# Patient Record
Sex: Male | Born: 1955
Health system: Southern US, Community
[De-identification: ages and names within clinical notes are randomized; demographics above are authoritative.]

## PROBLEM LIST (undated history)

## (undated) ENCOUNTER — Ambulatory Visit: Source: Home / Self Care

## (undated) DIAGNOSIS — I5031 Acute diastolic (congestive) heart failure: Secondary | ICD-10-CM

## (undated) DIAGNOSIS — M1990 Unspecified osteoarthritis, unspecified site: Secondary | ICD-10-CM

## (undated) DIAGNOSIS — D126 Benign neoplasm of colon, unspecified: Secondary | ICD-10-CM

## (undated) DIAGNOSIS — J439 Emphysema, unspecified: Secondary | ICD-10-CM

## (undated) DIAGNOSIS — E785 Hyperlipidemia, unspecified: Secondary | ICD-10-CM

## (undated) DIAGNOSIS — T7840XA Allergy, unspecified, initial encounter: Secondary | ICD-10-CM

## (undated) DIAGNOSIS — I4891 Unspecified atrial fibrillation: Secondary | ICD-10-CM

## (undated) DIAGNOSIS — I10 Essential (primary) hypertension: Secondary | ICD-10-CM

## (undated) DIAGNOSIS — G4730 Sleep apnea, unspecified: Secondary | ICD-10-CM

## (undated) HISTORY — PX: SPINE SURGERY: SHX786

## (undated) HISTORY — DX: Allergy, unspecified, initial encounter: T78.40XA

## (undated) HISTORY — PX: HERNIA REPAIR: SHX51

## (undated) HISTORY — DX: Unspecified atrial fibrillation: I48.91

## (undated) HISTORY — DX: Emphysema, unspecified: J43.9

## (undated) HISTORY — DX: Hyperlipidemia, unspecified: E78.5

## (undated) HISTORY — PX: WISDOM TOOTH EXTRACTION: SHX21

## (undated) HISTORY — DX: Sleep apnea, unspecified: G47.30

## (undated) HISTORY — DX: Unspecified osteoarthritis, unspecified site: M19.90

## (undated) HISTORY — PX: BACK SURGERY: SHX140

---
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
## 1997-09-02 ENCOUNTER — Emergency Department (HOSPITAL_COMMUNITY): Admission: EM | Admit: 1997-09-02 | Discharge: 1997-09-02 | Payer: Self-pay | Admitting: Emergency Medicine

## 1999-04-28 ENCOUNTER — Encounter: Payer: Self-pay | Admitting: Neurosurgery

## 1999-04-28 ENCOUNTER — Ambulatory Visit (HOSPITAL_COMMUNITY): Admission: RE | Admit: 1999-04-28 | Discharge: 1999-04-28 | Payer: Self-pay | Admitting: Neurosurgery

## 2003-02-12 ENCOUNTER — Inpatient Hospital Stay (HOSPITAL_COMMUNITY): Admission: RE | Admit: 2003-02-12 | Discharge: 2003-02-14 | Payer: Self-pay | Admitting: Neurosurgery

## 2009-05-21 ENCOUNTER — Emergency Department (HOSPITAL_COMMUNITY): Admission: EM | Admit: 2009-05-21 | Discharge: 2009-05-21 | Payer: Self-pay | Admitting: Emergency Medicine

## 2010-04-28 LAB — CBC
HCT: 48.5 % (ref 39.0–52.0)
MCHC: 34.8 g/dL (ref 30.0–36.0)
MCV: 96.3 fL (ref 78.0–100.0)
RBC: 5.03 MIL/uL (ref 4.22–5.81)

## 2010-04-28 LAB — DIFFERENTIAL
Basophils Relative: 0 % (ref 0–1)
Eosinophils Absolute: 0.2 10*3/uL (ref 0.0–0.7)
Eosinophils Relative: 2 % (ref 0–5)
Monocytes Relative: 8 % (ref 3–12)
Neutrophils Relative %: 67 % (ref 43–77)

## 2010-04-28 LAB — POCT CARDIAC MARKERS: Myoglobin, poc: 35.4 ng/mL (ref 12–200)

## 2010-04-28 LAB — BASIC METABOLIC PANEL
BUN: 13 mg/dL (ref 6–23)
CO2: 23 mEq/L (ref 19–32)
Chloride: 107 mEq/L (ref 96–112)
Creatinine, Ser: 0.7 mg/dL (ref 0.4–1.5)
Potassium: 4.1 mEq/L (ref 3.5–5.1)

## 2010-06-25 NOTE — Op Note (Signed)
NAME:  Mitchell Parrish, Mitchell Parrish                       ACCOUNT NO.:  [id number]   MEDICAL RECORD NO.:  [id number]                   PATIENT TYPE:  INP   LOCATION:  3172                                 FACILITY:  [facility]   PHYSICIAN:  Payton Doughty, M.D.                   DATE OF BIRTH:  Sep 05, 1955   DATE OF PROCEDURE:  02/12/2003  DATE OF DISCHARGE:                                 OPERATIVE REPORT   PREOPERATIVE DIAGNOSIS:  Spondylosis with myelopathy at C3-4, C4-5 and C5-6.   POSTOPERATIVE DIAGNOSIS:  Spondylosis with myelopathy at C3-4, C4-5 and C5-  6.   OPERATION PERFORMED:  C3-4, C4-5 and C5-6 anterior cervical decompression  and fusion with a tether plate.   SURGEON:  Payton Doughty, M.D.   ANESTHESIA:  General endotracheal.   PREP:  Sterile Betadine prep and scrub with alcohol wipe.   COMPLICATIONS:  None.   ASSISTANT:  1. Danae Orleans. Venetia Maxon, M.D.  2. [place]ts.   INDICATIONS FOR PROCEDURE:  The patient is a 55 year old gentleman with  cervical spondylitic myelopathy.   DESCRIPTION OF PROCEDURE:  The patient was taken to the operating room,  smoothly anesthetized, intubated, and placed supine on the operating table  in halter head traction.  Following shave, prep and drape in the in the  usual sterile fashion.  The skin was incised paralleling the  sternocleidomastoid muscle from the level of the carotid tubercle up to  nearly the angle of the jaw.  The platysma was identified, elevated, divided  and undermined.  Sternocleidomastoid muscle was then identified and medial  dissection revealed the carotid artery retracted laterally to the left,  trachea and esophagus retracted laterally to the right, exposing the bones  of the anterior cervical spine.  A marker was placed.  Intraoperative x-ray  obtained to confirm correctness of level.  Having confirmed correctness of  level, the longus colli was taken down bilaterally from C3 to C6 and the  Shadowline retractor placed.  While  protecting the esophagus, anterior  cervical decompression was carried out first under gross observation, then  using the operating microscope.  We brought in the operating microscope and  used the microdissection technique to complete resection of the disk at 3-4,  4-5 and 5-6 as well as remove the posterior longitudinal ligament and  explore both neural foramina bilaterally.  At 5-6 there was a central disk  which narrowed the canal.  At 3-4 and 4-5 there was spondylitic disease  slightly worse to the left than to the right.  Having completed  decompression, the wound was irrigated, hemostasis assured.  7 mm bone  grafts were fashioned from patellar allograft and tapped into place.  A 57  mm tether plate for three levels was then placed with two screws in C3, one  in C4, one in C5 and two in C6.  An intraoperative radiograph showed good  placement of bone grafts,  plates and screws.  The wound was once again  irrigated and hemostasis assured.  The platysma was reapproximated with 3-0  Vicryl in interrupted fashion.  Subcutaneous tissue reapproximated with 3-0 Vicryl in interrupted fashion.  Skin was closed with 4-0 Vicryl in a running subcuticular fashion.  Benzoin  and Steri-Strips were placed and made occlusive with Telfa and OpSite.  The  patient was then transferred to the recovery room in good condition.                                               Payton Doughty, M.D.    MWR/MEDQ  D:  02/12/2003  T:  02/13/2003  Job:  [id number]

## 2010-06-25 NOTE — H&P (Signed)
NAME:  Mitchell Parrish, Mitchell Parrish                       ACCOUNT NO.:  [id number]   MEDICAL RECORD NO.:  [id number]                   PATIENT TYPE:  INP   LOCATION:  3172                                 FACILITY:  [facility]   PHYSICIAN:  Payton Doughty, M.D.                   DATE OF BIRTH:  [birth year]   DATE OF ADMISSION:  02/12/2003  DATE OF DISCHARGE:                                HISTORY & PHYSICAL   ADMISSION DIAGNOSIS:  Spondylosis C3-C4, C4-C5, C5-C6.   SERVICE:  Neurosurgery   BODY OF TEXT:  This is a 55 year old right handed white gentleman who  underwent an anterior cervical discectomy and fusion at C6-C7 in 1993.  He  has done reasonably well.  Over the past couple of years, he has had some  increased pain in his neck and his left shoulder and arm.  MRI demonstrated  significant spondylitic disease and spinal stenosis at C3-C4 and C4-C5 and  C5-C6, he had a central and left sided narrowing.  He is now admitted for  fusion.   MEDICAL HISTORY:  Unremarkable for significant diseases.  He is allergic to  LODINE.  He has had an anterior cervical in the past at C6-C7 for surgical  history.   FAMILY HISTORY:  Noncontributory.   REVIEW OF SYMPTOMS:  Remarkable for neck and left shoulder pain.   SOCIAL HISTORY:  Drinks beer on occasion and smokes 1 1/2 packs cigarettes  per day.  He is a Naval architect.   PHYSICAL EXAMINATION:  HEENT:  Within normal limits.  NECK:  He has limited range of motion which causes neck pain.  LUNGS:  Clear.  HEART:  Regular rate and rhythm.  ABDOMEN:  Nontender, no hepatosplenomegaly.  EXTREMITIES:  No cyanosis or clubbing.  Peripheral pulses are good.  GU:  Deferred.  NEUROLOGICAL:  He is awake, alert, and oriented.  Cranial nerves are intact.  Motor exam shows 5/5 strength throughout the lower extremities.  The upper  extremities, he has full strength.  He has mildly facilitated reflexes of  the lower extremities.  Hoffman's is positive on the left side and  slightly  positive on the right.  There is no current sensory deficit.   CLINICAL IMPRESSION:  Cervical spondylosis with developing myelopathy.   PLAN:  Anterior cervical discectomy and fusion at C3-C4, C4-C5, and C5-C6.  The risks and benefits of this approach has been discussed with him and he  wishes to proceed.                                                Payton Doughty, M.D.    MWR/MEDQ  D:  02/12/2003  T:  02/12/2003  Job:  [id number]

## 2011-01-22 ENCOUNTER — Emergency Department (HOSPITAL_BASED_OUTPATIENT_CLINIC_OR_DEPARTMENT_OTHER)
Admission: EM | Admit: 2011-01-22 | Discharge: 2011-01-22 | Disposition: A | Discharge status: Home / Self Care | Payer: BC Managed Care – PPO | Attending: Emergency Medicine | Admitting: Emergency Medicine

## 2011-01-22 ENCOUNTER — Encounter: Payer: Self-pay | Admitting: *Deleted

## 2011-01-22 ENCOUNTER — Emergency Department (INDEPENDENT_AMBULATORY_CARE_PROVIDER_SITE_OTHER): Payer: BC Managed Care – PPO

## 2011-01-22 DIAGNOSIS — S6980XA Other specified injuries of unspecified wrist, hand and finger(s), initial encounter: Secondary | ICD-10-CM

## 2011-01-22 DIAGNOSIS — F17200 Nicotine dependence, unspecified, uncomplicated: Secondary | ICD-10-CM | POA: Insufficient documentation

## 2011-01-22 DIAGNOSIS — S6990XA Unspecified injury of unspecified wrist, hand and finger(s), initial encounter: Secondary | ICD-10-CM

## 2011-01-22 DIAGNOSIS — L02519 Cutaneous abscess of unspecified hand: Secondary | ICD-10-CM | POA: Insufficient documentation

## 2011-01-22 DIAGNOSIS — L0390 Cellulitis, unspecified: Secondary | ICD-10-CM

## 2011-01-22 DIAGNOSIS — X58XXXA Exposure to other specified factors, initial encounter: Secondary | ICD-10-CM

## 2011-01-22 HISTORY — DX: Essential (primary) hypertension: I10

## 2011-01-22 MED ORDER — CEPHALEXIN 500 MG PO CAPS: 500.0000 mg | ORAL_CAPSULE | Freq: Four times a day (QID) | ORAL | Status: AC

## 2011-01-22 MED ORDER — CEPHALEXIN 250 MG PO CAPS
500.0000 mg | ORAL_CAPSULE | Freq: Once | ORAL | Status: AC
  Administered 2011-01-22: 500 mg via ORAL
  Filled 2011-01-22: qty 2

## 2011-01-22 MED ORDER — HYDROCODONE-ACETAMINOPHEN 5-500 MG PO TABS: 1.0000 | ORAL_TABLET | Freq: Four times a day (QID) | ORAL | Status: AC | PRN

## 2011-01-22 NOTE — ED Notes (Signed)
Pt states he may have something in his right ring finger. Throbbing. Swollen. Red. Working on fence.

## 2011-01-22 NOTE — ED Provider Notes (Signed)
History     CSN: [id number] Arrival date & time: 01/22/2011  2:16 PM   First MD Initiated Contact with Patient 01/22/11 1423      Chief Complaint  Patient presents with  . Finger Injury    (Consider location/radiation/quality/duration/timing/severity/associated sxs/prior treatment) HPI Comments: Pt states that he grabbed the top of the fence last night and he is not sure if something is in the area:pt states that now the area is red and swollen and painful  Patient is a 55 y.o. male presenting with hand pain. The history is provided by the patient. No language interpreter was used.  Hand Pain This is a new problem. The current episode started yesterday. The problem occurs constantly. The problem has been gradually worsening. He has tried nothing for the symptoms.    Past Medical History  Diagnosis Date  . Hypertension     Past Surgical History  Procedure Date  . Back surgery   . Hernia repair     History reviewed. No pertinent family history.  History  Substance Use Topics  . Smoking status: Current Everyday Smoker  . Smokeless tobacco: Not on file  . Alcohol Use: Yes      Review of Systems  All other systems reviewed and are negative.    Allergies  Lodine and Percocet  Home Medications   Current Outpatient Rx  Name Route Sig Dispense Refill  . BISOPROLOL-HYDROCHLOROTHIAZIDE 10-6.25 MG PO TABS Oral Take 1 tablet by mouth daily.        BP 147/84  Pulse 84  Temp(Src) 97.9 F (36.6 C) (Oral)  Resp 20  Ht 5\' 11"  (1.803 m)  Wt 226 lb (102.513 kg)  BMI 31.52 kg/m2  SpO2 95%  Physical Exam  Nursing note and vitals reviewed. Constitutional: He is oriented to person, place, and time. He appears well-developed and well-nourished.  Cardiovascular: Normal rate and regular rhythm.   Pulmonary/Chest: Effort normal and breath sounds normal.  Musculoskeletal:       Pt has full rom of right ringer finger:cap refill <3 sec:no obvious fb noted  Neurological:  He is alert and oriented to person, place, and time.  Skin:       Pr has redness noted to the distal phalanx of the right ring finger with an superficial laceration noted to the area  Psychiatric: He has a normal mood and affect.    ED Course  Procedures (including critical care time)  Labs Reviewed - No data to display Dg Finger Ring Right  01/22/2011  *RADIOLOGY REPORT*  Clinical Data: Injury.  RIGHT RING FINGER 2+V  Comparison: None.  Findings: There is no evidence of fracture or dislocation.  There is no evidence of arthropathy or other focal bone abnormality. Soft tissues are unremarkable.  IMPRESSION: Negative exam.  Original Report Authenticated By: Rosealee Albee, M.D.     1. Cellulitis       MDM  No sing of fb noted:exam not consistent with a felon:will treat for cellulitis discussed follow up with pt        Teressa Lower, NP 01/22/11 1458

## 2011-01-22 NOTE — ED Provider Notes (Signed)
Medical screening examination/treatment/procedure(s) were performed by non-physician practitioner and as supervising physician I was immediately available for consultation/collaboration.  Iona Stay M Claretta Kendra, MD 01/22/11 2151 

## 2011-08-17 ENCOUNTER — Telehealth: Payer: Self-pay

## 2011-08-17 MED ORDER — BISOPROLOL-HYDROCHLOROTHIAZIDE 10-6.25 MG PO TABS: 1.0000 | ORAL_TABLET | Freq: Every day | ORAL | Status: DC

## 2011-08-17 NOTE — Telephone Encounter (Signed)
Yes.  Med sent.

## 2011-08-17 NOTE — Telephone Encounter (Signed)
PT HAS MADE AN APPT. TO REFILL HIS BP MEDS WITH DR Merla Riches ON 9/3 BUT WAS WONDERING IF WE CAN CALL HIM IN ENOUGH TO LAST HIM. HE USES THE [facility] [address]: [phone]

## 2011-08-17 NOTE — Telephone Encounter (Signed)
Pt.notified

## 2011-09-26 ENCOUNTER — Ambulatory Visit: Payer: PRIVATE HEALTH INSURANCE

## 2011-09-26 ENCOUNTER — Ambulatory Visit (INDEPENDENT_AMBULATORY_CARE_PROVIDER_SITE_OTHER): Payer: PRIVATE HEALTH INSURANCE | Admitting: Internal Medicine

## 2011-09-26 VITALS — BP 126/60 | HR 71 | Temp 98.1°F | Resp 18 | Ht 69.5 in | Wt 222.2 lb

## 2011-09-26 DIAGNOSIS — E669 Obesity, unspecified: Secondary | ICD-10-CM | POA: Insufficient documentation

## 2011-09-26 DIAGNOSIS — I10 Essential (primary) hypertension: Secondary | ICD-10-CM | POA: Insufficient documentation

## 2011-09-26 DIAGNOSIS — R1030 Lower abdominal pain, unspecified: Secondary | ICD-10-CM

## 2011-09-26 DIAGNOSIS — M25559 Pain in unspecified hip: Secondary | ICD-10-CM

## 2011-09-26 DIAGNOSIS — R109 Unspecified abdominal pain: Secondary | ICD-10-CM

## 2011-09-26 DIAGNOSIS — Z6832 Body mass index (BMI) 32.0-32.9, adult: Secondary | ICD-10-CM

## 2011-09-26 DIAGNOSIS — L989 Disorder of the skin and subcutaneous tissue, unspecified: Secondary | ICD-10-CM

## 2011-09-26 DIAGNOSIS — Z720 Tobacco use: Secondary | ICD-10-CM | POA: Insufficient documentation

## 2011-09-26 LAB — POCT URINALYSIS DIPSTICK
Bilirubin, UA: NEGATIVE
Blood, UA: NEGATIVE
Ketones, UA: NEGATIVE
Protein, UA: NEGATIVE
Spec Grav, UA: 1.02
pH, UA: 7

## 2011-09-26 LAB — POCT UA - MICROSCOPIC ONLY
Bacteria, U Microscopic: NEGATIVE
Crystals, Ur, HPF, POC: NEGATIVE
Epithelial cells, urine per micros: NEGATIVE
Mucus, UA: NEGATIVE
RBC, urine, microscopic: NEGATIVE
WBC, Ur, HPF, POC: NEGATIVE

## 2011-09-26 MED ORDER — MELOXICAM 15 MG PO TABS: 15.0000 mg | ORAL_TABLET | Freq: Every day | ORAL | Status: AC

## 2011-09-26 NOTE — Progress Notes (Signed)
VCO. Local anesthesia with 2% lidocaine with epinephrine.  Lesion on left elbow shaved flat. Hemostasis obtained with silver nitrate. Bandage applied. Patient tolerated well.

## 2011-09-26 NOTE — Progress Notes (Signed)
  Subjective:    Patient ID: Mitchell Parrish, male    DOB: 07-22-1955, 56 y.o.   MRN: [id number]  HPI    Review of Systems     Objective:   Physical Exam        Results for orders placed in visit on 09/26/11  POCT UA - MICROSCOPIC ONLY      Component Value Range   WBC, Ur, HPF, POC neg     RBC, urine, microscopic neg     Bacteria, U Microscopic neg     Mucus, UA neg     Epithelial cells, urine per micros neg     Crystals, Ur, HPF, POC neg     Casts, Ur, LPF, POC neg     Yeast, UA neg    POCT URINALYSIS DIPSTICK      Component Value Range   Color, UA yellow     Clarity, UA clear     Glucose, UA neg     Bilirubin, UA neg     Ketones, UA neg     Spec Grav, UA 1.020     Blood, UA neg     pH, UA 7.0     Protein, UA neg     Urobilinogen, UA 4.0     Nitrite, UA neg     Leukocytes, UA Negative      Assessment & Plan:
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
## 2011-09-27 DIAGNOSIS — L821 Other seborrheic keratosis: Secondary | ICD-10-CM | POA: Insufficient documentation

## 2011-10-03 ENCOUNTER — Encounter: Payer: Self-pay | Admitting: Internal Medicine

## 2011-10-12 ENCOUNTER — Encounter: Payer: Self-pay | Admitting: Internal Medicine

## 2011-11-02 ENCOUNTER — Encounter: Payer: Self-pay | Admitting: Internal Medicine

## 2011-11-02 ENCOUNTER — Ambulatory Visit (INDEPENDENT_AMBULATORY_CARE_PROVIDER_SITE_OTHER): Payer: PRIVATE HEALTH INSURANCE | Admitting: Internal Medicine

## 2011-11-02 VITALS — BP 138/76 | HR 70 | Temp 98.8°F | Resp 16 | Ht 69.0 in | Wt 228.8 lb

## 2011-11-02 DIAGNOSIS — Z0000 Encounter for general adult medical examination without abnormal findings: Secondary | ICD-10-CM

## 2011-11-02 DIAGNOSIS — R209 Unspecified disturbances of skin sensation: Secondary | ICD-10-CM

## 2011-11-02 DIAGNOSIS — I10 Essential (primary) hypertension: Secondary | ICD-10-CM

## 2011-11-02 DIAGNOSIS — M25559 Pain in unspecified hip: Secondary | ICD-10-CM

## 2011-11-02 DIAGNOSIS — F17200 Nicotine dependence, unspecified, uncomplicated: Secondary | ICD-10-CM

## 2011-11-02 DIAGNOSIS — R200 Anesthesia of skin: Secondary | ICD-10-CM

## 2011-11-02 DIAGNOSIS — R195 Other fecal abnormalities: Secondary | ICD-10-CM

## 2011-11-02 LAB — COMPREHENSIVE METABOLIC PANEL
Alkaline Phosphatase: 73 U/L (ref 39–117)
BUN: 20 mg/dL (ref 6–23)
CO2: 23 mEq/L (ref 19–32)
Glucose, Bld: 108 mg/dL — ABNORMAL HIGH (ref 70–99)
Sodium: 137 mEq/L (ref 135–145)
Total Bilirubin: 0.7 mg/dL (ref 0.3–1.2)
Total Protein: 6.5 g/dL (ref 6.0–8.3)

## 2011-11-02 LAB — CBC
HCT: 50.3 % (ref 39.0–52.0)
Hemoglobin: 17.8 g/dL — ABNORMAL HIGH (ref 13.0–17.0)
MCH: 32.8 pg (ref 26.0–34.0)
MCHC: 35.4 g/dL (ref 30.0–36.0)
MCV: 92.8 fL (ref 78.0–100.0)
RBC: 5.42 MIL/uL (ref 4.22–5.81)

## 2011-11-02 LAB — LIPID PANEL
Cholesterol: 234 mg/dL — ABNORMAL HIGH (ref 0–200)
Total CHOL/HDL Ratio: 4.7 Ratio
Triglycerides: 123 mg/dL (ref <150)
VLDL: 25 mg/dL (ref 0–40)

## 2011-11-02 MED ORDER — ALPRAZOLAM 0.5 MG PO TABS: 0.5000 mg | ORAL_TABLET | Freq: Every evening | ORAL | Status: DC | PRN

## 2011-11-02 MED ORDER — HYDROCODONE-ACETAMINOPHEN 5-325 MG PO TABS: 1.0000 | ORAL_TABLET | Freq: Four times a day (QID) | ORAL | Status: DC | PRN

## 2011-11-02 MED ORDER — BISOPROLOL-HYDROCHLOROTHIAZIDE 10-6.25 MG PO TABS: 1.0000 | ORAL_TABLET | Freq: Every day | ORAL | Status: DC
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
## 2011-11-03 ENCOUNTER — Encounter: Payer: Self-pay | Admitting: Internal Medicine

## 2011-11-03 LAB — PSA: PSA: 0.58 ng/mL (ref <=4.00)

## 2011-11-03 MED ORDER — ATORVASTATIN CALCIUM 10 MG PO TABS: 10.0000 mg | ORAL_TABLET | Freq: Every day | ORAL | Status: DC

## 2011-11-03 NOTE — Addendum Note (Signed)
Addended by: Tonye Pearson on: 11/03/2011 10:31 PM   Modules accepted: Orders

## 2011-11-04 ENCOUNTER — Encounter: Payer: Self-pay | Admitting: Gastroenterology

## 2011-11-08 DIAGNOSIS — D126 Benign neoplasm of colon, unspecified: Secondary | ICD-10-CM

## 2011-11-08 HISTORY — DX: Benign neoplasm of colon, unspecified: D12.6

## 2011-11-09 ENCOUNTER — Ambulatory Visit (INDEPENDENT_AMBULATORY_CARE_PROVIDER_SITE_OTHER): Payer: Self-pay | Admitting: Gastroenterology

## 2011-11-09 ENCOUNTER — Encounter: Payer: Self-pay | Admitting: Gastroenterology

## 2011-11-09 VITALS — BP 114/70 | HR 64 | Ht 70.0 in | Wt 231.2 lb

## 2011-11-09 DIAGNOSIS — R195 Other fecal abnormalities: Secondary | ICD-10-CM

## 2011-11-09 DIAGNOSIS — K5900 Constipation, unspecified: Secondary | ICD-10-CM

## 2011-11-09 MED ORDER — MOVIPREP 100 G PO SOLR: 1.0000 | Freq: Once | ORAL | Status: DC

## 2011-11-09 NOTE — Progress Notes (Signed)
History of Present Illness: This is a 56 year old male who was recently found to have occult blood in his stool. He notes having mild problems with constipation since taking narcotics for pain control. He occasionally notes scant amounts of bright red blood per rectum. His father was diagnosed with either anal or rectal cancer at age 5. Denies weight loss, abdominal pain, constipation, diarrhea, change in stool caliber, melena, hematochezia, nausea, vomiting, dysphagia, reflux symptoms, chest pain.  Review of Systems: Pertinent positive and negative review of systems were noted in the above HPI section. All other review of systems were otherwise negative.  Current Medications, Allergies, Past Medical History, Past Surgical History, Family History and Social History were reviewed in [employer] record.  Physical Exam: General: Well developed , well nourished, no acute distress Head: Normocephalic and atraumatic Eyes:  sclerae anicteric, EOMI Ears: Normal auditory acuity Mouth: No deformity or lesions Neck: Supple, no masses or thyromegaly Lungs: Clear throughout to auscultation Heart: Regular rate and rhythm; no murmurs, rubs or bruits Abdomen: Soft, non tender and non distended. No masses, hepatosplenomegaly or hernias noted. Normal Bowel sounds Rectal: Deferred to colonoscopy Musculoskeletal: Symmetrical with no gross deformities  Skin: No lesions on visible extremities Pulses:  Normal pulses noted Extremities: No clubbing, cyanosis, edema or deformities noted Neurological: Alert oriented x 4, grossly nonfocal Cervical Nodes:  No significant cervical adenopathy Inguinal Nodes: No significant inguinal adenopathy Psychological:  Alert and cooperative. Normal mood and affect  Assessment and Recommendations:  1. Occult blood in stool, small-volume hematochezia, constipation, family history of rectal or anal cancer in his father at age 75. Increase dietary fiber and  water intake. Schedule colonoscopy. The risks, benefits, and alternatives to colonoscopy with possible biopsy and possible polypectomy were discussed with the patient and they consent to proceed.

## 2011-11-09 NOTE — Patient Instructions (Addendum)
You have been given a separate informational sheet regarding your tobacco use, the importance of quitting and local resources to help you quit.  You have been scheduled for a colonoscopy with propofol. Please follow written instructions given to you at your visit today.  Please pick up your prep kit at the pharmacy within the next 1-3 days. If you use inhalers (even only as needed), please bring them with you on the day of your procedure.  We have sent the following medications to your pharmacy for you to pick up at your convenience:Moviprep  cc: Ellamae Sia, MD

## 2011-11-15 ENCOUNTER — Other Ambulatory Visit: Payer: Self-pay | Admitting: Internal Medicine

## 2011-11-16 NOTE — Telephone Encounter (Signed)
At Tl desk 

## 2011-11-17 ENCOUNTER — Other Ambulatory Visit: Payer: Self-pay | Admitting: *Deleted

## 2011-11-24 ENCOUNTER — Encounter: Payer: Self-pay | Admitting: Gastroenterology

## 2011-11-24 ENCOUNTER — Ambulatory Visit (AMBULATORY_SURGERY_CENTER): Payer: PRIVATE HEALTH INSURANCE | Admitting: Gastroenterology

## 2011-11-24 VITALS — BP 123/74 | HR 53 | Temp 97.4°F | Resp 18 | Ht 70.0 in | Wt 231.0 lb

## 2011-11-24 DIAGNOSIS — D126 Benign neoplasm of colon, unspecified: Secondary | ICD-10-CM

## 2011-11-24 DIAGNOSIS — K5900 Constipation, unspecified: Secondary | ICD-10-CM

## 2011-11-24 DIAGNOSIS — R195 Other fecal abnormalities: Secondary | ICD-10-CM

## 2011-11-24 MED ORDER — SODIUM CHLORIDE 0.9 % IV SOLN: 500.0000 mL | INTRAVENOUS | Status: DC

## 2011-11-24 NOTE — Op Note (Signed)
 [facility] [address] [place], [address]   COLONOSCOPY PROCEDURE REPORT  PATIENT: Mitchell Parrish, Mitchell Parrish  MR#: [id number] BIRTHDATE: 1955/07/05 , 56  yrs. old GENDER: Male ENDOSCOPIST: Meryl Dare, MD, [facility] REFERRED XB:JYNWGN Merla Riches, M.D. PROCEDURE DATE:  11/24/2011 PROCEDURE:   Colonoscopy with snare polypectomy and Colonoscopy with biopsy ASA CLASS:   Class II INDICATIONS:heme-positive stool. MEDICATIONS: MAC sedation, administered by CRNA and propofol (Diprivan) 240mg  IV DESCRIPTION OF PROCEDURE:   After the risks benefits and alternatives of the procedure were thoroughly explained, informed consent was obtained.  A digital rectal exam revealed no abnormalities of the rectum.   The LB CF-[id number]  endoscope was introduced through the anus and advanced to the cecum, which was identified by both the appendix and ileocecal valve. No adverse events experienced.   The quality of the prep was good, using MoviPrep  The instrument was then slowly withdrawn as the colon was fully examined.   COLON FINDINGS: A sessile polyp measuring 6 mm in size was found in the transverse colon.  A polypectomy was performed with a cold snare.  The resection was complete and the polyp tissue was completely retrieved.   A sessile polyp measuring 4 mm in size was found in the transverse colon.  A polypectomy was performed with cold forceps.  The resection was complete and the polyp tissue was completely retrieved.   The colon was otherwise normal.  There was no diverticulosis, inflammation, polyps or cancers unless previously stated.  Retroflexed views revealed small internal hemorrhoids. The time to cecum=1 minutes 51 seconds.  Withdrawal time=11 minutes 18 seconds.  The scope was withdrawn and the procedure completed. COMPLICATIONS: There were no complications.  ENDOSCOPIC IMPRESSION: 1.   Sessile polyp measuring 6 mm in size in the transverse colon; polypectomy  performed with a cold snare 2.   Sessile polyp measuring 4 mm in size in the transverse colon; polypectomy performed with cold forceps 3.   Small internal hemorrhoids  RECOMMENDATIONS: 1.  Await pathology results 2.  Repeat colonoscopy in 5 years if polyp adenomatous; otherwise 10 years  eSigned:  Meryl Dare, MD, [facility] 11/24/2011 12:09 PM

## 2011-11-24 NOTE — Progress Notes (Signed)
Patient did not have preoperative order for IV antibiotic SSI prophylaxis. ([id number])   

## 2011-11-25 ENCOUNTER — Telehealth: Payer: Self-pay

## 2011-11-25 NOTE — Telephone Encounter (Signed)
  Follow up Call-  Call back number 11/24/2011  Post procedure Call Back phone  # [phone]  Permission to leave phone message Yes     Patient questions:  Do you have a fever, pain , or abdominal swelling? no Pain Score  0 *  Have you tolerated food without any problems? yes  Have you been able to return to your normal activities? yes  Do you have any questions about your discharge instructions: Diet   no Medications  no Follow up visit  no  Do you have questions or concerns about your Care? no  Actions: * If pain score is 4 or above: No action needed, pain <4.  Per the pt, "No problems at all". Maw

## 2011-11-30 ENCOUNTER — Encounter: Payer: Self-pay | Admitting: Gastroenterology

## 2011-12-05 ENCOUNTER — Encounter: Payer: Self-pay | Admitting: Family Medicine

## 2011-12-05 DIAGNOSIS — L821 Other seborrheic keratosis: Secondary | ICD-10-CM

## 2012-03-15 ENCOUNTER — Encounter: Payer: Self-pay | Admitting: *Deleted

## 2012-03-15 DIAGNOSIS — R200 Anesthesia of skin: Secondary | ICD-10-CM | POA: Insufficient documentation

## 2012-04-28 ENCOUNTER — Other Ambulatory Visit: Payer: Self-pay | Admitting: Internal Medicine

## 2012-04-30 MED ORDER — ATORVASTATIN CALCIUM 10 MG PO TABS: 10.0000 mg | ORAL_TABLET | Freq: Every day | ORAL | Status: DC

## 2012-04-30 NOTE — Telephone Encounter (Signed)
RFd Lipitor for 1 mos w/note NEEDS OV. Also LMOM for pt that he is overdue for f/up/labs and needs to come in before any more RFs are needed.

## 2012-04-30 NOTE — Telephone Encounter (Signed)
Was started on lipitor after last labs and asked to f/u 3 mos for repeat labs and failed to do so Allow one month refill xanax at hs and be sure he is taking lipitor 10 or refill it one month and require labs in one month

## 2012-05-10 ENCOUNTER — Encounter: Payer: Self-pay | Admitting: Internal Medicine

## 2012-10-30 ENCOUNTER — Ambulatory Visit (INDEPENDENT_AMBULATORY_CARE_PROVIDER_SITE_OTHER): Payer: PRIVATE HEALTH INSURANCE | Admitting: Internal Medicine

## 2012-10-30 VITALS — BP 108/78 | HR 64 | Temp 97.8°F | Resp 16 | Ht 67.25 in | Wt 228.4 lb

## 2012-10-30 DIAGNOSIS — E785 Hyperlipidemia, unspecified: Secondary | ICD-10-CM

## 2012-10-30 DIAGNOSIS — Z6832 Body mass index (BMI) 32.0-32.9, adult: Secondary | ICD-10-CM

## 2012-10-30 DIAGNOSIS — I10 Essential (primary) hypertension: Secondary | ICD-10-CM

## 2012-10-30 DIAGNOSIS — M5030 Other cervical disc degeneration, unspecified cervical region: Secondary | ICD-10-CM

## 2012-10-30 DIAGNOSIS — G8929 Other chronic pain: Secondary | ICD-10-CM | POA: Insufficient documentation

## 2012-10-30 DIAGNOSIS — G4700 Insomnia, unspecified: Secondary | ICD-10-CM

## 2012-10-30 DIAGNOSIS — E782 Mixed hyperlipidemia: Secondary | ICD-10-CM | POA: Insufficient documentation

## 2012-10-30 MED ORDER — ALPRAZOLAM 0.5 MG PO TABS: ORAL_TABLET | ORAL | Status: DC

## 2012-10-30 MED ORDER — BISOPROLOL-HYDROCHLOROTHIAZIDE 10-6.25 MG PO TABS: 1.0000 | ORAL_TABLET | Freq: Every day | ORAL | Status: DC

## 2012-10-30 MED ORDER — ATORVASTATIN CALCIUM 10 MG PO TABS: 10.0000 mg | ORAL_TABLET | Freq: Every day | ORAL | Status: DC

## 2012-10-30 NOTE — Progress Notes (Signed)
  Subjective:    Patient ID: Mitchell Parrish, male    DOB: 1955/04/15, 57 y.o.   MRN: [id number]  HPIf/u Patient Active Problem List   Diagnosis Date Noted  . Other and unspecified hyperlipidemia 10/30/2012  . Hip pain, chronic 10/30/2012  . Degeneration of cervical intervertebral disc 10/30/2012  . Insomnia 10/30/2012  . Numbness of left anterior thigh 03/15/2012  . Seborrheic keratosis 09/27/2011  . Hypertension 09/26/2011  . BMI 32.0-32.9,adult 09/26/2011  . Gilbert's syndrome 09/26/2011  . 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(he listed the contr subst--he is aware not to use while driving  Review of Systems Still smoker/better diet/?losing wt No CP,palpit No DOE No edema     Objective:   Physical Exam BP 108/78  Pulse 64  Temp(Src) 97.8 F (36.6 C) (Oral)  Resp 16  Ht 5' 7.25" (1.708 m)  Wt 228 lb 6.4 oz (103.602 kg)  BMI 35.51 kg/m2  SpO2 97% HEENT clear Heart regular No edema     Assessment & Plan:  Hypertension  BMI 32.0-32.9,adult  Other and unspecified hyperlipidemia  Hip pain, chronic, left//degen prob knees/ LBP  Degeneration of cervical intervertebral disc s/p surgery  Insomnia  Meds ordered this encounter  Medications  . bisoprolol-hydrochlorothiazide (ZIAC) 10-6.25 MG per tablet    Sig: Take 1 tablet by mouth daily.    Dispense:  90 tablet    Refill:  0  . ALPRAZolam (XANAX) 0.5 MG tablet    Sig: take 1 tablet by mouth at bedtime if needed for sleep    Dispense:  30 tablet    Refill:  0  . atorvastatin (LIPITOR) 10 MG tablet    Sig: Take 1 tablet (10 mg total) by mouth daily.   Dispense:  30 tablet    Refill:  2    PATIENT NEEDS OFFICE VISIT/LABS FOR ADD'L REFILLS   Will set up appointment 8 weeks for CPE and labs-will need another provider as I'm booked til DEC

## 2012-10-31 NOTE — Progress Notes (Signed)
CPE appt made with Ryan for 11/19/12.

## 2012-11-19 ENCOUNTER — Encounter: Payer: Self-pay | Admitting: Physician Assistant

## 2012-11-19 ENCOUNTER — Ambulatory Visit (INDEPENDENT_AMBULATORY_CARE_PROVIDER_SITE_OTHER): Payer: PRIVATE HEALTH INSURANCE | Admitting: Physician Assistant

## 2012-11-19 ENCOUNTER — Other Ambulatory Visit: Payer: Self-pay | Admitting: Physician Assistant

## 2012-11-19 VITALS — BP 125/80 | HR 66 | Temp 98.0°F | Resp 16 | Ht 70.0 in | Wt 226.0 lb

## 2012-11-19 DIAGNOSIS — G4700 Insomnia, unspecified: Secondary | ICD-10-CM

## 2012-11-19 DIAGNOSIS — M25559 Pain in unspecified hip: Secondary | ICD-10-CM

## 2012-11-19 DIAGNOSIS — R17 Unspecified jaundice: Secondary | ICD-10-CM

## 2012-11-19 DIAGNOSIS — G8929 Other chronic pain: Secondary | ICD-10-CM

## 2012-11-19 DIAGNOSIS — F17200 Nicotine dependence, unspecified, uncomplicated: Secondary | ICD-10-CM

## 2012-11-19 DIAGNOSIS — Z0000 Encounter for general adult medical examination without abnormal findings: Secondary | ICD-10-CM

## 2012-11-19 DIAGNOSIS — R319 Hematuria, unspecified: Secondary | ICD-10-CM

## 2012-11-19 LAB — CBC
HCT: 49.9 % (ref 39.0–52.0)
Hemoglobin: 18 g/dL — ABNORMAL HIGH (ref 13.0–17.0)
MCH: 32.4 pg (ref 26.0–34.0)
MCHC: 36.1 g/dL — ABNORMAL HIGH (ref 30.0–36.0)
MCV: 89.9 fL (ref 78.0–100.0)
Platelets: 214 10*3/uL (ref 150–400)
RBC: 5.55 MIL/uL (ref 4.22–5.81)
RDW: 13.2 % (ref 11.5–15.5)
WBC: 8.8 10*3/uL (ref 4.0–10.5)

## 2012-11-19 LAB — POCT URINALYSIS DIPSTICK
Leukocytes, UA: NEGATIVE
Nitrite, UA: NEGATIVE
Protein, UA: NEGATIVE
Spec Grav, UA: >=1.03
pH, UA: 6

## 2012-11-19 LAB — COMPREHENSIVE METABOLIC PANEL
AST: 23 U/L (ref 0–37)
Albumin: 3.9 g/dL (ref 3.5–5.2)
Alkaline Phosphatase: 65 U/L (ref 39–117)
Chloride: 106 mEq/L (ref 96–112)
Potassium: 4 mEq/L (ref 3.5–5.3)
Sodium: 138 mEq/L (ref 135–145)
Total Bilirubin: 2.1 mg/dL — ABNORMAL HIGH (ref 0.3–1.2)
Total Protein: 6.5 g/dL (ref 6.0–8.3)

## 2012-11-19 LAB — LIPID PANEL
Cholesterol: 171 mg/dL (ref 0–200)
HDL: 49 mg/dL
LDL Cholesterol: 83 mg/dL (ref 0–99)
Total CHOL/HDL Ratio: 3.5 ratio
Triglycerides: 196 mg/dL — ABNORMAL HIGH
VLDL: 39 mg/dL (ref 0–40)

## 2012-11-19 LAB — POCT UA - MICROSCOPIC ONLY
Bacteria, U Microscopic: NEGATIVE
Casts, Ur, LPF, POC: NEGATIVE
Crystals, Ur, HPF, POC: NEGATIVE
Yeast, UA: NEGATIVE

## 2012-11-19 LAB — TSH: TSH: 0.783 u[IU]/mL (ref 0.350–4.500)

## 2012-11-19 MED ORDER — ALPRAZOLAM 1 MG PO TABS: 1.0000 mg | ORAL_TABLET | Freq: Every evening | ORAL | Status: DC | PRN

## 2012-11-19 MED ORDER — HYDROCODONE-ACETAMINOPHEN 5-325 MG PO TABS: 1.0000 | ORAL_TABLET | ORAL | Status: DC | PRN
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
## 2012-11-20 NOTE — Addendum Note (Signed)
Addended by: Sondra Barges on: 11/20/2012 09:05 AM   Modules accepted: Orders

## 2012-11-23 ENCOUNTER — Telehealth: Payer: Self-pay | Admitting: Family Medicine

## 2012-11-23 NOTE — Telephone Encounter (Signed)
Spoke with kim from [facility] she states that she will add this on and that she will find out why it was done

## 2012-11-24 LAB — BILIRUBIN,DIRECT & INDIRECT (FRACTIONATED): Bilirubin, Direct: 0.3 mg/dL (ref 0.0–0.3)

## 2012-11-24 LAB — BILIRUBIN, TOTAL: Total Bilirubin: 2.1 mg/dL — ABNORMAL HIGH (ref 0.3–1.2)

## 2012-11-27 NOTE — Telephone Encounter (Signed)
Pt called back and reported he had been speaking to someone about his labs and had further questions. Checked w/Ryan who instr'd pt does not need to come in for repeat UA since he has an appt w/urologist on 12/03/12. Alycia Rossetti does still want pt to come for f/up with him in about 2 weeks and I transferred pt to appt center to set up for 12/10/12. Advised pt we will be back in touch w/him when we get the fractionated bilirubin test back. Pt agreed.

## 2012-12-10 ENCOUNTER — Encounter: Payer: Self-pay | Admitting: Physician Assistant

## 2012-12-10 ENCOUNTER — Ambulatory Visit (INDEPENDENT_AMBULATORY_CARE_PROVIDER_SITE_OTHER): Payer: PRIVATE HEALTH INSURANCE | Admitting: Physician Assistant

## 2012-12-10 VITALS — BP 144/80 | HR 90 | Temp 98.0°F | Resp 16 | Ht 70.0 in | Wt 230.0 lb

## 2012-12-10 DIAGNOSIS — M549 Dorsalgia, unspecified: Secondary | ICD-10-CM

## 2012-12-10 DIAGNOSIS — E785 Hyperlipidemia, unspecified: Secondary | ICD-10-CM

## 2012-12-10 DIAGNOSIS — R17 Unspecified jaundice: Secondary | ICD-10-CM

## 2012-12-10 DIAGNOSIS — R319 Hematuria, unspecified: Secondary | ICD-10-CM

## 2012-12-10 LAB — COMPREHENSIVE METABOLIC PANEL
Albumin: 4.1 g/dL (ref 3.5–5.2)
Alkaline Phosphatase: 57 U/L (ref 39–117)
BUN: 17 mg/dL (ref 6–23)
Creat: 0.83 mg/dL (ref 0.50–1.35)
Glucose, Bld: 92 mg/dL (ref 70–99)
Potassium: 3.9 mEq/L (ref 3.5–5.3)
Sodium: 139 mEq/L (ref 135–145)
Total Bilirubin: 0.9 mg/dL (ref 0.3–1.2)

## 2012-12-10 NOTE — Progress Notes (Signed)
Patient ID: AEON KESSNER MRN: [id number], DOB: 16-Oct-1955, 57 y.o. Date of Encounter: 12/10/2012, 2:05 PM  Primary Physician: Tonye Pearson, MD  Chief Complaint: Follow up  HPI: 57 y.o. male with history below presents for follow up of elevated bilirubin that was drawn on 11/19/12. Fractionated bilirubin revealed an indirect bilirubin of 1.8 and a direct of 0.3. Remaining LFT's were normal at that time. Total bilirubin on 11/02/11 was 0.7. On his problem list he does have history of Gilbert's syndrome. Patient states that he drank a lot the weekend prior to his appointment on 11/19/12. He requests to stop his Lipitor starting that at the time he was started on this his diet consisted of nothing but fried foods. He no longer eats fried foods except a rare fried chicken. He is starting to exercise regularly. When his blood work was drawn on 11/19/12 he had been out of his Lipitor for the previous 6 months and had only restarted this for about 2.5-3 weeks prior.    Patient saw the Urologist on 12/03/12 for 0-4 RBC per HPF and history of tobacco use x 40 pack years and states there was no blood in his UA at their office. He was started on Flomax 0.4 mg qhs for urinary frequency and has a follow up in 2 months.    Past Medical History  Diagnosis Date  . Hypertension   . Arthritis     left hip  . Hyperlipidemia      Home Meds: Prior to Admission medications   Medication Sig Start Date End Date Taking? Authorizing Provider  ALPRAZolam Prudy Feeler) 1 MG tablet Take 1 tablet (1 mg total) by mouth at bedtime as needed for sleep. 11/19/12   Sondra Barges, PA-C  aspirin 81 MG tablet Take 81 mg by mouth daily.    Historical Provider, MD  atorvastatin (LIPITOR) 10 MG tablet Take 1 tablet (10 mg total) by mouth daily. 10/30/12   Tonye Pearson, MD  bisoprolol-hydrochlorothiazide [facility]) 10-6.25 MG per tablet Take 1 tablet by mouth daily. 10/30/12   Tonye Pearson, MD  HYDROcodone-acetaminophen  (NORCO/VICODIN) 5-325 MG per tablet Take 1 tablet by mouth every 4 (four) hours as needed for pain. 11/19/12   Sondra Barges, PA-C    Allergies:  Allergies  Allergen Reactions  . Lodine [Etodolac] Hives  . Percocet [Oxycodone-Acetaminophen] Swelling    History   Social History  . Marital Status: Married    Spouse Name: N/A    Number of Children: 4  . Years of Education: N/A   Occupational History  . TRUCK DRIVER    Social History Main Topics  . Smoking status: Current Every Day Smoker -- 1.00 packs/day for 45 years    Types: Cigarettes  . Smokeless tobacco: Never Used  . Alcohol Use: Yes     Comment: 2-3 drinks/ day  . Drug Use: No  . Sexual Activity: Yes   Other Topics Concern  . Not on file   Social History Narrative  . No narrative on file     Review of Systems: Constitutional: negative for chills, fever, or fatigue  HEENT: negative for vision changes or hearing loss Cardiovascular: negative for chest pain or palpitations Respiratory: negative for hemoptysis, wheezing, shortness of breath, or cough Abdominal: negative for abdominal pain, nausea, vomiting, diarrhea, or constipation Dermatological: negative for rash Neurologic: negative for headache, dizziness, or syncope   Physical Exam: Blood pressure 144/80, pulse 90, temperature 98 F (36.7 C), temperature source  Oral, resp. rate 16, height 5\' 10"  (1.778 m), weight 230 lb (104.327 kg), SpO2 95.00%., Body mass index is 33 kg/(m^2). General: Well developed, well nourished, in no acute distress. Head: Normocephalic, atraumatic, eyes without discharge, sclera non-icteric, nares are without discharge. Bilateral auditory canals clear, TM's are without perforation, pearly grey and translucent with reflective cone of light bilaterally. Oral cavity moist, posterior pharynx without exudate, erythema, peritonsillar abscess, or post nasal drip.  Neck: Supple. No thyromegaly. Full ROM. No lymphadenopathy. Lungs: Clear  bilaterally to auscultation without wheezes, rales, or rhonchi. Breathing is unlabored. Heart: RRR with S1 S2. No murmurs, rubs, or gallops appreciated. Abdomen: Soft, non-tender, non-distended with normoactive bowel sounds. No hepatosplenomegaly. No rebound/guarding. No obvious abdominal masses. Msk:  Strength and tone normal for age. Extremities/Skin: Warm and dry. No clubbing or cyanosis. No edema. No rashes or suspicious lesions. Neuro: Alert and oriented X 3. Moves all extremities spontaneously. Gait is normal. CNII-XII grossly in tact. Psych:  Responds to questions appropriately with a normal affect.   Labs: Repeat CMP and fractionated bilirubin.   ASSESSMENT AND PLAN:  57 y.o. male with and hematuria  1) Elevated bilirubin -Await labs -Further evaluation and treatment pending -Continue to abstain from drinking alcohol -History of Gilbert's Syndrome on problem list  2) Hematuria in the setting of 40 pack years of tobacco use -Follow up with Urology -Will recheck a UA at his follow up -Continue Flomax as directed by Urology  3) Hyperlipidemia -Deal made with patient that he can stop his Lipitor at this time and recheck a FLP in 4 months time with strict lifestyle changes. He agrees to this. I have advised him that it is his life, he understands this.  -He was off his Lipitor for 6 months and had only restarted this 2.5-3 weeks prior to rechecking his FLP on 11/19/12 thus his lipids were somewhat secondary to lifestyle changes.  -He must stop smoking  4) Long term back pain -Ok to refill medication when needed  Signed, Eula Listen, PA-C [facility] [place], [place] [address] [phone] 12/10/2012 2:05 PM

## 2012-12-31 ENCOUNTER — Other Ambulatory Visit: Payer: Self-pay | Admitting: Physician Assistant

## 2012-12-31 DIAGNOSIS — G4700 Insomnia, unspecified: Secondary | ICD-10-CM

## 2013-01-04 ENCOUNTER — Other Ambulatory Visit: Payer: Self-pay | Admitting: Radiology

## 2013-01-07 ENCOUNTER — Telehealth: Payer: Self-pay

## 2013-01-07 DIAGNOSIS — G8929 Other chronic pain: Secondary | ICD-10-CM

## 2013-01-07 MED ORDER — HYDROCODONE-ACETAMINOPHEN 5-325 MG PO TABS: 1.0000 | ORAL_TABLET | ORAL | Status: DC | PRN

## 2013-01-07 NOTE — Telephone Encounter (Signed)
Patient needs refill on Vicodin  [phone]

## 2013-01-07 NOTE — Telephone Encounter (Signed)
Medication refilled, printed, signed, and at the TL desk.  

## 2013-01-08 NOTE — Telephone Encounter (Signed)
Pt notified that rx is ready for pick up

## 2013-02-03 ENCOUNTER — Other Ambulatory Visit: Payer: Self-pay | Admitting: Internal Medicine

## 2013-05-18 ENCOUNTER — Other Ambulatory Visit: Payer: Self-pay | Admitting: Physician Assistant

## 2013-07-19 ENCOUNTER — Encounter (HOSPITAL_COMMUNITY): Payer: Self-pay | Admitting: Emergency Medicine

## 2013-07-19 ENCOUNTER — Emergency Department (HOSPITAL_COMMUNITY)
Admission: EM | Admit: 2013-07-19 | Discharge: 2013-07-20 | Disposition: A | Discharge status: Home / Self Care | Payer: PRIVATE HEALTH INSURANCE | Attending: Emergency Medicine | Admitting: Emergency Medicine

## 2013-07-19 DIAGNOSIS — R55 Syncope and collapse: Secondary | ICD-10-CM | POA: Insufficient documentation

## 2013-07-19 DIAGNOSIS — G8929 Other chronic pain: Secondary | ICD-10-CM | POA: Insufficient documentation

## 2013-07-19 DIAGNOSIS — I10 Essential (primary) hypertension: Secondary | ICD-10-CM | POA: Insufficient documentation

## 2013-07-19 DIAGNOSIS — E860 Dehydration: Secondary | ICD-10-CM | POA: Insufficient documentation

## 2013-07-19 DIAGNOSIS — M1610 Unilateral primary osteoarthritis, unspecified hip: Secondary | ICD-10-CM | POA: Insufficient documentation

## 2013-07-19 DIAGNOSIS — F10929 Alcohol use, unspecified with intoxication, unspecified: Secondary | ICD-10-CM

## 2013-07-19 DIAGNOSIS — Z7982 Long term (current) use of aspirin: Secondary | ICD-10-CM | POA: Insufficient documentation

## 2013-07-19 DIAGNOSIS — D72829 Elevated white blood cell count, unspecified: Secondary | ICD-10-CM | POA: Insufficient documentation

## 2013-07-19 DIAGNOSIS — F17200 Nicotine dependence, unspecified, uncomplicated: Secondary | ICD-10-CM | POA: Insufficient documentation

## 2013-07-19 DIAGNOSIS — F1010 Alcohol abuse, uncomplicated: Secondary | ICD-10-CM | POA: Insufficient documentation

## 2013-07-19 DIAGNOSIS — Z79899 Other long term (current) drug therapy: Secondary | ICD-10-CM | POA: Insufficient documentation
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
## 2013-07-20 ENCOUNTER — Emergency Department (HOSPITAL_COMMUNITY): Payer: PRIVATE HEALTH INSURANCE

## 2013-07-20 LAB — CBC
HCT: 44.4 % (ref 39.0–52.0)
HEMOGLOBIN: 14.8 g/dL (ref 13.0–17.0)
MCH: 32.4 pg (ref 26.0–34.0)
MCHC: 33.3 g/dL (ref 30.0–36.0)
MCV: 97.2 fL (ref 78.0–100.0)
Platelets: 161 10*3/uL (ref 150–400)
RBC: 4.57 MIL/uL (ref 4.22–5.81)
RDW: 14.1 % (ref 11.5–15.5)
WBC: 13.8 10*3/uL — ABNORMAL HIGH (ref 4.0–10.5)

## 2013-07-20 LAB — COMPREHENSIVE METABOLIC PANEL
ALT: 22 U/L (ref 0–53)
AST: 17 U/L (ref 0–37)
Albumin: 3 g/dL — ABNORMAL LOW (ref 3.5–5.2)
Alkaline Phosphatase: 56 U/L (ref 39–117)
BUN: 13 mg/dL (ref 6–23)
CALCIUM: 7.7 mg/dL — AB (ref 8.4–10.5)
CO2: 22 meq/L (ref 19–32)
CREATININE: 0.95 mg/dL (ref 0.50–1.35)
Chloride: 106 mEq/L (ref 96–112)
Glucose, Bld: 127 mg/dL — ABNORMAL HIGH (ref 70–99)
Potassium: 3.6 mEq/L — ABNORMAL LOW (ref 3.7–5.3)
SODIUM: 142 meq/L (ref 137–147)
Total Bilirubin: 0.7 mg/dL (ref 0.3–1.2)
Total Protein: 5.8 g/dL — ABNORMAL LOW (ref 6.0–8.3)

## 2013-07-20 LAB — TROPONIN I

## 2013-07-20 LAB — ETHANOL: ALCOHOL ETHYL (B): 60 mg/dL — AB (ref 0–11)

## 2013-07-20 MED ORDER — SODIUM CHLORIDE 0.9 % IV BOLUS (SEPSIS)
1000.0000 mL | Freq: Once | INTRAVENOUS | Status: AC
  Administered 2013-07-20: 1000 mL via INTRAVENOUS

## 2013-07-20 NOTE — ED Notes (Signed)
X-ray at bedside

## 2013-07-20 NOTE — ED Notes (Signed)
Phlebotomy at bedside.

## 2013-07-20 NOTE — ED Provider Notes (Signed)
CSN: [id number]     Arrival date & time 07/19/13  2308 History   First MD Initiated Contact with Patient 07/20/13 0016     Chief Complaint  Patient presents with  . Loss of Consciousness  . Hypotension     (Consider location/radiation/quality/duration/timing/severity/associated sxs/prior Treatment) HPI Patient is a 58 yo man with a history of well controlled HTN, chronic neck pain and HLD. He is BIB EMS from home after a witnessed syncopal spell. The patient was sitting in a chair outside in the yard of his house with his family. He felt lightheaded and bent his head between his knees. He did not recall having lost conciousness. Family tells EMS that the patient was unresponsive for a period of seconds. He did not fall from the chair.   The patient says he had 6 beers prior to this incident. In addition, he drives a truck, delivers and unloads lumbar. Spent all day doing this today and says he did not drink enough water. Feels like he is dehydrated. He notes a history of similar episodes.   Past Medical History  Diagnosis Date  . Hypertension   . Arthritis     left hip  . Hyperlipidemia    Past Surgical History  Procedure Laterality Date  . Back surgery    . Hernia repair    . Spine surgery      cervical fusion c6, c7 (1994), and c3,4,5,6 (2004)   Family History  Problem Relation Age of Onset  . Diabetes Mother   . Lung cancer Mother 18  . Diabetes Father   . Heart disease Father   . Colon cancer Father 19    rectal cancer   History  Substance Use Topics  . Smoking status: Current Every Day Smoker -- 1.00 packs/day for 45 years    Types: Cigarettes  . Smokeless tobacco: Never Used  . Alcohol Use: Yes     Comment: 2-3 drinks/ day    Review of Systems Ten point review of symptoms performed and is negative with the exception of symptoms noted above.     Allergies  Lodine and Percocet  Home Medications   Prior to Admission medications   Medication Sig Start Date  End Date Taking? Authorizing Provider  ALPRAZolam Duanne Moron) 1 MG tablet take 1 tablet by mouth at bedtime if needed for sleep 12/31/12  Yes Ryan M Dunn, PA-C  aspirin 81 MG tablet Take 81 mg by mouth daily.   Yes Historical Provider, MD  bisoprolol-hydrochlorothiazide (ZIAC) 10-6.25 MG per tablet take 1 tablet by mouth once daily   Yes Ryan M Dunn, PA-C   BP 90/57  Pulse 67  Temp(Src) 97.9 F (36.6 C) (Tympanic)  Resp 20  Ht 5\' 10"  (1.778 m)  Wt 226 lb (102.513 kg)  BMI 32.43 kg/m2  SpO2 94% Physical Exam Gen: well developed and well nourished appearing Head: NCAT Eyes: PERL, EOMI Nose: no epistaixis or rhinorrhea Mouth/throat: mucosa is moist and pink, oral mucosa is dehydrated appearing Neck: supple, no stridor Lungs: CTA B, no wheezing, rhonchi or rales CV: RRR, no murmur, extremities appear well perfused.  Abd: soft, notender, nondistended Back: no ttp, no cva ttp Skin: warm and dry Ext: normal to inspection, no dependent edema Neuro: CN ii-xii grossly intact, no focal deficits Psyche; normal affect,  calm and cooperative.   ED Course  Procedures (including critical care time) Labs Review Results for orders placed during the hospital encounter of 07/19/13 (from the past 24 hour(s))  CBC  Status: Abnormal   Collection Time    07/20/13 12:46 AM      Result Value Ref Range   WBC 13.8 (*) 4.0 - 10.5 K/uL   RBC 4.57  4.22 - 5.81 MIL/uL   Hemoglobin 14.8  13.0 - 17.0 g/dL   HCT 44.4  39.0 - 52.0 %   MCV 97.2  78.0 - 100.0 fL   MCH 32.4  26.0 - 34.0 pg   MCHC 33.3  30.0 - 36.0 g/dL   RDW 14.1  11.5 - 15.5 %   Platelets 161  150 - 400 K/uL  COMPREHENSIVE METABOLIC PANEL     Status: Abnormal   Collection Time    07/20/13 12:46 AM      Result Value Ref Range   Sodium 142  137 - 147 mEq/L   Potassium 3.6 (*) 3.7 - 5.3 mEq/L   Chloride 106  96 - 112 mEq/L   CO2 22  19 - 32 mEq/L   Glucose, Bld 127 (*) 70 - 99 mg/dL   BUN 13  6 - 23 mg/dL   Creatinine, Ser 0.95  0.50  - 1.35 mg/dL   Calcium 7.7 (*) 8.4 - 10.5 mg/dL   Total Protein 5.8 (*) 6.0 - 8.3 g/dL   Albumin 3.0 (*) 3.5 - 5.2 g/dL   AST 17  0 - 37 U/L   ALT 22  0 - 53 U/L   Alkaline Phosphatase 56  39 - 117 U/L   Total Bilirubin 0.7  0.3 - 1.2 mg/dL   GFR calc non Af Amer >90  >90 mL/min   GFR calc Af Amer >90  >90 mL/min  TROPONIN I     Status: None   Collection Time    07/20/13 12:46 AM      Result Value Ref Range   Troponin I <0.30  <0.30 ng/mL  ETHANOL     Status: Abnormal   Collection Time    07/20/13 12:46 AM      Result Value Ref Range   Alcohol, Ethyl (B) 60 (*) 0 - 11 mg/dL   DG Chest Portable 1 View (Final result)  Result time: 07/20/13 01:39:01    Final result by Rad Results In Interface (07/20/13 01:39:01)    Narrative:   CLINICAL DATA: Loss of consciousness. Hypotension.  EXAM: PORTABLE CHEST - 1 VIEW  COMPARISON: Chest radiograph performed 05/21/2009  FINDINGS: The lungs are well-aerated. Minimal bilateral atelectasis is noted. There is no evidence of pleural effusion or pneumothorax.  The cardiomediastinal silhouette is within normal limits. No acute osseous abnormalities are seen. Cervical spinal fusion hardware is partially imaged.  IMPRESSION: Minimal bilateral atelectasis noted; lungs otherwise clear.   Electronically Signed By: Garald Balding M.D. On: 07/20/2013 01:39     EKG: nsr, no acute ischemic changes, normal intervals, normal axis, normal qrs complex  MDM  DDX: dehydration, cardiogenic syncope, electrolyte disturbance, AKI, alcohol intoxication.   Labs are notable for mild leukocytosis. Patient is feeling better after IVF. We will ambulate in anticipation of discharge home with dx of near syncope secondary to combination of dehydration and heat exhaustion potentiated by alcohol consumption. Patient will f/u with PCP and agrees to return to the ED for red flag sx.     Elyn Peers, MD 07/20/13 [phone]

## 2013-09-01 ENCOUNTER — Other Ambulatory Visit: Payer: Self-pay | Admitting: Physician Assistant

## 2014-02-10 ENCOUNTER — Ambulatory Visit (INDEPENDENT_AMBULATORY_CARE_PROVIDER_SITE_OTHER): Payer: PRIVATE HEALTH INSURANCE

## 2014-02-10 ENCOUNTER — Ambulatory Visit (INDEPENDENT_AMBULATORY_CARE_PROVIDER_SITE_OTHER): Payer: PRIVATE HEALTH INSURANCE | Admitting: Emergency Medicine

## 2014-02-10 VITALS — BP 122/68 | HR 70 | Temp 97.8°F | Resp 18 | Ht 70.0 in | Wt 231.0 lb

## 2014-02-10 DIAGNOSIS — J40 Bronchitis, not specified as acute or chronic: Secondary | ICD-10-CM

## 2014-02-10 DIAGNOSIS — R071 Chest pain on breathing: Secondary | ICD-10-CM

## 2014-02-10 DIAGNOSIS — Z720 Tobacco use: Secondary | ICD-10-CM

## 2014-02-10 DIAGNOSIS — J209 Acute bronchitis, unspecified: Secondary | ICD-10-CM

## 2014-02-10 MED ORDER — IPRATROPIUM BROMIDE 0.02 % IN SOLN
0.5000 mg | Freq: Once | RESPIRATORY_TRACT | Status: AC
  Administered 2014-02-10: 0.5 mg via RESPIRATORY_TRACT

## 2014-02-10 MED ORDER — ALBUTEROL SULFATE HFA 108 (90 BASE) MCG/ACT IN AERS: 2.0000 | INHALATION_SPRAY | RESPIRATORY_TRACT | Status: DC | PRN

## 2014-02-10 MED ORDER — ALBUTEROL SULFATE (2.5 MG/3ML) 0.083% IN NEBU
5.0000 mg | INHALATION_SOLUTION | Freq: Once | RESPIRATORY_TRACT | Status: AC
  Administered 2014-02-10: 5 mg via RESPIRATORY_TRACT

## 2014-02-10 MED ORDER — HYDROCOD POLST-CHLORPHEN POLST 10-8 MG/5ML PO LQCR: 5.0000 mL | Freq: Two times a day (BID) | ORAL | Status: DC | PRN

## 2014-02-10 MED ORDER — AZITHROMYCIN 250 MG PO TABS: ORAL_TABLET | ORAL | Status: DC

## 2014-02-10 NOTE — Progress Notes (Signed)
[facility] [address], [place] [address] [phone]  Date:  02/10/2014   Name:  Mitchell Parrish   DOB:  20-Apr-1955   MRN:  [id number]  PCP:  Leandrew Koyanagi, MD    Chief Complaint: Left sided rib pain and Cough   History of Present Illness:  Mitchell Parrish is a 59 y.o. very pleasant male patient who presents with the following:  Coughing for the past month.  Says wheezing.  No shortness of breath.   Says he coughed today and developed sharp playing No fever or chills Has mucoid sputum No history of wheezing No coryza or nausea or vomiting. Smokes a pack a day No improvement with over the counter medications or other home remedies. Denies other complaint or health concern today.   Patient Active Problem List   Diagnosis Date Noted  . Other and unspecified hyperlipidemia 10/30/2012  . Hip pain, chronic 10/30/2012  . Degeneration of cervical intervertebral disc 10/30/2012  . Insomnia 10/30/2012  . Numbness of left anterior thigh 03/15/2012  . Seborrheic keratosis 09/27/2011  . Hypertension 09/26/2011  . BMI 32.0-32.9,adult 09/26/2011  . Gilbert's syndrome 09/26/2011  . Nicotine abuse 09/26/2011    Past Medical History  Diagnosis Date  . Hypertension   . Arthritis     left hip  . Hyperlipidemia     Past Surgical History  Procedure Laterality Date  . Back surgery    . Hernia repair    . Spine surgery      cervical fusion c6, c7 (1994), and c3,4,5,6 (2004)    History  Substance Use Topics  . Smoking status: Current Every Day Smoker -- 1.00 packs/day for 45 years    Types: Cigarettes  . Smokeless tobacco: Never Used  . Alcohol Use: Yes     Comment: 2-3 drinks/ day    Family History  Problem Relation Age of Onset  . Diabetes Mother   . Lung cancer Mother 34  . Diabetes Father   . Heart disease Father   . Colon cancer Father 39    rectal cancer    Allergies  Allergen Reactions  . Lodine [Etodolac] Hives  .  Percocet [Oxycodone-Acetaminophen] Swelling    Medication list has been reviewed and updated.  Current Outpatient Prescriptions on File Prior to Visit  Medication Sig Dispense Refill  . ALPRAZolam (XANAX) 1 MG tablet take 1 tablet by mouth at bedtime if needed for sleep 30 tablet 0  . aspirin 81 MG tablet Take 81 mg by mouth daily.    . bisoprolol-hydrochlorothiazide (ZIAC) 10-6.25 MG per tablet take 1 tablet by mouth once daily 90 tablet 0   No current facility-administered medications on file prior to visit.    Review of Systems:  As per HPI, otherwise negative.    Physical Examination: Filed Vitals:   02/10/14 1734  BP: 122/68  Pulse: 70  Temp: 97.8 F (36.6 C)  Resp: 18   Filed Vitals:   02/10/14 1734  Height: 5\' 10"  (1.778 m)  Weight: 231 lb (104.781 kg)   Body mass index is 33.15 kg/(m^2). Ideal Body Weight: Weight in (lb) to have BMI = 25: 173.9  GEN: WDWN, NAD, Non-toxic, A & O x 3 HEENT: Atraumatic, Normocephalic. Neck supple. No masses, No LAD. Ears and Nose: No external deformity. CV: RRR, No M/G/R. No JVD. No thrill. No extra heart sounds. PULM: diffuse wheezes, no crackles, rhonchi. No retractions. No resp. distress. No accessory muscle use. CHEST:  Tender left lateral chest wall ABD: S, NT, ND, +BS. No rebound. No HSM. EXTR: No c/c/e NEURO Normal gait.  PSYCH: Normally interactive. Conversant. Not depressed or anxious appearing.  Calm demeanor.    Assessment and Plan: Bronchitis bronchospasm Chest wall pain Albuterol tussionex zpak  Signed,  Ellison Carwin, MD   [facility] reading (PRIMARY) by  Dr. Ouida Sills.  Negative chest.

## 2014-02-10 NOTE — Patient Instructions (Signed)
Metered Dose Inhaler (No Spacer Used)  Inhaled medicines are the basis of treatment for asthma and other breathing problems. Inhaled medicine can only be effective if used properly. Good technique assures that the medicine reaches the lungs.  Metered dose inhalers (MDIs) are used to deliver a variety of inhaled medicines. These include quick relief or rescue medicines (such as bronchodilators) and controller medicines (such as corticosteroids). The medicine is delivered by pushing down on a metal canister to release a set amount of spray.  If you are using different kinds of inhalers, use your quick relief medicine to open the airways 10-15 minutes before using a steroid, if instructed to do so by your health care provider. If you are unsure which inhalers to use and the order of using them, ask your health care provider, nurse, or respiratory therapist.  HOW TO USE THE INHALER  1. Remove the cap from the inhaler.  2. If you are using the inhaler for the first time, you will need to prime it. Shake the inhaler for 5 seconds and release four puffs into the air, away from your face. Ask your health care provider or pharmacist if you have questions about priming your inhaler.  3. Shake the inhaler for 5 seconds before each breath in (inhalation).  4. Position the inhaler so that the top of the canister faces up.  5. Put your index finger on the top of the medicine canister. Your thumb supports the bottom of the inhaler.  6. Open your mouth.  7. Either place the inhaler between your teeth and place your lips tightly around the mouthpiece, or hold the inhaler 1-2 inches away from your open mouth. If you are unsure of which technique to use, ask your health care provider.  8. Breathe out (exhale) normally and as completely as possible.  9. Press the canister down with the index finger to release the medicine.  10. At the same time as the canister is pressed, inhale deeply and slowly until your lungs are completely filled.  This should take 4-6 seconds. Keep your tongue down.  11. Hold the medicine in your lungs for 5-10 seconds (10 seconds is best). This helps the medicine get into the small airways of your lungs.  12. Breathe out slowly, through pursed lips. Whistling is an example of pursed lips.  13. Wait at least 1 minute between puffs. Continue with the above steps until you have taken the number of puffs your health care provider has ordered. Do not use the inhaler more than your health care provider directs you to.  14. Replace the cap on the inhaler.  15. Follow the directions from your health care provider or the inhaler insert for cleaning the inhaler.  If you are using a steroid inhaler, after your last puff, rinse your mouth with water, gargle, and spit out the water. Do not swallow the water.  AVOID:  · Inhaling before or after starting the spray of medicine. It takes practice to coordinate your breathing with triggering the spray.  · Inhaling through the nose (rather than the mouth) when triggering the spray.  HOW TO DETERMINE IF YOUR INHALER IS FULL OR NEARLY EMPTY  You cannot know when an inhaler is empty by shaking it. Some inhalers are now being made with dose counters. Ask your health care provider for a prescription that has a dose counter if you feel you need that extra help. If your inhaler does not have a counter, ask your health care   provider to help you determine the date you need to refill your inhaler. Write the refill date on a calendar or your inhaler canister. Refill your inhaler 7-10 days before it runs out. Be sure to keep an adequate supply of medicine. This includes making sure it has not expired, and making sure you have a spare inhaler.  SEEK MEDICAL CARE IF:  · Symptoms are only partially relieved with your inhaler.  · You are having trouble using your inhaler.  · You experience an increase in phlegm.  SEEK IMMEDIATE MEDICAL CARE IF:  · You feel little or no relief with your inhalers. You are still  wheezing and feeling shortness of breath, tightness in your chest, or both.  · You have dizziness, headaches, or a fast heart rate.  · You have chills, fever, or night sweats.  · There is a noticeable increase in phlegm production, or there is blood in the phlegm.  MAKE SURE YOU:  · Understand these instructions.  · Will watch your condition.  · Will get help right away if you are not doing well or get worse.  Document Released: 11/21/2006 Document Revised: 06/10/2013 Document Reviewed: 07/12/2012  [employer]® Patient Information ©2015 [employer]. This information is not intended to replace advice given to you by your health care provider. Make sure you discuss any questions you have with your health care provider.

## 2014-02-12 ENCOUNTER — Other Ambulatory Visit: Payer: Self-pay | Admitting: Radiology

## 2014-02-12 DIAGNOSIS — R911 Solitary pulmonary nodule: Secondary | ICD-10-CM

## 2014-02-25 ENCOUNTER — Other Ambulatory Visit: Payer: PRIVATE HEALTH INSURANCE

## 2014-02-26 ENCOUNTER — Ambulatory Visit
Admission: RE | Admit: 2014-02-26 | Discharge: 2014-02-26 | Disposition: A | Discharge status: Home / Self Care | Payer: PRIVATE HEALTH INSURANCE | Source: Ambulatory Visit | Attending: Emergency Medicine | Admitting: Emergency Medicine

## 2014-02-26 DIAGNOSIS — R911 Solitary pulmonary nodule: Secondary | ICD-10-CM

## 2015-01-05 ENCOUNTER — Encounter: Payer: Self-pay | Admitting: Internal Medicine

## 2016-01-11 ENCOUNTER — Telehealth: Payer: Self-pay | Admitting: Internal Medicine

## 2016-01-11 NOTE — Telephone Encounter (Signed)
Patient stated that he needed to be seen sooner... He is currently scheduled with Dr. Nani Ravens for 01/13/16.

## 2016-01-11 NOTE — Telephone Encounter (Signed)
Yes I take family members just let him know it will take a little while to get the appt

## 2016-01-11 NOTE — Telephone Encounter (Signed)
Patient's doctor retired and patient's wife sees Dr. Charlett Blake. He would like to know if she could accept him as a new patient. Please advise.   Patient phone: [phone]

## 2016-01-13 ENCOUNTER — Encounter: Payer: Self-pay | Admitting: Family Medicine

## 2016-01-13 ENCOUNTER — Ambulatory Visit (INDEPENDENT_AMBULATORY_CARE_PROVIDER_SITE_OTHER): Payer: Managed Care, Other (non HMO) | Admitting: Family Medicine

## 2016-01-13 ENCOUNTER — Ambulatory Visit (HOSPITAL_BASED_OUTPATIENT_CLINIC_OR_DEPARTMENT_OTHER)
Admission: RE | Admit: 2016-01-13 | Discharge: 2016-01-13 | Disposition: A | Discharge status: Home / Self Care | Payer: Managed Care, Other (non HMO) | Source: Ambulatory Visit | Attending: Family Medicine | Admitting: Family Medicine

## 2016-01-13 VITALS — BP 138/76 | HR 86 | Temp 98.1°F | Ht 70.0 in | Wt 233.8 lb

## 2016-01-13 DIAGNOSIS — M542 Cervicalgia: Secondary | ICD-10-CM

## 2016-01-13 DIAGNOSIS — M4802 Spinal stenosis, cervical region: Secondary | ICD-10-CM | POA: Diagnosis not present

## 2016-01-13 DIAGNOSIS — Z23 Encounter for immunization: Secondary | ICD-10-CM

## 2016-01-13 DIAGNOSIS — S46811A Strain of other muscles, fascia and tendons at shoulder and upper arm level, right arm, initial encounter: Secondary | ICD-10-CM | POA: Diagnosis not present

## 2016-01-13 DIAGNOSIS — Z720 Tobacco use: Secondary | ICD-10-CM | POA: Diagnosis not present

## 2016-01-13 DIAGNOSIS — M47812 Spondylosis without myelopathy or radiculopathy, cervical region: Secondary | ICD-10-CM | POA: Insufficient documentation

## 2016-01-13 MED ORDER — ALBUTEROL SULFATE HFA 108 (90 BASE) MCG/ACT IN AERS: 2.0000 | INHALATION_SPRAY | RESPIRATORY_TRACT | 1 refills | Status: DC | PRN

## 2016-01-13 MED ORDER — NAPROXEN 500 MG PO TABS: 500.0000 mg | ORAL_TABLET | Freq: Two times a day (BID) | ORAL | 0 refills | Status: DC

## 2016-01-13 MED ORDER — CYCLOBENZAPRINE HCL 5 MG PO TABS: 5.0000 mg | ORAL_TABLET | Freq: Three times a day (TID) | ORAL | 0 refills | Status: DC | PRN

## 2016-01-13 NOTE — Progress Notes (Signed)
Pre visit review using our clinic review tool, if applicable. No additional management support is needed unless otherwise documented below in the visit note. 

## 2016-01-13 NOTE — Patient Instructions (Addendum)
Take blood pressure at home 2-3 times per week. Write it down and bring your log to your next appointment. Bring your cuff to the appointment as well.  Melatonin 2-3 mg 30-60 minutes before expected bedtime.   Stretching and range of motion exercises These exercises warm up your muscles and joints and improve the movement and flexibility of your shoulder. These exercises can also help to relieve pain, numbness, and tingling.  Exercise A: Flexion, standing 1. Stand and hold a broomstick, a cane, or a similar object. Place your hands a little more than shoulder-width apart on the object. Your left / right hand should be palm-up, and your other hand should be palm-down. 2. Push the stick to raise your left / right arm out to your side and then over your head. Use your other hand to help move the stick. Stop when you feel a stretch in your shoulder, or when you reach the angle that is recommended by your health care provider. ? Avoid shrugging your shoulder while you raise your arm. Keep your shoulder blade tucked down toward your spine. 3. Hold for 15-20 seconds. 4. Slowly return to the starting position. Repeat 2 times. Complete this exercise 1 times a day.  Exercise B: Abduction, supine 1. Lie on your back and hold a broomstick, a cane, or a similar object. Place your hands a little more than shoulder-width apart on the object. Your left / right hand should be palm-up, and your other hand should be palm-down. 2. Push the stick to raise your left / right arm out to your side and then over your head. Use your other hand to help move the stick. Stop when you feel a stretch in your shoulder, or when you reach the angle that is recommended by your health care provider. ? Avoid shrugging your shoulder while you raise your arm. Keep your shoulder blade tucked down toward your spine. 3. Hold for 15-20 seconds. 4. Slowly return to the starting position. Repeat 2 times. Complete this exercise 1 times a  day.  Exercise C: Flexion, active-assisted 1. Lie on your back. You may bend your knees for comfort. 2. Hold a broomstick, a cane, or a similar object. Place your hands about shoulder-width apart on the object. Your palms should face toward your feet. 3. Raise the stick and move your arms over your head and behind your head, toward the floor. Use your healthy arm to help your left / right arm move farther. Stop when you feel a gentle stretch in your shoulder, or when you reach the angle where your health care provider tells you to stop. 4. Hold for __________ seconds. 5. Slowly return to the starting position. Repeat __________ times. Complete this exercise __________ times a day.  Exercise D: External rotation and abduction 1. Stand in a door frame with one of your feet slightly in front of the other. This is called a staggered stance. 2. Choose one of the following positions as told by your health care provider: ? Place your hands and forearms on the door frame above your head. ? Place your hands and forearms on the door frame at the height of your head. ? Place your hands on the door frame at the height of your elbows. 3. Slowly move your weight onto your front foot until you feel a stretch across your chest and in the front of your shoulders. Keep your head and chest upright and keep your abdominal muscles tight. 4. Hold for __________ seconds.  5. To release the stretch, shift your weight to your back foot. Repeat __________ times. Complete this stretch __________ times a day. Strengthening exercises These exercises build strength and endurance in your shoulder. Endurance is the ability to use your muscles for a long time, even after your muscles get tired. Exercise E: Scapular depression and adduction 1. Sit on a stable chair. Support your arms in front of you with pillows, armrests, or a tabletop. Keep your elbows in line with the sides of your body. 2. Gently move your shoulder blades  down toward your middle back. Relax the muscles on the tops of your shoulders and in the back of your neck. 3. Hold for __________ seconds. 4. Slowly release the tension and relax your muscles completely before doing this exercise again. 5. After you have practiced this exercise, try doing the exercise without the arm support. Then, try the exercise while standing instead of sitting. Repeat __________ times. Complete this exercise __________ times a day.  Exercise F: Shoulder abduction, isometric 1. Stand or sit about 4-6 inches (10-15 cm) from a wall with your left / right side facing the wall. 2. Bend your left / right elbow and gently press your elbow against the wall. 3. Increase the pressure slowly until you are pressing as hard as you can without shrugging your shoulder. 4. Hold for __________ seconds. 5. Slowly release the tension and relax your muscles completely. Repeat __________ times. Complete this exercise __________ times a day.  Exercise G: Shoulder flexion, isometric 1. Stand or sit about 4-6 inches (10-15 cm) away from a wall with your left / right side facing the wall. 2. Keep your left / right elbow straight and gently press the top of your fist against the wall. Increase the pressure slowly until you are pressing as hard as you can without shrugging your shoulder. 3. Hold for __________ seconds. 4. Slowly release the tension and relax your muscles completely. Repeat __________ times. Complete this exercise __________ times a day.  Exercise H: Internal rotation 1. Sit in a stable chair without armrests, or stand. Secure an exercise band at your left / right side, at elbow height. 2. Place a soft object, such as a folded towel or a small pillow, under your left / right upper arm so your elbow is a few inches (about 8 cm) away from your side. 3. Hold the end of the exercise band so the band stretches. 4. Keeping your elbow pressed against the soft object under your arm, move  your forearm across your body toward your abdomen. Keep your body steady so the movement is only coming from your shoulder. 5. Hold for __________ seconds. 6. Slowly return to the starting position. Repeat __________ times. Complete this exercise __________ times a day.  Exercise I: External rotation 1. Sit in a stable chair without armrests, or stand. 2. Secure an exercise band at your left / right side, at elbow height. 3. Place a soft object, such as a folded towel or a small pillow, under your left / right upper arm so your elbow is a few inches (about 8 cm) away from your side. 4. Hold the end of the exercise band so the band stretches. 5. Keeping your elbow pressed against the soft object under your arm, move your forearm out, away from your abdomen. Keep your body steady so the movement is only coming from your shoulder. 6. Hold for __________ seconds. 7. Slowly return to the starting position. Repeat __________ times. Complete  this exercise __________ times a day. Exercise J: Shoulder extension 1. Sit in a stable chair without armrests, or stand. Secure an exercise band to a stable object in front of you so the band is at shoulder height. 2. Hold one end of the exercise band in each hand. Your palms should face each other. 3. Straighten your elbows and lift your hands up to shoulder height. 4. Step back, away from the secured end of the exercise band, until the band stretches. 5. Squeeze your shoulder blades together and pull your hands down to the sides of your thighs. Stop when your hands are straight down by your sides. Do not let your hands go behind your body. 6. Hold for __________ seconds. 7. Slowly return to the starting position. Repeat __________ times. Complete this exercise __________ times a day.  Exercise K: Shoulder extension, prone 1. Lie on your abdomen on a firm surface so your left / right arm hangs over the edge. 2. Hold a __________ weight in your hand so your  palm faces in toward your body. Your arm should be straight. 3. Squeeze your shoulder blade down toward the middle of your back. 4. Slowly raise your arm behind you, up to the height of the surface that you are lying on. Keep your arm straight. 5. Hold for __________ seconds. 6. Slowly return to the starting position and relax your muscles. Repeat __________ times. Complete this exercise __________ times a day. Exercise L: Horizontal abduction, prone 1. Lie on your abdomen on a firm surface so your left / right arm hangs over the edge. 2. Hold a __________ weight in your hand so your palm faces toward your feet. Your arm should be straight. 3. Squeeze your shoulder blade down toward the middle of your back. 4. Bend your elbow so your hand moves up, until your elbow is bent to an "L" shape (90 degrees). With your elbow bent, slowly move your forearm forward and up. Raise your hand up to the height of the surface that you are lying on. ? Your upper arm should not move, and your elbow should stay bent. ? At the top of the movement, your palm should face the floor. 5. Hold for __________ seconds. 6. Slowly return to the starting position and relax your muscles. Repeat __________ times. Complete this exercise __________ times a day. Exercise M: Horizontal abduction, standing 1. Sit on a stable chair, or stand. 2. Secure an exercise band to a stable object in front of you so the band is at shoulder height. 3. Hold one end of the exercise band in each hand. 4. Straighten your elbows and lift your hands straight in front of you, up to shoulder height. Your palms should face down, toward the floor. 5. Step back, away from the secured end of the exercise band, until the band stretches. 6. Move your arms out to your sides, and keep your arms straight. 7. Hold for __________ seconds. 8. Slowly return to the starting position. Repeat __________ times. Complete this exercise __________ times a  day. Exercise N: Scapular retraction and elevation 1. Sit on a stable chair, or stand. 2. Secure an exercise band to a stable object in front of you so the band is at shoulder height. 3. Hold one end of the exercise band in each hand. Your palms should face each other. 4. Sit in a stable chair without armrests, or stand. 5. Step back, away from the secured end of the exercise band, until the  band stretches. 6. Squeeze your shoulder blades together and lift your hands over your head. Keep your elbows straight. 7. Hold for __________ seconds. 8. Slowly return to the starting position. Repeat __________ times. Complete this exercise __________ times a day. This information is not intended to replace advice given to you by your health care provider. Make sure you discuss any questions you have with your health care provider.

## 2016-01-13 NOTE — Progress Notes (Signed)
Chief Complaint  Patient presents with  . Establish Care    Pt states he feels like he has a ruptured disc with neck pain radiatingi into RT shoulder and arm        New Patient Visit SUBJECTIVE: HPI: Mitchell Parrish is an 60 y.o.male who is being seen for establishing care.  The patient was previously seen with Dr. Laney Pastor who recently retired.  Pt has a hx of neck fusion with Dr. Carloyn Manner, most recently around 2005. Around 2 weeks ago, he started noticing R sided shoulder pain and weakness. Denies any injury, change in activity, or other known inciting incident. The pain is sharp and has been worsening. He has tried to take a couple Aleve with little benefit. No numbness or tingling.   Hx of HTN, was on Ziac. Has not taken it in over 1 year. He does have access to a BP cuff at home, however does not check it routinely.  Allergies  Allergen Reactions  . Lodine [Etodolac] Hives  . Percocet [Oxycodone-Acetaminophen] Swelling    Past Medical History:  Diagnosis Date  . Arthritis    left hip  . Hyperlipidemia   . Hypertension    Past Surgical History:  Procedure Laterality Date  . BACK SURGERY    . HERNIA REPAIR    . SPINE SURGERY     cervical fusion c6, c7 (1994), and c3,4,5,6 (2004)   Social History   Social History  . Marital status: Married  . Number of children: 4   Occupational History  . 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 Smoking status: Current Every Day Smoker    Packs/day: 1.00    Years: 45.00    Types: Cigarettes  . Smokeless tobacco: Never Used  . Alcohol use Yes     Comment: 2-3 drinks/ day  . Drug use: No  . Sexual activity: Yes   Family History  Problem Relation Age of Onset  . Diabetes Mother   . Lung cancer Mother 78  . Diabetes Father   . Heart disease Father   . Colon cancer Father 50    rectal cancer     Current Outpatient Prescriptions:  .  aspirin 81 MG tablet, Take 81 mg by mouth daily., Disp: , Rfl:  .   albuterol (PROVENTIL HFA;VENTOLIN HFA) 108 (90 Base) MCG/ACT inhaler, Inhale 2 puffs into the lungs every 4 (four) hours as needed for wheezing or shortness of breath (cough, shortness of breath or wheezing.)., Disp: 1 Inhaler, Rfl: 1 .  cyclobenzaprine (FLEXERIL) 5 MG tablet, Take 1 tablet (5 mg total) by mouth 3 (three) times daily as needed for muscle spasms., Disp: 30 tablet, Rfl: 0 .  naproxen (NAPROSYN) 500 MG tablet, Take 1 tablet (500 mg total) by mouth 2 (two) times daily with a meal., Disp: 30 tablet, Rfl: 0  ROS Neuro: Denies numbness and tingling  MSK: As noted in HPI   OBJECTIVE: BP 138/76 (BP Location: Left Arm, Patient Position: Sitting, Cuff Size: Large)   Pulse 86   Temp 98.1 F (36.7 C) (Oral)   Ht 5\' 10"  (1.778 m)   Wt 233 lb 12.8 oz (106.1 kg)   SpO2 94%   BMI 33.55 kg/m   Constitutional: -  VS reviewed -  Well developed, well nourished, appears stated age -  No apparent distress  Psychiatric: -  Oriented to person, place, and time -  Memory intact -  Affect and mood normal -  Fluent conversation, good eye contact -  Judgment and insight age appropriate  Neck: -  No gross swelling, no palpable masses -  Thyroid midline, not enlarged, mobile, no palpable masses  Cardiovascular: -  RRR, no murmurs -  No bruits  Respiratory: -  Normal respiratory effort, no accessory muscle use, no retraction -  Breath sounds equal, no wheezes, no ronchi, no crackles  Neurological:  -  CN II - XII grossly intact -  Neg Spurling's b/l -  Biceps reflex 2/4 b/l, no clonus -  Sensation in RUE intact to light touch, equal bilaterally  Musculoskeletal: -  No clubbing, no cyanosis -  5/5 strength throughout in b/l UE's -  TTP over R trapezius -  Shoulder exam elicited tenderness over the trapezius region, but not over the intended areas of interested [employer], cross over, Obrien's, Speed's, Neer's, Lift off) -  Gait normal  Skin: -  No significant lesion on inspection -  Warm and  dry to palpation   ASSESSMENT/PLAN: Strain of right trapezius muscle, initial encounter - Plan: naproxen (NAPROSYN) 500 MG tablet, cyclobenzaprine (FLEXERIL) 5 MG tablet  Neck pain - Plan: DG Cervical Spine Complete  Tobacco abuse - Plan: albuterol (PROVENTIL HFA;VENTOLIN HFA) 108 (90 Base) MCG/ACT inhaler  Refill albuterol. Cervical XR shows no acute changes. Exam supports above. Offered PT, gave home stretches and exercises. Told pt to assume we would not find any acute changes on XR unless we called him.  I want him to check his BP 2-3 times weekly and bring log to next appt. Also bring cuff to compare readings. Stop smoking. Brought up sleep issues and how he used to be on Xanax. I stated I do not rx these meds for sleep. Recommended Melatonin and will have him f/u. Patient should return in 2-4 weeks for BP check and discuss sleep. OK to schedule with Dr. Charlett Blake if he prefers, as his wife does see her. The patient voiced understanding and agreement to the plan.   [place], DO 01/13/16  8:11 AM

## 2016-02-10 ENCOUNTER — Ambulatory Visit (INDEPENDENT_AMBULATORY_CARE_PROVIDER_SITE_OTHER): Payer: Managed Care, Other (non HMO) | Admitting: Family Medicine

## 2016-02-10 ENCOUNTER — Telehealth: Payer: Self-pay | Admitting: Family Medicine

## 2016-02-10 ENCOUNTER — Encounter: Payer: Self-pay | Admitting: Family Medicine

## 2016-02-10 VITALS — BP 142/80 | HR 83 | Temp 97.7°F | Ht 70.0 in | Wt 236.2 lb

## 2016-02-10 DIAGNOSIS — Z13220 Encounter for screening for lipoid disorders: Secondary | ICD-10-CM

## 2016-02-10 DIAGNOSIS — H6981 Other specified disorders of Eustachian tube, right ear: Secondary | ICD-10-CM | POA: Diagnosis not present

## 2016-02-10 DIAGNOSIS — S46811D Strain of other muscles, fascia and tendons at shoulder and upper arm level, right arm, subsequent encounter: Secondary | ICD-10-CM | POA: Diagnosis not present

## 2016-02-10 DIAGNOSIS — I10 Essential (primary) hypertension: Secondary | ICD-10-CM | POA: Diagnosis not present

## 2016-02-10 MED ORDER — AMLODIPINE BESYLATE 5 MG PO TABS: 5.0000 mg | ORAL_TABLET | Freq: Every day | ORAL | 1 refills | Status: DC

## 2016-02-10 NOTE — Progress Notes (Signed)
Pre visit review using our clinic review tool, if applicable. No additional management support is needed unless otherwise documented below in the visit note. 

## 2016-02-10 NOTE — Telephone Encounter (Signed)
Please advise which orders need to be placed. Per last note I think its a lipid panel. Is this the only lab you would like ordered ?

## 2016-02-10 NOTE — Telephone Encounter (Signed)
Orders placed.

## 2016-02-10 NOTE — Telephone Encounter (Signed)
Pt scheduled his CPE for 03/26/16 and was advised by PCP to come in 2 days before for labs. Scheduling pt. Please be sure to place orders

## 2016-02-10 NOTE — Patient Instructions (Signed)
Flonase for your ear.  Come in around 2 days before your physical.

## 2016-02-10 NOTE — Progress Notes (Signed)
Chief Complaint  Patient presents with  . Shoulder Pain    right    Subjective Mitchell Parrish is a 61 y.o. male who presents for hypertension follow up. He does monitor home blood pressures. Blood pressures ranging from 140's/80's on average. He is not on any medications. He is adhering to a healthy diet overall. Current exercise: active at work  R ear With the past 4-5 days, the patient has been experiencing crackling in his right ear. He does work outside and notes it has been extremely cold as of late. He does not have any pain, bleeding, or fevers. He has not been ill recently. He does note that it has gotten significantly better today.  R trapezius He was evaluated around 1 month ago for right shoulder pain. He was given stretches and exercises which he has been compliant with. He notes that he is about 40% better but still having issues states the cold. He is active at work and this does affect his address. He denies any new symptoms such as numbness, tingling, or weakness.   Past Medical History:  Diagnosis Date  . Arthritis    left hip  . Hyperlipidemia   . Hypertension    Family History  Problem Relation Age of Onset  . Diabetes Mother   . Lung cancer Mother 35  . Diabetes Father   . Heart disease Father   . Colon cancer Father 78    rectal cancer     Medications Current Outpatient Prescriptions on File Prior to Visit  Medication Sig Dispense Refill  . albuterol (PROVENTIL HFA;VENTOLIN HFA) 108 (90 Base) MCG/ACT inhaler Inhale 2 puffs into the lungs every 4 (four) hours as needed for wheezing or shortness of breath (cough, shortness of breath or wheezing.). 1 Inhaler 1  . aspirin 81 MG tablet Take 81 mg by mouth daily.    . cyclobenzaprine (FLEXERIL) 5 MG tablet Take 1 tablet (5 mg total) by mouth 3 (three) times daily as needed for muscle spasms. 30 tablet 0  . naproxen (NAPROSYN) 500 MG tablet Take 1 tablet (500 mg total) by mouth 2 (two) times daily with a  meal. 30 tablet 0   Allergies Allergies  Allergen Reactions  . Lodine [Etodolac] Hives  . Percocet [Oxycodone-Acetaminophen] Swelling    Review of Systems Cardiovascular: no chest pain Respiratory:  no shortness of breath  Exam BP (!) 142/80 (BP Location: Left Arm, Patient Position: Sitting, Cuff Size: Large)   Pulse 83   Temp 97.7 F (36.5 C) (Oral)   Ht 5\' 10"  (1.778 m)   Wt 236 lb 4 oz (107.2 kg)   SpO2 93%   BMI 33.90 kg/m  General:  well developed, well nourished, in no apparent distress Skin:  warm, no pallor or diaphoresis Eyes:  pupils equal and round, sclera anicteric without injection Ears: Canals neg bilaterally, TMs negative bilaterally Heart :RRR, no murmurs, no bruits, no LE edema Lungs:  clear to auscultation, no accessory muscle use Psych: well oriented with normal range of affect and appropriate judgment/insight  Essential hypertension - Plan: amLODipine (NORVASC) 5 MG tablet, Comprehensive metabolic panel, Microalbumin / creatinine urine ratio  Dysfunction of right eustachian tube  Trapezius strain, right, subsequent encounter  Screening cholesterol level - Plan: Lipid panel  Orders as above. Counsled on diet and exercise- portion control is his biggest issue. Start Norvasc. Continue to keep blood pressure readings. Recommended Flonase if this returns. Likely due to rhinorrhea secondary to cold weather. Offered the patient  injection, referral to physical therapy, combination, or just continuing with home stretches/exercises. He opted for the last choice. F/u in 6 weeks for CPE and recheck BP. He will come in a few days beforehand for labs. The patient voiced understanding and agreement to the plan.  [place], DO 02/10/16  4:11 PM

## 2016-02-15 ENCOUNTER — Telehealth: Payer: Self-pay | Admitting: Family Medicine

## 2016-02-15 NOTE — Telephone Encounter (Signed)
Please advise. TL/CMA 

## 2016-02-15 NOTE — Telephone Encounter (Signed)
After reviewing the last note, I would like him to be seen before calling in an antibiotic. We discussed more ear issues than sinuses. Thanks.

## 2016-02-15 NOTE — Telephone Encounter (Signed)
Patient called stating that he was seen on 02/10/16 and he spoke with Dr. Nani Ravens about his ear pain that was due to sinus drainage. Patient states his ear is feeling better now but his sinuses are still hurting and his mucus has turned green. He would like to know if he could get an antibiotic or if he needs to come back in? Please advise   Phone: [phone]   Pharmacy: [facility], [address]

## 2016-02-16 NOTE — Telephone Encounter (Signed)
I have spoken to patient and informed him of Dr. Nani Ravens recommendation.Pt states he has started OTC medication and has been getting relief.Pt states he does not want to come in at this time. FYI only. TL/CMA

## 2016-03-17 ENCOUNTER — Ambulatory Visit: Payer: PRIVATE HEALTH INSURANCE | Admitting: Family Medicine

## 2016-03-22 ENCOUNTER — Other Ambulatory Visit (INDEPENDENT_AMBULATORY_CARE_PROVIDER_SITE_OTHER): Payer: Managed Care, Other (non HMO)

## 2016-03-22 DIAGNOSIS — Z13220 Encounter for screening for lipoid disorders: Secondary | ICD-10-CM

## 2016-03-22 DIAGNOSIS — I10 Essential (primary) hypertension: Secondary | ICD-10-CM | POA: Diagnosis not present

## 2016-03-22 LAB — MICROALBUMIN / CREATININE URINE RATIO
Creatinine,U: 170.8 mg/dL
Microalb Creat Ratio: 0.7 mg/g (ref 0.0–30.0)
Microalb, Ur: 1.2 mg/dL (ref 0.0–1.9)

## 2016-03-22 LAB — LIPID PANEL
CHOLESTEROL: 193 mg/dL (ref 0–200)
HDL: 52.1 mg/dL (ref >39.00)
LDL Cholesterol: 115 mg/dL — ABNORMAL HIGH (ref 0–99)
NonHDL: 141.33
Total CHOL/HDL Ratio: 4
Triglycerides: 131 mg/dL (ref 0.0–149.0)
VLDL: 26.2 mg/dL (ref 0.0–40.0)

## 2016-03-22 LAB — COMPREHENSIVE METABOLIC PANEL
ALK PHOS: 59 U/L (ref 39–117)
ALT: 20 U/L (ref 0–53)
AST: 17 U/L (ref 0–37)
Albumin: 4.2 g/dL (ref 3.5–5.2)
BUN: 19 mg/dL (ref 6–23)
CHLORIDE: 106 meq/L (ref 96–112)
CO2: 26 mEq/L (ref 19–32)
Calcium: 8.9 mg/dL (ref 8.4–10.5)
Creatinine, Ser: 0.76 mg/dL (ref 0.40–1.50)
GFR: 111.29 mL/min (ref >60.00)
Glucose, Bld: 122 mg/dL — ABNORMAL HIGH (ref 70–99)
POTASSIUM: 4 meq/L (ref 3.5–5.1)
Sodium: 137 mEq/L (ref 135–145)
Total Bilirubin: 2 mg/dL — ABNORMAL HIGH (ref 0.2–1.2)
Total Protein: 6.8 g/dL (ref 6.0–8.3)

## 2016-03-24 ENCOUNTER — Ambulatory Visit (INDEPENDENT_AMBULATORY_CARE_PROVIDER_SITE_OTHER): Payer: Managed Care, Other (non HMO) | Admitting: Family Medicine

## 2016-03-24 ENCOUNTER — Encounter: Payer: Self-pay | Admitting: Family Medicine

## 2016-03-24 VITALS — BP 138/74 | HR 81 | Temp 98.0°F | Ht 71.0 in | Wt 234.4 lb

## 2016-03-24 DIAGNOSIS — N401 Enlarged prostate with lower urinary tract symptoms: Secondary | ICD-10-CM

## 2016-03-24 DIAGNOSIS — N400 Enlarged prostate without lower urinary tract symptoms: Secondary | ICD-10-CM | POA: Insufficient documentation

## 2016-03-24 DIAGNOSIS — R350 Frequency of micturition: Secondary | ICD-10-CM | POA: Diagnosis not present

## 2016-03-24 DIAGNOSIS — R7309 Other abnormal glucose: Secondary | ICD-10-CM

## 2016-03-24 DIAGNOSIS — Z23 Encounter for immunization: Secondary | ICD-10-CM | POA: Diagnosis not present

## 2016-03-24 DIAGNOSIS — Z0000 Encounter for general adult medical examination without abnormal findings: Secondary | ICD-10-CM | POA: Diagnosis not present

## 2016-03-24 MED ORDER — TAMSULOSIN HCL 0.4 MG PO CAPS: 0.4000 mg | ORAL_CAPSULE | Freq: Every day | ORAL | 1 refills | Status: DC

## 2016-03-24 NOTE — Progress Notes (Signed)
Chief Complaint  Patient presents with  . Annual Exam    Well Male Mitchell Parrish is here for a complete physical.   His last physical was >1 year ago.  Current diet: in general, a "healthy" diet   Current exercise: active at work Weight trend: stable Does pt snore? No. Daytime fatigue? No. Seat belt? Yes.    Past Medical History:  Diagnosis Date  . Arthritis    left hip  . Hyperlipidemia   . Hypertension     Past Surgical History:  Procedure Laterality Date  . BACK SURGERY    . HERNIA REPAIR    . SPINE SURGERY     cervical fusion c6, c7 (1994), and c3,4,5,6 (2004)   Medications  Current Outpatient Prescriptions on File Prior to Visit  Medication Sig Dispense Refill  . albuterol (PROVENTIL HFA;VENTOLIN HFA) 108 (90 Base) MCG/ACT inhaler Inhale 2 puffs into the lungs every 4 (four) hours as needed for wheezing or shortness of breath (cough, shortness of breath or wheezing.). 1 Inhaler 1  . amLODipine (NORVASC) 5 MG tablet Take 1 tablet (5 mg total) by mouth daily. 30 tablet 1  . aspirin 81 MG tablet Take 81 mg by mouth daily.    . cyclobenzaprine (FLEXERIL) 5 MG tablet Take 1 tablet (5 mg total) by mouth 3 (three) times daily as needed for muscle spasms. (Patient not taking: Reported on 03/24/2016) 30 tablet 0  . naproxen (NAPROSYN) 500 MG tablet Take 1 tablet (500 mg total) by mouth 2 (two) times daily with a meal. (Patient not taking: Reported on 03/24/2016) 30 tablet 0   Allergies Allergies  Allergen Reactions  . Lodine [Etodolac] Hives  . Percocet [Oxycodone-Acetaminophen] Swelling   Family History Family History  Problem Relation Age of Onset  . Diabetes Mother   . Lung cancer Mother 40  . Diabetes Father   . Heart disease Father   . Colon cancer Father 42    rectal cancer    Review of Systems: Constitutional:  no unexpected change in weight, no fevers or chills Eye:  no recent significant change in vision Ear/Nose/Mouth/Throat:  Ears:  no tinnitus or  hearing loss Nose/Mouth/Throat:  no complaints of nasal congestion or bleeding, no sore throat and oral sores Cardiovascular:  no chest pain, no palpitations Respiratory:  no cough and no shortness of breath Gastrointestinal:  no abdominal pain, no change in bowel habits, no nausea, vomiting, diarrhea, or constipation and no black or bloody stool GU:  Male: negative for dysuria, frequency, and incontinence and negative for prostate symptoms Musculoskeletal/Extremities:  +chronic foot pain b/l (chronic plantar fasciitis) Integumentary (Skin/Breast):  no abnormal skin lesions reported Neurologic:  no headaches, no numbness, tingling Endocrine:  weight changes, masses in the neck, heat/cold intolerance, bowel or skin changes, or cardiovascular system symptoms Hematologic/Lymphatic:  no abnormal bleeding, no HIV risk factors, no night sweats, no swollen nodes, no weight loss  Exam BP 138/74 (BP Location: Left Arm, Patient Position: Sitting, Cuff Size: Large)   Pulse 81   Temp 98 F (36.7 C) (Oral)   Ht 5\' 11"  (1.803 m)   Wt 234 lb 6.4 oz (106.3 kg)   SpO2 95%   BMI 32.69 kg/m  General:  well developed, well nourished, in no apparent distress Skin:  no significant moles, warts, or growths Head:  no masses, lesions, or tenderness Eyes:  pupils equal and round, sclera anicteric without injection Ears:  canals without lesions, TMs shiny without retraction, no obvious effusion, no  erythema Nose:  nares patent, septum midline, mucosa normal Throat/Pharynx:  lips and gingiva without lesion; tongue and uvula midline; non-inflamed pharynx; no exudates or postnasal drainage Neck: neck supple without adenopathy, thyromegaly, or masses Lungs:  clear to auscultation, breath sounds equal bilaterally, no respiratory distress Cardio:  regular rate and rhythm without murmurs, heart sounds without clicks or rubs Abdomen:  abdomen soft, nontender; bowel sounds normal; no masses or organomegaly Rectal:  Sphincter of good tone, prostate is smooth, mildly enlarged, no nodules or masses appreciated. Musculoskeletal:  symmetrical muscle groups noted without atrophy or deformity Extremities:  no clubbing, cyanosis, or edema, no deformities, no skin discoloration Neuro:  gait normal; deep tendon reflexes normal and symmetric Psych: well oriented with normal range of affect and appropriate judgment/insight  Assessment and Plan  Well adult exam  Elevated glucose - Plan: Hemoglobin A1c  Benign prostatic hyperplasia with urinary frequency - Plan: tamsulosin (FLOMAX) 0.4 MG CAPS capsule   Well 61 y.o. male. Counseled on diet and exercise. He is due for colonoscopy in 2 years. Immunizations, labs, and further orders as above. Follow up in 6 weeks to recheck BPH. The patient voiced understanding and agreement to the plan.  [place], DO 03/24/16 4:58 PM

## 2016-03-24 NOTE — Progress Notes (Signed)
Pre visit review using our clinic review tool, if applicable. No additional management support is needed unless otherwise documented below in the visit note. 

## 2016-03-24 NOTE — Patient Instructions (Signed)
Don't stand up too fast on your new medication.  Healthy Eating Plan Many factors influence your heart health, including eating and exercise habits. Heart (coronary) risk increases with abnormal blood fat (lipid) levels. Heart-healthy meal planning includes limiting unhealthy fats, increasing healthy fats, and making other small dietary changes. This includes maintaining a healthy body weight to help keep lipid levels within a normal range.  WHAT IS MY PLAN?  Your health care provider recommends that you:  Drink a glass of water before meals to help with satiety.  Eat slowly.  An alternative to the water is to add Metamucil. This will help with satiety as well. It does contain calories, unlike water.  WHAT TYPES OF FAT SHOULD I CHOOSE?  Choose healthy fats more often. Choose monounsaturated and polyunsaturated fats, such as olive oil and canola oil, flaxseeds, walnuts, almonds, and seeds.  Eat more omega-3 fats. Good choices include salmon, mackerel, sardines, tuna, flaxseed oil, and ground flaxseeds. Aim to eat fish at least two times each week.  Avoid foods with partially hydrogenated oils in them. These contain trans fats. Examples of foods that contain trans fats are stick margarine, some tub margarines, cookies, crackers, and other baked goods. If you are going to avoid a fat, this is the one to avoid!  WHAT GENERAL GUIDELINES DO I NEED TO FOLLOW?  Check food labels carefully to identify foods with trans fats. Avoid these types of options when possible.  Fill one half of your plate with vegetables and green salads. Eat 4-5 servings of vegetables per day. A serving of vegetables equals 1 cup of raw leafy vegetables,  cup of raw or cooked cut-up vegetables, or  cup of vegetable juice.  Fill one fourth of your plate with whole grains. Look for the word "whole" as the first word in the ingredient list.  Fill one fourth of your plate with lean protein foods.  Eat 4-5 servings of  fruit per day. A serving of fruit equals one medium whole fruit,  cup of dried fruit,  cup of fresh, frozen, or canned fruit. Try to avoid fruits in cups/syrups as the sugar content can be high.  Eat more foods that contain soluble fiber. Examples of foods that contain this type of fiber are apples, broccoli, carrots, beans, peas, and barley. Aim to get 20-30 g of fiber per day.  Eat more home-cooked food and less restaurant, buffet, and fast food.  Limit or avoid alcohol.  Limit foods that are high in starch and sugar.  Avoid fried foods when able.  Cook foods by using methods other than frying. Baking, boiling, grilling, and broiling are all great options. Other fat-reducing suggestions include: ? Removing the skin from poultry. ? Removing all visible fats from meats. ? Skimming the fat off of stews, soups, and gravies before serving them. ? Steaming vegetables in water or broth.  Lose weight if you are overweight. Losing just 5-10% of your initial body weight can help your overall health and prevent diseases such as diabetes and heart disease.  Increase your consumption of nuts, legumes, and seeds to 4-5 servings per week. One serving of dried beans or legumes equals  cup after being cooked, one serving of nuts equals 1 ounces, and one serving of seeds equals  ounce or 1 tablespoon.  WHAT ARE GOOD FOODS CAN I EAT? Grains Grainy breads (try to find bread that is 3 g of fiber per slice or greater), oatmeal, light popcorn. Whole-grain cereals. Rice and pasta, including  brown rice and those that are made with whole wheat. Edamame pasta is a great alternative to grain pasta. It has a higher protein content. Try to avoid significant consumption of white bread, sugary cereals, or pastries/baked goods.  Vegetables All vegetables. Cooked white potatoes do not count as vegetables.  Fruits All fruits, but limit pineapple and bananas as these fruits have a higher sugar content.  Meats and  Other Protein Sources Lean, well-trimmed beef, veal, pork, and lamb. Chicken and [place] without skin. All fish and shellfish. Wild duck, rabbit, pheasant, and venison. Egg whites or low-cholesterol egg substitutes. Dried beans, peas, lentils, and tofu.Seeds and most nuts.  Dairy Low-fat or nonfat cheeses, including ricotta, string, and mozzarella. Skim or 1% milk that is liquid, powdered, or evaporated. Buttermilk that is made with low-fat milk. Nonfat or low-fat yogurt. Soy/Almond milk are good alternatives if you cannot handle dairy.  Beverages Water is the best for you. Sports drinks with less sugar are more desirable unless you are a highly active athlete.  Sweets and Desserts Sherbets and fruit ices. Honey, jam, marmalade, jelly, and syrups. Dark chocolate.  Eat all sweets and desserts in moderation.  Fats and Oils Nonhydrogenated (trans-free) margarines. Vegetable oils, including soybean, sesame, sunflower, olive, peanut, safflower, corn, canola, and cottonseed. Salad dressings or mayonnaise that are made with a vegetable oil. Limit added fats and oils that you use for cooking, baking, salads, and as spreads.  Other Cocoa powder. Coffee and tea. Most condiments.  The items listed above may not be a complete list of recommended foods or beverages. Contact your dietitian for more options.

## 2016-03-25 LAB — HEMOGLOBIN A1C: HEMOGLOBIN A1C: 5.9 % (ref 4.6–6.5)

## 2016-04-13 ENCOUNTER — Telehealth: Payer: Self-pay | Admitting: *Deleted

## 2016-04-13 NOTE — Telephone Encounter (Signed)
Received completed and signed Physician Form from Dr. Nani Ravens.  Faxed Physician Form, office notes and lab results to [facility].  Confirmation received.  Form sent for scanning.//AB/CMA

## 2016-05-05 ENCOUNTER — Encounter: Payer: Self-pay | Admitting: Family Medicine

## 2016-05-05 ENCOUNTER — Ambulatory Visit (INDEPENDENT_AMBULATORY_CARE_PROVIDER_SITE_OTHER): Payer: Managed Care, Other (non HMO) | Admitting: Family Medicine

## 2016-05-05 VITALS — BP 150/78 | HR 93 | Temp 97.7°F | Ht 71.0 in | Wt 239.4 lb

## 2016-05-05 DIAGNOSIS — N3281 Overactive bladder: Secondary | ICD-10-CM

## 2016-05-05 DIAGNOSIS — I10 Essential (primary) hypertension: Secondary | ICD-10-CM

## 2016-05-05 DIAGNOSIS — M545 Low back pain, unspecified: Secondary | ICD-10-CM

## 2016-05-05 DIAGNOSIS — M25561 Pain in right knee: Secondary | ICD-10-CM | POA: Diagnosis not present

## 2016-05-05 DIAGNOSIS — G8929 Other chronic pain: Secondary | ICD-10-CM

## 2016-05-05 MED ORDER — AMLODIPINE BESYLATE-VALSARTAN 5-320 MG PO TABS: 1.0000 | ORAL_TABLET | Freq: Every day | ORAL | 2 refills | Status: DC

## 2016-05-05 MED ORDER — MIRABEGRON ER 25 MG PO TB24: 25.0000 mg | ORAL_TABLET | Freq: Every day | ORAL | 1 refills | Status: DC

## 2016-05-05 NOTE — Progress Notes (Signed)
Chief Complaint  Patient presents with  . Follow-up    on BPH-pt states he does not feel any difference     Subjective Mitchell Parrish is a 61 y.o. male who presents for f/u urinary concerns Patient is still having frequent urination and nighttime awakenings. He was recently started on 0.4 mg of Flomax daily and has not noticed any improvement. He is not having any adverse effects of the medicine and is compliant with his regimen. He denies hematuria, urinary infection, incomplete emptying, weak stream, bladder stones and retention requiring catheterization. He reports +history of prostate cancer.  Hypertension Patient presents for hypertension follow up. He does monitor home blood pressures at grocery stores and pharmacies. Blood pressures ranging on average from 150's/90-100's. He is compliant with medications- Norvasc 5 mg daily. Patient has these side effects of medication: none He is sometimes adhering to a healthy diet overall. Exercise: Active at work- Architect  Low back pain Patient has continued issues with chronic low back pain, bilateral. He used to receive injections however stated they were very painful and not very helpful. No red flag signs. He does not routinely do home stretches or exercises at home.  R knee pain The patient also reports 4 years of chronic bilateral knee pain, worse on the right. The patient works in [employer] and is on his hands and knees for much of the day. After working, and he notices that his right leg is painful and swollen. It does not catch, lock, and he is not having any bruising, numbness, or tingling. He's tried anti-inflammatories that are no longer working.  Past Medical History:  Diagnosis Date  . Arthritis    left hip  . Hyperlipidemia   . Hypertension    Medications Current Outpatient Prescriptions on File Prior to Visit  Medication Sig Dispense Refill  . albuterol (PROVENTIL HFA;VENTOLIN HFA) 108 (90 Base) MCG/ACT  inhaler Inhale 2 puffs into the lungs every 4 (four) hours as needed for wheezing or shortness of breath (cough, shortness of breath or wheezing.). 1 Inhaler 1  . amLODipine (NORVASC) 5 MG tablet Take 1 tablet (5 mg total) by mouth daily. 30 tablet 1  . aspirin 81 MG tablet Take 81 mg by mouth daily.    . tamsulosin (FLOMAX) 0.4 MG CAPS capsule Take 1 capsule (0.4 mg total) by mouth daily after supper. 30 capsule 1   Allergies Allergies  Allergen Reactions  . Lodine [Etodolac] Hives  . Percocet [Oxycodone-Acetaminophen] Swelling    Review of Systems Constitutional:  no unexplained fevers, sweats, or chills MSK: Denies back pain GU: as noted in HPI Hematologic/Lymphatic: no night sweats, no swollen nodes  Exam BP (!) 150/78 (BP Location: Left Arm, Patient Position: Sitting, Cuff Size: Large)   Pulse 93   Temp 97.7 F (36.5 C) (Oral)   Ht 5\' 11"  (1.803 m)   Wt 239 lb 6.4 oz (108.6 kg)   SpO2 96%   BMI 33.39 kg/m  General:  well developed, well nourished, in no apparent distress Lymphs: no supraclavicular nodes, no inguinal nodes Lungs:  clear to auscultation, breath sounds equal bilaterally; normal effort without accessory muscle use Cardio:  regular rate and rhythm without murmurs, no LE edema MSK: Antalgic gait, minimal TTP of lumbar paraspinal musculature b/l, neg straight leg, poor ROM of hamstrings; R knee with moderate effusion, no warmth or erythema, +crepitus Psych: well oriented with normal range of affect and appropriate judgement/insight  Assessment and Plan  Overactive bladder - Plan: mirabegron  ER (MYRBETRIQ) 25 MG TB24 tablet  Essential hypertension - Plan: amLODipine-valsartan (EXFORGE) 5-320 MG tablet  We'll add Myrbetriq to the regimen to cover for an overactive bladder. Change Norvasc to Exforge. I would like to get a home blood pressure monitor and check his blood pressures at home. We'll refer to physical therapy for his low back pain. May benefit from  pain referral vs PM&R in future if no improvement. If knee pain is not improved, we'll get an x-ray and consider physical therapy versus referral to sports medicine versus orthopedic surgery. Follow up in 1 month(s). The patient voiced understanding and agreement to the plan.  [place], DO 05/05/16 4:53 PM

## 2016-05-05 NOTE — Progress Notes (Signed)
Pre visit review using our clinic review tool, if applicable. No additional management support is needed unless otherwise documented below in the visit note. 

## 2016-05-11 MED ORDER — METHYLPREDNISOLONE ACETATE 40 MG/ML IJ SUSP
40.0000 mg | Freq: Once | INTRAMUSCULAR | Status: AC
  Administered 2016-05-05: 40 mg

## 2016-05-11 MED ORDER — LIDOCAINE HCL 1 % IJ SOLN
2.0000 mL | Freq: Once | INTRAMUSCULAR | Status: AC
  Administered 2016-05-05: 2 mL

## 2016-05-11 NOTE — Addendum Note (Signed)
Addended by: Harl Bowie on: 05/11/2016 05:41 PM   Modules accepted: Orders

## 2016-05-19 ENCOUNTER — Telehealth: Payer: Self-pay | Admitting: *Deleted

## 2016-05-19 MED ORDER — OXYBUTYNIN CHLORIDE ER 5 MG PO TB24
5.0000 mg | ORAL_TABLET | Freq: Every day | ORAL | 2 refills | Status: DC
Start: 2016-05-19 — End: 2018-06-12

## 2016-05-19 NOTE — Telephone Encounter (Signed)
PA initiated on [email or web address] [id number] Message that the Myrbetriq 25mg  er tablets has been rejected by insurance.  Was given a list of first-line therapies may not require a prior authorization.  Informed Dr. Nani Ravens of the message and he would like to try one of the therapies that was recommended.  New prescription for Oxybutynin Chloride ER 5mg - Take 1 tablet by mouth at bedtime #30,2 was sent to the pharmacy by  [facility].  Pt was made aware of the medication change.//AB/CMA

## 2016-11-02 ENCOUNTER — Other Ambulatory Visit: Payer: Self-pay | Admitting: Family Medicine

## 2016-11-02 DIAGNOSIS — I10 Essential (primary) hypertension: Secondary | ICD-10-CM

## 2016-11-21 ENCOUNTER — Ambulatory Visit (HOSPITAL_BASED_OUTPATIENT_CLINIC_OR_DEPARTMENT_OTHER)
Admission: RE | Admit: 2016-11-21 | Discharge: 2016-11-21 | Disposition: A | Discharge status: Home / Self Care | Payer: Managed Care, Other (non HMO) | Source: Ambulatory Visit | Attending: Family Medicine | Admitting: Family Medicine

## 2016-11-21 ENCOUNTER — Ambulatory Visit (INDEPENDENT_AMBULATORY_CARE_PROVIDER_SITE_OTHER): Payer: Managed Care, Other (non HMO) | Admitting: Family Medicine

## 2016-11-21 ENCOUNTER — Encounter: Payer: Self-pay | Admitting: Family Medicine

## 2016-11-21 VITALS — BP 120/78 | HR 90 | Temp 97.7°F | Ht 71.0 in | Wt 236.4 lb

## 2016-11-21 DIAGNOSIS — M25561 Pain in right knee: Secondary | ICD-10-CM

## 2016-11-21 DIAGNOSIS — K219 Gastro-esophageal reflux disease without esophagitis: Secondary | ICD-10-CM | POA: Diagnosis not present

## 2016-11-21 DIAGNOSIS — Z23 Encounter for immunization: Secondary | ICD-10-CM

## 2016-11-21 DIAGNOSIS — M1711 Unilateral primary osteoarthritis, right knee: Secondary | ICD-10-CM | POA: Diagnosis not present

## 2016-11-21 DIAGNOSIS — R079 Chest pain, unspecified: Secondary | ICD-10-CM | POA: Diagnosis not present

## 2016-11-21 MED ORDER — OMEPRAZOLE 20 MG PO CPDR: 20.0000 mg | DELAYED_RELEASE_CAPSULE | Freq: Two times a day (BID) | ORAL | 1 refills | Status: DC

## 2016-11-21 NOTE — Progress Notes (Signed)
Chief Complaint  Patient presents with  . Shoulder Pain  . Chest Pain    Mitchell Parrish is a 61 y.o. male here for evaluation of chest pain.  Duration of issue: 3-4 days Quality: sharp Palliation: Dexilant helps, sitting up Provocation: none Severity: 6-7/10 Radiation: Starts from under L shoulder pain Duration of chest pain: Continuous Associated symptoms: sometimes SOB Cardiac history: HTN Family heart history: Father Smoker? Yes   R knee pain Steroid injection at last visit, felt great for 5-6 weeks then came back. Started initially when he twisted his knee with a varus force. He's been having intermittent issues since then. He is gaining weight as well.  ROS:  Cardiac: No current chest pain Lungs: No SOB  Past Medical History:  Diagnosis Date  . Arthritis    left hip  . Hyperlipidemia   . Hypertension    Family History  Problem Relation Age of Onset  . Diabetes Mother   . Lung cancer Mother 60  . Diabetes Father   . Heart disease Father   . Colon cancer Father 73       rectal cancer   Social History   Social History  . Marital status: Married   Occupational History  . 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 Smoking status: Former Smoker    Packs/day: 1.00    Years: 45.00    Types: E-cigarettes    Quit date: 03/15/2016  . Smokeless tobacco: Never Used  . Alcohol use Yes     Comment: 2-3 drinks/ day  . Drug use: No  . Sexual activity: Yes   Allergies as of 11/21/2016      Reactions   Lodine [etodolac] Hives   Percocet [oxycodone-acetaminophen] Swelling      Medication List       Accurate as of 11/21/16  4:52 PM. Always use your most recent med list.          albuterol 108 (90 Base) MCG/ACT inhaler Commonly known as:  PROVENTIL HFA;VENTOLIN HFA Inhale 2 puffs into the lungs every 4 (four) hours as needed for wheezing or shortness of breath (cough, shortness of breath or wheezing.).   amLODipine-valsartan 5-320 MG  tablet Commonly known as:  EXFORGE TAKE 1 TABLET BY MOUTH DAILY   aspirin 81 MG tablet Take 81 mg by mouth daily.   mirabegron ER 25 MG Tb24 tablet Commonly known as:  MYRBETRIQ Take 1 tablet (25 mg total) by mouth daily.   omeprazole 20 MG capsule Commonly known as:  PRILOSEC Take 1 capsule (20 mg total) by mouth 2 (two) times daily before a meal.   oxybutynin 5 MG 24 hr tablet Commonly known as:  DITROPAN-XL Take 1 tablet (5 mg total) by mouth at bedtime.   tamsulosin 0.4 MG Caps capsule Commonly known as:  FLOMAX Take 1 capsule (0.4 mg total) by mouth daily after supper.       BP 120/78 (BP Location: Left Arm, Patient Position: Sitting, Cuff Size: Large)   Pulse 90   Temp 97.7 F (36.5 C) (Oral)   Ht 5\' 11"  (1.803 m)   Wt 236 lb 6 oz (107.2 kg)   SpO2 93%   BMI 32.97 kg/m  Gen: awake, alert, appears stated age HEENT: PERRLA, MMM Neck: No masses or asymmetry Heart: RRR, no bruits, no LE edema Lungs: CTAB, no accessory muscle use Abd: Soft, NT, ND, no masses or organomegaly MSK: chest pain is reproducible to palpitation in left upper  chest reation Psych: Age appropriate judgment and insight, nml mood and affect  Chest pain, unspecified type - Plan: EKG 12-Lead  Gastroesophageal reflux disease, esophagitis presence not specified - Plan: omeprazole (PRILOSEC) 20 MG capsule  Right knee pain, unspecified chronicity - Plan: DG Knee Complete 4 Views Right  Need for influenza vaccination - Plan: Flu Vaccine QUAD 6+ mos PF IM (Fluarix Quad PF)  Orders as above. EKG neg. Atypical chest pain; offered referral to cards, declined at this time. Sounds like reflux centrally, will trial 2 mo of PPI. Ice for msk pain. Cont NSAIDs and Tylenol, activity mod.  XR for knee, f/u pending results. May try PT, offered injection again, declined.  F/u in 2 mo or prn. The patient voiced understanding and agreement to the plan.  [place], DO 11/21/16 4:52 PM

## 2016-11-21 NOTE — Progress Notes (Signed)
Pre visit review using our clinic review tool, if applicable. No additional management support is needed unless otherwise documented below in the visit note. 

## 2016-11-21 NOTE — Patient Instructions (Signed)
This does not sound related to your heart.  If you change your mind and would like to see a cardiologist, let me know.  If you would like to have an injection in your knee, just let me know and we can get that arranged, free of copay since we discussed it today.  The only lifestyle changes that have data behind them are weight loss for the overweight/obese and elevating the head of the bed. Finding out which foods/positions are triggers is important.  I will let you know the result of your X-ray and management will be based on that.   Let us know if you need anything.

## 2016-11-26 ENCOUNTER — Other Ambulatory Visit: Payer: Self-pay | Admitting: Family Medicine

## 2016-11-26 DIAGNOSIS — Z720 Tobacco use: Secondary | ICD-10-CM

## 2016-12-05 ENCOUNTER — Encounter: Payer: Self-pay | Admitting: Gastroenterology

## 2017-03-16 ENCOUNTER — Other Ambulatory Visit: Payer: Self-pay | Admitting: Family Medicine

## 2017-03-16 DIAGNOSIS — I10 Essential (primary) hypertension: Secondary | ICD-10-CM

## 2017-03-20 ENCOUNTER — Other Ambulatory Visit: Payer: Self-pay | Admitting: Family Medicine

## 2017-03-20 DIAGNOSIS — K219 Gastro-esophageal reflux disease without esophagitis: Secondary | ICD-10-CM

## 2017-06-21 ENCOUNTER — Ambulatory Visit (HOSPITAL_BASED_OUTPATIENT_CLINIC_OR_DEPARTMENT_OTHER)
Admission: RE | Admit: 2017-06-21 | Discharge: 2017-06-21 | Disposition: A | Discharge status: Home / Self Care | Payer: BLUE CROSS/BLUE SHIELD | Source: Ambulatory Visit | Attending: Medical | Admitting: Medical

## 2017-06-21 ENCOUNTER — Encounter: Payer: Self-pay | Admitting: Medical

## 2017-06-21 ENCOUNTER — Ambulatory Visit: Payer: BLUE CROSS/BLUE SHIELD | Admitting: Medical

## 2017-06-21 VITALS — BP 134/79 | HR 80 | Temp 98.0°F | Resp 16 | Ht 71.0 in | Wt 242.2 lb

## 2017-06-21 DIAGNOSIS — R05 Cough: Secondary | ICD-10-CM

## 2017-06-21 DIAGNOSIS — F17200 Nicotine dependence, unspecified, uncomplicated: Secondary | ICD-10-CM

## 2017-06-21 DIAGNOSIS — J440 Chronic obstructive pulmonary disease with acute lower respiratory infection: Secondary | ICD-10-CM | POA: Diagnosis not present

## 2017-06-21 DIAGNOSIS — J449 Chronic obstructive pulmonary disease, unspecified: Secondary | ICD-10-CM | POA: Diagnosis not present

## 2017-06-21 DIAGNOSIS — J40 Bronchitis, not specified as acute or chronic: Secondary | ICD-10-CM | POA: Diagnosis not present

## 2017-06-21 DIAGNOSIS — J209 Acute bronchitis, unspecified: Secondary | ICD-10-CM

## 2017-06-21 DIAGNOSIS — R0600 Dyspnea, unspecified: Secondary | ICD-10-CM | POA: Diagnosis not present

## 2017-06-21 DIAGNOSIS — R059 Cough, unspecified: Secondary | ICD-10-CM

## 2017-06-21 DIAGNOSIS — R062 Wheezing: Secondary | ICD-10-CM

## 2017-06-21 MED ORDER — AZITHROMYCIN 250 MG PO TABS: ORAL_TABLET | ORAL | 0 refills | Status: DC

## 2017-06-21 MED ORDER — BUDESONIDE-FORMOTEROL FUMARATE 80-4.5 MCG/ACT IN AERO: 2.0000 | INHALATION_SPRAY | Freq: Two times a day (BID) | RESPIRATORY_TRACT | 3 refills | Status: DC

## 2017-06-21 MED ORDER — PREDNISONE 10 MG PO TABS: ORAL_TABLET | ORAL | 0 refills | Status: DC

## 2017-06-21 MED ORDER — BENZONATATE 100 MG PO CAPS: 100.0000 mg | ORAL_CAPSULE | Freq: Three times a day (TID) | ORAL | 0 refills | Status: DC | PRN

## 2017-06-21 MED ORDER — METHYLPREDNISOLONE ACETATE 80 MG/ML IJ SUSP
80.0000 mg | Freq: Once | INTRAMUSCULAR | Status: AC
Start: 2017-06-21 — End: 2017-06-21
  Administered 2017-06-21: 80 mg via INTRAMUSCULAR

## 2017-06-21 NOTE — Progress Notes (Signed)
Subjective:    Patient ID: Mitchell Parrish, male    DOB: September 12, 1955, 62 y.o.   MRN: [id number]  HPI  Pt in with some wheezing and chest congestion. Feels like he can't take full deep breath just recently. He thinks he is using albuterol every 2-3 hours past month. Pt has smoked for about 50 years. Pack or more a day.  He states that the severe wheezing has just been occurring over the last week.  Pt is tapering off cigarettes now . 14 cigarretes a day now. Was smoking 25-35 cigarettes a day.  Pt is coughing up mucus daily recently.   No fever, no chills or sweats.  Review of Systems  Constitutional: Negative for chills, fatigue and fever.  HENT: Negative for congestion, facial swelling, hearing loss, nosebleeds and rhinorrhea.   Respiratory: Positive for cough, shortness of breath and wheezing.        At times he feels like he cannot take full deep breath.  Cardiovascular: Negative for chest pain and palpitations.  Gastrointestinal: Negative for abdominal distention, abdominal pain, blood in stool, nausea and vomiting.  Musculoskeletal: Negative for back pain.  Neurological: Negative for dizziness, weakness, light-headedness and headaches.  Hematological: Negative for adenopathy. Does not bruise/bleed easily.  Psychiatric/Behavioral: Negative for behavioral problems and confusion.   Past Medical History:  Diagnosis Date  . Arthritis    left hip  . Hyperlipidemia   . Hypertension      Social History   Socioeconomic History  . Marital status: Married    Spouse name: Not on file  . Number of children: 4  . Years of education: Not on file  . Highest education level: Not on file  Occupational History  . Occupation: TRUCK DRIVER    Employer: [employer]  Social Needs  . Financial resource strain: Not on file  . Food insecurity:    Worry: Not on file    Inability: Not on file  . Transportation needs:    Medical: Not on file    Non-medical: Not on file  Tobacco Use    . Smoking status: Former Smoker    Packs/day: 1.00    Years: 45.00    Pack years: 45.00    Types: E-cigarettes    Last attempt to quit: 03/15/2016    Years since quitting: 1.2  . Smokeless tobacco: Never Used  Substance and Sexual Activity  . Alcohol use: Yes    Comment: 2-3 drinks/ day  . Drug use: No  . Sexual activity: Yes  Lifestyle  . Physical activity:    Days per week: Not on file    Minutes per session: Not on file  . Stress: Not on file  Relationships  . Social connections:    Talks on phone: Not on file    Gets together: Not on file    Attends religious service: Not on file    Active member of club or organization: Not on file    Attends meetings of clubs or organizations: Not on file    Relationship status: Not on file  . Intimate partner violence:    Fear of current or ex partner: Not on file    Emotionally abused: Not on file    Physically abused: Not on file    Forced sexual activity: Not on file  Other Topics Concern  . Not on file  Social History Narrative  . Not on file    Past Surgical History:  Procedure Laterality Date  . BACK  SURGERY    . HERNIA REPAIR    . SPINE SURGERY     cervical fusion c6, c7 (1994), and c3,4,5,6 (2004)    Family History  Problem Relation Age of Onset  . Diabetes Mother   . Lung cancer Mother 69  . Diabetes Father   . Heart disease Father   . Colon cancer Father 63       rectal cancer    Allergies  Allergen Reactions  . Lodine [Etodolac] Hives  . Percocet [Oxycodone-Acetaminophen] Swelling    Current Outpatient Medications on File Prior to Visit  Medication Sig Dispense Refill  . amLODipine-valsartan (EXFORGE) 5-320 MG tablet TAKE 1 TABLET BY MOUTH DAILY 30 tablet 2  . aspirin 81 MG tablet Take 81 mg by mouth daily.    . mirabegron ER (MYRBETRIQ) 25 MG TB24 tablet Take 1 tablet (25 mg total) by mouth daily. (Patient not taking: Reported on 06/21/2017) 30 tablet 1  . omeprazole (PRILOSEC) 20 MG capsule TAKE 1  CAPSULE BY MOUTH TWICE DAILY BEFORE A MEAL (Patient not taking: Reported on 06/21/2017) 30 capsule 0  . oxybutynin (DITROPAN-XL) 5 MG 24 hr tablet Take 1 tablet (5 mg total) by mouth at bedtime. (Patient not taking: Reported on 06/21/2017) 30 tablet 2  . PROAIR HFA 108 (90 Base) MCG/ACT inhaler INHALE 2 PUFFS INTO THE LUNGS EVERY 4 HOURS AS NEEDED FOR WHEEZING ORSHORTNESS OF BREATH 18 g 4  . tamsulosin (FLOMAX) 0.4 MG CAPS capsule Take 1 capsule (0.4 mg total) by mouth daily after supper. (Patient not taking: Reported on 06/21/2017) 30 capsule 1   No current facility-administered medications on file prior to visit.     BP 134/79   Pulse 80   Temp 98 F (36.7 C) (Oral)   Resp 16   Ht 5\' 11"  (1.803 m)   Wt 242 lb 3.2 oz (109.9 kg)   SpO2 95%   BMI 33.78 kg/m       Objective:   Physical Exam  General  Mental Status - Alert. General Appearance - Well groomed. Not in acute distress.  Skin Rashes- No Rashes.  HEENT Head- Normal. Ear Auditory Canal - Left- Normal. Right - Normal.Tympanic Membrane- Left- Normal. Right- Normal. Eye Sclera/Conjunctiva- Left- Normal. Right- Normal. Nose & Sinuses Nasal Mucosa- Left-  Boggy and Congested. Right-  Boggy and  Congested.Bilateral maxillary and frontal sinus pressure. Mouth & Throat Lips: Upper Lip- Normal: no dryness, cracking, pallor, cyanosis, or vesicular eruption. Lower Lip-Normal: no dryness, cracking, pallor, cyanosis or vesicular eruption. Buccal Mucosa- Bilateral- No Aphthous ulcers. Oropharynx- No Discharge or Erythema. Tonsils: Characteristics- Bilateral- No Erythema or Congestion. Size/Enlargement- Bilateral- No enlargement. Discharge- bilateral-None.  Neck Neck- Supple. No Masses.   Chest and Lung Exam Auscultation: Breath Sounds:-Tight shallow breathing but even and unlabored.  Scattered wheezing throughout.  Cardiovascular Auscultation:Rythm- Regular, rate and rhythm. Murmurs & Other Heart Sounds:Ausculatation of the  heart reveal- No Murmurs.  Lymphatic Head & Neck General Head & Neck Lymphatics: Bilateral: Description- No Localized lymphadenopathy.   Lower ext- 1+ pedal edema bilaterally. Negative homan sign bilateraly.      Assessment & Plan:  You do have presentation of probable acute bronchitis with COPD.  Long history of smoking.  For your moderate to severe wheezing recently, we gave you Depo-Medrol 80 mg IM and I did prescribe a 5-day taper dose of prednisone.  Prescription of Symbicort sent to pharmacy.  If you can get that filled then you would use this daily and continue albuterol  but only as back-up for severe shortness of breath or wheezing.  If signs and symptoms worsen despite the above measures then recommend ED evaluation.  Also prescription of a azithromycin antibiotic sent to pharmacy and prescription of benzonatate for cough.  Will get labs today to include CBC, CMP and BNP.  Follow-up in 7 days or as needed.  Mackie Pai, PA-C

## 2017-06-21 NOTE — Patient Instructions (Signed)
You do have presentation of probable acute bronchitis with COPD.  Long history of smoking.  For your moderate to severe wheezing recently, we gave you Depo-Medrol 80 mg IM and I did prescribe a 5-day taper dose of prednisone.  Prescription of Symbicort sent to pharmacy.  If you can get that filled then you would use this daily and continue albuterol but only as back-up for severe shortness of breath or wheezing.  If signs and symptoms worsen despite the above measures then recommend ED evaluation.  Also prescription of a azithromycin antibiotic sent to pharmacy and prescription of benzonatate for cough.  Will get labs today to include CBC, CMP and BNP.  Follow-up in 7 days or as needed.

## 2017-06-22 ENCOUNTER — Telehealth: Payer: Self-pay | Admitting: Family Medicine

## 2017-06-22 LAB — CBC WITH DIFFERENTIAL/PLATELET
Basophils Absolute: 0.1 10*3/uL (ref 0.0–0.1)
Basophils Relative: 0.8 % (ref 0.0–3.0)
EOS PCT: 5.9 % — AB (ref 0.0–5.0)
Eosinophils Absolute: 0.5 10*3/uL (ref 0.0–0.7)
HCT: 47.8 % (ref 39.0–52.0)
Hemoglobin: 16.4 g/dL (ref 13.0–17.0)
LYMPHS ABS: 2 10*3/uL (ref 0.7–4.0)
Lymphocytes Relative: 25.3 % (ref 12.0–46.0)
MCHC: 34.4 g/dL (ref 30.0–36.0)
MCV: 93.3 fl (ref 78.0–100.0)
Monocytes Absolute: 0.7 10*3/uL (ref 0.1–1.0)
Monocytes Relative: 8.4 % (ref 3.0–12.0)
NEUTROS ABS: 4.7 10*3/uL (ref 1.4–7.7)
NEUTROS PCT: 59.6 % (ref 43.0–77.0)
PLATELETS: 209 10*3/uL (ref 150.0–400.0)
RBC: 5.12 Mil/uL (ref 4.22–5.81)
RDW: 13.7 % (ref 11.5–15.5)
WBC: 7.9 10*3/uL (ref 4.0–10.5)

## 2017-06-22 LAB — COMPREHENSIVE METABOLIC PANEL
ALT: 20 U/L (ref 0–53)
AST: 12 U/L (ref 0–37)
Albumin: 4.2 g/dL (ref 3.5–5.2)
Alkaline Phosphatase: 70 U/L (ref 39–117)
BUN: 12 mg/dL (ref 6–23)
CHLORIDE: 103 meq/L (ref 96–112)
CO2: 24 mEq/L (ref 19–32)
Calcium: 9.1 mg/dL (ref 8.4–10.5)
Creatinine, Ser: 0.84 mg/dL (ref 0.40–1.50)
GFR: 98.73 mL/min (ref >60.00)
GLUCOSE: 88 mg/dL (ref 70–99)
POTASSIUM: 4.2 meq/L (ref 3.5–5.1)
SODIUM: 137 meq/L (ref 135–145)
Total Bilirubin: 1.1 mg/dL (ref 0.2–1.2)
Total Protein: 6.9 g/dL (ref 6.0–8.3)

## 2017-06-22 LAB — BRAIN NATRIURETIC PEPTIDE: Pro B Natriuretic peptide (BNP): 29 pg/mL (ref 0.0–100.0)

## 2017-06-22 NOTE — Telephone Encounter (Signed)
He should get a call regarding lab results but they look good.  Advised him he can start the taper of prednisone.

## 2017-06-22 NOTE — Telephone Encounter (Signed)
Copied from [place] [id number] [id number]. Topic: Quick Communication - See Telephone Encounter >> Jun 22, 2017  9:51 AM Mitchell Parrish wrote: CRM for notification. See Telephone encounter for: 06/22/17.   Patient said that he saw [name] yesterday and that he gave him prednisone. He said that he was going to call him this morning with the results. He said does he need to go ahead and start the prednisone or wait until he gets the results. Please advise. [phone]

## 2017-06-23 NOTE — Telephone Encounter (Signed)
[place] notified patient of results.

## 2017-07-24 ENCOUNTER — Other Ambulatory Visit: Payer: Self-pay | Admitting: Family Medicine

## 2017-07-24 DIAGNOSIS — I10 Essential (primary) hypertension: Secondary | ICD-10-CM

## 2017-12-01 DIAGNOSIS — Z23 Encounter for immunization: Secondary | ICD-10-CM | POA: Diagnosis not present

## 2017-12-28 ENCOUNTER — Other Ambulatory Visit: Payer: Self-pay | Admitting: Medical

## 2017-12-28 ENCOUNTER — Other Ambulatory Visit: Payer: Self-pay | Admitting: Family Medicine

## 2017-12-28 DIAGNOSIS — I10 Essential (primary) hypertension: Secondary | ICD-10-CM

## 2017-12-28 DIAGNOSIS — Z720 Tobacco use: Secondary | ICD-10-CM

## 2018-04-04 ENCOUNTER — Other Ambulatory Visit: Payer: Self-pay | Admitting: Family Medicine

## 2018-04-04 DIAGNOSIS — Z720 Tobacco use: Secondary | ICD-10-CM

## 2018-05-09 ENCOUNTER — Ambulatory Visit (INDEPENDENT_AMBULATORY_CARE_PROVIDER_SITE_OTHER): Payer: BLUE CROSS/BLUE SHIELD | Admitting: Medical

## 2018-05-09 ENCOUNTER — Other Ambulatory Visit: Payer: Self-pay

## 2018-05-09 ENCOUNTER — Encounter: Payer: Self-pay | Admitting: Medical

## 2018-05-09 DIAGNOSIS — F17200 Nicotine dependence, unspecified, uncomplicated: Secondary | ICD-10-CM

## 2018-05-09 DIAGNOSIS — R062 Wheezing: Secondary | ICD-10-CM

## 2018-05-09 DIAGNOSIS — J301 Allergic rhinitis due to pollen: Secondary | ICD-10-CM

## 2018-05-09 DIAGNOSIS — J40 Bronchitis, not specified as acute or chronic: Secondary | ICD-10-CM

## 2018-05-09 MED ORDER — LEVOCETIRIZINE DIHYDROCHLORIDE 5 MG PO TABS: 5.0000 mg | ORAL_TABLET | Freq: Every evening | ORAL | 0 refills | Status: DC

## 2018-05-09 MED ORDER — DOXYCYCLINE HYCLATE 100 MG PO TABS: 100.0000 mg | ORAL_TABLET | Freq: Two times a day (BID) | ORAL | 0 refills | Status: DC

## 2018-05-09 MED ORDER — FLUTICASONE PROPIONATE 50 MCG/ACT NA SUSP: 2.0000 | Freq: Every day | NASAL | 1 refills | Status: DC

## 2018-05-09 MED ORDER — PREDNISONE 10 MG (21) PO TBPK: ORAL_TABLET | ORAL | 0 refills | Status: DC

## 2018-05-09 NOTE — Patient Instructions (Signed)
You do appear to have probable allergic rhinitis symptoms with a bronchitis and some wheezing related to allergies as well as your previous history of smoking.  Similar symptoms last year around this time.  I prescribed you Xyzal antihistamine, Flonase nasal spray and 6-day taper dose of prednisone.  I would recommend that you use your Symbicort 2 inhalations twice daily.  Use albuterol as needed as well.  If you develop more productive cough or sinus pressure then recommend starting doxycycline.  With your medical history would recommend that we proceed with caution.  If you get worse rather than better then would recommend that she be evaluated further in urgent care or emergency department for covid testing.  Presently I do not suspect this but number of cases in [place] are increasing daily.  In addition you early in your also symptoms so we need to follow you closely.  Please call me or MyChart me if you have worsening or changing symptoms.   Follow-up date to be determined how you do clinically over the next 7 days.

## 2018-05-09 NOTE — Progress Notes (Signed)
   Subjective:    Patient ID: Mitchell Parrish, male    DOB: 1955/08/15, 63 y.o.   MRN: [id number]  HPI  Virtual Visit via Video Note  I connected with Clint Bolder on 05/09/18 at 10:40 AM EDT by a video enabled telemedicine application and verified that I am speaking with the correct person using two identifiers.   I discussed the limitations of evaluation and management by telemedicine and the availability of in person appointments. The patient expressed understanding and agreed to proceed.  History of Present Illness:  Pt does not have any vitals and states has had no fever.  Pt states he has allergic rhinitis last weeks. Pt states last 2 years when pollen falls he gets nasal congestion, blowing nose one time an hour, and  mild pnd. Occasional intermittent dry cough with intermixed mild white mucus. No fever, no body aches. He does have faint wheeze. Pt uses symbicort one time a day. He has rescue inhaler. Pt does smoke. For past 50 years.  Pt is taking zyrtec. Helps a little. Pt states feels like he felt last year.  No known contact with covid contact, no travel recently and no known suspicious sick contacts who are quarantined.   Pt has no swelling of his legs. No chest pain.    Observations/Objective: No acute distress.  Assessment and Plan: You do appear to have probable allergic rhinitis symptoms with a bronchitis and some wheezing related to allergies as well as your previous history of smoking.  Similar symptoms last year around this time.  I prescribed you Xyzal antihistamine, Flonase nasal spray and 6-day taper dose of prednisone.  I would recommend that you use your Symbicort 2 inhalations twice daily.  Use albuterol as needed as well.  If you develop more productive cough or sinus pressure then recommend starting doxycycline.  With your medical history would recommend that we proceed with caution.  If you get worse rather than better then would recommend that she be  evaluated further in urgent care or emergency department for covid testing.  Presently I do not suspect this but number of cases in [place] are increasing daily.  In addition you early in your also symptoms so we need to follow you closely.  Please call me or MyChart me if you have worsening or changing symptoms.   Follow-up date to be determined how you do clinically over the next 7 days.  Follow Up Instructions:    I discussed the assessment and treatment plan with the patient. The patient was provided an opportunity to ask questions and all were answered. The patient agreed with the plan and demonstrated an understanding of the instructions.   The patient was advised to call back or seek an in-person evaluation if the symptoms worsen or if the condition fails to improve as anticipated.  I provided 25  minutes of non-face-to-face time during this encounter.   Mackie Pai, PA-C   Review of Systems See hpi.    Objective:   Physical Exam  na      Assessment & Plan:

## 2018-05-21 ENCOUNTER — Encounter: Payer: Self-pay | Admitting: Family Medicine

## 2018-05-21 ENCOUNTER — Ambulatory Visit: Payer: Self-pay | Admitting: *Deleted

## 2018-05-21 ENCOUNTER — Ambulatory Visit (INDEPENDENT_AMBULATORY_CARE_PROVIDER_SITE_OTHER): Payer: BLUE CROSS/BLUE SHIELD | Admitting: Family Medicine

## 2018-05-21 ENCOUNTER — Other Ambulatory Visit: Payer: Self-pay

## 2018-05-21 VITALS — Temp 97.9°F

## 2018-05-21 DIAGNOSIS — J441 Chronic obstructive pulmonary disease with (acute) exacerbation: Secondary | ICD-10-CM | POA: Diagnosis not present

## 2018-05-21 MED ORDER — UMECLIDINIUM BROMIDE 62.5 MCG/INH IN AEPB: 1.0000 | INHALATION_SPRAY | Freq: Every day | RESPIRATORY_TRACT | 5 refills | Status: DC

## 2018-05-21 MED ORDER — PREDNISONE 20 MG PO TABS: 40.0000 mg | ORAL_TABLET | Freq: Every day | ORAL | 0 refills | Status: DC

## 2018-05-21 NOTE — Telephone Encounter (Signed)
Pt has early onset of COPD Called last week and had Virtual visit with his provider. Symptoms started yesterday. But today he can hardly walk 10 to 15 feet without getting winded.   . No fevers, headaches, cough or chest pain. Feels bloated now. No swelling in lower extremities. Has not traveled out of . Flow at [facility] at [facility] notified for review and recommendation. Flow will call the patient back regarding an appointment. Pt advised that it would possibly a virtual visit. And to call 911 for increased in shortness of breath or other symptoms. Pt voiced understanding.   Reason for Disposition . [1] MILD difficulty breathing (e.g., minimal/no SOB at rest, SOB with walking, pulse <100) AND [2] NEW-onset or WORSE than normal  Answer Assessment - Initial Assessment Questions 1. RESPIRATORY STATUS: "Describe your breathing?" (e.g., wheezing, shortness of breath, unable to speak, severe coughing)      Some wheezing and shortness of breath 2. ONSET: "When did this breathing problem begin?"      yesterday 3. PATTERN "Does the difficult breathing come and go, or has it been constant since it started?"      Constant when he is moving 4. SEVERITY: "How bad is your breathing?" (e.g., mild, moderate, severe)    - MILD: No SOB at rest, mild SOB with walking, speaks normally in sentences, can lay down, no retractions, pulse < 100.    - MODERATE: SOB at rest, SOB with minimal exertion and prefers to sit, cannot lie down flat, speaks in phrases, mild retractions, audible wheezing, pulse 100-120.    - SEVERE: Very SOB at rest, speaks in single words, struggling to breathe, sitting hunched forward, retractions, pulse > 120      mild 5. RECURRENT SYMPTOM: "Have you had difficulty breathing before?" If so, ask: "When was the last time?" and "What happened that time?"      Not like this 6. CARDIAC HISTORY: "Do you have any history of heart disease?" (e.g., heart attack, angina, bypass surgery, angioplasty)       High blood pressure 7. LUNG HISTORY: "Do you have any history of lung disease?"  (e.g., pulmonary embolus, asthma, emphysema)     COPD (early onset) 8. CAUSE: "What do you think is causing the breathing problem?"      ? COPD, pollen 9. OTHER SYMPTOMS: "Do you have any other symptoms? (e.g., dizziness, runny nose, cough, chest pain, fever)     none 10. PREGNANCY: "Is there any chance you are pregnant?" "When was your last menstrual period?"       n/a 11. TRAVEL: "Have you traveled out of the country in the last month?" (e.g., travel history, exposures)       no  Protocols used: BREATHING DIFFICULTY-A-AH

## 2018-05-21 NOTE — Telephone Encounter (Signed)
SCHEDULED APPT 

## 2018-05-21 NOTE — Progress Notes (Signed)
Virtual Visit via Video Note  I connected with@ on 05/17/18 at  1:15 PM EDT by a video enabled telemedicine application and verified that I am speaking with the correct person using two identifiers.   I discussed the limitations of evaluation and management by telemedicine and the availability of in person appointments. The patient expressed understanding and agreed to proceed.  History of Present Illness: Cough Duration: 2 days  +hx of COPD Associated symptoms: wheezing, shortness of breath and cough Denies: sinus congestion, sinus pain, rhinorrhea, itchy watery eyes, ear pain, ear drainage, sore throat, chest tightness, myalgia and fevers Treatment to date: Tx'd for UTI Sick contacts: No   Observations/Objective: No conversational dyspnea Age appropriate judgment and insight Nml affect and mood  Assessment and Plan: COPD exacerbation ([place]) - Plan: predniSONE (DELTASONE) 20 MG tablet, umeclidinium bromide (INCRUSE ELLIPTA) 62.5 MCG/INH AEPB  Orders as above. Start LAMA, pred burst. F/u in 4 d, r/o cardio causes and ck labs at that visit if still having issues. I'd also like to listen to his lungs.   Follow Up Instructions: 4 d   I discussed the assessment and treatment plan with the patient. The patient was provided an opportunity to ask questions and all were answered. The patient agreed with the plan and demonstrated an understanding of the instructions.   The patient was advised to call back or seek an in-person evaluation if the symptoms worsen or if the condition fails to improve as anticipated.   [place], DO

## 2018-05-21 NOTE — Telephone Encounter (Signed)
Schedule appt with me. Ty.

## 2018-05-25 ENCOUNTER — Encounter: Payer: Self-pay | Admitting: Family Medicine

## 2018-05-25 ENCOUNTER — Encounter (HOSPITAL_BASED_OUTPATIENT_CLINIC_OR_DEPARTMENT_OTHER): Payer: Self-pay | Admitting: *Deleted

## 2018-05-25 ENCOUNTER — Emergency Department (HOSPITAL_BASED_OUTPATIENT_CLINIC_OR_DEPARTMENT_OTHER): Payer: BLUE CROSS/BLUE SHIELD

## 2018-05-25 ENCOUNTER — Inpatient Hospital Stay (HOSPITAL_BASED_OUTPATIENT_CLINIC_OR_DEPARTMENT_OTHER)
Admission: EM | Admit: 2018-05-25 | Discharge: 2018-05-30 | DRG: 308 | Disposition: A | Discharge status: Home / Self Care | Payer: BLUE CROSS/BLUE SHIELD | Attending: Internal Medicine | Admitting: Internal Medicine

## 2018-05-25 ENCOUNTER — Other Ambulatory Visit: Payer: Self-pay

## 2018-05-25 ENCOUNTER — Ambulatory Visit: Payer: BLUE CROSS/BLUE SHIELD | Admitting: Family Medicine

## 2018-05-25 VITALS — BP 140/88 | HR 82 | Temp 98.2°F | Wt 256.0 lb

## 2018-05-25 DIAGNOSIS — Z888 Allergy status to other drugs, medicaments and biological substances status: Secondary | ICD-10-CM | POA: Diagnosis not present

## 2018-05-25 DIAGNOSIS — Z87891 Personal history of nicotine dependence: Secondary | ICD-10-CM

## 2018-05-25 DIAGNOSIS — Z8249 Family history of ischemic heart disease and other diseases of the circulatory system: Secondary | ICD-10-CM | POA: Diagnosis not present

## 2018-05-25 DIAGNOSIS — I4892 Unspecified atrial flutter: Secondary | ICD-10-CM | POA: Diagnosis present

## 2018-05-25 DIAGNOSIS — E669 Obesity, unspecified: Secondary | ICD-10-CM | POA: Diagnosis not present

## 2018-05-25 DIAGNOSIS — J438 Other emphysema: Secondary | ICD-10-CM | POA: Diagnosis not present

## 2018-05-25 DIAGNOSIS — J441 Chronic obstructive pulmonary disease with (acute) exacerbation: Secondary | ICD-10-CM | POA: Diagnosis not present

## 2018-05-25 DIAGNOSIS — I5031 Acute diastolic (congestive) heart failure: Secondary | ICD-10-CM | POA: Diagnosis not present

## 2018-05-25 DIAGNOSIS — I10 Essential (primary) hypertension: Secondary | ICD-10-CM | POA: Diagnosis not present

## 2018-05-25 DIAGNOSIS — Z7952 Long term (current) use of systemic steroids: Secondary | ICD-10-CM | POA: Diagnosis not present

## 2018-05-25 DIAGNOSIS — I509 Heart failure, unspecified: Secondary | ICD-10-CM | POA: Diagnosis not present

## 2018-05-25 DIAGNOSIS — Z79899 Other long term (current) drug therapy: Secondary | ICD-10-CM

## 2018-05-25 DIAGNOSIS — Z720 Tobacco use: Secondary | ICD-10-CM | POA: Diagnosis not present

## 2018-05-25 DIAGNOSIS — J449 Chronic obstructive pulmonary disease, unspecified: Secondary | ICD-10-CM | POA: Diagnosis not present

## 2018-05-25 DIAGNOSIS — I4891 Unspecified atrial fibrillation: Secondary | ICD-10-CM | POA: Diagnosis present

## 2018-05-25 DIAGNOSIS — Z7951 Long term (current) use of inhaled steroids: Secondary | ICD-10-CM

## 2018-05-25 DIAGNOSIS — F17200 Nicotine dependence, unspecified, uncomplicated: Secondary | ICD-10-CM | POA: Diagnosis present

## 2018-05-25 DIAGNOSIS — Z6836 Body mass index (BMI) 36.0-36.9, adult: Secondary | ICD-10-CM

## 2018-05-25 DIAGNOSIS — Z885 Allergy status to narcotic agent status: Secondary | ICD-10-CM

## 2018-05-25 DIAGNOSIS — R0609 Other forms of dyspnea: Secondary | ICD-10-CM | POA: Diagnosis not present

## 2018-05-25 DIAGNOSIS — Z9119 Patient's noncompliance with other medical treatment and regimen: Secondary | ICD-10-CM

## 2018-05-25 DIAGNOSIS — R0602 Shortness of breath: Secondary | ICD-10-CM

## 2018-05-25 DIAGNOSIS — I361 Nonrheumatic tricuspid (valve) insufficiency: Secondary | ICD-10-CM | POA: Diagnosis not present

## 2018-05-25 DIAGNOSIS — Z7982 Long term (current) use of aspirin: Secondary | ICD-10-CM | POA: Diagnosis not present

## 2018-05-25 DIAGNOSIS — I110 Hypertensive heart disease with heart failure: Secondary | ICD-10-CM | POA: Diagnosis not present

## 2018-05-25 DIAGNOSIS — E785 Hyperlipidemia, unspecified: Secondary | ICD-10-CM | POA: Diagnosis present

## 2018-05-25 DIAGNOSIS — I4819 Other persistent atrial fibrillation: Secondary | ICD-10-CM | POA: Diagnosis not present

## 2018-05-25 HISTORY — DX: Acute diastolic (congestive) heart failure: I50.31

## 2018-05-25 LAB — CBC
HCT: 45.2 % (ref 39.0–52.0)
Hemoglobin: 14.7 g/dL (ref 13.0–17.0)
MCH: 30.8 pg (ref 26.0–34.0)
MCHC: 32.5 g/dL (ref 30.0–36.0)
MCV: 94.6 fL (ref 80.0–100.0)
Platelets: 219 10*3/uL (ref 150–400)
RBC: 4.78 MIL/uL (ref 4.22–5.81)
RDW: 14 % (ref 11.5–15.5)
WBC: 11.2 10*3/uL — ABNORMAL HIGH (ref 4.0–10.5)
nRBC: 0 % (ref 0.0–0.2)

## 2018-05-25 LAB — CBC WITH DIFFERENTIAL/PLATELET
Abs Immature Granulocytes: 0.03 10*3/uL (ref 0.00–0.07)
Basophils Absolute: 0 10*3/uL (ref 0.0–0.1)
Basophils Relative: 0 %
Eosinophils Absolute: 0.1 10*3/uL (ref 0.0–0.5)
Eosinophils Relative: 0 %
HCT: 48.1 % (ref 39.0–52.0)
Hemoglobin: 15.3 g/dL (ref 13.0–17.0)
Immature Granulocytes: 0 %
Lymphocytes Relative: 11 %
Lymphs Abs: 1.2 10*3/uL (ref 0.7–4.0)
MCH: 30.8 pg (ref 26.0–34.0)
MCHC: 31.8 g/dL (ref 30.0–36.0)
MCV: 96.8 fL (ref 80.0–100.0)
Monocytes Absolute: 0.5 10*3/uL (ref 0.1–1.0)
Monocytes Relative: 4 %
Neutro Abs: 9.6 10*3/uL — ABNORMAL HIGH (ref 1.7–7.7)
Neutrophils Relative %: 85 %
Platelets: 259 10*3/uL (ref 150–400)
RBC: 4.97 MIL/uL (ref 4.22–5.81)
RDW: 14.3 % (ref 11.5–15.5)
WBC: 11.5 10*3/uL — ABNORMAL HIGH (ref 4.0–10.5)
nRBC: 0 % (ref 0.0–0.2)

## 2018-05-25 LAB — BRAIN NATRIURETIC PEPTIDE: B Natriuretic Peptide: 198.8 pg/mL — ABNORMAL HIGH (ref 0.0–100.0)

## 2018-05-25 LAB — COMPREHENSIVE METABOLIC PANEL
ALT: 66 U/L — ABNORMAL HIGH (ref 0–44)
AST: 27 U/L (ref 15–41)
Albumin: 3.9 g/dL (ref 3.5–5.0)
Alkaline Phosphatase: 83 U/L (ref 38–126)
Anion gap: 8 (ref 5–15)
BUN: 21 mg/dL (ref 8–23)
CO2: 21 mmol/L — ABNORMAL LOW (ref 22–32)
Calcium: 9.3 mg/dL (ref 8.9–10.3)
Chloride: 108 mmol/L (ref 98–111)
Creatinine, Ser: 0.94 mg/dL (ref 0.61–1.24)
GFR calc Af Amer: >60 mL/min (ref >60)
GFR calc non Af Amer: >60 mL/min (ref >60)
Glucose, Bld: 121 mg/dL — ABNORMAL HIGH (ref 70–99)
Potassium: 4.3 mmol/L (ref 3.5–5.1)
Sodium: 137 mmol/L (ref 135–145)
Total Bilirubin: 2.3 mg/dL — ABNORMAL HIGH (ref 0.3–1.2)
Total Protein: 7.2 g/dL (ref 6.5–8.1)

## 2018-05-25 LAB — D-DIMER, QUANTITATIVE: D-Dimer, Quant: 0.64 ug/mL-FEU — ABNORMAL HIGH (ref 0.00–0.50)

## 2018-05-25 LAB — CREATININE, SERUM
Creatinine, Ser: 0.89 mg/dL (ref 0.61–1.24)
GFR calc Af Amer: >60 mL/min (ref >60)
GFR calc non Af Amer: >60 mL/min (ref >60)

## 2018-05-25 LAB — TROPONIN I
Troponin I: <0.03 ng/mL (ref <0.03)
Troponin I: <0.03 ng/mL (ref <0.03)

## 2018-05-25 LAB — TSH
TSH: 0.548 u[IU]/mL (ref 0.350–4.500)
TSH: 0.43 u[IU]/mL (ref 0.350–4.500)

## 2018-05-25 LAB — PROTIME-INR
INR: 1.1 (ref 0.8–1.2)
Prothrombin Time: 13.8 seconds (ref 11.4–15.2)

## 2018-05-25 LAB — MAGNESIUM: Magnesium: 2.2 mg/dL (ref 1.7–2.4)

## 2018-05-25 LAB — APTT: aPTT: 29 seconds (ref 24–36)

## 2018-05-25 LAB — PHOSPHORUS: Phosphorus: 4.8 mg/dL — ABNORMAL HIGH (ref 2.5–4.6)

## 2018-05-25 MED ORDER — HEPARIN BOLUS VIA INFUSION
5000.0000 [IU] | Freq: Once | INTRAVENOUS | Status: AC
  Administered 2018-05-25: 5000 [IU] via INTRAVENOUS

## 2018-05-25 MED ORDER — FUROSEMIDE 10 MG/ML IJ SOLN
40.0000 mg | Freq: Two times a day (BID) | INTRAMUSCULAR | Status: DC
  Administered 2018-05-25 – 2018-05-26 (×2): 40 mg via INTRAVENOUS
  Filled 2018-05-25 (×2): qty 4

## 2018-05-25 MED ORDER — DILTIAZEM LOAD VIA INFUSION
15.0000 mg | Freq: Once | INTRAVENOUS | Status: AC
  Administered 2018-05-25: 15 mg via INTRAVENOUS
  Filled 2018-05-25: qty 15

## 2018-05-25 MED ORDER — ENOXAPARIN SODIUM 40 MG/0.4ML ~~LOC~~ SOLN
40.0000 mg | SUBCUTANEOUS | Status: DC
  Administered 2018-05-26: 11:00:00 40 mg via SUBCUTANEOUS
  Filled 2018-05-25: qty 0.4

## 2018-05-25 MED ORDER — DILTIAZEM HCL 100 MG IV SOLR
INTRAVENOUS | Status: AC
  Filled 2018-05-25: qty 100

## 2018-05-25 MED ORDER — IOHEXOL 350 MG/ML SOLN
100.0000 mL | Freq: Once | INTRAVENOUS | Status: AC | PRN
  Administered 2018-05-25: 88 mL via INTRAVENOUS

## 2018-05-25 MED ORDER — MOMETASONE FURO-FORMOTEROL FUM 100-5 MCG/ACT IN AERO
2.0000 | INHALATION_SPRAY | Freq: Two times a day (BID) | RESPIRATORY_TRACT | Status: DC
  Administered 2018-05-26 – 2018-05-30 (×9): 2 via RESPIRATORY_TRACT
  Filled 2018-05-25: qty 8.8

## 2018-05-25 MED ORDER — AMLODIPINE BESYLATE-VALSARTAN 5-320 MG PO TABS: 1.0000 | ORAL_TABLET | Freq: Every day | ORAL | 2 refills | Status: DC

## 2018-05-25 MED ORDER — ALBUTEROL SULFATE (2.5 MG/3ML) 0.083% IN NEBU: 3.0000 mL | INHALATION_SOLUTION | Freq: Four times a day (QID) | RESPIRATORY_TRACT | Status: DC | PRN

## 2018-05-25 MED ORDER — FLUTICASONE PROPIONATE 50 MCG/ACT NA SUSP
2.0000 | Freq: Every day | NASAL | Status: DC
  Administered 2018-05-26 – 2018-05-30 (×5): 2 via NASAL
  Filled 2018-05-25: qty 16

## 2018-05-25 MED ORDER — DILTIAZEM HCL 100 MG IV SOLR
5.0000 mg/h | INTRAVENOUS | Status: DC
  Administered 2018-05-25: 5 mg/h via INTRAVENOUS
  Filled 2018-05-25 (×3): qty 100

## 2018-05-25 MED ORDER — HEPARIN (PORCINE) 25000 UT/250ML-% IV SOLN
1400.0000 [IU]/h | INTRAVENOUS | Status: DC
  Administered 2018-05-25: 1400 [IU]/h via INTRAVENOUS
  Filled 2018-05-25: qty 250

## 2018-05-25 MED ORDER — SODIUM CHLORIDE 0.9 % IV BOLUS
500.0000 mL | Freq: Once | INTRAVENOUS | Status: AC
  Administered 2018-05-25: 15:00:00 via INTRAVENOUS

## 2018-05-25 MED ORDER — ASPIRIN EC 81 MG PO TBEC
81.0000 mg | DELAYED_RELEASE_TABLET | Freq: Every day | ORAL | Status: DC
  Administered 2018-05-26 – 2018-05-28 (×3): 81 mg via ORAL
  Filled 2018-05-25 (×3): qty 1

## 2018-05-25 MED ORDER — UMECLIDINIUM BROMIDE 62.5 MCG/INH IN AEPB
1.0000 | INHALATION_SPRAY | Freq: Every day | RESPIRATORY_TRACT | Status: DC
  Administered 2018-05-26 – 2018-05-30 (×5): 1 via RESPIRATORY_TRACT
  Filled 2018-05-25: qty 7

## 2018-05-25 NOTE — ED Notes (Signed)
Pt denies chest pain, denies feeling of palpitations, only complaint is fatigue and a feeling of "catching my breath".  Pt states has been working extra hours recently and attributed symptoms to being tired from activity.  Denies nausea, no visible diaphoresis

## 2018-05-25 NOTE — ED Triage Notes (Signed)
He was examined by his MD today for sob x 2 weeks. No cough. His heart rate was 150 and he was told to come to the ED.

## 2018-05-25 NOTE — ED Notes (Signed)
Pt remains free of chest pain, reports some relief of shortness of breath after beginning cardizem.  Awaiting bed placement for inpatient admission.

## 2018-05-25 NOTE — Progress Notes (Signed)
63 yo Hx of COPD, current tobacco use presents to [facility] with complaints of worsening non exertional dyspnea of 2 weeks duration. Had a Televisit a week ago by his PCP for which he was treated for COPD exacerbation. Steroids prescribed. No improvement of symptoms.  In the ED at [facility] found to be in new Afib with RVR rate in the 150-160's elevated D-dimer with negative CTA chest. Cardizem bolus given and currently on cardizem drip. Rate fluctuating, tachy-brady. Initially on hep drip which was dc. Chadsvasc score 1 and negative trop.   No reported fever, sick contacts, or COVID-19 exposure.  Will admit to [facility] as inpatient status to telemetry unit due to new onset Afib with RVR. Please consult cardiology on arrival.

## 2018-05-25 NOTE — ED Notes (Signed)
Report provided to receiving nurse, Clydene Laming

## 2018-05-25 NOTE — ED Provider Notes (Signed)
  Physical Exam  BP 130/77   Pulse (!) 44   Temp 98.4 F (36.9 C) (Oral)   Resp 19   Ht 5\' 10"  (1.778 m)   Wt 116.1 kg   SpO2 96%   BMI 36.73 kg/m   Physical Exam  ED Course/Procedures     Procedures  MDM  Care assumed from Dr. Laverta Baltimore. Patient recently seen for COPD exacerbation now has rapid afib with RVR. No hx of afib and not on anticoagulation. Started on cardizem drip. Sign out pending d-dimer, labs. Heparin started for possible ACS but trop is pending. His CHADVASC is 1.   4:45 PM D-dimer positive. CTA negative. HR still 120s on 10 mg/hr of cardizem. Since trop negative and CHADVASC is 1, will stop heparin and continue cardizem drip. Hospitalist to admit for new onset afib with RVR.   CRITICAL CARE Performed by: Wandra Arthurs   Total critical care time: 30  minutes  Critical care time was exclusive of separately billable procedures and treating other patients.  Critical care was necessary to treat or prevent imminent or life-threatening deterioration.  Critical care was time spent personally by me on the following activities: development of treatment plan with patient and/or surrogate as well as nursing, discussions with consultants, evaluation of patient's response to treatment, examination of patient, obtaining history from patient or surrogate, ordering and performing treatments and interventions, ordering and review of laboratory studies, ordering and review of radiographic studies, pulse oximetry and re-evaluation of patient's condition.      Drenda Freeze, MD 05/25/18 [phone]

## 2018-05-25 NOTE — ED Notes (Signed)
Pt on monitor 

## 2018-05-25 NOTE — ED Provider Notes (Signed)
Emergency Department Provider Note   I have reviewed the triage vital signs and the nursing notes.   HISTORY  Chief Complaint Shortness of Breath   HPI Mitchell Parrish is a 63 y.o. male with PMH of HTN and HLD presents to the emergency department for evaluation of shortness of breath.  He initially was seen by his PCP after attempts to control his shortness of breath by telemedicine were unsuccessful.  Patient does smoke cigarettes and was being treated for possible COPD exacerbation.  He reports 2 weeks of symptoms.  He has shortness of breath without chest pain.  He denies any fevers, cough, body aches.  No sick contacts.  Patient denies any lightheadedness or near syncope.  No prior history of atrial fibrillation.  He was seen by his PCP today and found to have a heart rate of 150 and rhythm consistent with atrial fibrillation with RVR.  Patient was directed to the emergency department and his PCP called ahead to review the case.    Past Medical History:  Diagnosis Date  . Arthritis    left hip  . Hyperlipidemia   . Hypertension     Patient Active Problem List   Diagnosis Date Noted  . Chronic obstructive pulmonary disease ([place])   . Other emphysema ([place])   . Smoker   . Atrial fibrillation with RVR ([place]) 05/25/2018  . Atrial flutter with rapid ventricular response ([place]) 05/25/2018  . BPH (benign prostatic hyperplasia) 03/24/2016  . Other and unspecified hyperlipidemia 10/30/2012  . Hip pain, chronic 10/30/2012  . Degeneration of cervical intervertebral disc 10/30/2012  . Insomnia 10/30/2012  . Numbness of left anterior thigh 03/15/2012  . Seborrheic keratosis 09/27/2011  . Hypertension 09/26/2011  . Obesity (BMI 30-39.9) 09/26/2011  . Gilbert's syndrome 09/26/2011  . Nicotine abuse 09/26/2011    Past Surgical History:  Procedure Laterality Date  . BACK SURGERY    . HERNIA REPAIR    . SPINE SURGERY     cervical fusion c6, c7 (1994), and c3,4,5,6 (2004)     Allergies Lodine [etodolac] and Percocet [oxycodone-acetaminophen]  Family History  Problem Relation Age of Onset  . Diabetes Mother   . Lung cancer Mother 78  . Diabetes Father   . Heart disease Father   . Colon cancer Father 67       rectal cancer    Social History Social History   Tobacco Use  . Smoking status: Former Smoker    Packs/day: 1.00    Years: 45.00    Pack years: 45.00    Types: E-cigarettes    Last attempt to quit: 03/15/2016    Years since quitting: 2.2  . Smokeless tobacco: Never Used  Substance Use Topics  . Alcohol use: Yes    Comment: 2-3 drinks/ day  . Drug use: No    Review of Systems  Constitutional: No fever/chills Eyes: No visual changes. ENT: No sore throat. Cardiovascular: Denies chest pain. Elevated HR per PCP.  Respiratory: Positive shortness of breath. Gastrointestinal: No abdominal pain.  No nausea, no vomiting.  No diarrhea.  No constipation. Genitourinary: Negative for dysuria. Musculoskeletal: Negative for back pain. Skin: Negative for rash. Neurological: Negative for headaches, focal weakness or numbness.  10-point ROS otherwise negative.  ____________________________________________   PHYSICAL EXAM:  VITAL SIGNS: ED Triage Vitals  Enc Vitals Group     BP --      Pulse Rate 05/25/18 1449 (!) 167     Resp 05/25/18 1449 16  Temp 05/25/18 1449 98.4 F (36.9 C)     Temp Source 05/25/18 1449 Oral     SpO2 05/25/18 1449 100 %     Weight 05/25/18 1437 256 lb (116.1 kg)     Height 05/25/18 1437 5\' 10"  (1.778 m)     Pain Score 05/25/18 1437 0   Constitutional: Alert and oriented. Well appearing and in no acute distress. Eyes: Conjunctivae are normal.  Head: Atraumatic. Nose: No congestion/rhinnorhea. Mouth/Throat: Mucous membranes are moist.  O Neck: No stridor.  Cardiovascular: A-fib with RVR. Good peripheral circulation. Grossly normal heart sounds.   Respiratory: Normal respiratory effort.  No retractions. Lungs  CTAB. Gastrointestinal: Soft and nontender. No distention.  Musculoskeletal: No lower extremity tenderness nor edema. No gross deformities of extremities. Neurologic:  Normal speech and language. No gross focal neurologic deficits are appreciated.  Skin:  Skin is warm, dry and intact. No rash noted.  ____________________________________________   LABS (all labs ordered are listed, but only abnormal results are displayed)  Labs Reviewed  COMPREHENSIVE METABOLIC PANEL - Abnormal; Notable for the following components:      Result Value   CO2 21 (*)    Glucose, Bld 121 (*)    ALT 66 (*)    Total Bilirubin 2.3 (*)    All other components within normal limits  CBC WITH DIFFERENTIAL/PLATELET - Abnormal; Notable for the following components:   WBC 11.5 (*)    Neutro Abs 9.6 (*)    All other components within normal limits  D-DIMER, QUANTITATIVE (NOT AT [facility]) - Abnormal; Notable for the following components:   D-Dimer, Quant 0.64 (*)    All other components within normal limits  CBC - Abnormal; Notable for the following components:   WBC 11.2 (*)    All other components within normal limits  PHOSPHORUS - Abnormal; Notable for the following components:   Phosphorus 4.8 (*)    All other components within normal limits  BRAIN NATRIURETIC PEPTIDE - Abnormal; Notable for the following components:   B Natriuretic Peptide 198.8 (*)    All other components within normal limits  BASIC METABOLIC PANEL - Abnormal; Notable for the following components:   Glucose, Bld 113 (*)    All other components within normal limits  CBC - Abnormal; Notable for the following components:   WBC 11.7 (*)    All other components within normal limits  COMPREHENSIVE METABOLIC PANEL - Abnormal; Notable for the following components:   Glucose, Bld 110 (*)    Calcium 8.5 (*)    Total Protein 6.1 (*)    Albumin 3.4 (*)    ALT 60 (*)    Total Bilirubin 1.8 (*)    All other components within normal limits  LIPID  PANEL - Abnormal; Notable for the following components:   HDL 37 (*)    LDL Cholesterol 100 (*)    All other components within normal limits  MRSA PCR SCREENING  TROPONIN I  PROTIME-INR  TSH  APTT  HIV ANTIBODY (ROUTINE TESTING W REFLEX)  CREATININE, SERUM  MAGNESIUM  TSH  TROPONIN I  TROPONIN I  CBC WITH DIFFERENTIAL/PLATELET  MAGNESIUM  TSH  CBC  BASIC METABOLIC PANEL  MAGNESIUM   ____________________________________________  EKG   EKG Interpretation  Date/Time:  Friday May 25 2018 14:47:38 EDT Ventricular Rate:  156 PR Interval:    QRS Duration: 79 QT Interval:  284 QTC Calculation: 437 R Axis:   90 Text Interpretation:  Atrial fibrillation Borderline right axis deviation  No STEMI.  Confirmed by Nanda Quinton [phone]) on 05/25/2018 2:54:14 PM       ____________________________________________  RADIOLOGY  CXR reviewed.  ____________________________________________   PROCEDURES  Procedure(s) performed:   Procedures  CRITICAL CARE Performed by: Margette Fast Total critical care time: 35 minutes Critical care time was exclusive of separately billable procedures and treating other patients. Critical care was necessary to treat or prevent imminent or life-threatening deterioration. Critical care was time spent personally by me on the following activities: development of treatment plan with patient and/or surrogate as well as nursing, discussions with consultants, evaluation of patient's response to treatment, examination of patient, obtaining history from patient or surrogate, ordering and performing treatments and interventions, ordering and review of laboratory studies, ordering and review of radiographic studies, pulse oximetry and re-evaluation of patient's condition.  Nanda Quinton, MD Emergency Medicine  ____________________________________________   INITIAL IMPRESSION / ASSESSMENT AND PLAN / ED COURSE  Pertinent labs & imaging results that were  available during my care of the patient were reviewed by me and considered in my medical decision making (see chart for details).   Patient sent by his PCP for evaluation of shortness of breath with A. fib/RVR in the office.  No chest pain.  Heart rate in the 150s to 160s.  EKG consistent with A. fib RVR.  Patient with no contraindications for anticoagulation.  Plan for rate control in the setting.  Unknown time of onset so not safe for cardioversion. Low PE risk by wells but will add d-dimer. No concern clinically for COVID.   CHA2DS2VASc score of 1.   Care transferred to Dr. Darl Householder.   I reviewed all nursing notes, vitals, pertinent old records, EKGs, labs, imaging (as available).  ____________________________________________  FINAL CLINICAL IMPRESSION(S) / ED DIAGNOSES  Final diagnoses:  Atrial fibrillation with RVR ([place])     MEDICATIONS GIVEN DURING THIS VISIT:  Medications  diltiazem (CARDIZEM) 100 MG injection (  Not Given 05/25/18 1500)  diltiazem (CARDIZEM) 100 MG injection (has no administration in time range)  aspirin EC tablet 81 mg (81 mg Oral Given 05/27/18 1023)  fluticasone (FLONASE) 50 MCG/ACT nasal spray 2 spray (2 sprays Each Nare Given 05/27/18 1026)  mometasone-formoterol (DULERA) 100-5 MCG/ACT inhaler 2 puff (2 puffs Inhalation Given 05/27/18 0852)  albuterol (PROVENTIL) (2.5 MG/3ML) 0.083% nebulizer solution 3 mL (has no administration in time range)  umeclidinium bromide (INCRUSE ELLIPTA) 62.5 MCG/INH 1 puff (1 puff Inhalation Given 05/27/18 0851)  furosemide (LASIX) injection 40 mg (40 mg Intravenous Given 05/27/18 0500)  nicotine polacrilex (NICORETTE) gum 2 mg (2 mg Oral Given 05/27/18 0226)  diltiazem (CARDIZEM CD) 24 hr capsule 240 mg (240 mg Oral Given 05/27/18 1023)  diltiazem (CARDIZEM) 100 mg in dextrose 5% 138mL (1 mg/mL) infusion (0 mg/hr Intravenous Stopped 05/27/18 1414)  apixaban (ELIQUIS) tablet 5 mg (5 mg Oral Given 05/27/18 1023)  sodium chloride 0.9 %  bolus 500 mL (0 mLs Intravenous Stopped 05/25/18 1559)  diltiazem (CARDIZEM) 1 mg/mL load via infusion 15 mg (15 mg Intravenous Bolus from Bag 05/25/18 1500)  heparin bolus via infusion 5,000 Units (5,000 Units Intravenous Bolus from Bag 05/25/18 1537)  iohexol (OMNIPAQUE) 350 MG/ML injection 100 mL (88 mLs Intravenous Contrast Given 05/25/18 1615)  ramelteon (ROZEREM) tablet 8 mg (8 mg Oral Given 05/27/18 0225)    Note:  This document was prepared using Dragon voice recognition software and may include unintentional dictation errors.  Nanda Quinton, MD Emergency Medicine    Armistead Sult, [facility],  MD 05/27/18 [id number]

## 2018-05-25 NOTE — Progress Notes (Signed)
ANTICOAGULATION CONSULT NOTE - Initial Consult  Pharmacy Consult for heparin  Indication: atrial fibrillation  Allergies  Allergen Reactions  . Lodine [Etodolac] Hives  . Percocet [Oxycodone-Acetaminophen] Swelling    Patient Measurements: Height: 5\' 10"  (177.8 cm) Weight: 256 lb (116.1 kg) IBW/kg (Calculated) : 73 Heparin Dosing Weight: 98 kg  Vital Signs: Temp: 98.4 F (36.9 C) (04/17 1449) Temp Source: Oral (04/17 1449) BP: 128/84 (04/17 1502) Pulse Rate: 70 (04/17 1502)  Labs: No results for input(s): HGB, HCT, PLT, APTT, LABPROT, INR, HEPARINUNFRC, HEPRLOWMOCWT, CREATININE, CKTOTAL, CKMB, TROPONINI in the last 72 hours.  CrCl cannot be calculated (Patient's most recent lab result is older than the maximum 21 days allowed.).   Medical History: Past Medical History:  Diagnosis Date  . Arthritis    left hip  . Hyperlipidemia   . Hypertension     Medications:  (Not in a hospital admission)    Goal of Therapy:  Heparin level 0.3-0.7 units/ml Monitor platelets by anticoagulation protocol: Yes   Plan:  Bolus with 5,000 units heparin and infusion at  1,400 units per hr (14 units/kg/hr)   Mitchell Parrish A [name] 05/25/2018,3:16 PM

## 2018-05-25 NOTE — Progress Notes (Signed)
Chief Complaint  Patient presents with  . Shortness of Breath    Subjective: Patient is a 63 y.o. male here for sob f/u.  Tx on 4/13 for COPD exacerbation. Reports around 60% improvement since starting pred and LAMA. Still having exertional dyspnea. No wt changes or local swelling in legs, denies current cough. +palpitation. No hx of arrhythmia.   BP elevated, hx of HTN, has been out of BP med for quite some time. Was well controlled on Exforge 5-320 mg/d. No AE's.   ROS: Heart: Denies chest pain  Lungs: +SOB   Past Medical History:  Diagnosis Date  . Arthritis    left hip  . Hyperlipidemia   . Hypertension     Objective: BP 140/88 (BP Location: Left Arm, Patient Position: Sitting, Cuff Size: Large)   Pulse 82   Temp 98.2 F (36.8 C) (Oral)   Wt 256 lb (116.1 kg)   SpO2 96%   BMI 35.70 kg/m  General: Awake, appears stated age HEENT: MMM, EOMi Heart: Iregularly irreg rhythm, Tachycardic, no LE edema, no JVD Lungs: Prolonged exp phase. CTAB, no rales, wheezes or rhonchi. No accessory muscle use Psych: Age appropriate judgment and insight, normal affect and mood  Assessment and Plan: Atrial fibrillation with RVR ([place])  Essential hypertension - Plan: amLODipine-valsartan (EXFORGE) 5-320 MG tablet, EKG 12-Lead  Other form of dyspnea - Plan: EKG 12-Lead  EKG shows A fib w RVR. Sent to ED. Spoke with provider. Likely the cause of sob in addition to presumed COPD.  Refill meds for BP, may be changed. The patient voiced understanding and agreement to the plan.  [place], DO 05/25/18  2:34 PM

## 2018-05-25 NOTE — H&P (Signed)
History and Physical  Mitchell Parrish IDP:[id number] DOB: [birth year] DOA: 05/25/2018  Referring physician: ER physician PCP: [name], DO  Outpatient Specialists:    Patient coming from: [name], ER at [facility]  Chief Complaint: Shortness of breath for 2 weeks.  HPI: Patient is a 63 year old male, moderately obese, with past medical history significant for hypertension, hyperlipidemia and arthritis.  Patient may also have undiagnosed OSA and OHS.  Patient has history of tobacco use.  Patient presents with 2-week history of shortness of breath.  Apparently, patient was being managed for possible COPD with exacerbation by the primary care provider via telehealth.  Patient was noted today to be in atrial fibrillation with heart rate of 150 bpm.  Patient was advised to come to the hospital for further assessment and management.  Patient was seen at [facility] ER at [facility], started on Cardizem drip, and has been transferred to the hospital for further assessment and management.  CTA chest done at the ER was negative for pulmonary embolism.  Troponin was within normal limits.  No headache, no neck pain, no fever or chills, no chest pain, no GI symptoms and no urinary symptoms.  Patient admitted to snoring at nighttime.  Patient will be admitted for further assessment and management.  ED Course: Patient was started on Cardizem drip.  CTA was negative for PE.  Pertinent labs: Chemistry reveals sodium of 137, potassium of 4.3, chloride 108, CO2 21, BUN of 21 with creatinine of 0.94 with blood sugar of 121.  Total bilirubin is 2.3.  Troponin is less than 0.03.  CBC reveals WBC of 11.5, hemoglobin of 15.3, hematocrit of 48.1, MCV of 96.8 with platelet coung of 259.  EKG: Independently reviewed.   Imaging: independently reviewed.   Review of Systems:  Negative for fever, visual changes, sore throat, rash, new muscle aches, chest pain, dysuria, bleeding, n/v/abdominal pain.  Past  Medical History:  Diagnosis Date  . Arthritis    left hip  . Hyperlipidemia   . Hypertension     Past Surgical History:  Procedure Laterality Date  . BACK SURGERY    . HERNIA REPAIR    . SPINE SURGERY     cervical fusion c6, c7 (1994), and c3,4,5,6 (2004)     reports that he quit smoking about 2 years ago. His smoking use included e-cigarettes. He has a 45.00 pack-year smoking history. He has never used smokeless tobacco. He reports current alcohol use. He reports that he does not use drugs.  Allergies  Allergen Reactions  . Lodine [Etodolac] Hives  . Percocet [Oxycodone-Acetaminophen] Swelling    Family History  Problem Relation Age of Onset  . Diabetes Mother   . Lung cancer Mother 10  . Diabetes Father   . Heart disease Father   . Colon cancer Father 66       rectal cancer     Prior to Admission medications   Medication Sig Start Date End Date Taking? Authorizing Provider  amLODipine-valsartan (EXFORGE) 5-320 MG tablet Take 1 tablet by mouth daily. 05/25/18   [name], DO  aspirin 81 MG tablet Take 81 mg by mouth daily.    [provider]  fluticasone (FLONASE) 50 MCG/ACT nasal spray Place 2 sprays into both nostrils daily. 05/09/18   Saguier, [name], PA-C  levocetirizine (XYZAL) 5 MG tablet Take 1 tablet (5 mg total) by mouth every evening. 05/09/18   Saguier, [name], PA-C  mirabegron ER (MYRBETRIQ) 25 MG TB24 tablet  Take 1 tablet (25 mg total) by mouth daily. 05/05/16   [name], DO  omeprazole (PRILOSEC) 20 MG capsule TAKE 1 CAPSULE BY MOUTH TWICE DAILY BEFORE A MEAL 03/21/17   Wendling, Crosby Oyster, DO  oxybutynin (DITROPAN-XL) 5 MG 24 hr tablet Take 1 tablet (5 mg total) by mouth at bedtime. 05/19/16   [name], DO  predniSONE (DELTASONE) 20 MG tablet Take 2 tablets (40 mg total) by mouth daily with breakfast for 5 days. 05/21/18 05/26/18  [name], DO  PROAIR HFA 108 [phone] Base) MCG/ACT inhaler INHALE 2  PUFFS INTO THE LUNGS EVERY 4 HOURS AS NEEDED FOR WHEEZING ORSHORTNESS OF BREATH 04/05/18   [name], DO  SYMBICORT 80-4.5 MCG/ACT inhaler INHALE 2 PUFFS INTO THE LUNGS TWICE DAILY 04/05/18   [name], DO  tamsulosin (FLOMAX) 0.4 MG CAPS capsule Take 1 capsule (0.4 mg total) by mouth daily after supper. 03/24/16   [name], DO  umeclidinium bromide (INCRUSE ELLIPTA) 62.5 MCG/INH AEPB Inhale 1 puff into the lungs daily. 05/21/18   [name], DO    Physical Exam: Vitals:   05/25/18 1900 05/25/18 1930 05/25/18 1945 05/25/18 2005  BP: (!) 127/101 119/90 (!) 130/109 109/89  Pulse: (!) 176 (!) 138 (!) 113 (!) 122  Resp: (!) 23 (!) 28 17 20   Temp:    98.2 F (36.8 C)  TempSrc:    Oral  SpO2: 96% 96% 95% 100%  Weight:      Height:         Constitutional:  . Appears calm and comfortable.  Moderately obese Eyes:  . No pallor.  Jaundice is equivocal.    ENMT:  . external ears, nose appear normal Neck:  . Neck is supple. No JVD Respiratory:  . Decreased air entry globally.   Cardiovascular:  . S1S2 . Minimal ankle swelling.    Abdomen:  . Abdomen is obese, soft and non tender. Organs are difficult to assess. Neurologic:  . Awake and alert. . Moves all limbs.  Wt Readings from Last 3 Encounters:  05/25/18 116.1 kg  05/25/18 116.1 kg  06/21/17 109.9 kg    I have personally reviewed following labs and imaging studies  Labs on Admission:  CBC: Recent Labs  Lab 05/25/18 1450  WBC 11.5*  NEUTROABS 9.6*  HGB 15.3  HCT 48.1  MCV 96.8  PLT 270   Basic Metabolic Panel: Recent Labs  Lab 05/25/18 1450  NA 137  K 4.3  CL 108  CO2 21*  GLUCOSE 121*  BUN 21  CREATININE 0.94  CALCIUM 9.3   Liver Function Tests: Recent Labs  Lab 05/25/18 1450  AST 27  ALT 66*  ALKPHOS 83  BILITOT 2.3*  PROT 7.2  ALBUMIN 3.9   No results for input(s): LIPASE, AMYLASE in the last 168 hours. No results for input(s): AMMONIA in  the last 168 hours. Coagulation Profile: Recent Labs  Lab 05/25/18 1450  INR 1.1   Cardiac Enzymes: Recent Labs  Lab 05/25/18 1450  TROPONINI <0.03   BNP (last 3 results) Recent Labs    06/21/17 1625  PROBNP 29.0   HbA1C: No results for input(s): HGBA1C in the last 72 hours. CBG: No results for input(s): GLUCAP in the last 168 hours. Lipid Profile: No results for input(s): CHOL, HDL, LDLCALC, TRIG, CHOLHDL, LDLDIRECT in the last 72 hours. Thyroid Function Tests: Recent Labs    05/25/18 1450  TSH 0.548   Anemia Panel: No  results for input(s): VITAMINB12, FOLATE, FERRITIN, TIBC, IRON, RETICCTPCT in the last 72 hours. Urine analysis:    Component Value Date/Time   BILIRUBINUR small 11/19/2012 1510   PROTEINUR neg 11/19/2012 1510   UROBILINOGEN 2.0 11/19/2012 1510   NITRITE neg 11/19/2012 1510   LEUKOCYTESUR Negative 11/19/2012 1510   Sepsis Labs: @LABRCNTIP (procalcitonin:4,lacticidven:4) )No results found for this or any previous visit (from the past 240 hour(s)).    Radiological Exams on Admission: Ct Angio Chest Pe W And/or Wo Contrast  Result Date: 05/25/2018 CLINICAL DATA:  Shortness of breath over the last 2 weeks. EXAM: CT ANGIOGRAPHY CHEST WITH CONTRAST TECHNIQUE: Multidetector CT imaging of the chest was performed using the standard protocol during bolus administration of intravenous contrast. Multiplanar CT image reconstructions and MIPs were obtained to evaluate the vascular anatomy. CONTRAST:  48mL OMNIPAQUE IOHEXOL 350 MG/ML SOLN COMPARISON:  Radiography same day.  CT 02/26/2014. FINDINGS: Cardiovascular: Heart size is normal. No visible coronary artery calcification. The aorta is of normal caliber without gross atherosclerotic change. Pulmonary arterial opacification is excellent. There are no pulmonary emboli. Mediastinum/Nodes: There is newly seen adenopathy in the right hilum and subcarinal region. The single largest node is a subcarinal node just to the  right of midline measuring 18 mm in diameter. Lungs/Pleura: There is interstitial prominence throughout the lungs. No evidence of consolidation, lobar collapse or pleural effusion. This could represent mild interstitial edema or interstitial pneumonitis. No evidence of focal mass lesion. Few small scattered emphysematous blebs. Upper Abdomen: Chronic benign low-density in the right lobe of the liver, unchanged since the previous study. Chronic right adrenal adenoma. No acute finding. Musculoskeletal: Ordinary mild degenerative changes affect the spine. Review of the MIP images confirms the above findings. IMPRESSION: No pulmonary emboli. Normal heart size. No significant atherosclerosis seen. Development of abnormal interstitial density throughout the lungs. Pattern could represent mild edema or interstitial pneumonitis. No pleural effusion. No dense consolidation or collapse. Newly seen nodal prominence of the right hilum and subcarinal region, not present in 2016. Largest subcarinal node measures 18 mm. No sign of a lung mass. Therefore, these nodes are felt to be reactive. Electronically Signed   By: Nelson Chimes M.D.   On: 05/25/2018 16:34   Dg Chest Portable 1 View  Result Date: 05/25/2018 CLINICAL DATA:  Shortness of breath and tachycardia EXAM: PORTABLE CHEST 1 VIEW COMPARISON:  Jun 21, 2017 FINDINGS: There is no edema or consolidation. Heart is upper normal in size with pulmonary vascularity normal. No adenopathy. No bone lesions. IMPRESSION: No edema or consolidation. Electronically Signed   By: Lowella Grip III M.D.   On: 05/25/2018 15:19    EKG: Independently reviewed.   Active Problems:   Atrial fibrillation with RVR (HCC)   Atrial flutter with rapid ventricular response (HCC)   Assessment/Plan Atrial fibrillation with rapid ventricular response: Continue Cardizem drip Check TSH Trend troponin Needs work-up for possible OSA/OHS Cha2ds2 vasc score of 1 Low threshold to consult  cardiology Management depend on hospital course  Shortness of breath: This could be secondary to A. fib with RVR. Cannot rule out associated CHF versus tach induced cardiomyopathy Will defer decision to proceed to echocardiogram to the cardiology team Check cardiac BNP  Moderate obesity: Diet and exercise.  Likely undiagnosed OSA/OHS: Further work-up on outpatient basis. May consider trying CPAP while in the hospital.  Hypertension: Continue to optimize.  Hyperlipidemia: Continue current medication  DVT prophylaxis: Subcu Lovenox Code Status: Full code Family Communication:  Disposition Plan: Home eventually  Consults called: Low threshold to consult cardiology Admission status: Already inpatient  Time spent: 65 minutes  Dana Allan, MD  [facility] Pager #: [phone] 7PM-7AM contact night coverage as above  05/25/2018, 10:00 PM

## 2018-05-25 NOTE — ED Notes (Signed)
Accompanied to CT on portable monitor

## 2018-05-26 ENCOUNTER — Inpatient Hospital Stay (HOSPITAL_COMMUNITY): Payer: BLUE CROSS/BLUE SHIELD

## 2018-05-26 DIAGNOSIS — F17200 Nicotine dependence, unspecified, uncomplicated: Secondary | ICD-10-CM

## 2018-05-26 DIAGNOSIS — Z720 Tobacco use: Secondary | ICD-10-CM | POA: Diagnosis present

## 2018-05-26 DIAGNOSIS — I361 Nonrheumatic tricuspid (valve) insufficiency: Secondary | ICD-10-CM

## 2018-05-26 DIAGNOSIS — J438 Other emphysema: Secondary | ICD-10-CM

## 2018-05-26 LAB — CBC
HCT: 45.7 % (ref 39.0–52.0)
Hemoglobin: 14.6 g/dL (ref 13.0–17.0)
MCH: 30.2 pg (ref 26.0–34.0)
MCHC: 31.9 g/dL (ref 30.0–36.0)
MCV: 94.4 fL (ref 80.0–100.0)
Platelets: 240 10*3/uL (ref 150–400)
RBC: 4.84 MIL/uL (ref 4.22–5.81)
RDW: 14 % (ref 11.5–15.5)
WBC: 11.7 10*3/uL — ABNORMAL HIGH (ref 4.0–10.5)
nRBC: 0 % (ref 0.0–0.2)

## 2018-05-26 LAB — BASIC METABOLIC PANEL
Anion gap: 11 (ref 5–15)
BUN: 17 mg/dL (ref 8–23)
CO2: 23 mmol/L (ref 22–32)
Calcium: 9.1 mg/dL (ref 8.9–10.3)
Chloride: 103 mmol/L (ref 98–111)
Creatinine, Ser: 1.04 mg/dL (ref 0.61–1.24)
GFR calc Af Amer: >60 mL/min (ref >60)
GFR calc non Af Amer: >60 mL/min (ref >60)
Glucose, Bld: 113 mg/dL — ABNORMAL HIGH (ref 70–99)
Potassium: 4.4 mmol/L (ref 3.5–5.1)
Sodium: 137 mmol/L (ref 135–145)

## 2018-05-26 LAB — ECHOCARDIOGRAM COMPLETE
Height: 70 in
Weight: 4096 oz

## 2018-05-26 LAB — TROPONIN I: Troponin I: <0.03 ng/mL (ref <0.03)

## 2018-05-26 LAB — MRSA PCR SCREENING: MRSA by PCR: NEGATIVE

## 2018-05-26 LAB — HIV ANTIBODY (ROUTINE TESTING W REFLEX): HIV Screen 4th Generation wRfx: NONREACTIVE

## 2018-05-26 MED ORDER — DILTIAZEM HCL-DEXTROSE 100-5 MG/100ML-% IV SOLN (PREMIX)
5.0000 mg/h | INTRAVENOUS | Status: DC
  Administered 2018-05-26 (×2): 10 mg/h via INTRAVENOUS
  Administered 2018-05-26: 03:00:00 15 mg/h via INTRAVENOUS
  Administered 2018-05-27: 10 mg/h via INTRAVENOUS
  Filled 2018-05-26 (×5): qty 100

## 2018-05-26 MED ORDER — FUROSEMIDE 10 MG/ML IJ SOLN
40.0000 mg | Freq: Every day | INTRAMUSCULAR | Status: AC
  Administered 2018-05-27 – 2018-05-28 (×2): 40 mg via INTRAVENOUS
  Filled 2018-05-26 (×2): qty 4

## 2018-05-26 NOTE — Progress Notes (Signed)
PROGRESS NOTE  Mitchell Parrish RWE:[id number] DOB: [birth year] DOA: 05/25/2018 PCP: Mitchell Pal, DO   Patient coming from: Mitchell Parrish, ER at [facility]  Chief Complaint: Shortness of breath for 2 weeks.  HPI: Patient is a 63 year old male, moderately obese, with past medical history significant for hypertension, hyperlipidemia and arthritis.  Patient may also have undiagnosed OSA and OHS.  Patient has history of tobacco use.  Patient presents with 2-week history of shortness of breath.  Apparently, patient was being managed for possible COPD with exacerbation by the primary care provider via telehealth.  Patient was noted today to be in atrial fibrillation with heart rate of 150 bpm.  Patient was advised to come to the hospital for further assessment and management.  Patient was seen at [facility] ER at [facility], started on Cardizem drip, and has been transferred to the hospital for further assessment and management.  CTA chest done at the ER was negative for pulmonary embolism.  Troponin was within normal limits.  No headache, no neck pain, no fever or chills, no chest pain, no GI symptoms and no urinary symptoms.  Patient admitted to snoring at nighttime.  Patient will be admitted for further assessment and management.  ED Course: Patient was started on Cardizem drip.  CTA was negative for PE.  Pertinent labs: Chemistry reveals sodium of 137, potassium of 4.3, chloride 108, CO2 21, BUN of 21 with creatinine of 0.94 with blood sugar of 121.  Total bilirubin is 2.3.  Troponin is less than 0.03.  CBC reveals WBC of 11.5, hemoglobin of 15.3, hematocrit of 48.1, MCV of 96.8 with platelet coung of 259.   HPI/Recap of past 24 hours:  On cardizem drip, remain in afib, rate better controlled, bp stable He denies chest pain, no hypoxia, no cough, no edema, no fever Reports sobx3weeks, lung clear He responded to lasix well, with great urine output,  Assessment/Plan: Active  Problems:   Atrial fibrillation with RVR (HCC)   Atrial flutter with rapid ventricular response (HCC)  New onset of afib -Presents with sob for three weeks, he was treated for copd by pcp through tele visit, his lung exam is clear, cxr no acute findings, I think sob is mostly due to new onset of afib cardizem drip -check Echo. tsh -Cardiology consulted, will defer cardiology regarding anticoagulation, currently he is on asa 81mg  daily , CHADSVASc score 1 with h/o HTN.  Ankle edema per admission MD He is started on lasix 40mg  bid on admission, great urine output, no edema on my exam Lasix dose decreased to 40mg  daily, monitor blood pressure, bp F/u with echocardiogram  HTN -bp stable on cardizem drip  COPD in a smoker  stable cxr clear No wheezing, no cough, no hypoxia,  Alcohol use, from 2-12 beer on most of the days  Smoker: smoking cessation education provided, he declined nicotine patch  Obesity Body mass index is 36.73 kg/m.  Recommend outpatient sleep study to r/o OSA, he reports does not sleep well at night, wife states he snores loudly   Code Status: full  Family Communication: patient  And wife over the phone  Disposition Plan: home in 1-2 days with cardiology clearance   Consultants:  Cardiology, message sent through epic in basket   Procedures:  none  Antibiotics:  none   Objective: BP 115/82   Pulse 61   Temp 97.7 F (36.5 C) (Oral)   Resp (!) 21   Ht 5\' 10"  (1.778 m)  Wt 116.1 kg   SpO2 97%   BMI 36.73 kg/m   Intake/Output Summary (Last 24 hours) at 05/26/2018 1139 Last data filed at 05/26/2018 0900 Gross per 24 hour  Intake 1071.25 ml  Output 2875 ml  Net -1803.75 ml   Filed Weights   05/25/18 1437  Weight: 116.1 kg    Exam: Patient is examined daily including today on 05/26/2018, exams remain the same as of yesterday except that has changed    General:  NAD  Cardiovascular: IRRR  Respiratory: CTABL  Abdomen: Soft/ND/NT,  positive BS  Musculoskeletal: No Edema  Neuro: alert, oriented   Data Reviewed: Basic Metabolic Panel: Recent Labs  Lab 05/25/18 1450 05/25/18 2248 05/26/18 0054  NA 137  --  137  K 4.3  --  4.4  CL 108  --  103  CO2 21*  --  23  GLUCOSE 121*  --  113*  BUN 21  --  17  CREATININE 0.94 0.89 1.04  CALCIUM 9.3  --  9.1  MG  --  2.2  --   PHOS  --  4.8*  --    Liver Function Tests: Recent Labs  Lab 05/25/18 1450  AST 27  ALT 66*  ALKPHOS 83  BILITOT 2.3*  PROT 7.2  ALBUMIN 3.9   No results for input(s): LIPASE, AMYLASE in the last 168 hours. No results for input(s): AMMONIA in the last 168 hours. CBC: Recent Labs  Lab 05/25/18 1450 05/25/18 2248 05/26/18 0054  WBC 11.5* 11.2* 11.7*  NEUTROABS 9.6*  --   --   HGB 15.3 14.7 14.6  HCT 48.1 45.2 45.7  MCV 96.8 94.6 94.4  PLT 259 219 240   Cardiac Enzymes:   Recent Labs  Lab 05/25/18 1450 05/25/18 2248 05/26/18 0054  TROPONINI <0.03 <0.03 <0.03   BNP (last 3 results) Recent Labs    05/25/18 2248  BNP 198.8*    ProBNP (last 3 results) Recent Labs    06/21/17 1625  PROBNP 29.0    CBG: No results for input(s): GLUCAP in the last 168 hours.  Recent Results (from the past 240 hour(s))  MRSA PCR Screening     Status: None   Collection Time: 05/25/18  9:02 PM  Result Value Ref Range Status   MRSA by PCR NEGATIVE NEGATIVE Final    Comment:        The GeneXpert MRSA Assay (FDA approved for NASAL specimens only), is one component of a comprehensive MRSA colonization surveillance program. It is not intended to diagnose MRSA infection nor to guide or monitor treatment for MRSA infections. Performed at [facility], [place] [address], [place], [place] [address]      Studies: Ct Angio Chest Pe W And/or Wo Contrast  Result Date: 05/25/2018 CLINICAL DATA:  Shortness of breath over the last 2 weeks. EXAM: CT ANGIOGRAPHY CHEST WITH CONTRAST TECHNIQUE: Multidetector CT imaging of the chest was  performed using the standard protocol during bolus administration of intravenous contrast. Multiplanar CT image reconstructions and MIPs were obtained to evaluate the vascular anatomy. CONTRAST:  63mL OMNIPAQUE IOHEXOL 350 MG/ML SOLN COMPARISON:  Radiography same day.  CT 02/26/2014. FINDINGS: Cardiovascular: Heart size is normal. No visible coronary artery calcification. The aorta is of normal caliber without gross atherosclerotic change. Pulmonary arterial opacification is excellent. There are no pulmonary emboli. Mediastinum/Nodes: There is newly seen adenopathy in the right hilum and subcarinal region. The single largest node is a subcarinal node just to the right  of midline measuring 18 mm in diameter. Lungs/Pleura: There is interstitial prominence throughout the lungs. No evidence of consolidation, lobar collapse or pleural effusion. This could represent mild interstitial edema or interstitial pneumonitis. No evidence of focal mass lesion. Few small scattered emphysematous blebs. Upper Abdomen: Chronic benign low-density in the right lobe of the liver, unchanged since the previous study. Chronic right adrenal adenoma. No acute finding. Musculoskeletal: Ordinary mild degenerative changes affect the spine. Review of the MIP images confirms the above findings. IMPRESSION: No pulmonary emboli. Normal heart size. No significant atherosclerosis seen. Development of abnormal interstitial density throughout the lungs. Pattern could represent mild edema or interstitial pneumonitis. No pleural effusion. No dense consolidation or collapse. Newly seen nodal prominence of the right hilum and subcarinal region, not present in 2016. Largest subcarinal node measures 18 mm. No sign of a lung mass. Therefore, these nodes are felt to be reactive. Electronically Signed   By: Nelson Chimes M.D.   On: 05/25/2018 16:34   Dg Chest Portable 1 View  Result Date: 05/25/2018 CLINICAL DATA:  Shortness of breath and tachycardia EXAM:  PORTABLE CHEST 1 VIEW COMPARISON:  Jun 21, 2017 FINDINGS: There is no edema or consolidation. Heart is upper normal in size with pulmonary vascularity normal. No adenopathy. No bone lesions. IMPRESSION: No edema or consolidation. Electronically Signed   By: Lowella Grip III M.D.   On: 05/25/2018 15:19    Scheduled Meds: . aspirin EC  81 mg Oral Daily  . enoxaparin (LOVENOX) injection  40 mg Subcutaneous Q24H  . fluticasone  2 spray Each Nare Daily  . furosemide  40 mg Intravenous Q12H  . mometasone-formoterol  2 puff Inhalation BID  . umeclidinium bromide  1 puff Inhalation Daily    Continuous Infusions: . diltiazem (CARDIZEM) infusion 10 mg/hr (05/26/18 0430)     Time spent: 66mins I have personally reviewed and interpreted on  05/26/2018 daily labs, tele strips, imagings as discussed above under date review session and assessment and plans.  I reviewed all nursing notes, pharmacy notes, vitals, pertinent old records  I have discussed plan of care as described above with RN , patient and family on 05/26/2018   Florencia Reasons MD, PhD  [facility] Pager [phone]. If 7PM-7AM, please contact night-coverage at [email or web address], password [email or web address] 05/26/2018, 11:39 AM  LOS: 1 day

## 2018-05-26 NOTE — Progress Notes (Signed)
  Echocardiogram 2D Echocardiogram has been performed.  Sheriden Archibeque G Giann Obara 05/26/2018, 10:27 AM

## 2018-05-27 DIAGNOSIS — I509 Heart failure, unspecified: Secondary | ICD-10-CM

## 2018-05-27 DIAGNOSIS — I4891 Unspecified atrial fibrillation: Secondary | ICD-10-CM

## 2018-05-27 DIAGNOSIS — J449 Chronic obstructive pulmonary disease, unspecified: Secondary | ICD-10-CM | POA: Diagnosis present

## 2018-05-27 LAB — LIPID PANEL
Cholesterol: 155 mg/dL (ref 0–200)
HDL: 37 mg/dL — ABNORMAL LOW (ref >40)
LDL Cholesterol: 100 mg/dL — ABNORMAL HIGH (ref 0–99)
Total CHOL/HDL Ratio: 4.2 RATIO
Triglycerides: 91 mg/dL (ref <150)
VLDL: 18 mg/dL (ref 0–40)

## 2018-05-27 LAB — CBC WITH DIFFERENTIAL/PLATELET
Abs Immature Granulocytes: 0.03 10*3/uL (ref 0.00–0.07)
Basophils Absolute: 0 10*3/uL (ref 0.0–0.1)
Basophils Relative: 1 %
Eosinophils Absolute: 0.2 10*3/uL (ref 0.0–0.5)
Eosinophils Relative: 3 %
HCT: 45.4 % (ref 39.0–52.0)
Hemoglobin: 15.1 g/dL (ref 13.0–17.0)
Immature Granulocytes: 0 %
Lymphocytes Relative: 34 %
Lymphs Abs: 2.8 10*3/uL (ref 0.7–4.0)
MCH: 31.3 pg (ref 26.0–34.0)
MCHC: 33.3 g/dL (ref 30.0–36.0)
MCV: 94.2 fL (ref 80.0–100.0)
Monocytes Absolute: 0.7 10*3/uL (ref 0.1–1.0)
Monocytes Relative: 8 %
Neutro Abs: 4.5 10*3/uL (ref 1.7–7.7)
Neutrophils Relative %: 54 %
Platelets: 221 10*3/uL (ref 150–400)
RBC: 4.82 MIL/uL (ref 4.22–5.81)
RDW: 13.8 % (ref 11.5–15.5)
WBC: 8.2 10*3/uL (ref 4.0–10.5)
nRBC: 0 % (ref 0.0–0.2)

## 2018-05-27 LAB — MAGNESIUM: Magnesium: 2.2 mg/dL (ref 1.7–2.4)

## 2018-05-27 LAB — COMPREHENSIVE METABOLIC PANEL
ALT: 60 U/L — ABNORMAL HIGH (ref 0–44)
AST: 25 U/L (ref 15–41)
Albumin: 3.4 g/dL — ABNORMAL LOW (ref 3.5–5.0)
Alkaline Phosphatase: 78 U/L (ref 38–126)
Anion gap: 10 (ref 5–15)
BUN: 17 mg/dL (ref 8–23)
CO2: 25 mmol/L (ref 22–32)
Calcium: 8.5 mg/dL — ABNORMAL LOW (ref 8.9–10.3)
Chloride: 101 mmol/L (ref 98–111)
Creatinine, Ser: 1.05 mg/dL (ref 0.61–1.24)
GFR calc Af Amer: >60 mL/min (ref >60)
GFR calc non Af Amer: >60 mL/min (ref >60)
Glucose, Bld: 110 mg/dL — ABNORMAL HIGH (ref 70–99)
Potassium: 3.8 mmol/L (ref 3.5–5.1)
Sodium: 136 mmol/L (ref 135–145)
Total Bilirubin: 1.8 mg/dL — ABNORMAL HIGH (ref 0.3–1.2)
Total Protein: 6.1 g/dL — ABNORMAL LOW (ref 6.5–8.1)

## 2018-05-27 LAB — TSH: TSH: 1.76 u[IU]/mL (ref 0.350–4.500)

## 2018-05-27 MED ORDER — RAMELTEON 8 MG PO TABS
8.0000 mg | ORAL_TABLET | Freq: Once | ORAL | Status: AC
  Administered 2018-05-27: 8 mg via ORAL
  Filled 2018-05-27: qty 1

## 2018-05-27 MED ORDER — DILTIAZEM HCL-DEXTROSE 100-5 MG/100ML-% IV SOLN (PREMIX)
5.0000 mg/h | INTRAVENOUS | Status: DC
  Filled 2018-05-27: qty 100

## 2018-05-27 MED ORDER — NICOTINE POLACRILEX 2 MG MT GUM
2.0000 mg | CHEWING_GUM | OROMUCOSAL | Status: DC | PRN
  Administered 2018-05-27 – 2018-05-28 (×2): 2 mg via ORAL
  Filled 2018-05-27 (×4): qty 1

## 2018-05-27 MED ORDER — DILTIAZEM HCL ER COATED BEADS 240 MG PO CP24
240.0000 mg | ORAL_CAPSULE | Freq: Every day | ORAL | Status: DC
  Administered 2018-05-27 – 2018-05-30 (×4): 240 mg via ORAL
  Filled 2018-05-27 (×4): qty 1

## 2018-05-27 MED ORDER — APIXABAN 5 MG PO TABS
5.0000 mg | ORAL_TABLET | Freq: Two times a day (BID) | ORAL | Status: DC
  Administered 2018-05-27 – 2018-05-30 (×7): 5 mg via ORAL
  Filled 2018-05-27 (×8): qty 1

## 2018-05-27 NOTE — Progress Notes (Signed)
PROGRESS NOTE    CHARLETON DEYOUNG  KNL:[id number] DOB: [birth year] DOA: 05/25/2018 PCP: [name], DO   Brief Narrative:  Patient is a 63 year old male, moderately obese, with past medical history significant for hypertension, hyperlipidemia and arthritis. Patient may also have undiagnosed OSA and OHS. Patient has history of tobacco use. Patient presents with 2-week history of shortness of breath. Apparently, patient was being managed for possible COPD with exacerbation by the primary care provider via telehealth. Patient was noted today to be in atrial fibrillation with heart rate of 150 bpm. Patient was advised to come to the hospital for further assessment and management. Patient was seen at [facility] ER at [facility], started on Cardizem drip, and has been transferred to the hospital for further assessment and management. CTA chest done at the ER was negative for pulmonary embolism. Troponin was within normal limits. No headache, no neck pain, no fever or chills, no chest pain, no GI symptoms and no urinary symptoms. Patient admitted to snoring at nighttime. Patient will be admitted for further assessment and management.  ED Course:Patient was started on Cardizem drip. CTA was negative for PE.   Assessment & Plan:   Principal Problem:   Atrial fibrillation with RVR (HCC) Active Problems:   Hypertension   Obesity (BMI 30-39.9)   Nicotine abuse   Atrial flutter with rapid ventricular response (HCC)   Other emphysema ([place])   Smoker   Chronic obstructive pulmonary disease ([place])  1 new onset A. fib with RVR- CHA2DS2 Vasc score 1. Patient had presented with shortness of breath x3 weeks and initially treated for COPD by PCP via tele-visit.  Chest x-ray with no acute findings.  Lungs clear on examination.  Patient shortness of breath likely secondary to new onset A. fib.  Patient on a Cardizem drip.  2D echo done with EF of 60 to 65%, left ventricular diastolic  function could not be evaluated secondary to A. fib, no evidence of wall motion abnormalities.  Right ventricle with normal systolic function.  TSH of 1.760.  Patient seen in consultation by cardiology who are recommending systemic anticoagulation for 3 weeks followed by DCCV.  Patient being transitioned to oral Cardizem from Cardizem drip per cardiology.  Patient started on Eliquis for anticoagulation.  Appreciate cardiology input and recommendations.  2.  Acute diastolic heart failure Likely secondary to problem #1.  2D echo with a EF of 60 to 79%, diastolic function could not be evaluated secondary to A. fib, no wall motion abnormalities noted.  TSH within normal limits.  Patient with clinical improvement after diuresis.  Output of 3.125 L over the past 24 hours.  Strict I's and O's.  Daily weights.  640 mg IV daily.  Patient to be started on oral Cardizem.  Patient on anticoagulation with Eliquis.  Per cardiology.  3.  Hypertension Blood pressure controlled.  Patient on Cardizem drip being transitioned to oral Cardizem.  Outpatient follow-up.  4.  Obesity  5.  COPD/tobacco abuse Tobacco cessation stressed to patient.  Currently asymptomatic.  Continue Dulera and Incruse.  Continue Flonase.   DVT prophylaxis: Eliquis Code Status: Full Family Communication: Updated patient.  No family at bedside. Disposition Plan: Home when okay with cardiology.  Hopefully in the next 24 to 48 hours.   Consultants:   Cardiology: Dr. Lovena Le 05/27/2018  Procedures:   CT angiogram chest 05/25/2018  Chest x-ray 05/25/2018  2D echo 05/26/2018  Antimicrobials:  None   Subjective: Laying in bed in no  acute distress.  States shortness of breath is improved significantly since admission.  Denies any chest pain.  Denies any abdominal pain.  Objective: Vitals:   05/27/18 0500 05/27/18 0709 05/27/18 0852 05/27/18 1023  BP: 131/81 119/76  120/73  Pulse: 75 99    Resp: 15 (!) 34    Temp: 97.7 F (36.5  C) 97.8 F (36.6 C)    TempSrc: Oral Oral    SpO2: 92% 98% 95%   Weight:      Height:        Intake/Output Summary (Last 24 hours) at 05/27/2018 1036 Last data filed at 05/27/2018 1031 Gross per 24 hour  Intake 1153.3 ml  Output 3650 ml  Net -2496.7 ml   Filed Weights   05/25/18 1437  Weight: 116.1 kg    Examination:  General exam: Appears calm and comfortable  Respiratory system: Clear to auscultation. Respiratory effort normal. Cardiovascular system: Irregularly irregular.  No JVD, murmurs, rubs, gallops or clicks.  1+ bilateral lower extremity edema.  Gastrointestinal system: Abdomen is nondistended, soft and nontender. No organomegaly or masses felt. Normal bowel sounds heard. Central nervous system: Alert and oriented. No focal neurological deficits. Extremities: Symmetric 5 x 5 power. Skin: No rashes, lesions or ulcers Psychiatry: Judgement and insight appear normal. Mood & affect appropriate.     Data Reviewed: I have personally reviewed following labs and imaging studies  CBC: Recent Labs  Lab 05/25/18 1450 05/25/18 2248 05/26/18 0054 05/27/18 0228  WBC 11.5* 11.2* 11.7* 8.2  NEUTROABS 9.6*  --   --  4.5  HGB 15.3 14.7 14.6 15.1  HCT 48.1 45.2 45.7 45.4  MCV 96.8 94.6 94.4 94.2  PLT 259 219 240 242   Basic Metabolic Panel: Recent Labs  Lab 05/25/18 1450 05/25/18 2248 05/26/18 0054 05/27/18 0228  NA 137  --  137 136  K 4.3  --  4.4 3.8  CL 108  --  103 101  CO2 21*  --  23 25  GLUCOSE 121*  --  113* 110*  BUN 21  --  17 17  CREATININE 0.94 0.89 1.04 1.05  CALCIUM 9.3  --  9.1 8.5*  MG  --  2.2  --  2.2  PHOS  --  4.8*  --   --    GFR: Estimated Creatinine Clearance: 94.3 mL/min (by C-G formula based on SCr of 1.05 mg/dL). Liver Function Tests: Recent Labs  Lab 05/25/18 1450 05/27/18 0228  AST 27 25  ALT 66* 60*  ALKPHOS 83 78  BILITOT 2.3* 1.8*  PROT 7.2 6.1*  ALBUMIN 3.9 3.4*   No results for input(s): LIPASE, AMYLASE in the last  168 hours. No results for input(s): AMMONIA in the last 168 hours. Coagulation Profile: Recent Labs  Lab 05/25/18 1450  INR 1.1   Cardiac Enzymes: Recent Labs  Lab 05/25/18 1450 05/25/18 2248 05/26/18 0054  TROPONINI <0.03 <0.03 <0.03   BNP (last 3 results) Recent Labs    06/21/17 1625  PROBNP 29.0   HbA1C: No results for input(s): HGBA1C in the last 72 hours. CBG: No results for input(s): GLUCAP in the last 168 hours. Lipid Profile: Recent Labs    05/27/18 0228  CHOL 155  HDL 37*  LDLCALC 100*  TRIG 91  CHOLHDL 4.2   Thyroid Function Tests: Recent Labs    05/27/18 0228  TSH 1.760   Anemia Panel: No results for input(s): VITAMINB12, FOLATE, FERRITIN, TIBC, IRON, RETICCTPCT in the last 72 hours. Sepsis Labs:  No results for input(s): PROCALCITON, LATICACIDVEN in the last 168 hours.  Recent Results (from the past 240 hour(s))  MRSA PCR Screening     Status: None   Collection Time: 05/25/18  9:02 PM  Result Value Ref Range Status   MRSA by PCR NEGATIVE NEGATIVE Final    Comment:        The GeneXpert MRSA Assay (FDA approved for NASAL specimens only), is one component of a comprehensive MRSA colonization surveillance program. It is not intended to diagnose MRSA infection nor to guide or monitor treatment for MRSA infections. Performed at [facility], [place] [address] [place], [place] [address]          Radiology Studies: Ct Angio Chest Pe W And/or Wo Contrast  Result Date: 05/25/2018 CLINICAL DATA:  Shortness of breath over the last 2 weeks. EXAM: CT ANGIOGRAPHY CHEST WITH CONTRAST TECHNIQUE: Multidetector CT imaging of the chest was performed using the standard protocol during bolus administration of intravenous contrast. Multiplanar CT image reconstructions and MIPs were obtained to evaluate the vascular anatomy. CONTRAST:  63mL OMNIPAQUE IOHEXOL 350 MG/ML SOLN COMPARISON:  Radiography same day.  CT 02/26/2014. FINDINGS: Cardiovascular:  Heart size is normal. No visible coronary artery calcification. The aorta is of normal caliber without gross atherosclerotic change. Pulmonary arterial opacification is excellent. There are no pulmonary emboli. Mediastinum/Nodes: There is newly seen adenopathy in the right hilum and subcarinal region. The single largest node is a subcarinal node just to the right of midline measuring 18 mm in diameter. Lungs/Pleura: There is interstitial prominence throughout the lungs. No evidence of consolidation, lobar collapse or pleural effusion. This could represent mild interstitial edema or interstitial pneumonitis. No evidence of focal mass lesion. Few small scattered emphysematous blebs. Upper Abdomen: Chronic benign low-density in the right lobe of the liver, unchanged since the previous study. Chronic right adrenal adenoma. No acute finding. Musculoskeletal: Ordinary mild degenerative changes affect the spine. Review of the MIP images confirms the above findings. IMPRESSION: No pulmonary emboli. Normal heart size. No significant atherosclerosis seen. Development of abnormal interstitial density throughout the lungs. Pattern could represent mild edema or interstitial pneumonitis. No pleural effusion. No dense consolidation or collapse. Newly seen nodal prominence of the right hilum and subcarinal region, not present in 2016. Largest subcarinal node measures 18 mm. No sign of a lung mass. Therefore, these nodes are felt to be reactive. Electronically Signed   By: Nelson Chimes M.D.   On: 05/25/2018 16:34   Dg Chest Portable 1 View  Result Date: 05/25/2018 CLINICAL DATA:  Shortness of breath and tachycardia EXAM: PORTABLE CHEST 1 VIEW COMPARISON:  Jun 21, 2017 FINDINGS: There is no edema or consolidation. Heart is upper normal in size with pulmonary vascularity normal. No adenopathy. No bone lesions. IMPRESSION: No edema or consolidation. Electronically Signed   By: Lowella Grip III M.D.   On: 05/25/2018 15:19         Scheduled Meds: . apixaban  5 mg Oral BID  . aspirin EC  81 mg Oral Daily  . diltiazem  240 mg Oral Daily  . fluticasone  2 spray Each Nare Daily  . furosemide  40 mg Intravenous Daily  . mometasone-formoterol  2 puff Inhalation BID  . umeclidinium bromide  1 puff Inhalation Daily   Continuous Infusions: . diltiazem (CARDIZEM) infusion       LOS: 2 days    Time spent: 40 minutes    Irine Seal, MD [facility]  If 7PM-7AM,  please contact night-coverage [email or web address] 05/27/2018, 10:36 AM

## 2018-05-27 NOTE — Discharge Instructions (Addendum)
° °  PLEASE SIGN UP TO YOUR MY CHART ACCOUNT WHEN YOU GET HOME (if you aren't already).  IN THE CURRENT ENVIRONMENT WITH COVID-19, IN EFFORT TO REDUCE YOUR EXPOSURE WE WILL BE CONDUCTING MANY PATIENT VISITS BY EITHER VIRTUAL/VIDEO VISITS or TELEPHONE VISITS.  BEING SIGNED UP IN YOUR MY CHART ACCOUNT  WILL HELP FACILITATE THESE VISITS AND OUR COMMUNICATION WITH YOU    Information on my medicine - ELIQUIS (apixaban)  Why was Eliquis prescribed for you? Eliquis was prescribed for you to reduce the risk of a blood clot forming that can cause a stroke if you have a medical condition called atrial fibrillation (a type of irregular heartbeat).  What do You need to know about Eliquis ? Take your Eliquis TWICE DAILY - one tablet in the morning and one tablet in the evening with or without food. If you have difficulty swallowing the tablet whole please discuss with your pharmacist how to take the medication safely.  Take Eliquis exactly as prescribed by your doctor and DO NOT stop taking Eliquis without talking to the doctor who prescribed the medication.  Stopping may increase your risk of developing a stroke.  Refill your prescription before you run out.  After discharge, you should have regular check-up appointments with your healthcare provider that is prescribing your Eliquis.  In the future your dose may need to be changed if your kidney function or weight changes by a significant amount or as you get older.  What do you do if you miss a dose? If you miss a dose, take it as soon as you remember on the same day and resume taking twice daily.  Do not take more than one dose of ELIQUIS at the same time to make up a missed dose.  Important Safety Information A possible side effect of Eliquis is bleeding. You should call your healthcare provider right away if you experience any of the following: ? Bleeding from an injury or your nose that does not stop. ? Unusual colored urine (red or dark  brown) or unusual colored stools (red or black). ? Unusual bruising for unknown reasons. ? A serious fall or if you hit your head (even if there is no bleeding).  Some medicines may interact with Eliquis and might increase your risk of bleeding or clotting while on Eliquis. To help avoid this, consult your healthcare provider or pharmacist prior to using any new prescription or non-prescription medications, including herbals, vitamins, non-steroidal anti-inflammatory drugs (NSAIDs) and supplements.  This website has more information on Eliquis (apixaban): http://[email or web address].com/eliquis/home

## 2018-05-27 NOTE — Consult Note (Signed)
Cardiology Consultation:   Patient ID: Mitchell Parrish MRN: [id number]; DOB: 21-Oct-1955  Admit date: 05/25/2018 Date of Consult: 05/27/2018  Primary Care Provider: [name], DO Primary Cardiologist: No primary care provider on file. new Primary Electrophysiologist:  None new   Patient Profile:   Mitchell Parrish is a 63 y.o. male with a hx of HTN who is being seen today for the evaluation of atrial fib and sob at the request of Mitchell Parrish.  History of Present Illness:   Mitchell Parrish has been healthy but notes progressive sob for the past few months which worsened the past few weeks. 5 days ago he became more sob and was thought to have bronchitis and was treated with prednisone and anti-biotics without improvement. He was seen in the office on Friday and admitted with atrial fib and worsening sob. The patient has been treated with IV lasix and IV cardizem and feels much better. He denies fever or chills or cough or other symptoms associated with the Covid pandemic. He has not been treated with systemic anti-coagulation. His CHADSVASC score is 1. His father sustained a massive stroke in his 61s.  Past Medical History:  Diagnosis Date  . Arthritis    left hip  . Hyperlipidemia   . Hypertension     Past Surgical History:  Procedure Laterality Date  . BACK SURGERY    . HERNIA REPAIR    . SPINE SURGERY     cervical fusion c6, c7 (1994), and c3,4,5,6 (2004)     Home Medications:  Prior to Admission medications   Medication Sig Start Date End Date Taking? Authorizing Provider  amLODipine-valsartan (EXFORGE) 5-320 MG tablet Take 1 tablet by mouth daily. 05/25/18  Yes Mitchell Pal, DO  fluticasone (FLONASE) 50 MCG/ACT nasal spray Place 2 sprays into both nostrils daily. Patient taking differently: Place 2 sprays into both nostrils as needed for allergies or rhinitis.  05/09/18  Yes Mitchell Parrish, Mitchell Miller, PA-C  levocetirizine (XYZAL) 5 MG tablet Take 1 tablet (5 mg  total) by mouth every evening. 05/09/18  Yes Mitchell Parrish, Mitchell Miller, PA-C  OVER THE COUNTER MEDICATION Take 0.05 drops by mouth as needed. CBD oil 240 mg on the bottle   Yes [provider]  PROAIR HFA 108 (90 Base) MCG/ACT inhaler INHALE 2 PUFFS INTO THE LUNGS EVERY 4 HOURS AS NEEDED FOR WHEEZING ORSHORTNESS OF BREATH Patient taking differently: Inhale 2 puffs into the lungs as needed.  04/05/18  Yes Mitchell Parrish, Mitchell Oyster, DO  SYMBICORT 80-4.5 MCG/ACT inhaler INHALE 2 PUFFS INTO THE LUNGS TWICE DAILY Patient taking differently: Inhale 2 puffs into the lungs 2 (two) times daily.  04/05/18  Yes Mitchell Pal, DO  umeclidinium bromide (INCRUSE ELLIPTA) 62.5 MCG/INH AEPB Inhale 1 puff into the lungs daily. 05/21/18  Yes Mitchell Pal, DO  mirabegron ER (MYRBETRIQ) 25 MG TB24 tablet Take 1 tablet (25 mg total) by mouth daily. Patient not taking: Reported on 05/26/2018 05/05/16   Mitchell Pal, DO  omeprazole (PRILOSEC) 20 MG capsule TAKE 1 CAPSULE BY MOUTH TWICE DAILY BEFORE A MEAL Patient not taking: No sig reported 03/21/17   Mitchell Pal, DO  oxybutynin (DITROPAN-XL) 5 MG 24 hr tablet Take 1 tablet (5 mg total) by mouth at bedtime. Patient not taking: Reported on 05/26/2018 05/19/16   Mitchell Pal, DO  tamsulosin (FLOMAX) 0.4 MG CAPS capsule Take 1 capsule (0.4 mg total) by mouth daily after supper. Patient not taking: Reported on 05/26/2018 03/24/16  Mitchell Pal, DO    Inpatient Medications: Scheduled Meds: . aspirin EC  81 mg Oral Daily  . enoxaparin (LOVENOX) injection  40 mg Subcutaneous Q24H  . fluticasone  2 spray Each Nare Daily  . furosemide  40 mg Intravenous Daily  . mometasone-formoterol  2 puff Inhalation BID  . umeclidinium bromide  1 puff Inhalation Daily   Continuous Infusions: . diltiazem (CARDIZEM) infusion 10 mg/hr (05/27/18 0829)   PRN Meds: albuterol, nicotine polacrilex  Allergies:    Allergies  Allergen  Reactions  . Lodine [Etodolac] Hives  . Percocet [Oxycodone-Acetaminophen] Swelling    Social History:   Social History   Socioeconomic History  . Marital status: Married    Spouse name: Not on file  . Number of children: 4  . Years of education: Not on file  . Highest education level: Not on file  Occupational History  . Occupation: TRUCK DRIVER    Employer: [employer]  Social Needs  . Financial resource strain: Not on file  . Food insecurity:    Worry: Not on file    Inability: Not on file  . Transportation needs:    Medical: Not on file    Non-medical: Not on file  Tobacco Use  . Smoking status: Former Smoker    Packs/day: 1.00    Years: 45.00    Pack years: 45.00    Types: E-cigarettes    Last attempt to quit: 03/15/2016    Years since quitting: 2.2  . Smokeless tobacco: Never Used  Substance and Sexual Activity  . Alcohol use: Yes    Comment: 2-3 drinks/ day  . Drug use: No  . Sexual activity: Yes  Lifestyle  . Physical activity:    Days per week: Not on file    Minutes per session: Not on file  . Stress: Not on file  Relationships  . Social connections:    Talks on phone: Not on file    Gets together: Not on file    Attends religious service: Not on file    Active member of club or organization: Not on file    Attends meetings of clubs or organizations: Not on file    Relationship status: Not on file  . Intimate partner violence:    Fear of current or ex partner: Not on file    Emotionally abused: Not on file    Physically abused: Not on file    Forced sexual activity: Not on file  Other Topics Concern  . Not on file  Social History Narrative  . Not on file    Family History:    Family History  Problem Relation Age of Onset  . Diabetes Mother   . Lung cancer Mother 69  . Diabetes Father   . Heart disease Father   . Colon cancer Father 41       rectal cancer     ROS:  Please see the history of present illness.   All other ROS reviewed  and negative.     Physical Exam/Data:   Vitals:   05/26/18 1955 05/26/18 2300 05/27/18 0500 05/27/18 0709  BP: (!) 128/92 107/76 131/81 119/76  Pulse: (!) 55 82 75 99  Resp: 20 11 15  (!) 34  Temp: 98 F (36.7 C) 98.2 F (36.8 C) 97.7 F (36.5 C) 97.8 F (36.6 C)  TempSrc: Axillary Axillary Oral Oral  SpO2: 96% 92% 92% 98%  Weight:      Height:  Intake/Output Summary (Last 24 hours) at 05/27/2018 0837 Last data filed at 05/27/2018 0756 Gross per 24 hour  Intake 1533.3 ml  Output 3425 ml  Net -1891.7 ml   Last 3 Weights 05/25/2018 05/25/2018 06/21/2017  Weight (lbs) 256 lb 256 lb 242 lb 3.2 oz  Weight (kg) 116.121 kg 116.121 kg 109.861 kg     Body mass index is 36.73 kg/m.  General:  Well nourished, well developed, in no acute distress HEENT: normal Lymph: no adenopathy Neck: 7 cm JVD Endocrine:  No thryomegaly Vascular: No carotid bruits; FA pulses 2+ bilaterally without bruits  Cardiac:  normal S1, S2; IRIRR; no murmur  Lungs:  clear to auscultation bilaterally, no wheezing, rhonchi. Scattered basilar rales  Abd: soft, nontender, no hepatomegaly  Ext: no edema Musculoskeletal:  No deformities, BUE and BLE strength normal and equal Skin: warm and dry  Neuro:  CNs 2-12 intact, no focal abnormalities noted Psych:  Normal affect   EKG:  The EKG was personally reviewed and demonstrates:  Atrial fib with a controlled VR Telemetry:  Telemetry was personally reviewed and demonstrates:  Atrial fib with a RVR/CVR  Relevant CV Studies: CT neg for PE  Laboratory Data:  Chemistry Recent Labs  Lab 05/25/18 1450 05/25/18 2248 05/26/18 0054 05/27/18 0228  NA 137  --  137 136  K 4.3  --  4.4 3.8  CL 108  --  103 101  CO2 21*  --  23 25  GLUCOSE 121*  --  113* 110*  BUN 21  --  17 17  CREATININE 0.94 0.89 1.04 1.05  CALCIUM 9.3  --  9.1 8.5*  GFRNONAA >60 >60 >60 >60  GFRAA >60 >60 >60 >60  ANIONGAP 8  --  11 10    Recent Labs  Lab 05/25/18 1450 05/27/18  0228  PROT 7.2 6.1*  ALBUMIN 3.9 3.4*  AST 27 25  ALT 66* 60*  ALKPHOS 83 78  BILITOT 2.3* 1.8*   Hematology Recent Labs  Lab 05/25/18 2248 05/26/18 0054 05/27/18 0228  WBC 11.2* 11.7* 8.2  RBC 4.78 4.84 4.82  HGB 14.7 14.6 15.1  HCT 45.2 45.7 45.4  MCV 94.6 94.4 94.2  MCH 30.8 30.2 31.3  MCHC 32.5 31.9 33.3  RDW 14.0 14.0 13.8  PLT 219 240 221   Cardiac Enzymes Recent Labs  Lab 05/25/18 1450 05/25/18 2248 05/26/18 0054  TROPONINI <0.03 <0.03 <0.03   No results for input(s): TROPIPOC in the last 168 hours.  BNP Recent Labs  Lab 05/25/18 2248  BNP 198.8*    DDimer  Recent Labs  Lab 05/25/18 1450  DDIMER 0.64*    Radiology/Studies:  Ct Angio Chest Pe W And/or Wo Contrast  Result Date: 05/25/2018 CLINICAL DATA:  Shortness of breath over the last 2 weeks. EXAM: CT ANGIOGRAPHY CHEST WITH CONTRAST TECHNIQUE: Multidetector CT imaging of the chest was performed using the standard protocol during bolus administration of intravenous contrast. Multiplanar CT image reconstructions and MIPs were obtained to evaluate the vascular anatomy. CONTRAST:  38mL OMNIPAQUE IOHEXOL 350 MG/ML SOLN COMPARISON:  Radiography same day.  CT 02/26/2014. FINDINGS: Cardiovascular: Heart size is normal. No visible coronary artery calcification. The aorta is of normal caliber without gross atherosclerotic change. Pulmonary arterial opacification is excellent. There are no pulmonary emboli. Mediastinum/Nodes: There is newly seen adenopathy in the right hilum and subcarinal region. The single largest node is a subcarinal node just to the right of midline measuring 18 mm in diameter. Lungs/Pleura: There is  interstitial prominence throughout the lungs. No evidence of consolidation, lobar collapse or pleural effusion. This could represent mild interstitial edema or interstitial pneumonitis. No evidence of focal mass lesion. Few small scattered emphysematous blebs. Upper Abdomen: Chronic benign low-density  in the right lobe of the liver, unchanged since the previous study. Chronic right adrenal adenoma. No acute finding. Musculoskeletal: Ordinary mild degenerative changes affect the spine. Review of the MIP images confirms the above findings. IMPRESSION: No pulmonary emboli. Normal heart size. No significant atherosclerosis seen. Development of abnormal interstitial density throughout the lungs. Pattern could represent mild edema or interstitial pneumonitis. No pleural effusion. No dense consolidation or collapse. Newly seen nodal prominence of the right hilum and subcarinal region, not present in 2016. Largest subcarinal node measures 18 mm. No sign of a lung mass. Therefore, these nodes are felt to be reactive. Electronically Signed   By: Nelson Chimes M.D.   On: 05/25/2018 16:34   Dg Chest Portable 1 View  Result Date: 05/25/2018 CLINICAL DATA:  Shortness of breath and tachycardia EXAM: PORTABLE CHEST 1 VIEW COMPARISON:  Jun 21, 2017 FINDINGS: There is no edema or consolidation. Heart is upper normal in size with pulmonary vascularity normal. No adenopathy. No bone lesions. IMPRESSION: No edema or consolidation. Electronically Signed   By: Lowella Grip III M.D.   On: 05/25/2018 15:19    Assessment and Plan:   1. Atrial fib with a RVR - this is a new diagnosis. He will need systemic anti-coagulation for 3 weeks followed by DCCV. In the interim, we will need to transition off of IV cardizem to oral cardizem.  2. Acute diastolic heart failure - his dyspnea is much improved after lasix. He will be prescribed lasix daily as an outpatient at least while he is in atrial fib. We will avoid 2D echo today due to national covid pandemic in an attempt to minimize exposure. An outpatient echo can be performed after he is back in rhythm. 3. HTN - his blood pressure is controlled. He has a diagnosis of atrial fib but was non-compliant and had not taken any medication for months.  4. Obesity - he needs to lose  weight. I suspect he has sleep disordered breathing and will likely need evaluation for sleep apnea as an outpatient.      For questions or updates, please contact [place] Please consult [email or web address] for contact info under   Signed, Cristopher Peru, MD  05/27/2018 8:37 AM

## 2018-05-28 LAB — BASIC METABOLIC PANEL
Anion gap: 7 (ref 5–15)
BUN: 16 mg/dL (ref 8–23)
CO2: 28 mmol/L (ref 22–32)
Calcium: 8.5 mg/dL — ABNORMAL LOW (ref 8.9–10.3)
Chloride: 100 mmol/L (ref 98–111)
Creatinine, Ser: 0.97 mg/dL (ref 0.61–1.24)
GFR calc Af Amer: >60 mL/min (ref >60)
GFR calc non Af Amer: >60 mL/min (ref >60)
Glucose, Bld: 110 mg/dL — ABNORMAL HIGH (ref 70–99)
Potassium: 3.8 mmol/L (ref 3.5–5.1)
Sodium: 135 mmol/L (ref 135–145)

## 2018-05-28 LAB — CBC
HCT: 46.8 % (ref 39.0–52.0)
Hemoglobin: 15.8 g/dL (ref 13.0–17.0)
MCH: 31.6 pg (ref 26.0–34.0)
MCHC: 33.8 g/dL (ref 30.0–36.0)
MCV: 93.6 fL (ref 80.0–100.0)
Platelets: 218 10*3/uL (ref 150–400)
RBC: 5 MIL/uL (ref 4.22–5.81)
RDW: 13.4 % (ref 11.5–15.5)
WBC: 8.7 10*3/uL (ref 4.0–10.5)
nRBC: 0 % (ref 0.0–0.2)

## 2018-05-28 LAB — MAGNESIUM: Magnesium: 2.3 mg/dL (ref 1.7–2.4)

## 2018-05-28 MED ORDER — DILTIAZEM HCL 25 MG/5ML IV SOLN: 10.0000 mg | Freq: Once | INTRAVENOUS | Status: DC

## 2018-05-28 MED ORDER — METOPROLOL TARTRATE 5 MG/5ML IV SOLN
5.0000 mg | Freq: Once | INTRAVENOUS | Status: AC
  Administered 2018-05-28: 5 mg via INTRAVENOUS
  Filled 2018-05-28: qty 5

## 2018-05-28 MED ORDER — POTASSIUM CHLORIDE CRYS ER 20 MEQ PO TBCR
40.0000 meq | EXTENDED_RELEASE_TABLET | Freq: Once | ORAL | Status: AC
  Administered 2018-05-28: 08:00:00 40 meq via ORAL
  Filled 2018-05-28: qty 2

## 2018-05-28 MED ORDER — DILTIAZEM LOAD VIA INFUSION
10.0000 mg | Freq: Once | INTRAVENOUS | Status: DC
  Filled 2018-05-28: qty 10

## 2018-05-28 MED ORDER — DIGOXIN 0.25 MG/ML IJ SOLN
0.2500 mg | Freq: Once | INTRAMUSCULAR | Status: AC
  Administered 2018-05-28: 0.25 mg via INTRAVENOUS
  Filled 2018-05-28: qty 2

## 2018-05-28 NOTE — TOC Benefit Eligibility Note (Signed)
Transition of Care [facility]) Benefit Eligibility Note    Patient Details  Name: DARRICK GREENLAW MRN: [id number] Date of Birth: April 15, 1955   Medication/Dose: Arne Cleveland  10 MG (NON-FORMULARY)  Covered?: No(ELIQUIS  10 MG NON-FORMULARY)     Prescription Coverage Preferred Pharmacy: YES([facility], [place] , [facility] AND [facility])  Spoke with Person/Company/Phone Number:: JEANNE([employer] # [phone])           Additional Notes: ELIQUIS 5 MG  AND ELIQUIS 2.5 MG  BID  COVER-YES  CO-PAY- $ 40.00  FOR EACH PRESCRIPTION TIER 3 DRUG PRIOR APPROVAL -NO    Memory Argue Phone Number: 05/28/2018, 3:28 PM

## 2018-05-28 NOTE — Progress Notes (Addendum)
PROGRESS NOTE    Mitchell Parrish  TTS:[id number] DOB: September 30, 1955 DOA: 05/25/2018 PCP: [name], DO   Brief Narrative:  Patient is a 63 year old male, moderately obese, with past medical history significant for hypertension, hyperlipidemia and arthritis. Patient may also have undiagnosed OSA and OHS. Patient has history of tobacco use. Patient presents with 2-week history of shortness of breath. Apparently, patient was being managed for possible COPD with exacerbation by the primary care provider via telehealth. Patient was noted today to be in atrial fibrillation with heart rate of 150 bpm. Patient was advised to come to the hospital for further assessment and management. Patient was seen at [facility] ER [facility], started on Cardizem drip, and has been transferred to the hospital for further assessment and management. CTA chest done at the ER was negative for pulmonary embolism. Troponin was within normal limits. No headache, no neck pain, no fever or chills, no chest pain, no GI symptoms and no urinary symptoms. Patient admitted to snoring at nighttime. Patient will be admitted for further assessment and management.  ED Course:Patient was started on Cardizem drip. CTA was negative for PE.   Assessment & Plan:   Principal Problem:   Atrial fibrillation with RVR (HCC) Active Problems:   Hypertension   Obesity (BMI 30-39.9)   Nicotine abuse   Atrial flutter with rapid ventricular response (HCC)   Other emphysema ([place])   Smoker   Chronic obstructive pulmonary disease ([place])  1 new onset A. fib with RVR- CHA2DS2 Vasc score 1. Patient had presented with shortness of breath x3 weeks and initially treated for COPD by PCP via tele-visit.  Chest x-ray with no acute findings.  Lungs clear on examination.  Patient shortness of breath likely secondary to new onset A. fib.  Patient on a Cardizem drip.  2D echo done with EF of 60 to 65%, left ventricular diastolic  function could not be evaluated secondary to A. fib, no evidence of wall motion abnormalities.  Right ventricle with normal systolic function.  TSH of 1.760.  Patient seen in consultation by cardiology who are recommending systemic anticoagulation for 3 weeks followed by DCCV.  Patient was transitioned to Cardizem 240 mg daily per cardiology on 05/27/2018.  Heart rate this morning sustained in the 140s to the 150s despite oral Cardizem dose.  We will give Lopressor 5 mg IV x1 now.  Cardiology following.  He may need outpatient evaluation for OSA.  2.  Acute diastolic heart failure Likely secondary to problem #1.  2D echo with a EF of 60 to 09%, diastolic function could not be evaluated secondary to A. fib, no wall motion abnormalities noted.  TSH within normal limits.  Patient with clinical improvement after diuresis.  Urine output of 1.5 L over the past 24 hours.  Strict I's and O's.  Daily weights.  Continue Lasix 40 mg IV daily.  Patient started on oral Cardizem for better rate control per cardiology.  Patient started on anticoagulation with Eliquis.  Will discontinue aspirin.  Per cardiology.  3.  Hypertension Blood pressure seems well controlled.  Patient on oral Cardizem.  Outpatient follow-up.   4.  Obesity  5.  COPD/tobacco abuse Tobacco cessation stressed to patient.  Currently asymptomatic.  Continue Dulera and Incruse.  Continue Flonase.   DVT prophylaxis: Eliquis Code Status: Full Family Communication: Updated patient.  No family at bedside. Disposition Plan: Home when heart rate better controlled and okay with cardiology.     Consultants:  Cardiology: Dr. Lovena Le 05/27/2018  Procedures:   CT angiogram chest 05/25/2018  Chest x-ray 05/25/2018  2D echo 05/26/2018  Antimicrobials:  None   Subjective: Patient sleeping however easily arousable.  Patient noted to be in A. fib with heart rate sustaining in the 140s to the 150s.  Patient already received oral Cardizem  approximately 30 minutes with heart rate still in the 140s to 150s.  Patient denies any chest pain.  Denies any shortness of breath.  Denies any abdominal pain.  Denies any bleeding.   Objective: Vitals:   05/28/18 0300 05/28/18 0723 05/28/18 0800 05/28/18 0825  BP: 97/66 110/72    Pulse: (!) 103  80   Resp: 17  20   Temp: 97.9 F (36.6 C) 97.6 F (36.4 C)    TempSrc: Oral Oral    SpO2: 90% 94%  93%  Weight:      Height:        Intake/Output Summary (Last 24 hours) at 05/28/2018 0905 Last data filed at 05/28/2018 0723 Gross per 24 hour  Intake 1040.27 ml  Output 3725 ml  Net -2684.73 ml   Filed Weights   05/25/18 1437  Weight: 116.1 kg    Examination:  General exam: Appears calm and comfortable  Respiratory system: Lungs clear to auscultation bilaterally.  No wheezes, no crackles, no rhonchi. Respiratory effort normal. Cardiovascular system: Irregularly irregular.  No JVD, no murmurs, no rubs, no gallops.  No lower extremity edema.   Gastrointestinal system: Abdomen is soft, nontender, nondistended, positive bowel sounds.  No rebound.  No guarding.  Central nervous system: Alert and oriented. No focal neurological deficits. Extremities: Symmetric 5 x 5 power. Skin: No rashes, lesions or ulcers Psychiatry: Judgement and insight appear normal. Mood & affect appropriate.     Data Reviewed: I have personally reviewed following labs and imaging studies  CBC: Recent Labs  Lab 05/25/18 1450 05/25/18 2248 05/26/18 0054 05/27/18 0228 05/28/18 0249  WBC 11.5* 11.2* 11.7* 8.2 8.7  NEUTROABS 9.6*  --   --  4.5  --   HGB 15.3 14.7 14.6 15.1 15.8  HCT 48.1 45.2 45.7 45.4 46.8  MCV 96.8 94.6 94.4 94.2 93.6  PLT 259 219 240 221 465   Basic Metabolic Panel: Recent Labs  Lab 05/25/18 1450 05/25/18 2248 05/26/18 0054 05/27/18 0228 05/28/18 0249  NA 137  --  137 136 135  K 4.3  --  4.4 3.8 3.8  CL 108  --  103 101 100  CO2 21*  --  23 25 28   GLUCOSE 121*  --  113*  110* 110*  BUN 21  --  17 17 16   CREATININE 0.94 0.89 1.04 1.05 0.97  CALCIUM 9.3  --  9.1 8.5* 8.5*  MG  --  2.2  --  2.2 2.3  PHOS  --  4.8*  --   --   --    GFR: Estimated Creatinine Clearance: 102 mL/min (by C-G formula based on SCr of 0.97 mg/dL). Liver Function Tests: Recent Labs  Lab 05/25/18 1450 05/27/18 0228  AST 27 25  ALT 66* 60*  ALKPHOS 83 78  BILITOT 2.3* 1.8*  PROT 7.2 6.1*  ALBUMIN 3.9 3.4*   No results for input(s): LIPASE, AMYLASE in the last 168 hours. No results for input(s): AMMONIA in the last 168 hours. Coagulation Profile: Recent Labs  Lab 05/25/18 1450  INR 1.1   Cardiac Enzymes: Recent Labs  Lab 05/25/18 1450 05/25/18 2248 05/26/18 0054  TROPONINI <0.03 <  0.03 <0.03   BNP (last 3 results) Recent Labs    06/21/17 1625  PROBNP 29.0   HbA1C: No results for input(s): HGBA1C in the last 72 hours. CBG: No results for input(s): GLUCAP in the last 168 hours. Lipid Profile: Recent Labs    05/27/18 0228  CHOL 155  HDL 37*  LDLCALC 100*  TRIG 91  CHOLHDL 4.2   Thyroid Function Tests: Recent Labs    05/27/18 0228  TSH 1.760   Anemia Panel: No results for input(s): VITAMINB12, FOLATE, FERRITIN, TIBC, IRON, RETICCTPCT in the last 72 hours. Sepsis Labs: No results for input(s): PROCALCITON, LATICACIDVEN in the last 168 hours.  Recent Results (from the past 240 hour(s))  MRSA PCR Screening     Status: None   Collection Time: 05/25/18  9:02 PM  Result Value Ref Range Status   MRSA by PCR NEGATIVE NEGATIVE Final    Comment:        The GeneXpert MRSA Assay (FDA approved for NASAL specimens only), is one component of a comprehensive MRSA colonization surveillance program. It is not intended to diagnose MRSA infection nor to guide or monitor treatment for MRSA infections. Performed at [facility], [place] [address], [place], [place] [address]          Radiology Studies: No results found.      Scheduled Meds: .  apixaban  5 mg Oral BID  . aspirin EC  81 mg Oral Daily  . diltiazem  240 mg Oral Daily  . fluticasone  2 spray Each Nare Daily  . mometasone-formoterol  2 puff Inhalation BID  . umeclidinium bromide  1 puff Inhalation Daily   Continuous Infusions: . diltiazem (CARDIZEM) infusion Stopped (05/27/18 1414)     LOS: 3 days    Time spent: 40 minutes    Irine Seal, MD [facility]  If 7PM-7AM, please contact night-coverage [email or web address] 05/28/2018, 9:05 AM

## 2018-05-28 NOTE — Progress Notes (Signed)
Progress Note  Patient Name: Mitchell Parrish Date of Encounter: 05/28/2018  Primary Cardiologist: Cristopher Peru, MD   Subjective   " I feel better, I walked two laps around".  Inpatient Medications    Scheduled Meds: . apixaban  5 mg Oral BID  . aspirin EC  81 mg Oral Daily  . diltiazem  240 mg Oral Daily  . fluticasone  2 spray Each Nare Daily  . metoprolol tartrate  5 mg Intravenous Once  . mometasone-formoterol  2 puff Inhalation BID  . umeclidinium bromide  1 puff Inhalation Daily   Continuous Infusions: . diltiazem (CARDIZEM) infusion Stopped (05/27/18 1414)   PRN Meds: albuterol, nicotine polacrilex   Vital Signs    Vitals:   05/28/18 0300 05/28/18 0723 05/28/18 0800 05/28/18 0825  BP: 97/66 110/72    Pulse: (!) 103  80   Resp: 17  20   Temp: 97.9 F (36.6 C) 97.6 F (36.4 C)    TempSrc: Oral Oral    SpO2: 90% 94%  93%  Weight:      Height:        Intake/Output Summary (Last 24 hours) at 05/28/2018 0906 Last data filed at 05/28/2018 0723 Gross per 24 hour  Intake 1040.27 ml  Output 3725 ml  Net -2684.73 ml   Last 3 Weights 05/25/2018 05/25/2018 06/21/2017  Weight (lbs) 256 lb 256 lb 242 lb 3.2 oz  Weight (kg) 116.121 kg 116.121 kg 109.861 kg      Telemetry    AFib faster again this AM, 120's-130's - Personally Reviewed  ECG    No new EKGs - Personally Reviewed  Physical Exam     GEN: No acute distress.   Neck: No JVD Cardiac: irreg-irreg, tachycardic, no murmurs, rubs, or gallops.  Respiratory: no increased work of breathing . GI: no distention   MS:  No edema; No obvious deformities. Neuro:  Nonfocal  Psych: Normal affect   Labs    Chemistry Recent Labs  Lab 05/25/18 1450  05/26/18 0054 05/27/18 0228 05/28/18 0249  NA 137  --  137 136 135  K 4.3  --  4.4 3.8 3.8  CL 108  --  103 101 100  CO2 21*  --  23 25 28   GLUCOSE 121*  --  113* 110* 110*  BUN 21  --  17 17 16   CREATININE 0.94   < > 1.04 1.05 0.97  CALCIUM 9.3  --   9.1 8.5* 8.5*  PROT 7.2  --   --  6.1*  --   ALBUMIN 3.9  --   --  3.4*  --   AST 27  --   --  25  --   ALT 66*  --   --  60*  --   ALKPHOS 83  --   --  78  --   BILITOT 2.3*  --   --  1.8*  --   GFRNONAA >60   < > >60 >60 >60  GFRAA >60   < > >60 >60 >60  ANIONGAP 8  --  11 10 7    < > = values in this interval not displayed.     Hematology Recent Labs  Lab 05/26/18 0054 05/27/18 0228 05/28/18 0249  WBC 11.7* 8.2 8.7  RBC 4.84 4.82 5.00  HGB 14.6 15.1 15.8  HCT 45.7 45.4 46.8  MCV 94.4 94.2 93.6  MCH 30.2 31.3 31.6  MCHC 31.9 33.3 33.8  RDW 14.0 13.8 13.4  PLT 240 221 218    Cardiac Enzymes Recent Labs  Lab 05/25/18 1450 05/25/18 2248 05/26/18 0054  TROPONINI <0.03 <0.03 <0.03   No results for input(s): TROPIPOC in the last 168 hours.   BNP Recent Labs  Lab 05/25/18 2248  BNP 198.8*     DDimer  Recent Labs  Lab 05/25/18 1450  DDIMER 0.64*     Radiology    No results found.  Cardiac Studies    05/26/2018: TTE IMPRESSIONS  1. The left ventricle has normal systolic function with an ejection fraction of 60-65%. The cavity size was normal. Left ventricular diastolic function could not be evaluated secondary to atrial fibrillation. No evidence of left ventricular regional  wall motion abnormalities.  2. The right ventricle has normal systolic function. The cavity was normal. There is no increase in right ventricular wall thickness. Right ventricular systolic pressure Normal with an estimated pressure of 31.0 mmHg.  3. Left atrial size was mild-moderately dilated.  4. Right atrial size was mildly dilated.  5. The aortic valve is tricuspid. Mild calcification of the aortic valve.  6. The inferior vena cava was normal in size with <50% respiratory variability.  FINDINGS  Left Ventricle: The left ventricle has normal systolic function, with an ejection fraction of 60-65%. The cavity size was normal. There is no increase in left ventricular wall thickness.  Left ventricular diastolic function could not be evaluated  secondary to atrial fibrillation. No evidence of left ventricular regional wall motion abnormalities.. Right Ventricle: The right ventricle has normal systolic function. The cavity was normal. There is no increase in right ventricular wall thickness. Right ventricular systolic pressure Normal with an estimated pressure of 31.0 mmHg. Left Atrium: Left atrial size was mild-moderately dilated. Right Atrium: Right atrial size was mildly dilated. Right atrial pressure is estimated at 8 mmHg. Interatrial Septum: No atrial level shunt detected by color flow Doppler. Pericardium: There is no evidence of pericardial effusion. Mitral Valve: The mitral valve is normal in structure. Mitral valve regurgitation is trivial by color flow Doppler. Tricuspid Valve: The tricuspid valve is normal in structure. Tricuspid valve regurgitation is mild by color flow Doppler. Aortic Valve: The aortic valve is tricuspid Mild calcification of the aortic valve. Aortic valve regurgitation was not visualized by color flow Doppler. Pulmonic Valve: The pulmonic valve was normal in structure. Pulmonic valve regurgitation is not visualized by color flow Doppler. Venous: The inferior vena cava is normal in size with less than 50% respiratory variability.     Patient Profile     63 y.o. male HTN, HLD, arthritis, obesity, suspected undiagnosed OSA/OHS, of late w/complaints of SOB treated out patient for suspect COPD, presented to [facility] with new rapid AFib and transferred to [facility] for management.  He has neg CT chest for PE  Assessment & Plan     1. New AFib     uncontrolled VR, back 120-130's this AM     CHA2DS2Vasc is one, will need 3 weeks of uninterrupted a/c prior to consideration of DCCV     On Eliquis, appropriately dosed  On Diltiazem 240mg  QD, just got this AM dose, follow, he may need up-titration, perhaps add on BB LVEF 60-65% LA 80mm, no significant MR    2. Acute CHF (diastolic)     Poss 2/2 AF w/RVR     Fluid negative, cumulatively -6570ml     Renal function stable     well compensated despite increased VR    3. Suspected OSA/OHS  Needs out patient evaluation, if diagnosed and treated will help with his AF management as well    For questions or updates, please contact [place] Please consult [email or web address] for contact info under   Signed, Baldwin Jamaica, PA-C  05/28/2018, 9:06 AM    EP Attending  Patient seen and examined. Agree with the findings as noted above. The patient has had persistent atrial fib with a RVR. He will need uptitration of his medical therapy. I prescribed IV digoxin now. Continue oral diltiazem. I would suggest keeping him an extra day in the hospital.  [name].D.

## 2018-05-28 NOTE — Plan of Care (Signed)
  Problem: Health Behavior/Discharge Planning: Goal: Ability to manage health-related needs will improve Outcome: Progressing   Problem: Clinical Measurements: Goal: Ability to maintain clinical measurements within normal limits will improve Outcome: Progressing Goal: Will remain free from infection Outcome: Progressing Goal: Diagnostic test results will improve Outcome: Progressing Goal: Respiratory complications will improve Outcome: Progressing Goal: Cardiovascular complication will be avoided Outcome: Progressing   Problem: Activity: Goal: Risk for activity intolerance will decrease Outcome: Progressing   Problem: Nutrition: Goal: Adequate nutrition will be maintained Outcome: Progressing   Problem: Coping: Goal: Level of anxiety will decrease Outcome: Progressing   Problem: Elimination: Goal: Will not experience complications related to bowel motility Outcome: Progressing Goal: Will not experience complications related to urinary retention Outcome: Progressing   Problem: Pain Managment: Goal: General experience of comfort will improve Outcome: Progressing   Problem: Safety: Goal: Ability to remain free from injury will improve Outcome: Progressing   Problem: Skin Integrity: Goal: Risk for impaired skin integrity will decrease Outcome: Progressing   Problem: Education: Goal: Knowledge of disease or condition will improve Outcome: Progressing Goal: Understanding of medication regimen will improve Outcome: Progressing Goal: Individualized Educational Video(s) Outcome: Progressing   Problem: Activity: Goal: Ability to tolerate increased activity will improve Outcome: Progressing   Problem: Cardiac: Goal: Ability to achieve and maintain adequate cardiopulmonary perfusion will improve Outcome: Progressing   Problem: Health Behavior/Discharge Planning: Goal: Ability to safely manage health-related needs after discharge will improve Outcome: Progressing  Pt is a/o x4. Hr maintaining 120-130s following admin of dilizem and IV metoprolol this am. Denies chest pain, sob. Ambulated the unit x2 without distress, but hr accelerated to max 160. Cardiology rounded and recommended digoxin.

## 2018-05-29 ENCOUNTER — Encounter (HOSPITAL_COMMUNITY): Payer: Self-pay | Admitting: Internal Medicine

## 2018-05-29 DIAGNOSIS — I5031 Acute diastolic (congestive) heart failure: Secondary | ICD-10-CM

## 2018-05-29 DIAGNOSIS — J449 Chronic obstructive pulmonary disease, unspecified: Secondary | ICD-10-CM

## 2018-05-29 DIAGNOSIS — I10 Essential (primary) hypertension: Secondary | ICD-10-CM

## 2018-05-29 DIAGNOSIS — E669 Obesity, unspecified: Secondary | ICD-10-CM

## 2018-05-29 DIAGNOSIS — Z720 Tobacco use: Secondary | ICD-10-CM

## 2018-05-29 HISTORY — DX: Acute diastolic (congestive) heart failure: I50.31

## 2018-05-29 LAB — BASIC METABOLIC PANEL
Anion gap: 8 (ref 5–15)
BUN: 17 mg/dL (ref 8–23)
CO2: 24 mmol/L (ref 22–32)
Calcium: 8.8 mg/dL — ABNORMAL LOW (ref 8.9–10.3)
Chloride: 103 mmol/L (ref 98–111)
Creatinine, Ser: 0.98 mg/dL (ref 0.61–1.24)
GFR calc Af Amer: >60 mL/min (ref >60)
GFR calc non Af Amer: >60 mL/min (ref >60)
Glucose, Bld: 117 mg/dL — ABNORMAL HIGH (ref 70–99)
Potassium: 4 mmol/L (ref 3.5–5.1)
Sodium: 135 mmol/L (ref 135–145)

## 2018-05-29 LAB — HEMOGLOBIN AND HEMATOCRIT, BLOOD
HCT: 51.3 % (ref 39.0–52.0)
Hemoglobin: 16.9 g/dL (ref 13.0–17.0)

## 2018-05-29 LAB — MAGNESIUM: Magnesium: 2.4 mg/dL (ref 1.7–2.4)

## 2018-05-29 MED ORDER — METOPROLOL TARTRATE 25 MG PO TABS: 25.0000 mg | ORAL_TABLET | Freq: Two times a day (BID) | ORAL | Status: DC

## 2018-05-29 MED ORDER — SODIUM CHLORIDE 0.9 % IV BOLUS: 250.0000 mL | Freq: Once | INTRAVENOUS | Status: DC

## 2018-05-29 MED ORDER — FUROSEMIDE 40 MG PO TABS
40.0000 mg | ORAL_TABLET | Freq: Every day | ORAL | Status: DC
  Administered 2018-05-29 – 2018-05-30 (×2): 40 mg via ORAL
  Filled 2018-05-29 (×2): qty 1

## 2018-05-29 MED ORDER — METOPROLOL TARTRATE 25 MG PO TABS
25.0000 mg | ORAL_TABLET | Freq: Two times a day (BID) | ORAL | Status: DC
  Administered 2018-05-29 (×2): 25 mg via ORAL
  Filled 2018-05-29 (×2): qty 1

## 2018-05-29 MED ORDER — METOPROLOL TARTRATE 5 MG/5ML IV SOLN
5.0000 mg | Freq: Once | INTRAVENOUS | Status: AC
  Administered 2018-05-29: 5 mg via INTRAVENOUS
  Filled 2018-05-29: qty 5

## 2018-05-29 NOTE — Progress Notes (Signed)
Progress Note  Patient Name: Mitchell Parrish Date of Encounter: 05/29/2018  Primary Cardiologist: Cristopher Peru, MD   Subjective   No chest pain or sob.  Inpatient Medications    Scheduled Meds: . apixaban  5 mg Oral BID  . diltiazem  240 mg Oral Daily  . diltiazem  10 mg Intravenous Once  . fluticasone  2 spray Each Nare Daily  . metoprolol tartrate  25 mg Oral BID  . mometasone-formoterol  2 puff Inhalation BID  . umeclidinium bromide  1 puff Inhalation Daily   Continuous Infusions: . diltiazem (CARDIZEM) infusion Stopped (05/27/18 1414)  . sodium chloride     PRN Meds: albuterol, nicotine polacrilex   Vital Signs    Vitals:   05/29/18 0655 05/29/18 0726 05/29/18 0735 05/29/18 0800  BP:  135/77    Pulse: (!) 57 (!) 115    Resp: 11 (!) 23  18  Temp:  97.6 F (36.4 C)    TempSrc:  Oral    SpO2:  96% 97%   Weight:      Height:        Intake/Output Summary (Last 24 hours) at 05/29/2018 0846 Last data filed at 05/29/2018 0800 Gross per 24 hour  Intake 657 ml  Output 2625 ml  Net -1968 ml   Last 3 Weights 05/25/2018 05/25/2018 06/21/2017  Weight (lbs) 256 lb 256 lb 242 lb 3.2 oz  Weight (kg) 116.121 kg 116.121 kg 109.861 kg      Telemetry    AFib 90's-130's - Personally Reviewed  ECG    No new EKGs - Personally Reviewed  Physical Exam    GEN: No acute distress.   Neck: No JVD Cardiac:  irreg-irreg, tachycardic, no murmurs, rubs, or gallops.  Respiratory:  no increased work of breathing . GI: no distention   MS:  No edema; No obvious deformities. Neuro:  Nonfocal  Psych: Normal affect   Labs    Chemistry Recent Labs  Lab 05/25/18 1450  05/27/18 0228 05/28/18 0249 05/29/18 0303  NA 137   < > 136 135 135  K 4.3   < > 3.8 3.8 4.0  CL 108   < > 101 100 103  CO2 21*   < > 25 28 24   GLUCOSE 121*   < > 110* 110* 117*  BUN 21   < > 17 16 17   CREATININE 0.94   < > 1.05 0.97 0.98  CALCIUM 9.3   < > 8.5* 8.5* 8.8*  PROT 7.2  --  6.1*  --    --   ALBUMIN 3.9  --  3.4*  --   --   AST 27  --  25  --   --   ALT 66*  --  60*  --   --   ALKPHOS 83  --  78  --   --   BILITOT 2.3*  --  1.8*  --   --   GFRNONAA >60   < > >60 >60 >60  GFRAA >60   < > >60 >60 >60  ANIONGAP 8   < > 10 7 8    < > = values in this interval not displayed.     Hematology Recent Labs  Lab 05/26/18 0054 05/27/18 0228 05/28/18 0249 05/29/18 0303  WBC 11.7* 8.2 8.7  --   RBC 4.84 4.82 5.00  --   HGB 14.6 15.1 15.8 16.9  HCT 45.7 45.4 46.8 51.3  MCV 94.4 94.2 93.6  --  MCH 30.2 31.3 31.6  --   MCHC 31.9 33.3 33.8  --   RDW 14.0 13.8 13.4  --   PLT 240 221 218  --     Cardiac Enzymes Recent Labs  Lab 05/25/18 1450 05/25/18 2248 05/26/18 0054  TROPONINI <0.03 <0.03 <0.03   No results for input(s): TROPIPOC in the last 168 hours.   BNP Recent Labs  Lab 05/25/18 2248  BNP 198.8*     DDimer  Recent Labs  Lab 05/25/18 1450  DDIMER 0.64*     Radiology    No results found.  Cardiac Studies    05/26/2018: TTE IMPRESSIONS  1. The left ventricle has normal systolic function with an ejection fraction of 60-65%. The cavity size was normal. Left ventricular diastolic function could not be evaluated secondary to atrial fibrillation. No evidence of left ventricular regional  wall motion abnormalities.  2. The right ventricle has normal systolic function. The cavity was normal. There is no increase in right ventricular wall thickness. Right ventricular systolic pressure Normal with an estimated pressure of 31.0 mmHg.  3. Left atrial size was mild-moderately dilated.  4. Right atrial size was mildly dilated.  5. The aortic valve is tricuspid. Mild calcification of the aortic valve.  6. The inferior vena cava was normal in size with <50% respiratory variability.  FINDINGS  Left Ventricle: The left ventricle has normal systolic function, with an ejection fraction of 60-65%. The cavity size was normal. There is no increase in left  ventricular wall thickness. Left ventricular diastolic function could not be evaluated  secondary to atrial fibrillation. No evidence of left ventricular regional wall motion abnormalities.. Right Ventricle: The right ventricle has normal systolic function. The cavity was normal. There is no increase in right ventricular wall thickness. Right ventricular systolic pressure Normal with an estimated pressure of 31.0 mmHg. Left Atrium: Left atrial size was mild-moderately dilated. Right Atrium: Right atrial size was mildly dilated. Right atrial pressure is estimated at 8 mmHg. Interatrial Septum: No atrial level shunt detected by color flow Doppler. Pericardium: There is no evidence of pericardial effusion. Mitral Valve: The mitral valve is normal in structure. Mitral valve regurgitation is trivial by color flow Doppler. Tricuspid Valve: The tricuspid valve is normal in structure. Tricuspid valve regurgitation is mild by color flow Doppler. Aortic Valve: The aortic valve is tricuspid Mild calcification of the aortic valve. Aortic valve regurgitation was not visualized by color flow Doppler. Pulmonic Valve: The pulmonic valve was normal in structure. Pulmonic valve regurgitation is not visualized by color flow Doppler. Venous: The inferior vena cava is normal in size with less than 50% respiratory variability.     Patient Profile     63 y.o. male HTN, HLD, arthritis, obesity, suspected undiagnosed OSA/OHS, of late w/complaints of SOB treated out patient for suspect COPD, presented to [facility] with new rapid AFib and transferred to [facility] for management.  He has neg CT chest for PE  Assessment & Plan     1. New AFib     Still not controlled, rates 90's-130's and up to 140 on my exam this morning     CHA2DS2Vasc is one, will need 3 weeks of uninterrupted a/c prior to consideration of DCCV     On Eliquis, appropriately dosed  On Diltiazem 240mg  QD, getting intermittent IV/PO lopressor, will add routine  PO LVEF 60-65% LA 64mm, no significant MR   2. Acute CHF (diastolic)     Poss 2/2 AF w/RVR  Fluid negative, cumulatively -8473ml >> lasix d/c yesterday, follow     Renal function?K+ are stable     well compensated despite increased VR    3. Suspected OSA/OHS     Needs out patient evaluation, if diagnosed and treated will help with his AF management as well      For questions or updates, please contact [place] Please consult [email or web address] for contact info under   Signed,  [name].D.

## 2018-05-29 NOTE — Progress Notes (Addendum)
   Nurse called for patient's HR going up to 160s at times. Chart reviewed - rate control has been difficult. Per EP note, HR 90s-130s, and up to 140 during their exam. Low dose oral metoprolol 25mg  BID added this morning. During my tele review at present time, HR was 106-119 over 30 seconds time but clearly labile. BP has been soft at times - 90 systolic earlier today, currently 223 systolic. Since HR has been generally similar to when EP saw patient, have told nurse to go ahead and give evening dose of oral metoprolol early now and observe further. Per chart review I do not see any other obvious triggers perpetuating his elevated HR and nurse indicates he's totally asymptomatic. As he has not had much headway made with his HR at rest despite addition of oral rate controlling meds and also intermittent IV digoxin and metoprolol, may need to consider TEE/DCCV this admission. He will have had 6th dose of Eliquis tonight. Will make NPO after midnight and forward message to tomorrow's early APP to discuss early with EP team to decide best plan for patient.  Dayna Dunn PA-C

## 2018-05-29 NOTE — Progress Notes (Signed)
Paged hospitalist and cardiology regarding HR for possible IV metoprolol waiting for the response  Palma Holter, RN

## 2018-05-29 NOTE — Progress Notes (Signed)
Hr in 120-130's. Tab metoprolol given which was just added

## 2018-05-29 NOTE — Progress Notes (Addendum)
PROGRESS NOTE    Mitchell Parrish  UVO:[id number] DOB: Feb 23, 1955 DOA: 05/25/2018 PCP: [name], DO   Brief Narrative:  Patient is a 63 year old male, moderately obese, with past medical history significant for hypertension, hyperlipidemia and arthritis. Patient may also have undiagnosed OSA and OHS. Patient has history of tobacco use. Patient presents with 2-week history of shortness of breath. Apparently, patient was being managed for possible COPD with exacerbation by the primary care provider via telehealth. Patient was noted today to be in atrial fibrillation with heart rate of 150 bpm. Patient was advised to come to the hospital for further assessment and management. Patient was seen at [facility] ER at [facility], started on Cardizem drip, and has been transferred to the hospital for further assessment and management. CTA chest done at the ER was negative for pulmonary embolism. Troponin was within normal limits. No headache, no neck pain, no fever or chills, no chest pain, no GI symptoms and no urinary symptoms. Patient admitted to snoring at nighttime. Patient will be admitted for further assessment and management.  ED Course:Patient was started on Cardizem drip. CTA was negative for PE.   Assessment & Plan:   Principal Problem:   Atrial fibrillation with RVR (HCC) Active Problems:   Acute diastolic CHF (congestive heart failure) (HCC)   Hypertension   Obesity (BMI 30-39.9)   Nicotine abuse   Atrial flutter with rapid ventricular response (HCC)   Other emphysema ([place])   Smoker   Chronic obstructive pulmonary disease ([place])  1 new onset A. fib with RVR- CHA2DS2 Vasc score 1. Patient had presented with shortness of breath x3 weeks and initially treated for COPD by PCP via tele-visit.  Chest x-ray with no acute findings.  Lungs clear on examination.  Patient shortness of breath likely secondary to new onset A. fib.  Patient on a Cardizem drip.  2D echo  done with EF of 60 to 65%, left ventricular diastolic function could not be evaluated secondary to A. fib, no evidence of wall motion abnormalities.  Right ventricle with normal systolic function.  TSH of 1.760.  Patient seen in consultation by cardiology who are recommending systemic anticoagulation for 3 weeks followed by DCCV.  Patient was transitioned to Cardizem 240 mg daily per cardiology on 05/27/2018.  Heart rate still not well controlled sustained the 140s to the 150s.  Patient received a dose of IV Lopressor yesterday as well as a dose of digoxin however rate still not well controlled.  Lopressor 25 mg twice daily per cardiology in addition to oral Cardizem.  If rate remains uncontrolled may need to be cardioverted, however will defer to cardiology.  Continue Eliquis for anticoagulation.  Cardiology following.  He may need outpatient evaluation for OSA.  2.  Acute diastolic heart failure Likely secondary to problem #1.  2D echo with a EF of 60 to 74%, diastolic function could not be evaluated secondary to A. fib, no wall motion abnormalities noted.  TSH within normal limits.  Patient with clinical improvement after diuresis.  Urine output of 2.4-5 L over the past 24 hours.  Strict I's and O's.  Daily weights.  Patient has completed IV Lasix will place on oral Lasix 40 mg daily while in A. fib.  Patient started on oral Cardizem for better rate control per cardiology.  Patient received a dose of digoxin yesterday.  Patient started on Lopressor per cardiology for better rate control.  Aspirin discontinued.  Patient on Eliquis.  Cardiology following.  3.  Hypertension Blood pressure well controlled.  Continue oral Cardizem.  Lopressor added to patient's regimen for better rate control.  Follow.   4.  Obesity  5.  COPD/tobacco abuse Tobacco cessation stressed to patient.  Currently asymptomatic.  Continue Dulera and Incruse.  Continue Flonase.   DVT prophylaxis: Eliquis Code Status: Full Family  Communication: Updated patient.  No family at bedside. Disposition Plan: Home when heart rate better controlled and okay with cardiology.     Consultants:   Cardiology: Dr. Lovena Le 05/27/2018  Procedures:   CT angiogram chest 05/25/2018  Chest x-ray 05/25/2018  2D echo 05/26/2018  Antimicrobials:  None   Subjective: Patient sitting up in chair.  Denies any chest pain or shortness of breath.  Noted to be tachycardic A. fib with heart rate sustaining the 130s to the 150s.  Patient received oral dose Cardizem.  Lopressor added to regimen per cardiology this morning.  No bleeding.    Objective: Vitals:   05/29/18 0655 05/29/18 0726 05/29/18 0735 05/29/18 0800  BP:  135/77    Pulse: (!) 57 (!) 115    Resp: 11 (!) 23  18  Temp:  97.6 F (36.4 C)    TempSrc:  Oral    SpO2:  96% 97%   Weight:      Height:        Intake/Output Summary (Last 24 hours) at 05/29/2018 1034 Last data filed at 05/29/2018 0800 Gross per 24 hour  Intake 657 ml  Output 1125 ml  Net -468 ml   Filed Weights   05/25/18 1437  Weight: 116.1 kg    Examination:  General exam: NAD Respiratory system: CTA B.  No wheezes, no crackles, no rhonchi.  Normal respiratory effort.  Cardiovascular system: Irregularly irregular.  No JVD, no murmurs, no rubs, no gallops.  Trace bilateral lower extremity edema.    Gastrointestinal system: Abdomen is nontender, nondistended, soft, positive bowel sounds.  No rebound.  No guarding.   Central nervous system: Alert and oriented. No focal neurological deficits. Extremities: Symmetric 5 x 5 power. Skin: No rashes, lesions or ulcers Psychiatry: Judgement and insight appear normal. Mood & affect appropriate.     Data Reviewed: I have personally reviewed following labs and imaging studies  CBC: Recent Labs  Lab 05/25/18 1450 05/25/18 2248 05/26/18 0054 05/27/18 0228 05/28/18 0249 05/29/18 0303  WBC 11.5* 11.2* 11.7* 8.2 8.7  --   NEUTROABS 9.6*  --   --  4.5  --    --   HGB 15.3 14.7 14.6 15.1 15.8 16.9  HCT 48.1 45.2 45.7 45.4 46.8 51.3  MCV 96.8 94.6 94.4 94.2 93.6  --   PLT 259 219 240 221 218  --    Basic Metabolic Panel: Recent Labs  Lab 05/25/18 1450 05/25/18 2248 05/26/18 0054 05/27/18 0228 05/28/18 0249 05/29/18 0303  NA 137  --  137 136 135 135  K 4.3  --  4.4 3.8 3.8 4.0  CL 108  --  103 101 100 103  CO2 21*  --  23 25 28 24   GLUCOSE 121*  --  113* 110* 110* 117*  BUN 21  --  17 17 16 17   CREATININE 0.94 0.89 1.04 1.05 0.97 0.98  CALCIUM 9.3  --  9.1 8.5* 8.5* 8.8*  MG  --  2.2  --  2.2 2.3 2.4  PHOS  --  4.8*  --   --   --   --    GFR: Estimated  Creatinine Clearance: 101 mL/min (by C-G formula based on SCr of 0.98 mg/dL). Liver Function Tests: Recent Labs  Lab 05/25/18 1450 05/27/18 0228  AST 27 25  ALT 66* 60*  ALKPHOS 83 78  BILITOT 2.3* 1.8*  PROT 7.2 6.1*  ALBUMIN 3.9 3.4*   No results for input(s): LIPASE, AMYLASE in the last 168 hours. No results for input(s): AMMONIA in the last 168 hours. Coagulation Profile: Recent Labs  Lab 05/25/18 1450  INR 1.1   Cardiac Enzymes: Recent Labs  Lab 05/25/18 1450 05/25/18 2248 05/26/18 0054  TROPONINI <0.03 <0.03 <0.03   BNP (last 3 results) Recent Labs    06/21/17 1625  PROBNP 29.0   HbA1C: No results for input(s): HGBA1C in the last 72 hours. CBG: No results for input(s): GLUCAP in the last 168 hours. Lipid Profile: Recent Labs    05/27/18 0228  CHOL 155  HDL 37*  LDLCALC 100*  TRIG 91  CHOLHDL 4.2   Thyroid Function Tests: Recent Labs    05/27/18 0228  TSH 1.760   Anemia Panel: No results for input(s): VITAMINB12, FOLATE, FERRITIN, TIBC, IRON, RETICCTPCT in the last 72 hours. Sepsis Labs: No results for input(s): PROCALCITON, LATICACIDVEN in the last 168 hours.  Recent Results (from the past 240 hour(s))  MRSA PCR Screening     Status: None   Collection Time: 05/25/18  9:02 PM  Result Value Ref Range Status   MRSA by PCR NEGATIVE  NEGATIVE Final    Comment:        The GeneXpert MRSA Assay (FDA approved for NASAL specimens only), is one component of a comprehensive MRSA colonization surveillance program. It is not intended to diagnose MRSA infection nor to guide or monitor treatment for MRSA infections. Performed at [facility], [place] [address] [place], [place] [address]          Radiology Studies: No results found.      Scheduled Meds: . apixaban  5 mg Oral BID  . diltiazem  240 mg Oral Daily  . diltiazem  10 mg Intravenous Once  . fluticasone  2 spray Each Nare Daily  . furosemide  40 mg Oral Daily  . metoprolol tartrate  25 mg Oral BID  . mometasone-formoterol  2 puff Inhalation BID  . umeclidinium bromide  1 puff Inhalation Daily   Continuous Infusions: . diltiazem (CARDIZEM) infusion Stopped (05/27/18 1414)  . sodium chloride       LOS: 4 days    Time spent: 40 minutes    Irine Seal, MD [facility]  If 7PM-7AM, please contact night-coverage [email or web address] 05/29/2018, 10:34 AM

## 2018-05-29 NOTE — Progress Notes (Signed)
@  1820, 5 beats V-tach, pt up and moving, asymptomatic, HR is upto 150's , MD paged  Palma Holter, RN

## 2018-05-30 ENCOUNTER — Other Ambulatory Visit: Payer: Self-pay | Admitting: Physician Assistant

## 2018-05-30 DIAGNOSIS — I4819 Other persistent atrial fibrillation: Secondary | ICD-10-CM

## 2018-05-30 LAB — BASIC METABOLIC PANEL
Anion gap: 8 (ref 5–15)
BUN: 18 mg/dL (ref 8–23)
CO2: 23 mmol/L (ref 22–32)
Calcium: 8.8 mg/dL — ABNORMAL LOW (ref 8.9–10.3)
Chloride: 104 mmol/L (ref 98–111)
Creatinine, Ser: 0.92 mg/dL (ref 0.61–1.24)
GFR calc Af Amer: >60 mL/min (ref >60)
GFR calc non Af Amer: >60 mL/min (ref >60)
Glucose, Bld: 101 mg/dL — ABNORMAL HIGH (ref 70–99)
Potassium: 4.6 mmol/L (ref 3.5–5.1)
Sodium: 135 mmol/L (ref 135–145)

## 2018-05-30 LAB — HEMOGLOBIN AND HEMATOCRIT, BLOOD
HCT: 51.8 % (ref 39.0–52.0)
Hemoglobin: 17.3 g/dL — ABNORMAL HIGH (ref 13.0–17.0)

## 2018-05-30 MED ORDER — METOPROLOL TARTRATE 5 MG/5ML IV SOLN
5.0000 mg | Freq: Once | INTRAVENOUS | Status: AC
  Administered 2018-05-30: 06:00:00 5 mg via INTRAVENOUS
  Filled 2018-05-30: qty 5

## 2018-05-30 MED ORDER — METOPROLOL TARTRATE 50 MG PO TABS
50.0000 mg | ORAL_TABLET | Freq: Two times a day (BID) | ORAL | Status: DC
  Administered 2018-05-30: 50 mg via ORAL
  Filled 2018-05-30: qty 1

## 2018-05-30 MED ORDER — DILTIAZEM HCL ER COATED BEADS 240 MG PO CP24: 240.0000 mg | ORAL_CAPSULE | Freq: Every day | ORAL | 0 refills | Status: DC

## 2018-05-30 MED ORDER — APIXABAN 5 MG PO TABS: 5.0000 mg | ORAL_TABLET | Freq: Two times a day (BID) | ORAL | 0 refills | Status: DC

## 2018-05-30 MED ORDER — METOPROLOL TARTRATE 50 MG PO TABS: 50.0000 mg | ORAL_TABLET | Freq: Two times a day (BID) | ORAL | 0 refills | Status: DC

## 2018-05-30 MED ORDER — FUROSEMIDE 40 MG PO TABS: 40.0000 mg | ORAL_TABLET | Freq: Every day | ORAL | 0 refills | Status: DC

## 2018-05-30 MED ORDER — FLUTICASONE PROPIONATE 50 MCG/ACT NA SUSP: 2.0000 | NASAL | Status: DC | PRN

## 2018-05-30 NOTE — Progress Notes (Signed)
Progress Note  Patient Name: Mitchell Parrish Date of Encounter: 05/30/2018  Primary Cardiologist: Cristopher Peru, MD   Subjective   No chest pain or sob. "I feel good"  Inpatient Medications    Scheduled Meds: . apixaban  5 mg Oral BID  . diltiazem  240 mg Oral Daily  . diltiazem  10 mg Intravenous Once  . fluticasone  2 spray Each Nare Daily  . furosemide  40 mg Oral Daily  . metoprolol tartrate  50 mg Oral BID  . mometasone-formoterol  2 puff Inhalation BID  . umeclidinium bromide  1 puff Inhalation Daily   Continuous Infusions: . diltiazem (CARDIZEM) infusion Stopped (05/27/18 1414)  . sodium chloride     PRN Meds: albuterol, nicotine polacrilex   Vital Signs    Vitals:   05/29/18 2343 05/30/18 0324 05/30/18 0411 05/30/18 0715  BP: (!) 95/58 94/64  123/74  Pulse: 69 86 61   Resp: 17 16 20    Temp: 98.7 F (37.1 C) 98 F (36.7 C)  97.8 F (36.6 C)  TempSrc: Oral Oral  Oral  SpO2: 91% 92%  94%  Weight:      Height:        Intake/Output Summary (Last 24 hours) at 05/30/2018 0813 Last data filed at 05/30/2018 0717 Gross per 24 hour  Intake 716 ml  Output 800 ml  Net -84 ml   Last 3 Weights 05/25/2018 05/25/2018 06/21/2017  Weight (lbs) 256 lb 256 lb 242 lb 3.2 oz  Weight (kg) 116.121 kg 116.121 kg 109.861 kg      Telemetry    AFib remains uncontrolled, generally 110's-130 - Personally Reviewed  ECG    No new EKGs - Personally Reviewed  Physical Exam    exam is essentially unchanged GEN: No acute distress.   Neck: No JVD Cardiac:  irreg-irreg, tachycardic, no murmurs, rubs, or gallops.  Respiratory:  no increased work of breathing . GI: no distention   MS:  No edema; No obvious deformities. Neuro:  Nonfocal  Psych: Normal affect   Labs    Chemistry Recent Labs  Lab 05/25/18 1450  05/27/18 0228 05/28/18 0249 05/29/18 0303 05/30/18 0237  NA 137   < > 136 135 135 135  K 4.3   < > 3.8 3.8 4.0 4.6  CL 108   < > 101 100 103 104  CO2 21*    < > 25 28 24 23   GLUCOSE 121*   < > 110* 110* 117* 101*  BUN 21   < > 17 16 17 18   CREATININE 0.94   < > 1.05 0.97 0.98 0.92  CALCIUM 9.3   < > 8.5* 8.5* 8.8* 8.8*  PROT 7.2  --  6.1*  --   --   --   ALBUMIN 3.9  --  3.4*  --   --   --   AST 27  --  25  --   --   --   ALT 66*  --  60*  --   --   --   ALKPHOS 83  --  78  --   --   --   BILITOT 2.3*  --  1.8*  --   --   --   GFRNONAA >60   < > >60 >60 >60 >60  GFRAA >60   < > >60 >60 >60 >60  ANIONGAP 8   < > 10 7 8 8    < > = values in this interval  not displayed.     Hematology Recent Labs  Lab 05/26/18 0054 05/27/18 0228 05/28/18 0249 05/29/18 0303 05/30/18 0237  WBC 11.7* 8.2 8.7  --   --   RBC 4.84 4.82 5.00  --   --   HGB 14.6 15.1 15.8 16.9 17.3*  HCT 45.7 45.4 46.8 51.3 51.8  MCV 94.4 94.2 93.6  --   --   MCH 30.2 31.3 31.6  --   --   MCHC 31.9 33.3 33.8  --   --   RDW 14.0 13.8 13.4  --   --   PLT 240 221 218  --   --     Cardiac Enzymes Recent Labs  Lab 05/25/18 1450 05/25/18 2248 05/26/18 0054  TROPONINI <0.03 <0.03 <0.03   No results for input(s): TROPIPOC in the last 168 hours.   BNP Recent Labs  Lab 05/25/18 2248  BNP 198.8*     DDimer  Recent Labs  Lab 05/25/18 1450  DDIMER 0.64*     Radiology    No results found.  Cardiac Studies    05/26/2018: TTE IMPRESSIONS  1. The left ventricle has normal systolic function with an ejection fraction of 60-65%. The cavity size was normal. Left ventricular diastolic function could not be evaluated secondary to atrial fibrillation. No evidence of left ventricular regional  wall motion abnormalities.  2. The right ventricle has normal systolic function. The cavity was normal. There is no increase in right ventricular wall thickness. Right ventricular systolic pressure Normal with an estimated pressure of 31.0 mmHg.  3. Left atrial size was mild-moderately dilated.  4. Right atrial size was mildly dilated.  5. The aortic valve is tricuspid. Mild  calcification of the aortic valve.  6. The inferior vena cava was normal in size with <50% respiratory variability.  FINDINGS  Left Ventricle: The left ventricle has normal systolic function, with an ejection fraction of 60-65%. The cavity size was normal. There is no increase in left ventricular wall thickness. Left ventricular diastolic function could not be evaluated  secondary to atrial fibrillation. No evidence of left ventricular regional wall motion abnormalities.. Right Ventricle: The right ventricle has normal systolic function. The cavity was normal. There is no increase in right ventricular wall thickness. Right ventricular systolic pressure Normal with an estimated pressure of 31.0 mmHg. Left Atrium: Left atrial size was mild-moderately dilated. Right Atrium: Right atrial size was mildly dilated. Right atrial pressure is estimated at 8 mmHg. Interatrial Septum: No atrial level shunt detected by color flow Doppler. Pericardium: There is no evidence of pericardial effusion. Mitral Valve: The mitral valve is normal in structure. Mitral valve regurgitation is trivial by color flow Doppler. Tricuspid Valve: The tricuspid valve is normal in structure. Tricuspid valve regurgitation is mild by color flow Doppler. Aortic Valve: The aortic valve is tricuspid Mild calcification of the aortic valve. Aortic valve regurgitation was not visualized by color flow Doppler. Pulmonic Valve: The pulmonic valve was normal in structure. Pulmonic valve regurgitation is not visualized by color flow Doppler. Venous: The inferior vena cava is normal in size with less than 50% respiratory variability.     Patient Profile     63 y.o. male HTN, HLD, arthritis, obesity, suspected undiagnosed OSA/OHS, of late w/complaints of SOB treated out patient for suspect COPD, presented to [facility] with new rapid AFib and transferred to [facility] for management.  He has neg CT chest for PE  Assessment & Plan     1. New AFib  Still not controlled, rates 90's-130's and up to 140 on my exam this morning     CHA2DS2Vasc is one, will need 3 weeks of uninterrupted a/c prior to consideration of DCCV     On Eliquis, appropriately dosed  On Diltiazem 240mg  QD and lopressor 50mg  BID, still getting PRN IV doses of lopressor BP intermittently 90's, generally better LVEF 60-65% LA 4mm, no significant MR  Rate control has been challenging, not making any headway.  I think his BP could tolerate further titration of BB Will d/w Dr. Lovena Le as well as consideration of TEE/DCCV     2. Acute CHF (diastolic)     Poss 2/2 AF w/RVR     Fluid negative, cumulatively -8574ml follow, off lasix     Renal function K+ are stable      remains well compensated despite increased VR    3. Suspected OSA/OHS     Needs out patient evaluation, if diagnosed and treated will help with his AF management as well     EP Attending  Patient seen and examined. Agree with above. The patient has felt well but has had difficult to control atrial fib. At this point I have recommended he be discharged home on: Metoprolol 50 bid Cardizem 240 daily Lasix 40 daily Eliquis 5 bid  I will see him in the office in 6 weeks. He will need to come by the office on May 6 for a 12 lead ECG and an echo.  [name].D.

## 2018-05-30 NOTE — TOC Transition Note (Signed)
Transition of Care [facility]) - CM/SW Discharge Note   Patient Details  Name: JOJO PEHL MRN: [id number] Date of Birth: 28-Jan-1956  [facility] CM/SW Contact:  Maryclare Labrador, RN Phone Number: 05/30/2018, 11:40 AM   Clinical Narrative:   Pt will discharge home today  - pt will transport via private vehicle.  Pt informed of ongoing copay for Elqiuis - bedside nurse will provide free 30 day Eliquis card.  NO other CM needs identified - CM signing off    Final next level of care: Home/Self Care Barriers to Discharge: Barriers Resolved   Patient Goals and CMS Choice        Discharge Placement                       Discharge Plan and Services                          Social Determinants of Health (SDOH) Interventions     Readmission Risk Interventions No flowsheet data found.

## 2018-05-30 NOTE — Plan of Care (Signed)
  Problem: Health Behavior/Discharge Planning: Goal: Ability to manage health-related needs will improve Outcome: Adequate for Discharge   Problem: Clinical Measurements: Goal: Ability to maintain clinical measurements within normal limits will improve Outcome: Adequate for Discharge Goal: Will remain free from infection Outcome: Adequate for Discharge Goal: Diagnostic test results will improve Outcome: Adequate for Discharge Goal: Respiratory complications will improve Outcome: Adequate for Discharge Goal: Cardiovascular complication will be avoided Outcome: Adequate for Discharge   Problem: Nutrition: Goal: Adequate nutrition will be maintained Outcome: Adequate for Discharge   Problem: Coping: Goal: Level of anxiety will decrease Outcome: Adequate for Discharge   Problem: Elimination: Goal: Will not experience complications related to bowel motility Outcome: Adequate for Discharge Goal: Will not experience complications related to urinary retention Outcome: Adequate for Discharge   Problem: Pain Managment: Goal: General experience of comfort will improve Outcome: Adequate for Discharge   Problem: Safety: Goal: Ability to remain free from injury will improve Outcome: Adequate for Discharge   Problem: Skin Integrity: Goal: Risk for impaired skin integrity will decrease Outcome: Adequate for Discharge   Problem: Education: Goal: Knowledge of disease or condition will improve Outcome: Adequate for Discharge Goal: Understanding of medication regimen will improve Outcome: Adequate for Discharge Goal: Individualized Educational Video(s) Outcome: Adequate for Discharge   Problem: Activity: Goal: Ability to tolerate increased activity will improve Outcome: Adequate for Discharge   Problem: Cardiac: Goal: Ability to achieve and maintain adequate cardiopulmonary perfusion will improve Outcome: Adequate for Discharge   Problem: Health Behavior/Discharge  Planning: Goal: Ability to safely manage health-related needs after discharge will improve Outcome: Adequate for Discharge  DC instructions given to patient. 30 day Eliquis card given to patient. Questions answered, pt educated on s/s regarding new medication regimen and heart rhythm/rate, and follow up appointments. PIV DC, hemostasis achieved. VSS--HR afib 120-150s, MD aware. All belongings sent home with patient. Delayed DC d/t awaiting patient ride.   Pt escorted with NT via wheelchair to private vehicle driven by family.

## 2018-05-30 NOTE — Discharge Summary (Signed)
Physician Discharge Summary  Mitchell Parrish:[id number] DOB: 1955-06-05 DOA: 05/25/2018  PCP: [name], DO  Admit date: 05/25/2018 Discharge date: 05/30/2018  Admitted From: Home Disposition: Home  Recommendations for Outpatient Follow-up:  1. Follow up with PCP in 1 week with repeat CBC/BMP 2. Outpatient follow-up with cardiology 3. Follow up in ED if symptoms worsen or new appear   Home Health: No Equipment/Devices: None  Discharge Condition: Stable CODE STATUS: Full Diet recommendation: Heart healthy  Brief/Interim Summary: 63 year old male with history of hypertension, hyperlipidemia, arthritis, obesity presented with worsening shortness of breath.  He was found to have atrial fibrillation with rapid ventricular rate.  He was started on Cardizem drip.  CTA chest was negative for pulmonary embolism.  Cardiology/EP was consulted.  He has been started on anticoagulation.  He is still tachycardic but cardiology/EP has cleared the patient for discharge.  Discharge Diagnoses:  Principal Problem:   Atrial fibrillation with RVR ([facility]) Active Problems:   Hypertension   Obesity (BMI 30-39.9)   Nicotine abuse   Atrial flutter with rapid ventricular response (HCC)   Other emphysema (HCC)   Smoker   Chronic obstructive pulmonary disease (HCC)   Acute diastolic CHF (congestive heart failure) ([place])  New onset paroxysmal atrial fibrillation -Heart rate still not controlled.  Up to 140 this morning.  Cardiology/EP following.  Currently on Eliquis, metoprolol and oral Cardizem.  Off Cardizem drip. -Cardiology/EP has cleared the patient for discharge on Eliquis along with metoprolol 50 mg twice a day, Cardizem 240 mg daily.  She will need outpatient follow-up with EP and might need outpatient TEE/DCCV.  Acute diastolic CHF -Probably from atrial fibrillation.  Echo showed EF of 60 to 65%.  Negative balance of 8562.2 cc since admission.  Currently euvolemic.  Continue  metoprolol and Lasix.  Outpatient follow-up with cardiology.  Obesity -Might need outpatient evaluation for OSA.  Hypertension -Blood pressure stable.  Continue oral Cardizem, Lasix and Lopressor.  Outpatient follow-up.  Home regimen discontinued.  COPD/tobacco abuse  -Patient is to stop smoking.  Continue home regimen.  Outpatient follow-up    Discharge Instructions  Discharge Instructions    Ambulatory referral to Cardiology   Complete by:  As directed    Diet - low sodium heart healthy   Complete by:  As directed    Increase activity slowly   Complete by:  As directed      Allergies as of 05/30/2018      Reactions   Lodine [etodolac] Hives   Percocet [oxycodone-acetaminophen] Swelling      Medication List    STOP taking these medications   amLODipine-valsartan 5-320 MG tablet Commonly known as:  EXFORGE   predniSONE 20 MG tablet Commonly known as:  DELTASONE     TAKE these medications   apixaban 5 MG Tabs tablet Commonly known as:  ELIQUIS Take 1 tablet (5 mg total) by mouth 2 (two) times daily.   diltiazem 240 MG 24 hr capsule Commonly known as:  CARDIZEM CD Take 1 capsule (240 mg total) by mouth daily. Start taking on:  May 31, 2018   fluticasone 50 MCG/ACT nasal spray Commonly known as:  FLONASE Place 2 sprays into both nostrils as needed for allergies or rhinitis.   furosemide 40 MG tablet Commonly known as:  LASIX Take 1 tablet (40 mg total) by mouth daily. Start taking on:  May 31, 2018   levocetirizine 5 MG tablet Commonly known as:  XYZAL Take 1 tablet (5 mg total) by  mouth every evening.   metoprolol tartrate 50 MG tablet Commonly known as:  LOPRESSOR Take 1 tablet (50 mg total) by mouth 2 (two) times daily.   mirabegron ER 25 MG Tb24 tablet Commonly known as:  MYRBETRIQ Take 1 tablet (25 mg total) by mouth daily.   omeprazole 20 MG capsule Commonly known as:  PRILOSEC TAKE 1 CAPSULE BY MOUTH TWICE DAILY BEFORE A MEAL   OVER  THE COUNTER MEDICATION Take 0.05 drops by mouth as needed. CBD oil 240 mg on the bottle   oxybutynin 5 MG 24 hr tablet Commonly known as:  DITROPAN-XL Take 1 tablet (5 mg total) by mouth at bedtime.   ProAir HFA 108 (90 Base) MCG/ACT inhaler Generic drug:  albuterol INHALE 2 PUFFS INTO THE LUNGS EVERY 4 HOURS AS NEEDED FOR WHEEZING ORSHORTNESS OF BREATH What changed:  See the new instructions.   Symbicort 80-4.5 MCG/ACT inhaler Generic drug:  budesonide-formoterol INHALE 2 PUFFS INTO THE LUNGS TWICE DAILY   tamsulosin 0.4 MG Caps capsule Commonly known as:  FLOMAX Take 1 capsule (0.4 mg total) by mouth daily after supper.   umeclidinium bromide 62.5 MCG/INH Aepb Commonly known as:  INCRUSE ELLIPTA Inhale 1 puff into the lungs daily.      Follow-up Information    [name], DO Follow up in 1 week(s).   Specialty:  Family Medicine Why:  hospital discharge follow up, pcp to refer to outpatient sleep study to r/o sleep apnea Contact information: [address] [place] [address] [phone]        f/u with cardiology for Afib Follow up.          Allergies  Allergen Reactions  . Lodine [Etodolac] Hives  . Percocet [Oxycodone-Acetaminophen] Swelling    Consultations: Cardiology  Procedures/Studies: Ct Angio Chest Pe W And/or Wo Contrast  Result Date: 05/25/2018 CLINICAL DATA:  Shortness of breath over the last 2 weeks. EXAM: CT ANGIOGRAPHY CHEST WITH CONTRAST TECHNIQUE: Multidetector CT imaging of the chest was performed using the standard protocol during bolus administration of intravenous contrast. Multiplanar CT image reconstructions and MIPs were obtained to evaluate the vascular anatomy. CONTRAST:  95mL OMNIPAQUE IOHEXOL 350 MG/ML SOLN COMPARISON:  Radiography same day.  CT 02/26/2014. FINDINGS: Cardiovascular: Heart size is normal. No visible coronary artery calcification. The aorta is of normal caliber without gross  atherosclerotic change. Pulmonary arterial opacification is excellent. There are no pulmonary emboli. Mediastinum/Nodes: There is newly seen adenopathy in the right hilum and subcarinal region. The single largest node is a subcarinal node just to the right of midline measuring 18 mm in diameter. Lungs/Pleura: There is interstitial prominence throughout the lungs. No evidence of consolidation, lobar collapse or pleural effusion. This could represent mild interstitial edema or interstitial pneumonitis. No evidence of focal mass lesion. Few small scattered emphysematous blebs. Upper Abdomen: Chronic benign low-density in the right lobe of the liver, unchanged since the previous study. Chronic right adrenal adenoma. No acute finding. Musculoskeletal: Ordinary mild degenerative changes affect the spine. Review of the MIP images confirms the above findings. IMPRESSION: No pulmonary emboli. Normal heart size. No significant atherosclerosis seen. Development of abnormal interstitial density throughout the lungs. Pattern could represent mild edema or interstitial pneumonitis. No pleural effusion. No dense consolidation or collapse. Newly seen nodal prominence of the right hilum and subcarinal region, not present in 2016. Largest subcarinal node measures 18 mm. No sign of a lung mass. Therefore, these nodes are felt to be reactive. Electronically  Signed   By: Nelson Chimes M.D.   On: 05/25/2018 16:34   Dg Chest Portable 1 View  Result Date: 05/25/2018 CLINICAL DATA:  Shortness of breath and tachycardia EXAM: PORTABLE CHEST 1 VIEW COMPARISON:  Jun 21, 2017 FINDINGS: There is no edema or consolidation. Heart is upper normal in size with pulmonary vascularity normal. No adenopathy. No bone lesions. IMPRESSION: No edema or consolidation. Electronically Signed   By: Lowella Grip III M.D.   On: 05/25/2018 15:19    2D echo IMPRESSIONS    1. The left ventricle has normal systolic function with an ejection fraction of  60-65%. The cavity size was normal. Left ventricular diastolic function could not be evaluated secondary to atrial fibrillation. No evidence of left ventricular regional  wall motion abnormalities.  2. The right ventricle has normal systolic function. The cavity was normal. There is no increase in right ventricular wall thickness. Right ventricular systolic pressure Normal with an estimated pressure of 31.0 mmHg.  3. Left atrial size was mild-moderately dilated.  4. Right atrial size was mildly dilated.  5. The aortic valve is tricuspid. Mild calcification of the aortic valve.  6. The inferior vena cava was normal in size with <50% respiratory variability.   Subjective: Patient seen and examined at bedside.  He feels much better and wants to go home.  Denies any current palpitation or chest pain.  No overnight fever or vomiting.  Discharge Exam: Vitals:   05/30/18 0411 05/30/18 0715  BP:  123/74  Pulse: 61   Resp: 20   Temp:  97.8 F (36.6 C)  SpO2:  94%    General: Pt is alert, awake, not in acute distress Cardiovascular: Still tachycardic, S1/S2 + Respiratory: bilateral decreased breath sounds at bases with some scattered crackles Abdominal: Soft, obese, NT, ND, bowel sounds + Extremities: Trace edema, no cyanosis    The results of significant diagnostics from this hospitalization (including imaging, microbiology, ancillary and laboratory) are listed below for reference.     Microbiology: Recent Results (from the past 240 hour(s))  MRSA PCR Screening     Status: None   Collection Time: 05/25/18  9:02 PM  Result Value Ref Range Status   MRSA by PCR NEGATIVE NEGATIVE Final    Comment:        The GeneXpert MRSA Assay (FDA approved for NASAL specimens only), is one component of a comprehensive MRSA colonization surveillance program. It is not intended to diagnose MRSA infection nor to guide or monitor treatment for MRSA infections. Performed at [facility],  [place] [address], [place], [place] [address]      Labs: BNP (last 3 results) Recent Labs    05/25/18 2248  BNP 774.1*   Basic Metabolic Panel: Recent Labs  Lab 05/25/18 2248 05/26/18 0054 05/27/18 0228 05/28/18 0249 05/29/18 0303 05/30/18 0237  NA  --  137 136 135 135 135  K  --  4.4 3.8 3.8 4.0 4.6  CL  --  103 101 100 103 104  CO2  --  23 25 28 24 23   GLUCOSE  --  113* 110* 110* 117* 101*  BUN  --  17 17 16 17 18   CREATININE 0.89 1.04 1.05 0.97 0.98 0.92  CALCIUM  --  9.1 8.5* 8.5* 8.8* 8.8*  MG 2.2  --  2.2 2.3 2.4  --   PHOS 4.8*  --   --   --   --   --    Liver Function  Tests: Recent Labs  Lab 05/25/18 1450 05/27/18 0228  AST 27 25  ALT 66* 60*  ALKPHOS 83 78  BILITOT 2.3* 1.8*  PROT 7.2 6.1*  ALBUMIN 3.9 3.4*   No results for input(s): LIPASE, AMYLASE in the last 168 hours. No results for input(s): AMMONIA in the last 168 hours. CBC: Recent Labs  Lab 05/25/18 1450 05/25/18 2248 05/26/18 0054 05/27/18 0228 05/28/18 0249 05/29/18 0303 05/30/18 0237  WBC 11.5* 11.2* 11.7* 8.2 8.7  --   --   NEUTROABS 9.6*  --   --  4.5  --   --   --   HGB 15.3 14.7 14.6 15.1 15.8 16.9 17.3*  HCT 48.1 45.2 45.7 45.4 46.8 51.3 51.8  MCV 96.8 94.6 94.4 94.2 93.6  --   --   PLT 259 219 240 221 218  --   --    Cardiac Enzymes: Recent Labs  Lab 05/25/18 1450 05/25/18 2248 05/26/18 0054  TROPONINI <0.03 <0.03 <0.03   BNP: Invalid input(s): POCBNP CBG: No results for input(s): GLUCAP in the last 168 hours. D-Dimer No results for input(s): DDIMER in the last 72 hours. Hgb A1c No results for input(s): HGBA1C in the last 72 hours. Lipid Profile No results for input(s): CHOL, HDL, LDLCALC, TRIG, CHOLHDL, LDLDIRECT in the last 72 hours. Thyroid function studies No results for input(s): TSH, T4TOTAL, T3FREE, THYROIDAB in the last 72 hours.  Invalid input(s): FREET3 Anemia work up No results for input(s): VITAMINB12, FOLATE, FERRITIN, TIBC, IRON, RETICCTPCT in the  last 72 hours. Urinalysis    Component Value Date/Time   BILIRUBINUR small 11/19/2012 1510   PROTEINUR neg 11/19/2012 1510   UROBILINOGEN 2.0 11/19/2012 1510   NITRITE neg 11/19/2012 1510   LEUKOCYTESUR Negative 11/19/2012 1510   Sepsis Labs Invalid input(s): PROCALCITONIN,  WBC,  LACTICIDVEN Microbiology Recent Results (from the past 240 hour(s))  MRSA PCR Screening     Status: None   Collection Time: 05/25/18  9:02 PM  Result Value Ref Range Status   MRSA by PCR NEGATIVE NEGATIVE Final    Comment:        The GeneXpert MRSA Assay (FDA approved for NASAL specimens only), is one component of a comprehensive MRSA colonization surveillance program. It is not intended to diagnose MRSA infection nor to guide or monitor treatment for MRSA infections. Performed at [facility], [place] [address], [place], [place] [address]      Time coordinating discharge: 35 minutes  SIGNED:   Aline August, MD  [facility] 05/30/2018, 11:10 AM

## 2018-06-01 ENCOUNTER — Telehealth: Payer: Self-pay

## 2018-06-01 NOTE — Telephone Encounter (Signed)
Call placed to Pt.  Advised to get ECHO as scheduled on May 4.  Advised he would have an office visit with Dr. Lovena Le May 6 to get EKG.  Pt states his heart rate has returned to normal since being home.

## 2018-06-08 ENCOUNTER — Telehealth: Payer: Self-pay | Admitting: Internal Medicine

## 2018-06-08 NOTE — Telephone Encounter (Signed)
Patient called stating that he has gained 4lbs since he left the hospital and had trouble lying flat in bed last evening due to shortness of breath. He reports good compliance with all of his medications. He believes his heart rate has been normal since the hospital. I confirmed that he is taking lasix 40mg  daily. I instructed him to take an extra dose of lasix in the afternoon for three days to help remove the excess fluid. He and his wife both voiced their understanding. He will call us again if he continues to have trouble sleeping or worsens.

## 2018-06-11 ENCOUNTER — Ambulatory Visit (HOSPITAL_COMMUNITY): Payer: BLUE CROSS/BLUE SHIELD | Attending: Cardiovascular Disease

## 2018-06-11 ENCOUNTER — Other Ambulatory Visit: Payer: Self-pay

## 2018-06-11 DIAGNOSIS — I4819 Other persistent atrial fibrillation: Secondary | ICD-10-CM | POA: Insufficient documentation

## 2018-06-11 MED ORDER — PERFLUTREN LIPID MICROSPHERE
1.0000 mL | INTRAVENOUS | Status: AC | PRN
  Administered 2018-06-11: 2 mL via INTRAVENOUS

## 2018-06-12 ENCOUNTER — Other Ambulatory Visit (HOSPITAL_COMMUNITY): Payer: BLUE CROSS/BLUE SHIELD

## 2018-06-12 ENCOUNTER — Ambulatory Visit (INDEPENDENT_AMBULATORY_CARE_PROVIDER_SITE_OTHER): Payer: BLUE CROSS/BLUE SHIELD | Admitting: Family Medicine

## 2018-06-12 ENCOUNTER — Encounter: Payer: Self-pay | Admitting: Family Medicine

## 2018-06-12 DIAGNOSIS — I4891 Unspecified atrial fibrillation: Secondary | ICD-10-CM

## 2018-06-12 DIAGNOSIS — R0683 Snoring: Secondary | ICD-10-CM | POA: Diagnosis not present

## 2018-06-12 NOTE — Progress Notes (Signed)
Chief Complaint  Patient presents with  . Hospitalization Follow-up    Subjective: Patient is a 63 y.o. male here for hosp f/u. Due to COVID-19 pandemic, we are interacting via web portal for an electronic face-to-face visit. I verified patient's ID using 2 identifiers. Patient agreed to proceed with visit via this method. Patient is at home, I am at home. Patient and I are present for visit.   Admitted to hosp for A fib w RVR, started on Cardizem. Other meds stopped. HR has been 80-90's since he got back. No SOB or fluttering in chest. No CP or LE edema. He is compliant with medications and has been walking more. He quit smoking, caffeine and is trying to cut down on sodium and sugar. Appt with EP tomorrow. He does snore and is interested in being evaluated for OSA.  ROS: Heart: Denies chest pain  Lungs: Denies SOB   Past Medical History:  Diagnosis Date  . Acute diastolic CHF (congestive heart failure) ([facility] [place]) 05/29/2018  . Arthritis    left hip  . Hyperlipidemia   . Hypertension     Objective: No conversational dyspnea Age appropriate judgment and insight Nml affect and mood  Assessment and Plan: Atrial fibrillation, unspecified type ([place]) - Plan: CBC, Basic metabolic panel  Snoring - Plan: Ambulatory referral to Pulmonology  1- f/u on labs. Excellent work with cessation of above. 2- refer to sleep for OSA eval.  F/u in 6 mo or prn.  The patient voiced understanding and agreement to the plan.  [place], DO 06/12/18  2:58 PM

## 2018-06-13 ENCOUNTER — Encounter: Payer: Self-pay | Admitting: Internal Medicine

## 2018-06-13 ENCOUNTER — Ambulatory Visit: Payer: BLUE CROSS/BLUE SHIELD | Admitting: Internal Medicine

## 2018-06-13 ENCOUNTER — Telehealth: Payer: Self-pay | Admitting: Cardiology

## 2018-06-13 ENCOUNTER — Other Ambulatory Visit: Payer: Self-pay

## 2018-06-13 ENCOUNTER — Telehealth: Payer: Self-pay | Admitting: Internal Medicine

## 2018-06-13 ENCOUNTER — Ambulatory Visit: Payer: BLUE CROSS/BLUE SHIELD

## 2018-06-13 VITALS — BP 124/68 | HR 81 | Ht 70.0 in | Wt 250.0 lb

## 2018-06-13 DIAGNOSIS — I4819 Other persistent atrial fibrillation: Secondary | ICD-10-CM

## 2018-06-13 MED ORDER — FUROSEMIDE 40 MG PO TABS: 40.0000 mg | ORAL_TABLET | Freq: Two times a day (BID) | ORAL | 3 refills | Status: DC

## 2018-06-13 NOTE — Patient Instructions (Addendum)
Medication Instructions:  Your physician recommends that you continue on your current medications as directed. Please refer to the Current Medication list given to you today.  Labwork: None ordered.  Testing/Procedures: Your physician has recommended that you have a Cardioversion (DCCV). Electrical Cardioversion uses a jolt of electricity to your heart either through paddles or wired patches attached to your chest. This is a controlled, usually prescheduled, procedure. Defibrillation is done under light anesthesia in the hospital, and you usually go home the day of the procedure. This is done to get your heart back into a normal rhythm. You are not awake for the procedure. Please see the instruction sheet given to you today.   Follow-Up: Your physician wants you to follow-up in: 6 weeks with Dr. Lovena Le after your cardioversion on Jun 25, 2018   Any Other Special Instructions Will Be Listed Below (If Applicable).  If you need a refill on your cardiac medications before your next appointment, please call your pharmacy.    You are scheduled for a TEE/Cardioversion/TEE Cardioversion on Jun 25, 2018 with Dr. Lovena Le.  Please arrive at the [facility] (Main Entrance A) at [facility]: [address] [place], [place] [address] at 6:00 am. (1 hour prior to procedure unless lab work is needed; if lab work is needed arrive 1.5 hours ahead)  DIET: Nothing to eat or drink after midnight except a sip of water with medications (see medication instructions below)  Medication Instructions:  Continue your anticoagulant: Eliquis- do NOT miss ANY doses  On the morning of your procedure take your normal morning medications with a sip of water EXCEPT for LASIX   Labs:  For patients receiving anesthesia for TEE and all Cardioversion patients: BMET, CBC within 1 week  Come to: your lab work will be done at the hospital prior to your procedure - you will need to arrive 1  hours ahead of your  procedure  You must have a responsible person to drive you home and stay in the waiting area during your procedure. Failure to do so could result in cancellation.  Bring your insurance cards.  *Special Note: Every effort is made to have your procedure done on time. Occasionally there are emergencies that occur at the hospital that may cause delays. Please be patient if a delay does occur.

## 2018-06-13 NOTE — Telephone Encounter (Signed)
New Message:   Pt wants to know for the lab work he needs, does he need to fast?

## 2018-06-13 NOTE — H&P (View-Only) (Signed)
HPI Mitchell Parrish returns today for ongoing evaluation and management of his atrial fib with an RVR. He is an obese man with HTN who presented with heart failure symptoms, was diuresed and felt better but his atrial fib was difficult to control. He was discharged as we were not performing elective TEE's. He has had an echo which shows that his LV function is mild/mod reduced with mild LA enlargement. Since DC, he notes that he is feeling better and has gone back to work with no problem. He had an echo just prior to DC from the hospital which showed normal EF but his echo from 5/4  demonstrated reduced LV function. He has been on rate control and he denies missing any of his anti-coagulation.   Allergies  Allergen Reactions  . Lodine [Etodolac] Hives  . Percocet [Oxycodone-Acetaminophen] Swelling     Current Outpatient Medications  Medication Sig Dispense Refill  . apixaban (ELIQUIS) 5 MG TABS tablet Take 1 tablet (5 mg total) by mouth 2 (two) times daily. 60 tablet 0  . diltiazem (CARDIZEM CD) 240 MG 24 hr capsule Take 1 capsule (240 mg total) by mouth daily. 30 capsule 0  . fluticasone (FLONASE) 50 MCG/ACT nasal spray Place 2 sprays into both nostrils as needed for allergies or rhinitis.    . furosemide (LASIX) 40 MG tablet Take 1 tablet (40 mg total) by mouth daily. 30 tablet 0  . metoprolol tartrate (LOPRESSOR) 50 MG tablet Take 1 tablet (50 mg total) by mouth 2 (two) times daily. 60 tablet 0  . OVER THE COUNTER MEDICATION Take 0.05 drops by mouth as needed. CBD oil 240 mg on the bottle    . PROAIR HFA 108 (90 Base) MCG/ACT inhaler INHALE 2 PUFFS INTO THE LUNGS EVERY 4 HOURS AS NEEDED FOR WHEEZING ORSHORTNESS OF BREATH 17 g 0  . SYMBICORT 80-4.5 MCG/ACT inhaler INHALE 2 PUFFS INTO THE LUNGS TWICE DAILY 10.2 g 1  . umeclidinium bromide (INCRUSE ELLIPTA) 62.5 MCG/INH AEPB Inhale 1 puff into the lungs daily. 30 each 5   No current facility-administered medications for this visit.       Past Medical History:  Diagnosis Date  . Acute diastolic CHF (congestive heart failure) ([place]) 05/29/2018  . Arthritis    left hip  . Hyperlipidemia   . Hypertension     ROS:   All systems reviewed and negative except as noted in the HPI.   Past Surgical History:  Procedure Laterality Date  . BACK SURGERY    . HERNIA REPAIR    . SPINE SURGERY     cervical fusion c6, c7 (1994), and c3,4,5,6 (2004)     Family History  Problem Relation Age of Onset  . Diabetes Mother   . Lung cancer Mother 58  . Diabetes Father   . Heart disease Father   . Colon cancer Father 23       rectal cancer     Social History   Socioeconomic History  . Marital status: Married    Spouse name: Not on file  . Number of children: 4  . Years of education: Not on file  . Highest education level: Not on file  Occupational History  . Occupation: TRUCK DRIVER    Employer: [employer]  Social Needs  . Financial resource strain: Not on file  . Food insecurity:    Worry: Not on file    Inability: Not on file  . Transportation needs:    Medical:  Not on file    Non-medical: Not on file  Tobacco Use  . Smoking status: Former Smoker    Packs/day: 1.00    Years: 45.00    Pack years: 45.00    Types: E-cigarettes    Last attempt to quit: 03/15/2016    Years since quitting: 2.2  . Smokeless tobacco: Never Used  Substance and Sexual Activity  . Alcohol use: Yes    Comment: 2-3 drinks/ day  . Drug use: No  . Sexual activity: Yes  Lifestyle  . Physical activity:    Days per week: Not on file    Minutes per session: Not on file  . Stress: Not on file  Relationships  . Social connections:    Talks on phone: Not on file    Gets together: Not on file    Attends religious service: Not on file    Active member of club or organization: Not on file    Attends meetings of clubs or organizations: Not on file    Relationship status: Not on file  . Intimate partner violence:    Fear of current or ex  partner: Not on file    Emotionally abused: Not on file    Physically abused: Not on file    Forced sexual activity: Not on file  Other Topics Concern  . Not on file  Social History Narrative  . Not on file     Ht 5\' 10"  (1.778 m)   BMI 36.73 kg/m   Physical Exam:  Well appearing 63 yo man, NAD HEENT: Unremarkable Neck:  No JVD, no thyromegally Lymphatics:  No adenopathy Back:  No CVA tenderness Lungs:  Clear with no wheezes HEART:  IRegular rate rhythm, no murmurs, no rubs, no clicks Abd:  soft, positive bowel sounds, no organomegally, no rebound, no guarding Ext:  2 plus pulses, no edema, no cyanosis, no clubbing Skin:  No rashes no nodules Neuro:  CN II through XII intact, motor grossly intact  EKG - atrial fib with a  Controlled VR  Assess/Plan: 1. Persistent atrial fib - I have recommended he undergo DCCV. We will schedule in 12 days. He will continue his current meds. 2. Systolic heart failure - his symptoms are controlled now. His EF is down and I strongly suspect that this is tachy mediated. He will continue his lasix and beta blocker. Once he is back in rhythm I would plan to repeat the echo. 3. Obesity - he is encouraged to lose weight. 4. ETOH use - he has 2 drinks a day. I have asked him to try and to drink less.  Mitchell Parrish.D.

## 2018-06-13 NOTE — Progress Notes (Signed)
HPI [name] returns today for ongoing evaluation and management of his atrial fib with an RVR. He is an obese man with HTN who presented with heart failure symptoms, was diuresed and felt better but his atrial fib was difficult to control. He was discharged as we were not performing elective TEE's. He has had an echo which shows that his LV function is mild/mod reduced with mild LA enlargement. Since DC, he notes that he is feeling better and has gone back to work with no problem. He had an echo just prior to DC from the hospital which showed normal EF but his echo from 5/4  demonstrated reduced LV function. He has been on rate control and he denies missing any of his anti-coagulation.   Allergies  Allergen Reactions  . Lodine [Etodolac] Hives  . Percocet [Oxycodone-Acetaminophen] Swelling     Current Outpatient Medications  Medication Sig Dispense Refill  . apixaban (ELIQUIS) 5 MG TABS tablet Take 1 tablet (5 mg total) by mouth 2 (two) times daily. 60 tablet 0  . diltiazem (CARDIZEM CD) 240 MG 24 hr capsule Take 1 capsule (240 mg total) by mouth daily. 30 capsule 0  . fluticasone (FLONASE) 50 MCG/ACT nasal spray Place 2 sprays into both nostrils as needed for allergies or rhinitis.    . furosemide (LASIX) 40 MG tablet Take 1 tablet (40 mg total) by mouth daily. 30 tablet 0  . metoprolol tartrate (LOPRESSOR) 50 MG tablet Take 1 tablet (50 mg total) by mouth 2 (two) times daily. 60 tablet 0  . OVER THE COUNTER MEDICATION Take 0.05 drops by mouth as needed. CBD oil 240 mg on the bottle    . PROAIR HFA 108 (90 Base) MCG/ACT inhaler INHALE 2 PUFFS INTO THE LUNGS EVERY 4 HOURS AS NEEDED FOR WHEEZING ORSHORTNESS OF BREATH 17 g 0  . SYMBICORT 80-4.5 MCG/ACT inhaler INHALE 2 PUFFS INTO THE LUNGS TWICE DAILY 10.2 g 1  . umeclidinium bromide (INCRUSE ELLIPTA) 62.5 MCG/INH AEPB Inhale 1 puff into the lungs daily. 30 each 5   No current facility-administered medications for this visit.       Past Medical History:  Diagnosis Date  . Acute diastolic CHF (congestive heart failure) ([place]) 05/29/2018  . Arthritis    left hip  . Hyperlipidemia   . Hypertension     ROS:   All systems reviewed and negative except as noted in the HPI.   Past Surgical History:  Procedure Laterality Date  . BACK SURGERY    . HERNIA REPAIR    . SPINE SURGERY     cervical fusion c6, c7 (1994), and c3,4,5,6 (2004)     Family History  Problem Relation Age of Onset  . Diabetes Mother   . Lung cancer Mother 32  . Diabetes Father   . Heart disease Father   . Colon cancer Father 69       rectal cancer     Social History   Socioeconomic History  . Marital status: Married    Spouse name: Not on file  . Number of children: 4  . Years of education: Not on file  . Highest education level: Not on file  Occupational History  . Occupation: TRUCK DRIVER    Employer: [employer]  Social Needs  . Financial resource strain: Not on file  . Food insecurity:    Worry: Not on file    Inability: Not on file  . Transportation needs:    Medical:  Not on file    Non-medical: Not on file  Tobacco Use  . Smoking status: Former Smoker    Packs/day: 1.00    Years: 45.00    Pack years: 45.00    Types: E-cigarettes    Last attempt to quit: 03/15/2016    Years since quitting: 2.2  . Smokeless tobacco: Never Used  Substance and Sexual Activity  . Alcohol use: Yes    Comment: 2-3 drinks/ day  . Drug use: No  . Sexual activity: Yes  Lifestyle  . Physical activity:    Days per week: Not on file    Minutes per session: Not on file  . Stress: Not on file  Relationships  . Social connections:    Talks on phone: Not on file    Gets together: Not on file    Attends religious service: Not on file    Active member of club or organization: Not on file    Attends meetings of clubs or organizations: Not on file    Relationship status: Not on file  . Intimate partner violence:    Fear of current or ex  partner: Not on file    Emotionally abused: Not on file    Physically abused: Not on file    Forced sexual activity: Not on file  Other Topics Concern  . Not on file  Social History Narrative  . Not on file     Ht 5\' 10"  (1.778 m)   BMI 36.73 kg/m   Physical Exam:  Well appearing 63 yo man, NAD HEENT: Unremarkable Neck:  No JVD, no thyromegally Lymphatics:  No adenopathy Back:  No CVA tenderness Lungs:  Clear with no wheezes HEART:  IRegular rate rhythm, no murmurs, no rubs, no clicks Abd:  soft, positive bowel sounds, no organomegally, no rebound, no guarding Ext:  2 plus pulses, no edema, no cyanosis, no clubbing Skin:  No rashes no nodules Neuro:  CN II through XII intact, motor grossly intact  EKG - atrial fib with a  Controlled VR  Assess/Plan: 1. Persistent atrial fib - I have recommended he undergo DCCV. We will schedule in 12 days. He will continue his current meds. 2. Systolic heart failure - his symptoms are controlled now. His EF is down and I strongly suspect that this is tachy mediated. He will continue his lasix and beta blocker. Once he is back in rhythm I would plan to repeat the echo. 3. Obesity - he is encouraged to lose weight. 4. ETOH use - he has 2 drinks a day. I have asked him to try and to drink less.  Mitchell Parrish.D.

## 2018-06-13 NOTE — Telephone Encounter (Signed)
New Message:    Mitchell Parrish from [facility] stating her director Gertie Baron would like for appt need to be hold from 06/25/18 on please call concering this appt.

## 2018-06-14 ENCOUNTER — Telehealth: Payer: BLUE CROSS/BLUE SHIELD | Admitting: Cardiology

## 2018-06-14 NOTE — Telephone Encounter (Signed)
[facility] advised that the patients cardioversion was cancelled because they are trying to determine the best times to perform the testing. Let them know if the patient needs the cardioversion right away.

## 2018-06-15 ENCOUNTER — Telehealth: Payer: BLUE CROSS/BLUE SHIELD | Admitting: Cardiology

## 2018-06-15 NOTE — Telephone Encounter (Signed)
° ° °  Patient calling with questions about labwork needed. He wants to be sure he is getting all the lab drawn he needs prior to procedure on 5/18.  He also has questions about procedure.   Please call

## 2018-06-15 NOTE — Telephone Encounter (Signed)
Pt is getting lab work at PCP on Tuesday of next week.  Advised PAT would be in touch to advise on covid testing.  Pt indicates understanding.

## 2018-06-19 ENCOUNTER — Other Ambulatory Visit (INDEPENDENT_AMBULATORY_CARE_PROVIDER_SITE_OTHER): Payer: BLUE CROSS/BLUE SHIELD

## 2018-06-19 ENCOUNTER — Telehealth: Payer: Self-pay | Admitting: *Deleted

## 2018-06-19 ENCOUNTER — Other Ambulatory Visit: Payer: Self-pay

## 2018-06-19 DIAGNOSIS — I4891 Unspecified atrial fibrillation: Secondary | ICD-10-CM | POA: Diagnosis not present

## 2018-06-19 NOTE — Telephone Encounter (Signed)
Patient has been scheduled by scheduler May 14th @ 2:05pm for Pre Procedure COVID19 Testing

## 2018-06-20 LAB — BASIC METABOLIC PANEL
BUN: 18 mg/dL (ref 6–23)
CO2: 26 mEq/L (ref 19–32)
Calcium: 9 mg/dL (ref 8.4–10.5)
Chloride: 102 mEq/L (ref 96–112)
Creatinine, Ser: 1.05 mg/dL (ref 0.40–1.50)
GFR: 71.57 mL/min (ref >60.00)
Glucose, Bld: 95 mg/dL (ref 70–99)
Potassium: 4.1 mEq/L (ref 3.5–5.1)
Sodium: 137 mEq/L (ref 135–145)

## 2018-06-20 LAB — CBC
HCT: 46.2 % (ref 39.0–52.0)
Hemoglobin: 15.9 g/dL (ref 13.0–17.0)
MCHC: 34.4 g/dL (ref 30.0–36.0)
MCV: 92.8 fl (ref 78.0–100.0)
Platelets: 189 10*3/uL (ref 150.0–400.0)
RBC: 4.98 Mil/uL (ref 4.22–5.81)
RDW: 13.6 % (ref 11.5–15.5)
WBC: 8.3 10*3/uL (ref 4.0–10.5)

## 2018-06-21 ENCOUNTER — Other Ambulatory Visit (HOSPITAL_COMMUNITY)
Admission: RE | Admit: 2018-06-21 | Discharge: 2018-06-21 | Disposition: A | Discharge status: Home / Self Care | Payer: BLUE CROSS/BLUE SHIELD | Source: Ambulatory Visit | Attending: Internal Medicine | Admitting: Internal Medicine

## 2018-06-21 ENCOUNTER — Ambulatory Visit (INDEPENDENT_AMBULATORY_CARE_PROVIDER_SITE_OTHER): Payer: BLUE CROSS/BLUE SHIELD | Admitting: Internal Medicine

## 2018-06-21 ENCOUNTER — Other Ambulatory Visit: Payer: Self-pay

## 2018-06-21 ENCOUNTER — Encounter: Payer: Self-pay | Admitting: Internal Medicine

## 2018-06-21 VITALS — BP 128/60 | HR 74 | Ht 70.0 in | Wt 249.4 lb

## 2018-06-21 DIAGNOSIS — G4733 Obstructive sleep apnea (adult) (pediatric): Secondary | ICD-10-CM

## 2018-06-21 DIAGNOSIS — R0683 Snoring: Secondary | ICD-10-CM

## 2018-06-21 DIAGNOSIS — I4892 Unspecified atrial flutter: Secondary | ICD-10-CM | POA: Diagnosis not present

## 2018-06-21 DIAGNOSIS — Z1159 Encounter for screening for other viral diseases: Secondary | ICD-10-CM | POA: Diagnosis not present

## 2018-06-21 DIAGNOSIS — J42 Unspecified chronic bronchitis: Secondary | ICD-10-CM

## 2018-06-21 NOTE — Assessment & Plan Note (Signed)
Recently quit smoking. Followed by his PCP.

## 2018-06-21 NOTE — Assessment & Plan Note (Signed)
He is pending cardioversion, followed by cardiology

## 2018-06-21 NOTE — Patient Instructions (Signed)
Order- please schedule unattended home sleep test   Dx OSA  Ok to take melatonin about an hour before bed

## 2018-06-21 NOTE — Assessment & Plan Note (Signed)
Probable OSA, based on hx and Px. Appropriate education and discussion, including caution not to drive unless alert. Plan- Schedule sleep study, then likely CPAP

## 2018-06-21 NOTE — Progress Notes (Signed)
06/21/2018- 52 yoM former smoker for sleep evaluation. Medical problem list includes HBP, AFib/ Eliquis, dCHF, COPD, Cervical DDD, BPH, Obesity,  Meds include Incruse, Symbicort 80, ProAirhfa, Flonase,  -----referred by Dr. Nani Ravens, never had sleep test, never been on CPAP, pt states he doesn't sleep well at all Epworth score 10 Body weight today 249 lbs Can't sleep longer than 2 or 3 hours at a time.  This pattern goes back many years.  For a long time he will skip sleep some sometimes 2 or 3 days out of a week.  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  2 or 3 cups of morning coffee.  Only  CBD oil in mid afternoon for arthritis in his knees.  Had taken Xanax years ago for sleep.  Some occasional nighttime cramps and nocturia x3 or 4 but denies other parasomnias.  Loud snoring.  Denies ENT surgery but has had several nasal fractures, not repaired. Dr Nani Ravens manages his COPD. Notes raspy tightness in throat if head in certain positions, since C-spine surgery.  Prior to Admission medications   Medication Sig Start Date End Date Taking? Authorizing Provider  apixaban (ELIQUIS) 5 MG TABS tablet Take 1 tablet (5 mg total) by mouth 2 (two) times daily. 05/30/18  Yes Aline August, MD  diltiazem (CARDIZEM CD) 240 MG 24 hr capsule Take 1 capsule (240 mg total) by mouth daily. 05/31/18  Yes Aline August, MD  fluticasone (FLONASE) 50 MCG/ACT nasal spray Place 2 sprays into both nostrils as needed for allergies or rhinitis. 05/30/18  Yes Aline August, MD  furosemide (LASIX) 40 MG tablet Take 1 tablet (40 mg total) by mouth 2 (two) times daily. Patient taking differently: Take 40 mg by mouth 2 (two) times daily. Morning & mid-afternoon 06/13/18 09/11/18 Yes Evans Lance, MD  metoprolol tartrate (LOPRESSOR) 50 MG tablet Take 1 tablet (50 mg total) by mouth 2 (two) times daily. 05/30/18  Yes Aline August, MD  OVER THE COUNTER MEDICATION Take 0.05 drops by mouth daily at 2 PM. CBD oil 240 mg  on the bottle    Yes [provider]  PROAIR HFA 108 (90 Base) MCG/ACT inhaler INHALE 2 PUFFS INTO THE LUNGS EVERY 4 HOURS AS NEEDED FOR WHEEZING ORSHORTNESS OF BREATH Patient taking differently: Inhale 2 puffs into the lungs every 4 (four) hours as needed for wheezing or shortness of breath.  04/05/18  Yes Wendling, Crosby Oyster, DO  SYMBICORT 80-4.5 MCG/ACT inhaler INHALE 2 PUFFS INTO THE LUNGS TWICE DAILY Patient taking differently: Inhale 2 puffs into the lungs 2 (two) times a day.  04/05/18  Yes [name], DO  umeclidinium bromide (INCRUSE ELLIPTA) 62.5 MCG/INH AEPB Inhale 1 puff into the lungs daily. 05/21/18  Yes [name], DO   Past Medical History:  Diagnosis Date  . Acute diastolic CHF (congestive heart failure) ([place]) 05/29/2018  . Arthritis    left hip  . Hyperlipidemia   . Hypertension    Past Surgical History:  Procedure Laterality Date  . BACK SURGERY    . HERNIA REPAIR    . SPINE SURGERY     cervical fusion c6, c7 (1994), and c3,4,5,6 (2004)   Family History  Problem Relation Age of Onset  . Diabetes Mother   . Lung cancer Mother 80  . Diabetes Father   . Heart disease Father   . Colon cancer Father 77       rectal cancer   Social History   Socioeconomic History  .  Marital status: Married    Spouse name: Not on file  . Number of children: 4  . Years of education: Not on file  . Highest education level: Not on file  Occupational History  . Occupation: TRUCK DRIVER    Employer: [employer]  Social Needs  . Financial resource strain: Not on file  . Food insecurity:    Worry: Not on file    Inability: Not on file  . Transportation needs:    Medical: Not on file    Non-medical: Not on file  Tobacco Use  . Smoking status: Former Smoker    Packs/day: 1.00    Years: 45.00    Pack years: 45.00    Types: E-cigarettes    Last attempt to quit: 05/31/2018    Years since quitting: 0.0  . Smokeless tobacco: Never Used   Substance and Sexual Activity  . Alcohol use: Yes    Comment: 2-3 drinks/ day  . Drug use: No  . Sexual activity: Yes  Lifestyle  . Physical activity:    Days per week: Not on file    Minutes per session: Not on file  . Stress: Not on file  Relationships  . Social connections:    Talks on phone: Not on file    Gets together: Not on file    Attends religious service: Not on file    Active member of club or organization: Not on file    Attends meetings of clubs or organizations: Not on file    Relationship status: Not on file  . Intimate partner violence:    Fear of current or ex partner: Not on file    Emotionally abused: Not on file    Physically abused: Not on file    Forced sexual activity: Not on file  Other Topics Concern  . Not on file  Social History Narrative  . Not on file   ROS-see HPI   + = positive Constitutional:    weight loss, night sweats, fevers, chills, fatigue, lassitude. HEENT:    headaches, difficulty swallowing, tooth/dental problems, sore throat,       sneezing, itching, ear ache, nasal congestion, post nasal drip, snoring CV:    chest pain, orthopnea, PND, swelling in lower extremities, anasarca,                                  dizziness, palpitations Resp:   shortness of breath with exertion or at rest.                productive cough,   +non-productive cough, coughing up of blood.              change in color of mucus.  wheezing.   Skin:    rash or lesions. GI:  No-   heartburn, indigestion, abdominal pain, nausea, vomiting, diarrhea,                 change in bowel habits, loss of appetite GU: dysuria, change in color of urine, no urgency or frequency.   flank pain. MS:   +joint pain, stiffness, decreased range of motion, back pain. Neuro-     nothing unusual Psych:  change in mood or affect.  depression or anxiety.   memory loss.  OBJ- Physical Exam General- Alert, Oriented, Affect-appropriate, Distress- none acute, + overweight Skin-  rash-none, lesions- none, excoriation- none Lymphadenopathy- none Head- atraumatic  Eyes- Gross vision intact, PERRLA, conjunctivae and secretions clear            Ears- Hearing, canals-normal            Nose- Clear, no-Septal dev, mucus, polyps, erosion, perforation             Throat- Mallampati III , mucosa+ mild thrush, drainage- none, tonsils- atrophic Neck- flexible , trachea midline, no stridor , thyroid nl, carotid no bruit Chest - symmetrical excursion , unlabored           Heart/CV- RRR , no murmur , no gallop  , no rub, nl s1 s2                           - JVD- none , edema- none, stasis changes- none, varices- none           Lung- +mild diffuse rhonchi, unlabored, + raspy with laughter, wheeze- none, cough+ , dullness-none, rub- none           Chest wall-  Abd-  Br/ Gen/ Rectal- Not done, not indicated Extrem- cyanosis- none, clubbing, none, atrophy- none, strength- nl Neuro- grossly intact to observation

## 2018-06-22 LAB — NOVEL CORONAVIRUS, NAA (HOSP ORDER, SEND-OUT TO REF LAB; TAT 18-24 HRS): SARS-CoV-2, NAA: NOT DETECTED

## 2018-06-25 ENCOUNTER — Ambulatory Visit (HOSPITAL_COMMUNITY): Admit: 2018-06-25 | Payer: BLUE CROSS/BLUE SHIELD | Admitting: Internal Medicine

## 2018-06-25 ENCOUNTER — Inpatient Hospital Stay (HOSPITAL_COMMUNITY)
Admission: RE | Admit: 2018-06-25 | Discharge: 2018-06-28 | DRG: 309 | Disposition: A | Discharge status: Home / Self Care | Payer: BLUE CROSS/BLUE SHIELD | Attending: Internal Medicine | Admitting: Internal Medicine

## 2018-06-25 ENCOUNTER — Other Ambulatory Visit: Payer: Self-pay

## 2018-06-25 ENCOUNTER — Encounter (HOSPITAL_COMMUNITY): Payer: Self-pay

## 2018-06-25 ENCOUNTER — Encounter (HOSPITAL_COMMUNITY): Payer: Self-pay | Admitting: Internal Medicine

## 2018-06-25 ENCOUNTER — Encounter (HOSPITAL_COMMUNITY)
Admission: RE | Disposition: A | Discharge status: Home / Self Care | Payer: Self-pay | Source: Home / Self Care | Attending: Internal Medicine

## 2018-06-25 DIAGNOSIS — J449 Chronic obstructive pulmonary disease, unspecified: Secondary | ICD-10-CM | POA: Diagnosis not present

## 2018-06-25 DIAGNOSIS — Z7901 Long term (current) use of anticoagulants: Secondary | ICD-10-CM

## 2018-06-25 DIAGNOSIS — I110 Hypertensive heart disease with heart failure: Secondary | ICD-10-CM | POA: Diagnosis not present

## 2018-06-25 DIAGNOSIS — Z8249 Family history of ischemic heart disease and other diseases of the circulatory system: Secondary | ICD-10-CM

## 2018-06-25 DIAGNOSIS — Z23 Encounter for immunization: Secondary | ICD-10-CM

## 2018-06-25 DIAGNOSIS — G4733 Obstructive sleep apnea (adult) (pediatric): Secondary | ICD-10-CM | POA: Diagnosis not present

## 2018-06-25 DIAGNOSIS — I4891 Unspecified atrial fibrillation: Secondary | ICD-10-CM | POA: Diagnosis present

## 2018-06-25 DIAGNOSIS — E785 Hyperlipidemia, unspecified: Secondary | ICD-10-CM | POA: Diagnosis present

## 2018-06-25 DIAGNOSIS — Z87891 Personal history of nicotine dependence: Secondary | ICD-10-CM | POA: Diagnosis not present

## 2018-06-25 DIAGNOSIS — I4819 Other persistent atrial fibrillation: Principal | ICD-10-CM | POA: Diagnosis present

## 2018-06-25 DIAGNOSIS — I5032 Chronic diastolic (congestive) heart failure: Secondary | ICD-10-CM | POA: Diagnosis present

## 2018-06-25 DIAGNOSIS — Z79899 Other long term (current) drug therapy: Secondary | ICD-10-CM

## 2018-06-25 DIAGNOSIS — Z7951 Long term (current) use of inhaled steroids: Secondary | ICD-10-CM | POA: Diagnosis not present

## 2018-06-25 DIAGNOSIS — E669 Obesity, unspecified: Secondary | ICD-10-CM | POA: Diagnosis not present

## 2018-06-25 HISTORY — PX: CARDIOVERSION: EP1203

## 2018-06-25 LAB — BASIC METABOLIC PANEL
Anion gap: 8 (ref 5–15)
BUN: 18 mg/dL (ref 8–23)
CO2: 26 mmol/L (ref 22–32)
Calcium: 8.6 mg/dL — ABNORMAL LOW (ref 8.9–10.3)
Chloride: 103 mmol/L (ref 98–111)
Creatinine, Ser: 0.95 mg/dL (ref 0.61–1.24)
GFR calc Af Amer: >60 mL/min (ref >60)
GFR calc non Af Amer: >60 mL/min (ref >60)
Glucose, Bld: 174 mg/dL — ABNORMAL HIGH (ref 70–99)
Potassium: 4.1 mmol/L (ref 3.5–5.1)
Sodium: 137 mmol/L (ref 135–145)

## 2018-06-25 LAB — MAGNESIUM: Magnesium: 2.4 mg/dL (ref 1.7–2.4)

## 2018-06-25 SURGERY — CARDIOVERSION
Anesthesia: General

## 2018-06-25 SURGERY — CARDIOVERSION (CATH LAB)
Anesthesia: LOCAL

## 2018-06-25 MED ORDER — ALBUTEROL SULFATE (2.5 MG/3ML) 0.083% IN NEBU: 3.0000 mL | INHALATION_SOLUTION | RESPIRATORY_TRACT | Status: DC | PRN

## 2018-06-25 MED ORDER — ONDANSETRON HCL 4 MG/2ML IJ SOLN: 4.0000 mg | Freq: Four times a day (QID) | INTRAMUSCULAR | Status: DC | PRN

## 2018-06-25 MED ORDER — SODIUM CHLORIDE 0.9% FLUSH
3.0000 mL | Freq: Two times a day (BID) | INTRAVENOUS | Status: DC
  Administered 2018-06-25 – 2018-06-27 (×5): 3 mL via INTRAVENOUS

## 2018-06-25 MED ORDER — SODIUM CHLORIDE 0.9 % IV SOLN
INTRAVENOUS | Status: DC
  Administered 2018-06-25: 07:00:00 via INTRAVENOUS

## 2018-06-25 MED ORDER — APIXABAN 5 MG PO TABS
5.0000 mg | ORAL_TABLET | Freq: Two times a day (BID) | ORAL | Status: DC
  Administered 2018-06-25 – 2018-06-28 (×6): 5 mg via ORAL
  Filled 2018-06-25 (×6): qty 1

## 2018-06-25 MED ORDER — DILTIAZEM HCL ER COATED BEADS 240 MG PO CP24
240.0000 mg | ORAL_CAPSULE | Freq: Every day | ORAL | Status: DC
  Administered 2018-06-26 – 2018-06-28 (×3): 240 mg via ORAL
  Filled 2018-06-25 (×3): qty 1

## 2018-06-25 MED ORDER — MOMETASONE FURO-FORMOTEROL FUM 100-5 MCG/ACT IN AERO
2.0000 | INHALATION_SPRAY | Freq: Two times a day (BID) | RESPIRATORY_TRACT | Status: DC
  Administered 2018-06-25 – 2018-06-28 (×6): 2 via RESPIRATORY_TRACT
  Filled 2018-06-25: qty 8.8

## 2018-06-25 MED ORDER — DOFETILIDE 500 MCG PO CAPS
500.0000 ug | ORAL_CAPSULE | Freq: Two times a day (BID) | ORAL | Status: DC
  Administered 2018-06-25 – 2018-06-26 (×3): 500 ug via ORAL
  Filled 2018-06-25 (×3): qty 1

## 2018-06-25 MED ORDER — DOFETILIDE 500 MCG PO CAPS: 500.0000 ug | ORAL_CAPSULE | Freq: Two times a day (BID) | ORAL | Status: DC

## 2018-06-25 MED ORDER — FENTANYL CITRATE (PF) 100 MCG/2ML IJ SOLN
INTRAMUSCULAR | Status: DC | PRN
  Administered 2018-06-25 (×3): 25 ug via INTRAVENOUS

## 2018-06-25 MED ORDER — UMECLIDINIUM BROMIDE 62.5 MCG/INH IN AEPB
1.0000 | INHALATION_SPRAY | Freq: Every day | RESPIRATORY_TRACT | Status: DC
  Administered 2018-06-26 – 2018-06-28 (×3): 1 via RESPIRATORY_TRACT
  Filled 2018-06-25: qty 7

## 2018-06-25 MED ORDER — SODIUM CHLORIDE 0.9 % IV SOLN: 250.0000 mL | INTRAVENOUS | Status: DC | PRN

## 2018-06-25 MED ORDER — MIDAZOLAM HCL 5 MG/5ML IJ SOLN
INTRAMUSCULAR | Status: AC
  Filled 2018-06-25: qty 5

## 2018-06-25 MED ORDER — FLUTICASONE PROPIONATE 50 MCG/ACT NA SUSP: 2.0000 | Freq: Every day | NASAL | Status: DC | PRN

## 2018-06-25 MED ORDER — FENTANYL CITRATE (PF) 100 MCG/2ML IJ SOLN
INTRAMUSCULAR | Status: AC
  Filled 2018-06-25: qty 2

## 2018-06-25 MED ORDER — MIDAZOLAM HCL 2 MG/2ML IJ SOLN
INTRAMUSCULAR | Status: DC | PRN
  Administered 2018-06-25 (×4): 2 mg via INTRAVENOUS

## 2018-06-25 MED ORDER — METOPROLOL TARTRATE 50 MG PO TABS
50.0000 mg | ORAL_TABLET | Freq: Two times a day (BID) | ORAL | Status: DC
  Administered 2018-06-25 – 2018-06-28 (×6): 50 mg via ORAL
  Filled 2018-06-25 (×6): qty 1

## 2018-06-25 MED ORDER — HYDROCORTISONE 1 % EX CREA
TOPICAL_CREAM | Freq: Three times a day (TID) | CUTANEOUS | Status: DC | PRN
  Administered 2018-06-25 – 2018-06-26 (×2): 1 via TOPICAL
  Filled 2018-06-25: qty 28

## 2018-06-25 MED ORDER — SODIUM CHLORIDE 0.9% FLUSH: 3.0000 mL | INTRAVENOUS | Status: DC | PRN

## 2018-06-25 MED ORDER — ACETAMINOPHEN 325 MG PO TABS
325.0000 mg | ORAL_TABLET | ORAL | Status: DC | PRN
  Administered 2018-06-25: 650 mg via ORAL
  Filled 2018-06-25: qty 2

## 2018-06-25 MED ORDER — SODIUM CHLORIDE 0.9% FLUSH: 3.0000 mL | Freq: Two times a day (BID) | INTRAVENOUS | Status: DC

## 2018-06-25 SURGICAL SUPPLY — 1 items: PAD DEFIB LIFELINK (PAD) ×2 IMPLANT

## 2018-06-25 NOTE — Progress Notes (Signed)
Pharmacy Review for Dofetilide (Tikosyn) Initiation  Admit Complaint: 63 y.o. male admitted 06/25/2018 with atrial fibrillation to be initiated on dofetilide.   Assessment:  Patient Exclusion Criteria: If any screening criteria checked as "Yes", then  patient  should NOT receive dofetilide until criteria item is corrected. If "Yes" please indicate correction plan.  YES  NO Patient  Exclusion Criteria Correction Plan  [x]  []  Baseline QTc interval is greater than or equal to 440 msec. IF above YES box checked dofetilide contraindicated unless patient has ICD; then may proceed if QTc 500-550 msec or with known ventricular conduction abnormalities may proceed with QTc 550-600 msec. QTc = 486   []  [x]  Magnesium level is less than 1.8 mEq/l : Last magnesium:  Lab Results  Component Value Date   MG 2.4 06/25/2018         [x]  [x]  Potassium level is less than 4 mEq/l : Last potassium:  Lab Results  Component Value Date   K 4.1 06/25/2018         []  [x]  Patient is known or suspected to have a digoxin level greater than 2 ng/ml: No results found for: DIGOXIN    []  [x]  Creatinine clearance less than 20 ml/min (calculated using Cockcroft-Gault, actual body weight and serum creatinine): Estimated Creatinine Clearance: 101.5 mL/min (by C-G formula based on SCr of 0.95 mg/dL).    []  [x]  Patient has received drugs known to prolong the QT intervals within the last 48 hours (phenothiazines, tricyclics or tetracyclic antidepressants, erythromycin, H-1 antihistamines, cisapride, fluoroquinolones, azithromycin). Drugs not listed above may have an, as yet, undetected potential to prolong the QT interval, updated information on QT prolonging agents is available at this website:QT prolonging agents   []  [x]  Patient received a dose of hydrochlorothiazide (Oretic) alone or in any combination including triamterene (Dyazide, Maxzide) in the last 48 hours.   []  [x]  Patient received a medication known to increase  dofetilide plasma concentrations prior to initial dofetilide dose:  . Trimethoprim (Primsol, Proloprim) in the last 36 hours . Verapamil (Calan, Verelan) in the last 36 hours or a sustained release dose in the last 72 hours . Megestrol (Megace) in the last 5 days  . Cimetidine (Tagamet) in the last 6 hours . Ketoconazole (Nizoral) in the last 24 hours . Itraconazole (Sporanox) in the last 48 hours  . Prochlorperazine (Compazine) in the last 36 hours    []  [x]  Patient is known to have a history of torsades de pointes; congenital or acquired long QT syndromes.   []  [x]  Patient has received a Class 1 antiarrhythmic with less than 2 half-lives since last dose. (Disopyramide, Quinidine, Procainamide, Lidocaine, Mexiletine, Flecainide, Propafenone)   []  [x]  Patient has received amiodarone therapy in the past 3 months or amiodarone level is greater than 0.3 ng/ml.    Patient has been appropriately anticoagulated with Apixaban (Eliquis). Ordering provider was confirmed at [email or web address] if they are not listed on the [facility] Prescribers list.  Goal of Therapy: Follow renal function, electrolytes, potential drug interactions, and dose adjustment. Provide education and 1 week supply at discharge.  Plan:  [x]   Physician selected initial dose within range recommended for patients level of renal function - will monitor for response.  []   Physician selected initial dose outside of range recommended for patients level of renal function - will discuss if the dose should be altered at this time.   Select One Calculated CrCl  Dose q12h  [x]  > 60 ml/min 500 mcg  []   40-60 ml/min 250 mcg  []  20-40 ml/min 125 mcg   2. Follow up QTc after the first 5 doses, renal function, electrolytes (K & Mg) daily x 3     days, dose adjustment, success of initiation and facilitate 1 week discharge supply as     clinically indicated.  3. Initiate Tikosyn education video (Call [phone] and ask for Tikosyn Video #  [id number]).  4. Place Enrollment Form on the chart for discharge supply of dofetilide.  Nicole Cella, RPh Clinical Pharmacist [phone] Pager: [phone] Please check AMION for all [place] phone numbers After 10:00 PM, call [place] [phone]  2:16 PM 06/25/2018

## 2018-06-25 NOTE — Interval H&P Note (Signed)
History and Physical Interval Note:  06/25/2018 7:29 AM  Mitchell Parrish  has presented today for surgery, with the diagnosis of atrial fibrillation.  The various methods of treatment have been discussed with the patient and family. After consideration of risks, benefits and other options for treatment, the patient has consented to  Procedure(s): CARDIOVERSION (CATH LAB) (N/A) as a surgical intervention.  The patient's history has been reviewed, patient examined, no change in status, stable for surgery.  I have reviewed the patient's chart and labs.  Questions were answered to the patient's satisfaction.     Cristopher Peru

## 2018-06-25 NOTE — H&P (Addendum)
Cardiology Admission History and Physical:   Patient ID: Mitchell Parrish MRN: [id number]; DOB: Jul 21, 1955   Admission date: 06/25/2018  Primary Care Provider: [name], DO Primary Electrophysiologist/Cardiologist: Cristopher Peru, MD    Chief Complaint:  AFib  Patient Profile:   Mitchell Parrish is a 63 y.o. male with PMHx of persistent AFib, HTN, obesity, COPD (former heavy smoker) and new onset persistent AFib  History of Present Illness:   Mitchell Parrish initially found with new AFib w/RVR last month, with CHF, preserved  LVEF, given asymptomatic with fluid management, and COVID restrictions, he was started on a/c, planned for out patient follow up.  He was found again in AFib and f/u echo noted reduction in LVEF and planned for DCCV.  He came in today for DCCV unfortunately failed and admitted for Tikosyn.    Past Medical History:  Diagnosis Date  . Acute diastolic CHF (congestive heart failure) ([place]) 05/29/2018  . Arthritis    left hip  . Hyperlipidemia   . Hypertension     Past Surgical History:  Procedure Laterality Date  . BACK SURGERY    . CARDIOVERSION N/A 06/25/2018   Procedure: CARDIOVERSION (CATH LAB);  Surgeon: Evans Lance, MD;  Location: [facility];  Service: Cardiovascular;  Laterality: N/A;  . HERNIA REPAIR    . SPINE SURGERY     cervical fusion c6, c7 (1994), and c3,4,5,6 (2004)     Medications Prior to Admission: Prior to Admission medications   Medication Sig Start Date End Date Taking? Authorizing Provider  apixaban (ELIQUIS) 5 MG TABS tablet Take 1 tablet (5 mg total) by mouth 2 (two) times daily. 05/30/18  Yes Aline August, MD  diltiazem (CARDIZEM CD) 240 MG 24 hr capsule Take 1 capsule (240 mg total) by mouth daily. 05/31/18  Yes Aline August, MD  fluticasone (FLONASE) 50 MCG/ACT nasal spray Place 2 sprays into both nostrils as needed for allergies or rhinitis. 05/30/18  Yes Aline August, MD  furosemide (LASIX) 40 MG  tablet Take 1 tablet (40 mg total) by mouth 2 (two) times daily. Patient taking differently: Take 40 mg by mouth 2 (two) times daily. Morning & mid-afternoon 06/13/18 09/11/18 Yes Evans Lance, MD  metoprolol tartrate (LOPRESSOR) 50 MG tablet Take 1 tablet (50 mg total) by mouth 2 (two) times daily. 05/30/18  Yes Aline August, MD  OVER THE COUNTER MEDICATION Take 0.05 drops by mouth daily at 2 PM. CBD oil 240 mg on the bottle    Yes [provider]  PROAIR HFA 108 (90 Base) MCG/ACT inhaler INHALE 2 PUFFS INTO THE LUNGS EVERY 4 HOURS AS NEEDED FOR WHEEZING ORSHORTNESS OF BREATH Patient taking differently: Inhale 2 puffs into the lungs every 4 (four) hours as needed for wheezing or shortness of breath.  04/05/18  Yes Wendling, Crosby Oyster, DO  SYMBICORT 80-4.5 MCG/ACT inhaler INHALE 2 PUFFS INTO THE LUNGS TWICE DAILY Patient taking differently: Inhale 2 puffs into the lungs 2 (two) times a day.  04/05/18  Yes [name], DO  umeclidinium bromide (INCRUSE ELLIPTA) 62.5 MCG/INH AEPB Inhale 1 puff into the lungs daily. 05/21/18  Yes [name], DO     Allergies:    Allergies  Allergen Reactions  . Lodine [Etodolac] Hives  . Percocet [Oxycodone-Acetaminophen] Swelling    Social History:   Social History   Socioeconomic History  . Marital status: Married    Spouse name: Not on file  . Number of children: 4  .  Years of education: Not on file  . Highest education level: Not on file  Occupational History  . Occupation: TRUCK DRIVER    Employer: [employer]  Social Needs  . Financial resource strain: Not on file  . Food insecurity:    Worry: Not on file    Inability: Not on file  . Transportation needs:    Medical: Not on file    Non-medical: Not on file  Tobacco Use  . Smoking status: Former Smoker    Packs/day: 1.00    Years: 45.00    Pack years: 45.00    Types: E-cigarettes    Last attempt to quit: 05/31/2018    Years since quitting: 0.0  .  Smokeless tobacco: Never Used  Substance and Sexual Activity  . Alcohol use: Yes    Comment: 2-3 drinks/ day  . Drug use: No  . Sexual activity: Yes  Lifestyle  . Physical activity:    Days per week: Not on file    Minutes per session: Not on file  . Stress: Not on file  Relationships  . Social connections:    Talks on phone: Not on file    Gets together: Not on file    Attends religious service: Not on file    Active member of club or organization: Not on file    Attends meetings of clubs or organizations: Not on file    Relationship status: Not on file  . Intimate partner violence:    Fear of current or ex partner: Not on file    Emotionally abused: Not on file    Physically abused: Not on file    Forced sexual activity: Not on file  Other Topics Concern  . Not on file  Social History Narrative  . Not on file    Family History:   The patient's family history includes Colon cancer (age of onset: 88) in his father; Diabetes in his father and mother; Heart disease in his father; Lung cancer (age of onset: 20) in his mother.    ROS:  Please see the history of present illness.  All other ROS reviewed and negative.     Physical Exam/Data:   Vitals:   06/25/18 0758 06/25/18 0926 06/25/18 1100 06/25/18 1405  BP: (!) 110/91 116/82 122/82 118/81  Pulse: (!) 106 (!) 118 (!) 112 100  Resp: 14   14  Temp:    97.7 F (36.5 C)  TempSrc:    Oral  SpO2: 93% 98% 98% 98%  Weight:    113 kg  Height:    5\' 10"  (1.778 m)   No intake or output data in the 24 hours ending 06/25/18 1719 Last 3 Weights 06/25/2018 06/25/2018 06/21/2018  Weight (lbs) 249 lb 1.6 oz 244 lb 249 lb 6.4 oz  Weight (kg) 112.991 kg 110.678 kg 113.127 kg     Body mass index is 35.74 kg/m.  General:  Well nourished, well developed, in no acute distress HEENT: normal Lymph: no adenopathy Neck: no JVD Endocrine:  No thryomegaly Vascular: No carotid bruits Cardiac:  Irreg-irreg; no murmurs, gallops or ru bs  Lungs:  CTA b/l  Abd: not examined Ext: no edema Musculoskeletal:  No deformities Skin: warm and dry  Neuro:  no  grossfocal abnormalities noted Psych:  Normal affect    EKG:  The ECG that was done today was personally reviewed and demonstrates AFib 126bpm  Relevant CV Studies:  06/11/2018: TTE IMPRESSIONS 1. The left ventricle has mild-moderately reduced systolic  function, with an ejection fraction of 40-45%. The cavity size was normal. Left ventricular diastolic function could not be evaluated secondary to atrial fibrillation. Left ventrical global  hypokinesis without regional wall motion abnormalities.  2. Right ventricular systolic pressure is mildly elevated with an estimated pressure of 29 mmHg.  3. Left atrial size was mildly dilated.  4. Right atrial size was mildly dilated.  5. No stenosis of the aortic valve.  6. The inferior vena cava was dilated in size with >50% respiratory variability.  7. When compared to the prior study: (05/26/18), left ventricular systolic function has deteriorated.  FINDINGS  Left Ventricle: The left ventricle has mild-moderately reduced systolic function, with an ejection fraction of 40-45%. The cavity size was normal. There is no increase in left ventricular wall thickness. Left ventricular diastolic function could not be  evaluated secondary to atrial fibrillation. Left ventrical global hypokinesis without regional wall motion abnormalities. Definity contrast agent was given IV to delineate the left ventricular endocardial borders.  Right Ventricle: Right ventricular systolic pressure is mildly elevated with an estimated pressure of 29 mmHg.  Left Atrium: Left atrial size was mildly dilated.  Right Atrium: Right atrial size was mildly dilated. Right atrial pressure is estimated at 8 mmHg.  Interatrial Septum: No atrial level shunt detected by color flow Doppler.  Pericardium: There is no evidence of pericardial effusion.  Mitral  Valve: The mitral valve is normal in structure. Mitral valve regurgitation is mild by color flow Doppler.  Tricuspid Valve: The tricuspid valve is normal in structure. Tricuspid valve regurgitation is mild by color flow Doppler.  Aortic Valve: The aortic valve is normal in structure. Aortic valve regurgitation was not visualized by color flow Doppler. There is No stenosis of the aortic valve.  Pulmonic Valve: The pulmonic valve was normal in structure. Pulmonic valve regurgitation is not visualized by color flow Doppler.  Venous: The inferior vena cava is dilated in size with greater than 50% respiratory variability.  Compared to previous exam: (05/26/18), left ventricular systolic function has deteriorated.   05/26/18: TTE, LVEF 60-65%   Laboratory Data:  Chemistry Recent Labs  Lab 06/19/18 1514 06/25/18 1247  NA 137 137  K 4.1 4.1  CL 102 103  CO2 26 26  GLUCOSE 95 174*  BUN 18 18  CREATININE 1.05 0.95  CALCIUM 9.0 8.6*  GFRNONAA  --  >60  GFRAA  --  >60  ANIONGAP  --  8    No results for input(s): PROT, ALBUMIN, AST, ALT, ALKPHOS, BILITOT in the last 168 hours. Hematology Recent Labs  Lab 06/19/18 1514  WBC 8.3  RBC 4.98  HGB 15.9  HCT 46.2  MCV 92.8  MCHC 34.4  RDW 13.6  PLT 189.0   Cardiac EnzymesNo results for input(s): TROPONINI in the last 168 hours. No results for input(s): TROPIPOC in the last 168 hours.  BNPNo results for input(s): BNP, PROBNP in the last 168 hours.  DDimer No results for input(s): DDIMER in the last 168 hours.  Radiology/Studies:  No results found.  Assessment and Plan:   1. Persistent AFib      CHA2DS2Vasc is one on Eliquis, appropriately dosed, no missed doses     S/p failed DCCV today  Plan for Tikosyn EKG is reviewed by Dr. Lovena Le, QTc is felt to be less then machine read, and cleared to start K+ 4.1 Mag 2.4 Creat 0.95, calc CrCl is 114  Start 569mcg tonight  2. COPD      Resume  home tx/inhalers   For  questions or updates, please contact [place] Please consult [email or web address] for contact info under   Signed, Baldwin Jamaica, PA-C  06/25/2018 5:19 PM   EP Attending  Patient seen and examined. Agree with above. The patient was hospitalized several weeks ago with uncontrolled atrial fib and diastolic heart failure. His rates were very difficulty to control and was not cardioverted initially as he had to become therapuetic with systemic anti-coagulation. He presents today for DCCV. The anesthesia service cannot perform his procedure and he was given IV versed and fentanyl under my direct supervision and underwent DCCV times 2 and could not be returned to NSR. He will be admitted for inpatient initiation of dofetilide. I have reviewe QT and electrolytes and they are acceptable. I would anticipate repeat DCCV in 3 days. We will follow his QT interval closely.  [name].D.

## 2018-06-26 ENCOUNTER — Encounter (HOSPITAL_COMMUNITY): Payer: Self-pay

## 2018-06-26 LAB — BASIC METABOLIC PANEL
Anion gap: 10 (ref 5–15)
BUN: 14 mg/dL (ref 8–23)
CO2: 24 mmol/L (ref 22–32)
Calcium: 8.5 mg/dL — ABNORMAL LOW (ref 8.9–10.3)
Chloride: 102 mmol/L (ref 98–111)
Creatinine, Ser: 0.86 mg/dL (ref 0.61–1.24)
GFR calc Af Amer: >60 mL/min (ref >60)
GFR calc non Af Amer: >60 mL/min (ref >60)
Glucose, Bld: 139 mg/dL — ABNORMAL HIGH (ref 70–99)
Potassium: 4 mmol/L (ref 3.5–5.1)
Sodium: 136 mmol/L (ref 135–145)

## 2018-06-26 LAB — MAGNESIUM: Magnesium: 2.3 mg/dL (ref 1.7–2.4)

## 2018-06-26 MED ORDER — PNEUMOCOCCAL VAC POLYVALENT 25 MCG/0.5ML IJ INJ: 0.5000 mL | INJECTION | INTRAMUSCULAR | Status: DC

## 2018-06-26 MED ORDER — FUROSEMIDE 40 MG PO TABS
40.0000 mg | ORAL_TABLET | Freq: Every day | ORAL | Status: DC
  Administered 2018-06-26 – 2018-06-28 (×3): 40 mg via ORAL
  Filled 2018-06-26 (×3): qty 1

## 2018-06-26 NOTE — Progress Notes (Signed)
Pharmacy Review for Dofetilide (Tikosyn) Initiation  Admit Complaint: 62 y.o. male admitted 06/25/2018 with atrial fibrillation to be initiated on dofetilide.   Assessment:  Patient Exclusion Criteria: If any screening criteria checked as "Yes", then  patient  should NOT receive dofetilide until criteria item is corrected. If "Yes" please indicate correction plan.  YES  NO Patient  Exclusion Criteria Correction Plan  [x]  []  Baseline QTc interval is greater than or equal to 440 msec. IF above YES box checked dofetilide contraindicated unless patient has ICD; then may proceed if QTc 500-550 msec or with known ventricular conduction abnormalities may proceed with QTc 550-600 msec. QTc = 486   []  [x]  Magnesium level is less than 1.8 mEq/l : Last magnesium:  Lab Results  Component Value Date   MG 2.3 06/26/2018         [x]  [x]  Potassium level is less than 4 mEq/l : Last potassium:  Lab Results  Component Value Date   K 4.0 06/26/2018         []  [x]  Patient is known or suspected to have a digoxin level greater than 2 ng/ml: No results found for: DIGOXIN    []  [x]  Creatinine clearance less than 20 ml/min (calculated using Cockcroft-Gault, actual body weight and serum creatinine): Estimated Creatinine Clearance: 112.1 mL/min (by C-G formula based on SCr of 0.86 mg/dL).    []  [x]  Patient has received drugs known to prolong the QT intervals within the last 48 hours (phenothiazines, tricyclics or tetracyclic antidepressants, erythromycin, H-1 antihistamines, cisapride, fluoroquinolones, azithromycin). Drugs not listed above may have an, as yet, undetected potential to prolong the QT interval, updated information on QT prolonging agents is available at this website:QT prolonging agents   []  [x]  Patient received a dose of hydrochlorothiazide (Oretic) alone or in any combination including triamterene (Dyazide, Maxzide) in the last 48 hours.   []  [x]  Patient received a medication known to increase  dofetilide plasma concentrations prior to initial dofetilide dose:  . Trimethoprim (Primsol, Proloprim) in the last 36 hours . Verapamil (Calan, Verelan) in the last 36 hours or a sustained release dose in the last 72 hours . Megestrol (Megace) in the last 5 days  . Cimetidine (Tagamet) in the last 6 hours . Ketoconazole (Nizoral) in the last 24 hours . Itraconazole (Sporanox) in the last 48 hours  . Prochlorperazine (Compazine) in the last 36 hours    []  [x]  Patient is known to have a history of torsades de pointes; congenital or acquired long QT syndromes.   []  [x]  Patient has received a Class 1 antiarrhythmic with less than 2 half-lives since last dose. (Disopyramide, Quinidine, Procainamide, Lidocaine, Mexiletine, Flecainide, Propafenone)   []  [x]  Patient has received amiodarone therapy in the past 3 months or amiodarone level is greater than 0.3 ng/ml.    Patient has been appropriately anticoagulated with Apixaban (Eliquis). Ordering provider was confirmed at [email or web address] if they are not listed on the [place] Prescribers list.  Goal of Therapy: Follow renal function, electrolytes, potential drug interactions, and dose adjustment. Provide education and 1 week supply at discharge.  Plan:  [x]   Physician selected initial dose within range recommended for patients level of renal function - will monitor for response.  []   Physician selected initial dose outside of range recommended for patients level of renal function - will discuss if the dose should be altered at this time.   Select One Calculated CrCl  Dose q12h  [x]  > 60 ml/min 500 mcg  []   40-60 ml/min 250 mcg  []  20-40 ml/min 125 mcg   2. Follow up QTc after the first 5 doses, renal function, electrolytes (K & Mg) daily x 3     days, dose adjustment, success of initiation and facilitate 1 week discharge supply as     clinically indicated.  3. Initiate Tikosyn education video (Call [phone] and ask for Tikosyn Video #  [id number]).  4. Place Enrollment Form on the chart for discharge supply of dofetilide.   Arrie Senate, PharmD, BCPS Clinical Pharmacist [phone] Please check AMION for all [place] numbers 06/26/2018

## 2018-06-26 NOTE — Progress Notes (Addendum)
Post dose EKG is reviewed SR 61bpm, manually measured QT 480-500mg , QTc 484-541ms Telemetry reviewed  Continue 541mcg dose tonight Follow QT closely  Tommye Standard, PA-C  EP attending  Agree with above.  Cristopher Peru, MD

## 2018-06-26 NOTE — Progress Notes (Addendum)
Progress Note  Patient Name: Mitchell Parrish Date of Encounter: 06/26/2018  Primary Cardiologist: Cristopher Peru, MD   Subjective   Thinks he can tell the difference in SR this AM.  Tolerating drug.  Symptoms of sleep apnea reports (longstanding for years)  Inpatient Medications    Scheduled Meds: . apixaban  5 mg Oral BID  . diltiazem  240 mg Oral Daily  . dofetilide  500 mcg Oral BID  . furosemide  40 mg Oral Daily  . metoprolol tartrate  50 mg Oral BID  . mometasone-formoterol  2 puff Inhalation BID  . [START ON 06/28/2018] pneumococcal 23 valent vaccine  0.5 mL Intramuscular Tomorrow-1000  . sodium chloride flush  3 mL Intravenous Q12H  . umeclidinium bromide  1 puff Inhalation Daily   Continuous Infusions: . sodium chloride     PRN Meds: sodium chloride, acetaminophen, albuterol, fluticasone, hydrocortisone cream, sodium chloride flush   Vital Signs    Vitals:   06/26/18 0528 06/26/18 0530 06/26/18 0740 06/26/18 0743  BP: 129/81   (!) 137/94  Pulse: 72  75   Resp: 12     Temp: (!) 97.5 F (36.4 C)     TempSrc: Axillary     SpO2: 98%     Weight:  112.9 kg    Height:        Intake/Output Summary (Last 24 hours) at 06/26/2018 0915 Last data filed at 06/26/2018 0530 Gross per 24 hour  Intake 718 ml  Output 1425 ml  Net -707 ml   Last 3 Weights 06/26/2018 06/25/2018 06/25/2018  Weight (lbs) 248 lb 14.4 oz 249 lb 1.6 oz 244 lb  Weight (kg) 112.9 kg 112.991 kg 110.678 kg      Telemetry    SR 70's - Personally Reviewed  ECG    SR, QTc in SR this AM is OK - Personally Reviewed  Physical Exam    GEN: No acute distress.   Neck: No JVD Cardiac: RRR, no murmurs, rubs, or gallops.  Respiratory: CTA b/l. GI: Soft, nontender, non-distended  MS: No edema; No deformity. Neuro:  Nonfocal  Psych: Normal affect   Labs    Chemistry Recent Labs  Lab 06/19/18 1514 06/25/18 1247 06/26/18 0438  NA 137 137 136  K 4.1 4.1 4.0  CL 102 103 102  CO2 26 26 24    GLUCOSE 95 174* 139*  BUN 18 18 14   CREATININE 1.05 0.95 0.86  CALCIUM 9.0 8.6* 8.5*  GFRNONAA  --  >60 >60  GFRAA  --  >60 >60  ANIONGAP  --  8 10     Hematology Recent Labs  Lab 06/19/18 1514  WBC 8.3  RBC 4.98  HGB 15.9  HCT 46.2  MCV 92.8  MCHC 34.4  RDW 13.6  PLT 189.0    Cardiac EnzymesNo results for input(s): TROPONINI in the last 168 hours. No results for input(s): TROPIPOC in the last 168 hours.   BNPNo results for input(s): BNP, PROBNP in the last 168 hours.   DDimer No results for input(s): DDIMER in the last 168 hours.   Radiology    No results found.  Cardiac Studies   06/11/2018: TTE IMPRESSIONS 1. The left ventricle has mild-moderately reduced systolic function, with an ejection fraction of 40-45%. The cavity size was normal. Left ventricular diastolic function could not be evaluated secondary to atrial fibrillation. Left ventrical global  hypokinesis without regional wall motion abnormalities. 2. Right ventricular systolic pressure is mildly elevated with an estimated  pressure of 29 mmHg. 3. Left atrial size was mildly dilated. 4. Right atrial size was mildly dilated. 5. No stenosis of the aortic valve. 6. The inferior vena cava was dilated in size with >50% respiratory variability. 7. When compared to the prior study: (05/26/18), left ventricular systolic function has deteriorated.  FINDINGS Left Ventricle: The left ventricle has mild-moderately reduced systolic function, with an ejection fraction of 40-45%. The cavity size was normal. There is no increase in left ventricular wall thickness. Left ventricular diastolic function could not be  evaluated secondary to atrial fibrillation. Left ventrical global hypokinesis without regional wall motion abnormalities. Definity contrast agent was given IV to delineate the left ventricular endocardial borders.  Right Ventricle: Right ventricular systolic pressure is mildly elevated with an estimated  pressure of 29 mmHg.  Left Atrium: Left atrial size was mildly dilated.  Right Atrium: Right atrial size was mildly dilated. Right atrial pressure is estimated at 8 mmHg.  Interatrial Septum: No atrial level shunt detected by color flow Doppler.  Pericardium: There is no evidence of pericardial effusion.  Mitral Valve: The mitral valve is normal in structure. Mitral valve regurgitation is mild by color flow Doppler.  Tricuspid Valve: The tricuspid valve is normal in structure. Tricuspid valve regurgitation is mild by color flow Doppler.  Aortic Valve: The aortic valve is normal in structure. Aortic valve regurgitation was not visualized by color flow Doppler. There is No stenosis of the aortic valve.  Pulmonic Valve: The pulmonic valve was normal in structure. Pulmonic valve regurgitation is not visualized by color flow Doppler.  Venous: The inferior vena cava is dilated in size with greater than 50% respiratory variability.  Compared to previous exam: (05/26/18), left ventricular systolic function has deteriorated.   05/26/18: TTE, LVEF 60-65%   Patient Profile     63 y.o. male with PMHx of persistent AFib, HTN, obesity, COPD (former heavy smoker) and new onset persistent AFib.  Initially found with new AFib w/RVR last month, with CHF, preserved  LVEF, given asymptomatic with fluid management, and COVID restrictions, he was started on a/c, planned for out patient follow up.  He was found again in AFib and f/u echo noted reduction in LVEF and planned for DCCV.  He came in yesterday for DCCV unfortunately failed and admitted for Tikosyn.   Assessment & Plan    1. Persistent AFib      CHA2DS2Vasc is one on Eliquis, appropriately dosed, no missed doses     S/p failed DCCV yesterday  Tikosyn started EKG is reviewed with Dr. Lovena Le, QTc is OK in SR this AM K+ 4.0 Mag 2.3 Creat 0.86, stable  Resumed his lasix this AM, decreased to daily, daily BMET    2.  COPD     continue home tx/inhalers  For questions or updates, please contact [place] Please consult [email or web address] for contact info under        Signed, Baldwin Jamaica, PA-C  06/26/2018, 9:15 AM    EP attending  Patient seen and examined.  Agree with the findings as noted above.  The patient has reverted to sinus rhythm.  His QT interval is acceptable.  He feels better.  He will be monitored with careful observation of his QT interval.  Continue other medications.  We will follow his renal function and potassium.  His diuretic will be adjusted.  Cristopher Peru, MD

## 2018-06-26 NOTE — TOC Benefit Eligibility Note (Signed)
Transition of Care [facility]) Benefit Eligibility Note    Patient Details  Name: Mitchell Parrish MRN: [id number] Date of Birth: Aug 25, 1955   Medication/Dose: Phyllis Ginger   125 MCG , 250 MCG , 500 MCG BID  Covered?: No     Prescription Coverage Preferred Pharmacy: YES([facility])  Spoke with Person/Company/Phone Number:: [employer] # [phone])     Prior Approval: [phone])     Additional Notes: DOFETILIDE  125 MCG , 250 MCG, 500 MCG BID (COVER- YES ,CO-PAY- ZERO DOLLARS TIER- NO P/A -NO)    Memory Argue Phone Number: 06/26/2018, 9:19 AM

## 2018-06-26 NOTE — Progress Notes (Signed)
Benefits check completed for Tikosyn. No copay or prior authorization required for generic dofetilide (Tikosyn). TOC pharmacy notified of transition home later this week. TOC pharmacy to provide 30 days at discharge. If patient to transition home over weekend, please send script to [facility] on Friday to be filled.   Manya Silvas, RN MSN CCM Transitions of Care CM coverage [phone]

## 2018-06-27 LAB — BASIC METABOLIC PANEL
Anion gap: 9 (ref 5–15)
BUN: 12 mg/dL (ref 8–23)
CO2: 24 mmol/L (ref 22–32)
Calcium: 8.6 mg/dL — ABNORMAL LOW (ref 8.9–10.3)
Chloride: 104 mmol/L (ref 98–111)
Creatinine, Ser: 0.88 mg/dL (ref 0.61–1.24)
GFR calc Af Amer: >60 mL/min (ref >60)
GFR calc non Af Amer: >60 mL/min (ref >60)
Glucose, Bld: 109 mg/dL — ABNORMAL HIGH (ref 70–99)
Potassium: 4.1 mmol/L (ref 3.5–5.1)
Sodium: 137 mmol/L (ref 135–145)

## 2018-06-27 LAB — MAGNESIUM: Magnesium: 2.3 mg/dL (ref 1.7–2.4)

## 2018-06-27 MED ORDER — DOFETILIDE 250 MCG PO CAPS
250.0000 ug | ORAL_CAPSULE | Freq: Two times a day (BID) | ORAL | Status: DC
  Administered 2018-06-27 (×2): 250 ug via ORAL
  Filled 2018-06-27 (×2): qty 1

## 2018-06-27 NOTE — Progress Notes (Signed)
Qtc post 3rd dose of Tikosyn 531 per EKG. Mitchell Parrish

## 2018-06-27 NOTE — Progress Notes (Signed)
Pt had 3 bts NSVT. Strip saved per CCMD. Jessie Foot, RN

## 2018-06-27 NOTE — Progress Notes (Addendum)
Progress Note  Patient Name: Mitchell Parrish Date of Encounter: 06/27/2018  Primary Cardiologist: Cristopher Peru, MD   Subjective   Thinks he can tell the difference in SR this AM.  Tolerating drug.  Disappointed to hear he was back in AF, though was unable to tell the difference this AM  Inpatient Medications    Scheduled Meds: . apixaban  5 mg Oral BID  . diltiazem  240 mg Oral Daily  . dofetilide  250 mcg Oral BID  . furosemide  40 mg Oral Daily  . metoprolol tartrate  50 mg Oral BID  . mometasone-formoterol  2 puff Inhalation BID  . [START ON 06/28/2018] pneumococcal 23 valent vaccine  0.5 mL Intramuscular Tomorrow-1000  . sodium chloride flush  3 mL Intravenous Q12H  . umeclidinium bromide  1 puff Inhalation Daily   Continuous Infusions: . sodium chloride     PRN Meds: sodium chloride, acetaminophen, albuterol, fluticasone, hydrocortisone cream, sodium chloride flush   Vital Signs    Vitals:   06/26/18 2055 06/27/18 0434 06/27/18 0710 06/27/18 0807  BP: 112/73 114/80 113/63   Pulse: 72 68 (!) 56   Resp: 18 15    Temp: 97.9 F (36.6 C) 98.7 F (37.1 C)    TempSrc: Oral Oral    SpO2: 98% 99% 94% 93%  Weight:  113.4 kg    Height:        Intake/Output Summary (Last 24 hours) at 06/27/2018 0838 Last data filed at 06/27/2018 0800 Gross per 24 hour  Intake 600 ml  Output 1500 ml  Net -900 ml   Last 3 Weights 06/27/2018 06/26/2018 06/25/2018  Weight (lbs) 250 lb 1.6 oz 248 lb 14.4 oz 249 lb 1.6 oz  Weight (kg) 113.445 kg 112.9 kg 112.991 kg      Telemetry    AFib this Am, 110's - Personally Reviewed  ECG    SR, 61bpm, manually measured QT571ms, QTc same - Personally Reviewed  Physical Exam    GEN: No acute distress.   Neck: No JVD Cardiac: irreg-irreg, tachycardic, no murmurs, rubs, or gallops.  Respiratory: CTA b/l. GI: Soft, nontender, non-distended  MS: No edema; No deformity. Neuro:  Nonfocal  Psych: Normal affect   Labs    Chemistry  Recent Labs  Lab 06/25/18 1247 06/26/18 0438 06/27/18 0427  NA 137 136 137  K 4.1 4.0 4.1  CL 103 102 104  CO2 26 24 24   GLUCOSE 174* 139* 109*  BUN 18 14 12   CREATININE 0.95 0.86 0.88  CALCIUM 8.6* 8.5* 8.6*  GFRNONAA >60 >60 >60  GFRAA >60 >60 >60  ANIONGAP 8 10 9      Hematology No results for input(s): WBC, RBC, HGB, HCT, MCV, MCH, MCHC, RDW, PLT in the last 168 hours.  Cardiac EnzymesNo results for input(s): TROPONINI in the last 168 hours. No results for input(s): TROPIPOC in the last 168 hours.   BNPNo results for input(s): BNP, PROBNP in the last 168 hours.   DDimer No results for input(s): DDIMER in the last 168 hours.   Radiology    No results found.  Cardiac Studies   06/11/2018: TTE IMPRESSIONS 1. The left ventricle has mild-moderately reduced systolic function, with an ejection fraction of 40-45%. The cavity size was normal. Left ventricular diastolic function could not be evaluated secondary to atrial fibrillation. Left ventrical global  hypokinesis without regional wall motion abnormalities. 2. Right ventricular systolic pressure is mildly elevated with an estimated pressure of 29 mmHg. 3.  Left atrial size was mildly dilated. 4. Right atrial size was mildly dilated. 5. No stenosis of the aortic valve. 6. The inferior vena cava was dilated in size with >50% respiratory variability. 7. When compared to the prior study: (05/26/18), left ventricular systolic function has deteriorated.  FINDINGS Left Ventricle: The left ventricle has mild-moderately reduced systolic function, with an ejection fraction of 40-45%. The cavity size was normal. There is no increase in left ventricular wall thickness. Left ventricular diastolic function could not be  evaluated secondary to atrial fibrillation. Left ventrical global hypokinesis without regional wall motion abnormalities. Definity contrast agent was given IV to delineate the left ventricular endocardial borders.   Right Ventricle: Right ventricular systolic pressure is mildly elevated with an estimated pressure of 29 mmHg.  Left Atrium: Left atrial size was mildly dilated.  Right Atrium: Right atrial size was mildly dilated. Right atrial pressure is estimated at 8 mmHg.  Interatrial Septum: No atrial level shunt detected by color flow Doppler.  Pericardium: There is no evidence of pericardial effusion.  Mitral Valve: The mitral valve is normal in structure. Mitral valve regurgitation is mild by color flow Doppler.  Tricuspid Valve: The tricuspid valve is normal in structure. Tricuspid valve regurgitation is mild by color flow Doppler.  Aortic Valve: The aortic valve is normal in structure. Aortic valve regurgitation was not visualized by color flow Doppler. There is No stenosis of the aortic valve.  Pulmonic Valve: The pulmonic valve was normal in structure. Pulmonic valve regurgitation is not visualized by color flow Doppler.  Venous: The inferior vena cava is dilated in size with greater than 50% respiratory variability.  Compared to previous exam: (05/26/18), left ventricular systolic function has deteriorated.   05/26/18: TTE, LVEF 60-65%   Patient Profile     63 y.o. male with PMHx of persistent AFib, HTN, obesity, COPD (former heavy smoker) and new onset persistent AFib.  Initially found with new AFib w/RVR last month, with CHF, preserved  LVEF, given asymptomatic with fluid management, and COVID restrictions, he was started on a/c, planned for out patient follow up.  He was found again in AFib and f/u echo noted reduction in LVEF and planned for DCCV.  He came in yesterday for DCCV unfortunately failed and admitted for Tikosyn.   Assessment & Plan    1. Persistent AFib      CHA2DS2Vasc is one on Eliquis, appropriately dosed, no missed doses     S/p failed DCCV   Tikosyn load in progress EKG is reviewed with Dr. Lovena Le, QTc has gotten a little long, will reduce  dose this AM to 236mcg K+ 4.1 Mag 2.3 Creat 0.88, stable  He is in Afib 110's this AM, hopefully will regain SR after his dose this AM   2. COPD     continue home tx/inhalers  3. Strong suspicion of sleep apnea      he has testing scheduled  For questions or updates, please contact [place] Please consult [email or web address] for contact info under   Signed, Baldwin Jamaica, PA-C  06/27/2018, 8:38 AM    EP Attending Patient seen and examined. Agree with the findings as noted above. The patient has reverted back to atrial fib this a.m. His QT has prolonged. He will have his dose reduced to 250 mcg bid. I am a bit skeptical about the use of dofetilide for long term maintenance of NSR. Please do not hold dofetilide. His QT should correct with a reduction in his dose.  Carleene Overlie  Deneka Greenwalt,M.D.

## 2018-06-28 LAB — BASIC METABOLIC PANEL
Anion gap: 8 (ref 5–15)
BUN: 12 mg/dL (ref 8–23)
CO2: 22 mmol/L (ref 22–32)
Calcium: 8.7 mg/dL — ABNORMAL LOW (ref 8.9–10.3)
Chloride: 107 mmol/L (ref 98–111)
Creatinine, Ser: 0.88 mg/dL (ref 0.61–1.24)
GFR calc Af Amer: >60 mL/min (ref >60)
GFR calc non Af Amer: >60 mL/min (ref >60)
Glucose, Bld: 103 mg/dL — ABNORMAL HIGH (ref 70–99)
Potassium: 4 mmol/L (ref 3.5–5.1)
Sodium: 137 mmol/L (ref 135–145)

## 2018-06-28 LAB — MAGNESIUM: Magnesium: 2.3 mg/dL (ref 1.7–2.4)

## 2018-06-28 MED ORDER — AMIODARONE HCL 400 MG PO TABS: 400.0000 mg | ORAL_TABLET | Freq: Two times a day (BID) | ORAL | 0 refills | Status: DC

## 2018-06-28 MED ORDER — AMIODARONE HCL 400 MG PO TABS: 400.0000 mg | ORAL_TABLET | Freq: Every day | ORAL | 0 refills | Status: DC

## 2018-06-28 MED FILL — AMIODARONE HCL 200 MG TAB: 200 | 70 days supply | Qty: 70 | Fill #0

## 2018-06-28 NOTE — Discharge Summary (Addendum)
DISCHARGE SUMMARY    Patient ID: Mitchell Parrish,  MRN: [id number], DOB/AGE: Jan 24, 1956 63 y.o.  Admit date: 06/25/2018 Discharge date: 06/28/2018  Primary Care Physician: [name], DO Primary Cardiologist/Electrophysiologist: Dr. Lovena Le  Primary Discharge Diagnosis:  1. persistent AFib     Failed DCCV     Failed Tikosyn load  Secondary Discharge Diagnosis:  1. HTN 2. Mild CM     Suspect tachy-mediated     Compensated 3. Obesity 4. COPD     Former smoker 5. Suspect OSA      Pending home sleep study  Allergies  Allergen Reactions   Lodine [Etodolac] Hives   Percocet [Oxycodone-Acetaminophen] Swelling     Procedures This Admission:  1.  06/25/2018, DCCV (unsuccessful 2.  Tikosyn load       QT prolongation w/538mcg       Return to AFib   Brief HPI: Mitchell Parrish is a 63 y.o. male initially found with new AFib w/RVR last month, with CHF, preserved  LVEF, given asymptomatic with fluid management, and COVID restrictions, he was started on a/c, planned for out patient follow up.  He was found again in AFib and f/u echo noted reduction in LVEF and planned for DCCV. He came in 06/25/2018 for DCCV unfortunately failed and admitted for Tikosyn.  Hospital Course:  The patient was admitted Tikosyn initiated.  His electrolytes and renal function monitored throughout his stay remained stable.  He had conversion to SR with 1st dose of 579mcg of Tikosyn, though unfortunately also reverted back to AFib at same dose.  He developed QT prolongation requiring 283mcg dosing and given no further SR was observed felt to have failed the drug.  Dr. Lovena Le discussed next step recommendations.  Plan for amiodarone to start tomorrow 06/29/2018 (he did not get any Tikosyn today), initially 400mg  BID for 5days then 400mg  dailly until is f/u with Dr. Lovena Le.  If still in AF, plan for DCCV on drug with thoughts towards perhaps AFib ablation.  In the meantime, patient will  complete his sleep study and get on CPAP/treatment if indicated and work on losing some weight.  While here, the patient found a tick oon him (attached) at his groin.  Not engorged, and "barely on me", the groin was erythematous and slightly swollen, not painful and he is afebrile.  He suspects was in his clothes that were brought in from home, reporting he tends to find ticks quite a bit, often working in his yard.  Also reports getting the same local reaction "every time".  He was given instruction to monitor the site for any spread of redness, increased swelling, development of pain, fever, headaches, symptoms to be watchful of and to seek attention for. The patient was examined by Dr. Lovena Le and considered stable for discharge to home.    Physical Exam: Vitals:   06/27/18 1300 06/27/18 1923 06/27/18 2247 06/28/18 0650  BP: 108/79  98/74 114/77  Pulse: (!) 110  96 78  Resp:   16 20  Temp: 98.2 F (36.8 C)  97.9 F (36.6 C) 98.1 F (36.7 C)  TempSrc: Oral  Oral Oral  SpO2: 96% 96% 95% 94%  Weight:    111.9 kg  Height:        GEN- The patient is well appearing, alert and oriented x 3 today.   HEENT: normocephalic, atraumatic; sclera clear, conjunctiva pink; hearing intact; oropharynx clear; neck supple, no JVP Lungs- CTA b/l, normal work of breathing.  No wheezes, rales, rhonchi Heart- RRR, no murmurs, rubs or gallops, PMI not laterally displaced GI- soft, non-tender, non-distended Extremities- no clubbing, cyanosis, or edema MS- no significant deformity or atrophy Skin- warm and dry, area of erythema L groin, non-tender Psych- euthymic mood, full affect Neuro- no gross deficits   Labs:   Lab Results  Component Value Date   WBC 8.3 06/19/2018   HGB 15.9 06/19/2018   HCT 46.2 06/19/2018   MCV 92.8 06/19/2018   PLT 189.0 06/19/2018    Recent Labs  Lab 06/28/18 0330  NA 137  K 4.0  CL 107  CO2 22  BUN 12  CREATININE 0.88  CALCIUM 8.7*  GLUCOSE 103*    Discharge  Medications:  Allergies as of 06/28/2018      Reactions   Lodine [etodolac] Hives   Percocet [oxycodone-acetaminophen] Swelling      Medication List    TAKE these medications   amiodarone 400 MG tablet Commonly known as:  Pacerone Take 1 tablet (400 mg total) by mouth 2 (two) times daily for 5 days. Start taking on:  Jun 29, 2018   amiodarone 400 MG tablet Commonly known as:  Pacerone Take 1 tablet (400 mg total) by mouth daily. Start taking on:  Jul 04, 2018   apixaban 5 MG Tabs tablet Commonly known as:  ELIQUIS Take 1 tablet (5 mg total) by mouth 2 (two) times daily.   diltiazem 240 MG 24 hr capsule Commonly known as:  CARDIZEM CD Take 1 capsule (240 mg total) by mouth daily.   fluticasone 50 MCG/ACT nasal spray Commonly known as:  FLONASE Place 2 sprays into both nostrils as needed for allergies or rhinitis.   furosemide 40 MG tablet Commonly known as:  LASIX Take 1 tablet (40 mg total) by mouth 2 (two) times daily. What changed:  additional instructions   metoprolol tartrate 50 MG tablet Commonly known as:  LOPRESSOR Take 1 tablet (50 mg total) by mouth 2 (two) times daily.   OVER THE COUNTER MEDICATION Take 0.05 drops by mouth daily at 2 PM. CBD oil 240 mg on the bottle   ProAir HFA 108 (90 Base) MCG/ACT inhaler Generic drug:  albuterol INHALE 2 PUFFS INTO THE LUNGS EVERY 4 HOURS AS NEEDED FOR WHEEZING ORSHORTNESS OF BREATH What changed:  See the new instructions.   Symbicort 80-4.5 MCG/ACT inhaler Generic drug:  budesonide-formoterol INHALE 2 PUFFS INTO THE LUNGS TWICE DAILY What changed:  when to take this   umeclidinium bromide 62.5 MCG/INH Aepb Commonly known as:  INCRUSE ELLIPTA Inhale 1 puff into the lungs daily.       Disposition: Home Discharge Instructions    Diet - low sodium heart healthy   Complete by:  As directed    Increase activity slowly   Complete by:  As directed      Follow-up Information    Evans Lance, MD Follow up.    Specialty:  Cardiology Why:  07/31/2018 @ 9:30AM, this is scheduled as an in-clinic visit Contact information: [address] [address] [phone]           Duration of Discharge Encounter: Greater than 30 minutes including physician time.  Venetia Night, PA-C 06/28/2018 8:43 AM  EP Attending  Patient seen and examined. Agree with the findings as above. The patient is still in atrial fib and QT is borderline despite reducing the dose down to 250 q12. At this point, we will consider dofetilide a  failure. He will be discharged home and start amiodarone 400 bid for 5 days the 400 mg daily. I will see him back in 3-4 weeks and if he is still in atrial fib, we will schedule a repeat DCCV. Ultimately he will need treatment of his sleep apnea, weight loss, ablation and the hopefully a gradual weaning down on his amiodarone. He will go home on lasix daily along with a beta blocker and a calcium channel blocker for rate control. I will hold off on after load reduction until we know that he will not have improvement of his LV function with rate/rhythm control. He is asymptomatic with reagard to palpitations.   [name].D.

## 2018-06-28 NOTE — Discharge Instructions (Signed)
Electrical Cardioversion, Care After °This sheet gives you information about how to care for yourself after your procedure. Your health care provider may also give you more specific instructions. If you have problems or questions, contact your health care provider. °What can I expect after the procedure? °After the procedure, it is common to have: °· Some redness on the skin where the shocks were given. °Follow these instructions at home: ° °· Do not drive for 24 hours if you were given a medicine to help you relax (sedative). °· Take over-the-counter and prescription medicines only as told by your health care provider. °· Ask your health care provider how to check your pulse. Check it often. °· Rest for 48 hours after the procedure or as told by your health care provider. °· Avoid or limit your caffeine use as told by your health care provider. °Contact a health care provider if: °· You feel like your heart is beating too quickly or your pulse is not regular. °· You have a serious muscle cramp that does not go away. °Get help right away if: ° °· You have discomfort in your chest. °· You are dizzy or you feel faint. °· You have trouble breathing or you are short of breath. °· Your speech is slurred. °· You have trouble moving an arm or leg on one side of your body. °· Your fingers or toes turn cold or blue. °This information is not intended to replace advice given to you by your health care provider. Make sure you discuss any questions you have with your health care provider. °Document Released: 11/14/2012 Document Revised: 08/28/2015 Document Reviewed: 07/31/2015 °Elsevier Interactive Patient Education © 2019 [employer] Inc. ° °

## 2018-06-29 ENCOUNTER — Other Ambulatory Visit: Payer: Self-pay | Admitting: Internal Medicine

## 2018-06-29 MED ORDER — APIXABAN 5 MG PO TABS: 5.0000 mg | ORAL_TABLET | Freq: Two times a day (BID) | ORAL | 6 refills | Status: DC

## 2018-06-29 MED ORDER — DILTIAZEM HCL ER COATED BEADS 240 MG PO CP24: 240.0000 mg | ORAL_CAPSULE | Freq: Every day | ORAL | 3 refills | Status: DC

## 2018-06-29 MED ORDER — FUROSEMIDE 40 MG PO TABS: 40.0000 mg | ORAL_TABLET | Freq: Two times a day (BID) | ORAL | 3 refills | Status: DC

## 2018-06-29 MED ORDER — METOPROLOL TARTRATE 50 MG PO TABS: 50.0000 mg | ORAL_TABLET | Freq: Two times a day (BID) | ORAL | 3 refills | Status: DC

## 2018-06-29 MED ORDER — AMIODARONE HCL 400 MG PO TABS: 400.0000 mg | ORAL_TABLET | Freq: Every day | ORAL | 3 refills | Status: DC

## 2018-06-29 NOTE — Telephone Encounter (Signed)
Pt last saw Dr Lovena Le 06/13/18, last labs 06/28/18 Creat 0.88, age 63, weight 113.4kg, based  On specified criteria pt is on appropriate dosage of Eliquis 5mg  BID.  Will refill rx.

## 2018-06-29 NOTE — Telephone Encounter (Signed)
Pt needing a 90 day supply of his Eliquis sent to his requested pharmacy, [facility]. Please address

## 2018-07-06 ENCOUNTER — Ambulatory Visit: Payer: BLUE CROSS/BLUE SHIELD

## 2018-07-06 ENCOUNTER — Other Ambulatory Visit: Payer: Self-pay

## 2018-07-06 DIAGNOSIS — G4733 Obstructive sleep apnea (adult) (pediatric): Secondary | ICD-10-CM

## 2018-07-07 DIAGNOSIS — J9601 Acute respiratory failure with hypoxia: Secondary | ICD-10-CM

## 2018-07-11 DIAGNOSIS — G4733 Obstructive sleep apnea (adult) (pediatric): Secondary | ICD-10-CM | POA: Diagnosis not present

## 2018-07-14 ENCOUNTER — Other Ambulatory Visit: Payer: Self-pay | Admitting: Family Medicine

## 2018-07-14 DIAGNOSIS — Z720 Tobacco use: Secondary | ICD-10-CM

## 2018-07-18 ENCOUNTER — Telehealth: Payer: Self-pay | Admitting: Family Medicine

## 2018-07-18 NOTE — Telephone Encounter (Signed)
Copied from [place] [id number]. Topic: General - Other >> Jul 17, 2018  4:01 PM Keene Breath wrote: Reason for CRM: Patient called and stated he is returning a call from someone who did not leave a message.  He stated he is not sure if it is about his medication.  Please advise and call patient back at [phone]  Unable to get the patient.

## 2018-07-24 ENCOUNTER — Telehealth: Payer: Self-pay

## 2018-07-24 ENCOUNTER — Telehealth: Payer: BLUE CROSS/BLUE SHIELD | Admitting: Internal Medicine

## 2018-07-24 NOTE — Telephone Encounter (Signed)

## 2018-07-25 ENCOUNTER — Telehealth: Payer: Self-pay | Admitting: Internal Medicine

## 2018-07-25 DIAGNOSIS — G4733 Obstructive sleep apnea (adult) (pediatric): Secondary | ICD-10-CM

## 2018-07-25 NOTE — Telephone Encounter (Signed)
Pt had sleep study performed 5/30 and is calling requesting to know the results. Dr. Annamaria Boots, please advise on this for pt. Thank you!

## 2018-07-25 NOTE — Telephone Encounter (Signed)
Sleep study confirmed moderate obstructive sleep apnea, averaging 26.8 apneas/ hour, with drops in blood oxygen level.  I recommend we start CPAP.  Order- new DME, new CPAP auto 5-20, mask of choice, humidifier, supplies, Airview/ card  Please check to see that he has a return ov in 31-90 days, per insurance regs

## 2018-07-26 NOTE — Telephone Encounter (Signed)
Spoke with pt. He is aware of home sleep study results. Order for CPAP has been placed. Pt will call back once he receives his CPAP to make his follow up appointment. Nothing further was needed.

## 2018-07-27 ENCOUNTER — Telehealth: Payer: Self-pay | Admitting: Internal Medicine

## 2018-07-27 NOTE — Telephone Encounter (Signed)
Outpatient Medication Detail   Disp Refills Start End   amiodarone (PACERONE) 400 MG tablet 90 tablet 3 06/29/2018    Sig - Route: Take 1 tablet (400 mg total) by mouth daily. - Oral   Sent to pharmacy as: amiodarone (PACERONE) 400 MG tablet   E-Prescribing Status: Receipt confirmed by pharmacy (06/29/2018 11:26 AM EDT)   Pharmacy  [facility], [address]

## 2018-07-27 NOTE — Telephone Encounter (Signed)
New message    *STAT* If patient is at the pharmacy, call can be transferred to refill team.   1. Which medications need to be refilled? (please list name of each medication and dose if known)amiodarone (PACERONE) 400 MG tablet  2. Which pharmacy/location (including street and city if local pharmacy) is medication to be sent to?[facility] - [place].  3. Do they need a 30 day or 90 day supply? [place]

## 2018-07-27 NOTE — Telephone Encounter (Signed)
Spoke with patient and made him aware that this had been sent in as below. He was not aware that we had already sent this in for him. He will call the pharmacy and request that they get this refill ready for him and will call us back if he has any further questions or concerns. He verbalized his understanding and appreciation for my call.

## 2018-07-31 ENCOUNTER — Ambulatory Visit (INDEPENDENT_AMBULATORY_CARE_PROVIDER_SITE_OTHER): Payer: BC Managed Care – PPO | Admitting: Internal Medicine

## 2018-07-31 ENCOUNTER — Encounter: Payer: Self-pay | Admitting: Internal Medicine

## 2018-07-31 ENCOUNTER — Other Ambulatory Visit: Payer: Self-pay

## 2018-07-31 VITALS — BP 116/74 | HR 54 | Ht 70.0 in | Wt 249.8 lb

## 2018-07-31 DIAGNOSIS — I4891 Unspecified atrial fibrillation: Secondary | ICD-10-CM | POA: Diagnosis not present

## 2018-07-31 DIAGNOSIS — I5032 Chronic diastolic (congestive) heart failure: Secondary | ICD-10-CM

## 2018-07-31 DIAGNOSIS — G4733 Obstructive sleep apnea (adult) (pediatric): Secondary | ICD-10-CM | POA: Diagnosis not present

## 2018-07-31 MED ORDER — AMIODARONE HCL 200 MG PO TABS: ORAL_TABLET | ORAL | 3 refills | Status: DC

## 2018-07-31 MED ORDER — METOPROLOL TARTRATE 25 MG PO TABS: 25.0000 mg | ORAL_TABLET | Freq: Two times a day (BID) | ORAL | 3 refills | Status: DC

## 2018-07-31 NOTE — Progress Notes (Signed)
HPI Mitchell Parrish returns today for followup of his atrial fib. He is a pleasant 63 yo man with a tachy induced CM who failed flecainide, dofetilide and was placed on amiodarone. His rates were very hard to control. He has reverted back to NSR on amiodarone. He feels better. He was diagnosed with sleep apnea and is about to start CPAP. He denies chest pain or syncope. His dyspnea is improved. Allergies  Allergen Reactions  . Lodine [Etodolac] Hives  . Percocet [Oxycodone-Acetaminophen] Swelling     Current Outpatient Medications  Medication Sig Dispense Refill  . amiodarone (PACERONE) 400 MG tablet Take 1 tablet (400 mg total) by mouth daily. 90 tablet 3  . apixaban (ELIQUIS) 5 MG TABS tablet Take 1 tablet (5 mg total) by mouth 2 (two) times daily. 60 tablet 6  . diltiazem (CARDIZEM CD) 240 MG 24 hr capsule Take 1 capsule (240 mg total) by mouth daily. 90 capsule 3  . fluticasone (FLONASE) 50 MCG/ACT nasal spray Place 2 sprays into both nostrils as needed for allergies or rhinitis.    . furosemide (LASIX) 40 MG tablet Take 1 tablet (40 mg total) by mouth 2 (two) times daily. 180 tablet 3  . metoprolol tartrate (LOPRESSOR) 50 MG tablet Take 1 tablet (50 mg total) by mouth 2 (two) times daily. 180 tablet 3  . OVER THE COUNTER MEDICATION Take 0.05 drops by mouth daily at 2 PM. CBD oil 240 mg on the bottle     . PROAIR HFA 108 (90 Base) MCG/ACT inhaler INHALE 2 PUFFS INTO THE LUNGS EVERY 4 HOURS AS NEEDED FOR WHEEZING ORSHORTNESS OF BREATH 17 g 0  . SYMBICORT 80-4.5 MCG/ACT inhaler INHALE 2 PUFFS INTO THE LUNGS TWICE DAILY 10.2 g 1  . umeclidinium bromide (INCRUSE ELLIPTA) 62.5 MCG/INH AEPB Inhale 1 puff into the lungs daily. 30 each 5   No current facility-administered medications for this visit.      Past Medical History:  Diagnosis Date  . Acute diastolic CHF (congestive heart failure) ([place]) 05/29/2018  . Arthritis    left hip  . Hyperlipidemia   . Hypertension     ROS:   All systems reviewed and negative except as noted in the HPI.   Past Surgical History:  Procedure Laterality Date  . BACK SURGERY    . CARDIOVERSION N/A 06/25/2018   Procedure: CARDIOVERSION (CATH LAB);  Surgeon: Evans Lance, MD;  Location: [facility];  Service: Cardiovascular;  Laterality: N/A;  . HERNIA REPAIR    . SPINE SURGERY     cervical fusion c6, c7 (1994), and c3,4,5,6 (2004)     Family History  Problem Relation Age of Onset  . Diabetes Mother   . Lung cancer Mother 50  . Diabetes Father   . Heart disease Father   . Colon cancer Father 49       rectal cancer     Social History   Socioeconomic History  . Marital status: Married    Spouse name: Not on file  . Number of children: 4  . Years of education: Not on file  . Highest education level: Not on file  Occupational History  . Occupation: TRUCK DRIVER    Employer: [employer]  Social Needs  . Financial resource strain: Not on file  . Food insecurity    Worry: Not on file    Inability: Not on file  . Transportation needs    Medical: Not on file  Non-medical: Not on file  Tobacco Use  . Smoking status: Former Smoker    Packs/day: 1.00    Years: 45.00    Pack years: 45.00    Types: E-cigarettes    Quit date: 05/31/2018    Years since quitting: 0.1  . Smokeless tobacco: Never Used  Substance and Sexual Activity  . Alcohol use: Yes    Comment: 2-3 drinks/ day  . Drug use: No  . Sexual activity: Yes  Lifestyle  . Physical activity    Days per week: Not on file    Minutes per session: Not on file  . Stress: Not on file  Relationships  . Social Herbalist on phone: Not on file    Gets together: Not on file    Attends religious service: Not on file    Active member of club or organization: Not on file    Attends meetings of clubs or organizations: Not on file    Relationship status: Not on file  . Intimate partner violence    Fear of current or ex partner: Not on file     Emotionally abused: Not on file    Physically abused: Not on file    Forced sexual activity: Not on file  Other Topics Concern  . Not on file  Social History Narrative  . Not on file     BP 116/74   Pulse (!) 54   Ht 5\' 10"  (1.778 m)   Wt 249 lb 12.8 oz (113.3 kg)   SpO2 96%   BMI 35.84 kg/m   Physical Exam:  Well appearing NAD HEENT: Unremarkable Neck:  No JVD, no thyromegally Lymphatics:  No adenopathy Back:  No CVA tenderness Lungs:  No increased work of breathing HEART:  Regular brady rhythm Abd:  soft, positive bowel sounds, no organomegally, no rebound, no guarding Ext:  2 plus pulses, no edema, no cyanosis, no clubbing Skin:  No rashes no nodules Neuro:  CN II through XII intact, motor grossly intact  EKG - sinus brady  Assess/Plan: 1. Persistent atrial fib - he has reverted back to NSR. He will continue amio but we will reduce the dose to 300 mg daily in divided doses. I would expect a referral for catheter ablation of his atrial fib in 3 months or so. 2. Sinus bradycardia - his rate is a little slow and I have asked him to reduce metoprolol to 25 mg twice daily. 3. HTN - his bp is improved. 4. Tachy induced CM - he will have a repeat echo in about 3 months.   [name].D.

## 2018-07-31 NOTE — Patient Instructions (Addendum)
Medication Instructions:  Your physician has recommended you make the following change in your medication:   1.  Reduce your amiodarone 200 mg---Take one tablet (200 mg) in the morning and 1/2 tablet (100 mg) at night.  2.  Reduce your metoprolol tartrate 50 mg--Take 1/2 tablet (25 mg) by mouth twice a day.   When this prescription is gone, your next prescription will be for metoprolol 25 mg tablets  Labwork: None ordered.  Testing/Procedures: None ordered.  Follow-Up: Your physician wants you to follow-up in: 2-3 months with Dr. Lovena Le.      Any Other Special Instructions Will Be Listed Below (If Applicable).  If you need a refill on your cardiac medications before your next appointment, please call your pharmacy.

## 2018-08-14 ENCOUNTER — Ambulatory Visit (INDEPENDENT_AMBULATORY_CARE_PROVIDER_SITE_OTHER): Payer: BC Managed Care – PPO | Admitting: Family Medicine

## 2018-08-14 ENCOUNTER — Telehealth: Payer: Self-pay | Admitting: Family Medicine

## 2018-08-14 ENCOUNTER — Other Ambulatory Visit: Payer: Self-pay

## 2018-08-14 ENCOUNTER — Telehealth: Payer: Self-pay | Admitting: Internal Medicine

## 2018-08-14 ENCOUNTER — Ambulatory Visit: Payer: Self-pay | Admitting: *Deleted

## 2018-08-14 ENCOUNTER — Encounter: Payer: Self-pay | Admitting: Family Medicine

## 2018-08-14 DIAGNOSIS — Z20822 Contact with and (suspected) exposure to covid-19: Secondary | ICD-10-CM

## 2018-08-14 DIAGNOSIS — Z20828 Contact with and (suspected) exposure to other viral communicable diseases: Secondary | ICD-10-CM | POA: Diagnosis not present

## 2018-08-14 NOTE — Telephone Encounter (Signed)
New Message:    Patient stating that his tested positive for covid-19 and is asking should he be tested. Please call patient back.

## 2018-08-14 NOTE — Telephone Encounter (Signed)
Call received from Pt.  Per Pt one of his coworkers tested positive for covid.  He is wondering if he should be tested.  Asked if he had any direction from his employer?  He states no, they have not been forthcoming with information or advisement.  Advised Pt to call his PCP and see if he recommends he get test.  Pt agrees and will call PCP.

## 2018-08-14 NOTE — Telephone Encounter (Signed)
Pt called stating that he was in contact with his boss who tested posititive for COVID; his last contact was 6/31/2020; the pt denies SOB, fever, cough; the pt says that he works mainly outside, but at work social distancing in practiced along with the use of masks; the pt says that he and many other employees are In and out of his boss's office, and they handle the same paperwork; recommendations made per nurse triage protocol; the pt verbalized understanding; he can be contacted at [phone], and a message can be left on the voicemail; the pt sees Dr Nani Ravens; will route to office for notification.   Reason for Disposition . [1] COVID-19 EXPOSURE (Close Contact) AND [2] within last 14 days BUT [3] NO symptoms  Answer Assessment - Initial Assessment Questions 1. CLOSE CONTACT: "Who is the person with the confirmed or suspected COVID-19 infection that you were exposed to?"     Pt's boss 2. PLACE of CONTACT: "Where were you when you were exposed to COVID-19?" (e.g., home, school, medical waiting room; which city?)     work 3. TYPE of CONTACT: "How much contact was there?" (e.g., sitting next to, live in same house, work in same office, same building)     office 4. DURATION of CONTACT: "How long were you in contact with the COVID-19 patient?" (e.g., a few seconds, passed by person, a few minutes, live with the patient)     A few minutes 5. DATE of CONTACT: "When did you have contact with a COVID-19 patient?" (e.g., how many days ago)    6/31/2020 6. TRAVEL: "Have you traveled out of the country recently?" If so, "When and where?"     * Also ask about out-of-state travel, since the CDC has identified some high-risk cities for community spread in the [place].     * Note: Travel becomes less relevant if there is widespread community transmission where the patient lives.     no 7. COMMUNITY SPREAD: "Are there lots of cases of COVID-19 (community spread) where you live?" (See public health department  website, if unsure)       Major community spread 8. SYMPTOMS: "Do you have any symptoms?" (e.g., fever, cough, breathing difficulty)    no 9. PREGNANCY OR POSTPARTUM: "Is there any chance you are pregnant?" "When was your last menstrual period?" "Did you deliver in the last 2 weeks?"    no 10. HIGH RISK: "Do you have any heart or lung problems? Do you have a weak immune system?" (e.g., CHF, COPD, asthma, HIV positive, chemotherapy, renal failure, diabetes mellitus, sickle cell anemia)       Afib, COPD  Protocols used: CORONAVIRUS (COVID-19) EXPOSURE-A-AH

## 2018-08-14 NOTE — Telephone Encounter (Signed)
Needs virtual visit 

## 2018-08-14 NOTE — Progress Notes (Signed)
Chief Complaint  Patient presents with  . Covid exposure    No Symptoms    Subjective: Patient is a 63 y.o. male here for exposure to COVID-19. Due to COVID-19 pandemic, we are interacting via web portal for an electronic face-to-face visit. I verified patient's ID using 2 identifiers. Patient agreed to proceed with visit via this method. Patient is at home, I am at office. Patient and I are present for visit.   Pt has been working, in close prox to boss. Boss dx'd w COVID. Pt currently has no s/s's. Exposed around 7 d ago.   ROS: Const: no fevers Lungs: Denies SOB   Past Medical History:  Diagnosis Date  . Acute diastolic CHF (congestive heart failure) ([place]) 05/29/2018  . Arthritis    left hip  . Hyperlipidemia   . Hypertension     Objective: No conversational dyspnea Age appropriate judgment and insight Nml affect and mood  Assessment and Plan: Exposure to Covid-19 Virus - Plan: will have him tested. Cont to social distance and wear mask in public. If test +, will discuss management.   F.u as originally scheduled otherwise.  The patient voiced understanding and agreement to the plan.  [place], DO 08/14/18  3:47 PM

## 2018-08-14 NOTE — Telephone Encounter (Signed)
Please call and schedule Testing

## 2018-08-16 ENCOUNTER — Other Ambulatory Visit: Payer: BC Managed Care – PPO

## 2018-08-16 ENCOUNTER — Telehealth: Payer: Self-pay | Admitting: *Deleted

## 2018-08-16 DIAGNOSIS — Z20822 Contact with and (suspected) exposure to covid-19: Secondary | ICD-10-CM

## 2018-08-16 DIAGNOSIS — R6889 Other general symptoms and signs: Secondary | ICD-10-CM | POA: Diagnosis not present

## 2018-08-16 NOTE — Telephone Encounter (Signed)
Pt called to be scheduled for COVID testing per Dr Nani Ravens; pt accepted appointment at [facility] site 08/16/2018 at [place]; pt given address, location, and instructions that he and all occupants of his vehicle should wear masks; also explained to pt that results can take 5-7 business days and are available for review in MyChart; he verbalized uderstanding; link for MyChart sent to [phone].; orders placed per protocol.

## 2018-08-21 ENCOUNTER — Other Ambulatory Visit: Payer: Self-pay | Admitting: Family Medicine

## 2018-08-21 ENCOUNTER — Telehealth: Payer: Self-pay | Admitting: Family Medicine

## 2018-08-21 DIAGNOSIS — Z129 Encounter for screening for malignant neoplasm, site unspecified: Secondary | ICD-10-CM

## 2018-08-21 LAB — NOVEL CORONAVIRUS, NAA: SARS-CoV-2, NAA: NOT DETECTED

## 2018-08-21 NOTE — Progress Notes (Signed)
GI referral

## 2018-08-21 NOTE — Telephone Encounter (Signed)
OK to refer to GI. Ty.

## 2018-08-21 NOTE — Telephone Encounter (Signed)
Patient informed of results... He also stated he is overdue for colonoscopy.

## 2018-08-21 NOTE — Telephone Encounter (Signed)
Pt received negative covid test results

## 2018-08-30 DIAGNOSIS — G4733 Obstructive sleep apnea (adult) (pediatric): Secondary | ICD-10-CM | POA: Diagnosis not present

## 2018-09-21 ENCOUNTER — Ambulatory Visit: Payer: BLUE CROSS/BLUE SHIELD | Admitting: Pulmonary Disease

## 2018-09-22 ENCOUNTER — Other Ambulatory Visit: Payer: Self-pay | Admitting: Family Medicine

## 2018-09-22 DIAGNOSIS — Z720 Tobacco use: Secondary | ICD-10-CM

## 2018-09-25 ENCOUNTER — Encounter: Payer: Self-pay | Admitting: Gastroenterology

## 2018-09-30 DIAGNOSIS — G4733 Obstructive sleep apnea (adult) (pediatric): Secondary | ICD-10-CM | POA: Diagnosis not present

## 2018-10-08 ENCOUNTER — Encounter: Payer: Self-pay | Admitting: Internal Medicine

## 2018-10-08 ENCOUNTER — Ambulatory Visit (INDEPENDENT_AMBULATORY_CARE_PROVIDER_SITE_OTHER): Payer: BC Managed Care – PPO | Admitting: Internal Medicine

## 2018-10-08 ENCOUNTER — Other Ambulatory Visit: Payer: Self-pay

## 2018-10-08 VITALS — BP 120/68 | HR 54 | Ht 70.0 in | Wt 251.8 lb

## 2018-10-08 DIAGNOSIS — I10 Essential (primary) hypertension: Secondary | ICD-10-CM | POA: Diagnosis not present

## 2018-10-08 DIAGNOSIS — I4819 Other persistent atrial fibrillation: Secondary | ICD-10-CM | POA: Diagnosis not present

## 2018-10-08 DIAGNOSIS — I428 Other cardiomyopathies: Secondary | ICD-10-CM

## 2018-10-08 MED ORDER — METOPROLOL SUCCINATE ER 25 MG PO TB24: 25.0000 mg | ORAL_TABLET | Freq: Every day | ORAL | 3 refills | Status: DC

## 2018-10-08 NOTE — Patient Instructions (Addendum)
Medication Instructions:  Your physician has recommended you make the following change in your medication:   1. Stop taking metoprolol tartrate  2.  Start taking TOPROL XL (metoprolol succinate) 25 mg-  One tablet by mouth daily at bedtime  3..  Continue amiodarone  4.  Continue diltiazem-prescription given for diltiazem ER 120 mg daily to fill when current medication is gone  Labwork: None ordered.  Testing/Procedures: Your physician has requested that you have an echocardiogram. Echocardiography is a painless test that uses sound waves to create images of your heart. It provides your doctor with information about the size and shape of your heart and how well your heart's chambers and valves are working. This procedure takes approximately one hour. There are no restrictions for this procedure.  Please schedule ECHO for same day as return visit in 3 months.   Follow-Up: Your physician wants you to follow-up in: 3 months with Dr. Lovena Le.       Any Other Special Instructions Will Be Listed Below (If Applicable).  If you need a refill on your cardiac medications before your next appointment, please call your pharmacy.

## 2018-10-08 NOTE — Progress Notes (Signed)
HPI Mitchell Parrish returns today for followup of his atrial fib. He is a pleasant 63 yo man with a tachy induced CM who failed flecainide, dofetilide and was placed on amiodarone. His rates were very hard to control. He has reverted back to NSR on amiodarone. He feels better. He was diagnosed with sleep apnea and has started CPAP. He denies chest pain or syncope. His dyspnea is improved. His fit bit has not shown any atrial fib. Allergies  Allergen Reactions  . Lodine [Etodolac] Hives  . Percocet [Oxycodone-Acetaminophen] Swelling     Current Outpatient Medications  Medication Sig Dispense Refill  . amiodarone (PACERONE) 200 MG tablet Take one tablet in the AM and 1/2 tablet in the PM 135 tablet 3  . apixaban (ELIQUIS) 5 MG TABS tablet Take 1 tablet (5 mg total) by mouth 2 (two) times daily. 60 tablet 6  . diltiazem (CARDIZEM CD) 240 MG 24 hr capsule Take 1 capsule (240 mg total) by mouth daily. 90 capsule 3  . fluticasone (FLONASE) 50 MCG/ACT nasal spray Place 2 sprays into both nostrils as needed for allergies or rhinitis.    . furosemide (LASIX) 40 MG tablet Take 1 tablet (40 mg total) by mouth 2 (two) times daily. 180 tablet 3  . metoprolol tartrate (LOPRESSOR) 25 MG tablet Take 1 tablet (25 mg total) by mouth 2 (two) times daily. 180 tablet 3  . OVER THE COUNTER MEDICATION Take 0.05 drops by mouth daily at 2 PM. CBD oil 240 mg on the bottle     . PROAIR HFA 108 (90 Base) MCG/ACT inhaler INHALE 2 PUFFS INTO THE LUNGS EVERY 4 HOURS AS NEEDED FOR WHEEZING ORSHORTNESS OF BREATH 17 g 0  . SYMBICORT 80-4.5 MCG/ACT inhaler INHALE 2 PUFFS INTO THE LUNGS TWICE DAILY 10.2 g 1  . umeclidinium bromide (INCRUSE ELLIPTA) 62.5 MCG/INH AEPB Inhale 1 puff into the lungs daily. 30 each 5   No current facility-administered medications for this visit.      Past Medical History:  Diagnosis Date  . Acute diastolic CHF (congestive heart failure) ([place]) 05/29/2018  . Arthritis    left hip  .  Hyperlipidemia   . Hypertension     ROS:   All systems reviewed and negative except as noted in the HPI.   Past Surgical History:  Procedure Laterality Date  . BACK SURGERY    . CARDIOVERSION N/A 06/25/2018   Procedure: CARDIOVERSION (CATH LAB);  Surgeon: Mitchell Lance, MD;  Location: [facility];  Service: Cardiovascular;  Laterality: N/A;  . HERNIA REPAIR    . SPINE SURGERY     cervical fusion c6, c7 (1994), and c3,4,5,6 (2004)     Family History  Problem Relation Age of Onset  . Diabetes Mother   . Lung cancer Mother 29  . Diabetes Father   . Heart disease Father   . Colon cancer Father 75       rectal cancer     Social History   Socioeconomic History  . Marital status: Married    Spouse name: Not on file  . Number of children: 4  . Years of education: Not on file  . Highest education level: Not on file  Occupational History  . Occupation: TRUCK DRIVER    Employer: [employer]  Social Needs  . Financial resource strain: Not on file  . Food insecurity    Worry: Not on file    Inability: Not on file  .  Transportation needs    Medical: Not on file    Non-medical: Not on file  Tobacco Use  . Smoking status: Former Smoker    Packs/day: 1.00    Years: 45.00    Pack years: 45.00    Types: E-cigarettes    Quit date: 05/31/2018    Years since quitting: 0.3  . Smokeless tobacco: Never Used  Substance and Sexual Activity  . Alcohol use: Yes    Comment: 2-3 drinks/ day  . Drug use: No  . Sexual activity: Yes  Lifestyle  . Physical activity    Days per week: Not on file    Minutes per session: Not on file  . Stress: Not on file  Relationships  . Social Herbalist on phone: Not on file    Gets together: Not on file    Attends religious service: Not on file    Active member of club or organization: Not on file    Attends meetings of clubs or organizations: Not on file    Relationship status: Not on file  . Intimate partner violence     Fear of current or ex partner: Not on file    Emotionally abused: Not on file    Physically abused: Not on file    Forced sexual activity: Not on file  Other Topics Concern  . Not on file  Social History Narrative  . Not on file     BP 120/68   Pulse (!) 54   Ht 5\' 10"  (1.778 m)   Wt 251 lb 12.8 oz (114.2 kg)   SpO2 97%   BMI 36.13 kg/m   Physical Exam:  Well appearing NAD 63 yo man, NAD HEENT: Unremarkable Neck:  No JVD, no thyromegally Lymphatics:  No adenopathy Back:  No CVA tenderness Lungs:  Clear with no wheezes HEART:  Regular brady rhythm, no murmurs, no rubs, no clicks Abd:  soft, positive bowel sounds, no organomegally, no rebound, no guarding Ext:  2 plus pulses, no edema, no cyanosis, no clubbing Skin:  No rashes no nodules Neuro:  CN II through XII intact, motor grossly intact  EKG - sinus brady   Assess/Plan: 1. Persistent atrial fib - he is maintaining NSR. He will continue his current dose of amiodarone.  2. HTN - his bp is good. I plan to reduce dose of beta blocker from metoprolol 25 bid to toprol 25 daily, taken at night.  3. Sleep apnea - he is taking CPAP. 4. Obesity - he has not had much luck with weight loss. I encouraged him to lose weight.  Mitchell Parrish.D.

## 2018-10-30 ENCOUNTER — Telehealth: Payer: Self-pay

## 2018-10-30 ENCOUNTER — Encounter: Payer: Self-pay | Admitting: Gastroenterology

## 2018-10-30 ENCOUNTER — Ambulatory Visit (INDEPENDENT_AMBULATORY_CARE_PROVIDER_SITE_OTHER): Payer: BC Managed Care – PPO | Admitting: Gastroenterology

## 2018-10-30 ENCOUNTER — Other Ambulatory Visit: Payer: Self-pay

## 2018-10-30 VITALS — BP 126/72 | HR 64 | Temp 98.2°F | Ht 70.0 in | Wt 253.0 lb

## 2018-10-30 DIAGNOSIS — Z86010 Personal history of colonic polyps: Secondary | ICD-10-CM

## 2018-10-30 DIAGNOSIS — Z7901 Long term (current) use of anticoagulants: Secondary | ICD-10-CM

## 2018-10-30 MED ORDER — NA SULFATE-K SULFATE-MG SULF 17.5-3.13-1.6 GM/177ML PO SOLN: 1.0000 | Freq: Once | ORAL | 0 refills | Status: AC

## 2018-10-30 NOTE — Telephone Encounter (Signed)
[facility] Pre-operative Risk Assessment     Request for surgical clearance:     Endoscopy Procedure  What type of surgery is being performed?     Colonoscopy  When is this surgery scheduled?     11/14/18  What type of clearance is required ?   Pharmacy  Are there any medications that need to be held prior to surgery and how long? Eliquis x 2 days  Practice name and name of physician performing surgery?      [facility]  What is your office phone and fax number?      Phone- [phone]  [phone]  Anesthesia type (None, local, MAC, general) ?       MAC

## 2018-10-30 NOTE — Progress Notes (Signed)
History of Present Illness: This is a 63 year old male referred by [name]* DO for the evaluation of a personal history of adenomatous colon polyps .Colonoscopy in 11/2011 with one small tubular adenoma. He takes Eliquis for afib.  He has no gastrointestinal complaints. Denies weight loss, abdominal pain, constipation, diarrhea, change in stool caliber, melena, hematochezia, nausea, vomiting, dysphagia, reflux symptoms, chest pain.     Allergies  Allergen Reactions  . Lodine [Etodolac] Hives  . Percocet [Oxycodone-Acetaminophen] Swelling   Outpatient Medications Prior to Visit  Medication Sig Dispense Refill  . amiodarone (PACERONE) 200 MG tablet Take one tablet in the AM and 1/2 tablet in the PM 135 tablet 3  . apixaban (ELIQUIS) 5 MG TABS tablet Take 1 tablet (5 mg total) by mouth 2 (two) times daily. 60 tablet 6  . diltiazem (CARDIZEM CD) 240 MG 24 hr capsule Take 1 capsule (240 mg total) by mouth daily. 90 capsule 3  . fluticasone (FLONASE) 50 MCG/ACT nasal spray Place 2 sprays into both nostrils as needed for allergies or rhinitis.    . furosemide (LASIX) 40 MG tablet Take 1 tablet (40 mg total) by mouth 2 (two) times daily. 180 tablet 3  . metoprolol succinate (TOPROL XL) 25 MG 24 hr tablet Take 1 tablet (25 mg total) by mouth at bedtime. 90 tablet 3  . OVER THE COUNTER MEDICATION Take 0.05 drops by mouth daily at 2 PM. CBD oil 240 mg on the bottle     . PROAIR HFA 108 (90 Base) MCG/ACT inhaler INHALE 2 PUFFS INTO THE LUNGS EVERY 4 HOURS AS NEEDED FOR WHEEZING ORSHORTNESS OF BREATH 17 g 0  . SYMBICORT 80-4.5 MCG/ACT inhaler INHALE 2 PUFFS INTO THE LUNGS TWICE DAILY 10.2 g 1  . umeclidinium bromide (INCRUSE ELLIPTA) 62.5 MCG/INH AEPB Inhale 1 puff into the lungs daily. 30 each 5   No facility-administered medications prior to visit.    Past Medical History:  Diagnosis Date  . Acute diastolic CHF (congestive heart failure) ([place]) 05/29/2018  . Arthritis    left  hip  . Hyperlipidemia   . Hypertension   . Tubular adenoma of colon 11/2011   Past Surgical History:  Procedure Laterality Date  . BACK SURGERY    . CARDIOVERSION N/A 06/25/2018   Procedure: CARDIOVERSION (CATH LAB);  Surgeon: Evans Lance, MD;  Location: [facility];  Service: Cardiovascular;  Laterality: N/A;  . HERNIA REPAIR    . SPINE SURGERY     cervical fusion c6, c7 (1994), and c3,4,5,6 (2004)   Social History   Socioeconomic History  . Marital status: Married    Spouse name: Not on file  . Number of children: 4  . Years of education: Not on file  . Highest education level: Not on file  Occupational History  . Occupation: Nurse, mental health: [employer]  Social Needs  . Financial resource strain: Not on file  . Food insecurity    Worry: Not on file    Inability: Not on file  . Transportation needs    Medical: Not on file    Non-medical: Not on file  Tobacco Use  . Smoking status: Former Smoker    Packs/day: 1.00    Years: 45.00    Pack years: 45.00    Types: E-cigarettes    Quit date: 05/31/2018    Years since quitting: 0.4  . Smokeless tobacco: Never Used  Substance and Sexual Activity  . Alcohol  use: Yes    Comment: 2-3 drinks/ day  . Drug use: No  . Sexual activity: Yes  Lifestyle  . Physical activity    Days per week: Not on file    Minutes per session: Not on file  . Stress: Not on file  Relationships  . Social Herbalist on phone: Not on file    Gets together: Not on file    Attends religious service: Not on file    Active member of club or organization: Not on file    Attends meetings of clubs or organizations: Not on file    Relationship status: Not on file  Other Topics Concern  . Not on file  Social History Narrative  . Not on file   Family History  Problem Relation Age of Onset  . Diabetes Mother   . Lung cancer Mother 63  . Diabetes Father   . Heart disease Father   . Colon cancer Father 82       rectal  cancer      Review of Systems: Pertinent positive and negative review of systems were noted in the above HPI section. All other review of systems were otherwise negative.    Physical Exam: General: Well developed, well nourished, no acute distress Head: Normocephalic and atraumatic Eyes:  sclerae anicteric, EOMI Ears: Normal auditory acuity Mouth: No deformity or lesions Neck: Supple, no masses or thyromegaly Lungs: Clear throughout to auscultation Heart: Regular rate and rhythm; no murmurs, rubs or bruits Abdomen: Soft, non tender and non distended. No masses, hepatosplenomegaly or hernias noted. Normal Bowel sounds Rectal: Deferred to colonoscopy Musculoskeletal: Symmetrical with no gross deformities  Skin: No lesions on visible extremities Pulses:  Normal pulses noted Extremities: No clubbing, cyanosis, edema or deformities noted Neurological: Alert oriented x 4, grossly nonfocal Cervical Nodes:  No significant cervical adenopathy Inguinal Nodes: No significant inguinal adenopathy Psychological:  Alert and cooperative. Normal mood and affect   Assessment and Recommendations:  1.  Personal history of adenomatous colon polyps.  He is due for a 7-year interval surveillance colonoscopy.  Schedule colonoscopy. The risks (including bleeding, perforation, infection, missed lesions, medication reactions and possible hospitalization or surgery if complications occur), benefits, and alternatives to colonoscopy with possible biopsy and possible polypectomy were discussed with the patient and they consent to proceed.   2.  Afib maintained on Eliquis. Hold Eliquis 2 days before procedure - will instruct when and how to resume after procedure. Low but real risk of cardiovascular event such as heart attack, stroke, embolism, thrombosis or ischemia/infarct of other organs off Eliquis explained and need to seek urgent help if this occurs. The patient consents to proceed. Will communicate by phone  or EMR with patient's prescribing provider to confirm that holding Eliquis is reasonable in this case.     cc: [name], DO [address] [place],  [place] [id number]

## 2018-10-30 NOTE — Patient Instructions (Signed)
You have been scheduled for an endoscopy and colonoscopy. Please follow the written instructions given to you at your visit today. Please pick up your prep supplies at the pharmacy within the next 1-3 days. If you use inhalers (even only as needed), please bring them with you on the day of your procedure. Your physician has requested that you go to www.startemmi.com and enter the access code given to you at your visit today. This web site gives a general overview about your procedure. However, you should still follow specific instructions given to you by our office regarding your preparation for the procedure.  Thank you for choosing me and [facility].  Malcolm T. Stark, Jr., MD., FACG  

## 2018-10-31 DIAGNOSIS — G4733 Obstructive sleep apnea (adult) (pediatric): Secondary | ICD-10-CM | POA: Diagnosis not present

## 2018-10-31 NOTE — Telephone Encounter (Signed)
Patient informed he can hold Eliquis 2 days prior to his procedure. Patient verbalized understanding. 

## 2018-10-31 NOTE — Telephone Encounter (Signed)
Patient with diagnosis of afib on Eliquis for anticoagulation.    Procedure: Colonoscopy Date of procedure: 11/14/2018  CHADS2-VASc score of  2 (CHF, HTN)  CrCl 110 ml/min  Per office protocol, patient can hold Eliquis for 2 days prior to procedure.

## 2018-10-31 NOTE — Telephone Encounter (Signed)
Clinical pharmacist to review eliquis 

## 2018-11-02 ENCOUNTER — Encounter: Payer: Self-pay | Admitting: Gastroenterology

## 2018-11-13 ENCOUNTER — Telehealth: Payer: Self-pay

## 2018-11-13 NOTE — Telephone Encounter (Signed)
Covid-19 screening questions   Do you now or have you had a fever in the last 14 days? NO   Do you have any respiratory symptoms of shortness of breath or cough now or in the last 14 days? NO  Do you have any family members or close contacts with diagnosed or suspected Covid-19 in the past 14 days? NO  Have you been tested for Covid-19 and found to be positive? NO        

## 2018-11-14 ENCOUNTER — Ambulatory Visit (AMBULATORY_SURGERY_CENTER): Payer: BC Managed Care – PPO | Admitting: Gastroenterology

## 2018-11-14 ENCOUNTER — Other Ambulatory Visit: Payer: Self-pay

## 2018-11-14 ENCOUNTER — Encounter: Payer: Self-pay | Admitting: Gastroenterology

## 2018-11-14 ENCOUNTER — Other Ambulatory Visit: Payer: Self-pay | Admitting: Gastroenterology

## 2018-11-14 VITALS — BP 114/74 | HR 62 | Temp 97.8°F | Resp 13 | Ht 70.0 in | Wt 253.0 lb

## 2018-11-14 DIAGNOSIS — Z1211 Encounter for screening for malignant neoplasm of colon: Secondary | ICD-10-CM | POA: Diagnosis not present

## 2018-11-14 DIAGNOSIS — D123 Benign neoplasm of transverse colon: Secondary | ICD-10-CM

## 2018-11-14 DIAGNOSIS — D120 Benign neoplasm of cecum: Secondary | ICD-10-CM | POA: Diagnosis not present

## 2018-11-14 DIAGNOSIS — Z86010 Personal history of colonic polyps: Secondary | ICD-10-CM | POA: Diagnosis not present

## 2018-11-14 DIAGNOSIS — D122 Benign neoplasm of ascending colon: Secondary | ICD-10-CM | POA: Diagnosis not present

## 2018-11-14 MED ORDER — SODIUM CHLORIDE 0.9 % IV SOLN: 500.0000 mL | Freq: Once | INTRAVENOUS | Status: DC

## 2018-11-14 NOTE — Op Note (Signed)
[place] Patient Name: Mitchell Parrish Procedure Date: 11/14/2018 8:41 AM MRN: [id number] Endoscopist: Ladene Artist , MD Age: 63 Referring MD:  Date of Birth: Jan 02, 1956 Gender: Male Account #: [id number] Procedure:                Colonoscopy Indications:              Surveillance: Personal history of adenomatous                            polyps on last colonoscopy > 5 years ago Medicines:                Monitored Anesthesia Care Procedure:                Pre-Anesthesia Assessment:                           - Prior to the procedure, a History and Physical                            was performed, and patient medications and                            allergies were reviewed. The patient's tolerance of                            previous anesthesia was also reviewed. The risks                            and benefits of the procedure and the sedation                            options and risks were discussed with the patient.                            All questions were answered, and informed consent                            was obtained. Prior Anticoagulants: The patient has                            taken Eliquis (apixaban), last dose was 2 days                            prior to procedure. ASA Grade Assessment: III - A                            patient with severe systemic disease. After                            reviewing the risks and benefits, the patient was                            deemed in satisfactory condition to undergo the  procedure.                           After obtaining informed consent, the colonoscope                            was passed under direct vision. Throughout the                            procedure, the patient's blood pressure, pulse, and                            oxygen saturations were monitored continuously. The                            Colonoscope was introduced through the anus and                    advanced to the the cecum, identified by                            appendiceal orifice and ileocecal valve. The                            ileocecal valve, appendiceal orifice, and rectum                            were photographed. The quality of the bowel                            preparation was good. The colonoscopy was performed                            without difficulty. The patient tolerated the                            procedure well. Scope In: 8:56:30 AM Scope Out: 9:09:58 AM Scope Withdrawal Time: 0 hours 11 minutes 30 seconds  Total Procedure Duration: 0 hours 13 minutes 28 seconds  Findings:                 The perianal and digital rectal examinations were                            normal.                           Four sessile polyps were found in the transverse                            colon (1), ascending colon (1) and cecum (2). The                            polyps were 6 to 8 mm in size. These polyps were  removed with a cold snare. Resection and retrieval                            were complete.                           Internal hemorrhoids were found during                            retroflexion. The hemorrhoids were small and Grade                            I (internal hemorrhoids that do not prolapse).                           The exam was otherwise without abnormality on                            direct and retroflexion views. Complications:            No immediate complications. Estimated blood loss:                            None. Estimated Blood Loss:     Estimated blood loss: none. Impression:               - Four 6 to 8 mm polyps in the transverse colon, in                            the ascending colon and in the cecum, removed with                            a cold snare. Resected and retrieved.                           - Internal hemorrhoids.                           - The examination was  otherwise normal on direct                            and retroflexion views. Recommendation:           - Repeat colonoscopy date to be determined after                            pending pathology results are reviewed for                            surveillance.                           - Resume Eliquis (apixaban) in 2 days at prior                            dose. Refer to managing physician for further  adjustment of therapy.                           - Patient has a contact number available for                            emergencies. The signs and symptoms of potential                            delayed complications were discussed with the                            patient. Return to normal activities tomorrow.                            Written discharge instructions were provided to the                            patient.                           - Resume previous diet.                           - Continue present medications.                           - Await pathology results.                           - No aspirin, ibuprofen, naproxen, or other                            non-steroidal anti-inflammatory drugs for 2 weeks                            after polyp removal. Ladene Artist, MD 11/14/2018 9:13:21 AM This report has been signed electronically.

## 2018-11-14 NOTE — Patient Instructions (Addendum)
Handouts provided on:  Polyps and Hemorrhoids.    Resume Eliquis on 11/16/2018  No aspirin, ibuprofen, naproxen, or other non-steroidal anti-inflammatory drugs for 2 weeks.  Await pathology report which usually takes 10-14 days to receive.  YOU HAD AN ENDOSCOPIC PROCEDURE TODAY AT [facility]:   Refer to the procedure report that was given to you for any specific questions about what was found during the examination.  If the procedure report does not answer your questions, please call your gastroenterologist to clarify.  If you requested that your care partner not be given the details of your procedure findings, then the procedure report has been included in a sealed envelope for you to review at your convenience later.  YOU SHOULD EXPECT: Some feelings of bloating in the abdomen. Passage of more gas than usual.  Walking can help get rid of the air that was put into your GI tract during the procedure and reduce the bloating. If you had a lower endoscopy (such as a colonoscopy or flexible sigmoidoscopy) you may notice spotting of blood in your stool or on the toilet paper. If you underwent a bowel prep for your procedure, you may not have a normal bowel movement for a few days.  Please Note:  You might notice some irritation and congestion in your nose or some drainage.  This is from the oxygen used during your procedure.  There is no need for concern and it should clear up in a day or so.  SYMPTOMS TO REPORT IMMEDIATELY:   Following lower endoscopy (colonoscopy or flexible sigmoidoscopy):  Excessive amounts of blood in the stool  Significant tenderness or worsening of abdominal pains  Swelling of the abdomen that is new, acute  Fever of 100F or higher   For urgent or emergent issues, a gastroenterologist can be reached at any hour by calling [phone].   DIET:  We do recommend a small meal at first, but then you may proceed to your regular diet.  Drink plenty of  fluids but you should avoid alcoholic beverages for 24 hours.  ACTIVITY:  You should plan to take it easy for the rest of today and you should NOT DRIVE or use heavy machinery until tomorrow (because of the sedation medicines used during the test).    FOLLOW UP: Our staff will call the number listed on your records 48-72 hours following your procedure to check on you and address any questions or concerns that you may have regarding the information given to you following your procedure. If we do not reach you, we will leave a message.  We will attempt to reach you two times.  During this call, we will ask if you have developed any symptoms of COVID 19. If you develop any symptoms (ie: fever, flu-like symptoms, shortness of breath, cough etc.) before then, please call [phone].  If you test positive for Covid 19 in the 2 weeks post procedure, please call and report this information to [place].    If any biopsies were taken you will be contacted by phone or by letter within the next 1-3 weeks.  Please call us at [phone] if you have not heard about the biopsies in 3 weeks.    SIGNATURES/CONFIDENTIALITY: You and/or your care partner have signed paperwork which will be entered into your electronic medical record.  These signatures attest to the fact that that the information above on your After Visit Summary has been reviewed and is understood.  Full responsibility of  the confidentiality of this discharge information lies with you and/or your care-partner. 

## 2018-11-14 NOTE — Progress Notes (Signed)
K A. Temp CW v/s

## 2018-11-14 NOTE — Progress Notes (Signed)
Called to room to assist during endoscopic procedure.  Patient ID and intended procedure confirmed with present staff. Received instructions for my participation in the procedure from the performing physician.  

## 2018-11-14 NOTE — Progress Notes (Signed)
Report given to PACU, vss 

## 2018-11-16 ENCOUNTER — Telehealth: Payer: Self-pay

## 2018-11-16 NOTE — Telephone Encounter (Signed)
  Follow up Call-  Call back number 11/14/2018  Post procedure Call Back phone  # [phone]  Permission to leave phone message Yes  Some recent data might be hidden     Patient questions:  Do you have a fever, pain , or abdominal swelling? Yes.   Pain Score  0 *  Have you tolerated food without any problems? Yes.    Have you been able to return to your normal activities? Yes.    Do you have any questions about your discharge instructions: Diet   No. Medications  No. Follow up visit  No.  Do you have questions or concerns about your Care? No.  Actions: * If pain score is 4 or above: No action needed, pain <4.  1. Have you developed a fever since your procedure? No   2.   Have you had an respiratory symptoms (SOB or cough) since your procedure? n0  3.   Have you tested positive for COVID 19 since your procedure no  4.   Have you had any family members/close contacts diagnosed with the COVID 19 since your procedure?  no   If yes to any of these questions please route to Joylene John, RN and Alphonsa Gin, Therapist, sports.

## 2018-11-19 ENCOUNTER — Encounter: Payer: Self-pay | Admitting: Gastroenterology

## 2018-11-26 ENCOUNTER — Other Ambulatory Visit: Payer: Self-pay | Admitting: Family Medicine

## 2018-11-26 DIAGNOSIS — Z720 Tobacco use: Secondary | ICD-10-CM

## 2018-11-27 ENCOUNTER — Other Ambulatory Visit: Payer: Self-pay

## 2018-11-27 DIAGNOSIS — Z720 Tobacco use: Secondary | ICD-10-CM

## 2018-11-27 MED ORDER — ALBUTEROL SULFATE HFA 108 (90 BASE) MCG/ACT IN AERS: 2.0000 | INHALATION_SPRAY | RESPIRATORY_TRACT | 5 refills | Status: DC | PRN

## 2018-12-13 ENCOUNTER — Other Ambulatory Visit: Payer: Self-pay

## 2018-12-14 ENCOUNTER — Encounter: Payer: Self-pay | Admitting: Family Medicine

## 2018-12-14 ENCOUNTER — Ambulatory Visit (INDEPENDENT_AMBULATORY_CARE_PROVIDER_SITE_OTHER): Payer: BC Managed Care – PPO | Admitting: Family Medicine

## 2018-12-14 VITALS — BP 128/70 | HR 67 | Temp 97.6°F | Ht 70.0 in | Wt 254.0 lb

## 2018-12-14 DIAGNOSIS — Z720 Tobacco use: Secondary | ICD-10-CM | POA: Diagnosis not present

## 2018-12-14 DIAGNOSIS — Z125 Encounter for screening for malignant neoplasm of prostate: Secondary | ICD-10-CM

## 2018-12-14 DIAGNOSIS — J42 Unspecified chronic bronchitis: Secondary | ICD-10-CM

## 2018-12-14 DIAGNOSIS — Z23 Encounter for immunization: Secondary | ICD-10-CM | POA: Diagnosis not present

## 2018-12-14 DIAGNOSIS — M25561 Pain in right knee: Secondary | ICD-10-CM | POA: Diagnosis not present

## 2018-12-14 DIAGNOSIS — L570 Actinic keratosis: Secondary | ICD-10-CM | POA: Diagnosis not present

## 2018-12-14 DIAGNOSIS — G8929 Other chronic pain: Secondary | ICD-10-CM

## 2018-12-14 DIAGNOSIS — G4733 Obstructive sleep apnea (adult) (pediatric): Secondary | ICD-10-CM | POA: Diagnosis not present

## 2018-12-14 DIAGNOSIS — Z0000 Encounter for general adult medical examination without abnormal findings: Secondary | ICD-10-CM

## 2018-12-14 MED ORDER — FLUTICASONE PROPIONATE 50 MCG/ACT NA SUSP: 2.0000 | NASAL | 5 refills | Status: AC | PRN

## 2018-12-14 MED ORDER — ALBUTEROL SULFATE HFA 108 (90 BASE) MCG/ACT IN AERS: 2.0000 | INHALATION_SPRAY | RESPIRATORY_TRACT | 5 refills | Status: DC | PRN

## 2018-12-14 MED ORDER — UMECLIDINIUM BROMIDE 62.5 MCG/INH IN AEPB: 1.0000 | INHALATION_SPRAY | Freq: Every day | RESPIRATORY_TRACT | 5 refills | Status: DC

## 2018-12-14 MED ORDER — SYMBICORT 80-4.5 MCG/ACT IN AERO: 2.0000 | INHALATION_SPRAY | Freq: Two times a day (BID) | RESPIRATORY_TRACT | 5 refills | Status: DC

## 2018-12-14 MED ORDER — VARENICLINE TARTRATE 0.5 MG X 11 & 1 MG X 42 PO MISC: ORAL | 0 refills | Status: DC

## 2018-12-14 NOTE — Progress Notes (Signed)
Chief Complaint  Patient presents with  . Annual Exam    Well Male Mitchell Parrish is here for a complete physical.   His last physical was >1 year ago.  Current diet: in general, a "healthy" diet.  Current exercise: active at work Weight trend: stable Daytime fatigue? No. Seat belt? Yes.    Health maintenance Shingrix- No Colonoscopy- Yes Tetanus- Yes HIV- Yes Hep C- Yes Lung cancer screening-Had CTA earlier this year  Tobacco abuse Hx of tobacco abuse, >45 pack year hx. Has cut down since having issues with A fib and CHF. Wife smokes also. Interested in [place]. Had a CTA earlier in the year. Would be due for low dose CT chest.  Knee pain R knee pain for around 28 years. Injured long ago. Intermittent swelling. Failed conservative therapy up to steroid injection, that only helped for around 1 mo. Interested in opinion of specialist. No redness or bruising.  COPD Hx of COPD 2/2 smoking. Takes LAMA and ICS/LABA, usually compliant. No AE's. Does not freq use SABA. Needs refills. No SOB. Does get fatigued from cards meds.   Skin Has scaly skin lesions on ear and L arm that come and never fully go away. He is outside for his job at times and does not routinely wear sunscreen. No hx of skin cancer. There is no pain or drainage.    Past Medical History:  Diagnosis Date  . Acute diastolic CHF (congestive heart failure) ([facility]) 05/29/2018  . Arthritis    left hip  . Atrial fibrillation ([facility])   . Hyperlipidemia   . Hypertension   . Tubular adenoma of colon 11/2011      Past Surgical History:  Procedure Laterality Date  . BACK SURGERY    . CARDIOVERSION N/A 06/25/2018   Procedure: CARDIOVERSION (CATH LAB);  Surgeon: Evans Lance, MD;  Location: [facility];  Service: Cardiovascular;  Laterality: N/A;  . HERNIA REPAIR    . SPINE SURGERY     cervical fusion c6, c7 (1994), and c3,4,5,6 (2004)  . WISDOM TOOTH EXTRACTION      Medications  Current Outpatient  Medications on File Prior to Visit  Medication Sig Dispense Refill  . amiodarone (PACERONE) 200 MG tablet Take one tablet in the AM and 1/2 tablet in the PM 135 tablet 3  . apixaban (ELIQUIS) 5 MG TABS tablet Take 1 tablet (5 mg total) by mouth 2 (two) times daily. 60 tablet 6  . diltiazem (CARDIZEM CD) 240 MG 24 hr capsule Take 1 capsule (240 mg total) by mouth daily. 90 capsule 3  . furosemide (LASIX) 40 MG tablet Take 1 tablet (40 mg total) by mouth 2 (two) times daily. 180 tablet 3  . metoprolol succinate (TOPROL XL) 25 MG 24 hr tablet Take 1 tablet (25 mg total) by mouth at bedtime. 90 tablet 3  . OVER THE COUNTER MEDICATION Take 0.05 drops by mouth daily at 2 PM. CBD oil 240 mg on the bottle      Allergies Allergies  Allergen Reactions  . Lodine [Etodolac] Hives  . Percocet [Oxycodone-Acetaminophen] Swelling    Family History Family History  Problem Relation Age of Onset  . Diabetes Mother   . Lung cancer Mother 64  . Diabetes Father   . Heart disease Father   . Colon cancer Father 48       rectal cancer    Review of Systems: Constitutional:  no fevers Eye:  no recent significant change in vision Ear/Nose/Mouth/Throat:  Ears:  no hearing loss Nose/Mouth/Throat:  no complaints of nasal congestion, no sore throat Cardiovascular:  no chest pain, no palpitations Respiratory:  no cough and no shortness of breath Gastrointestinal:  no abdominal pain, no change in bowel habits GU:  Male: negative for dysuria, frequency, and incontinence and negative for prostate symptoms Musculoskeletal/Extremities: +R knee pain; otherwise no new pain, redness, or swelling of the joints Integumentary (Skin/Breast): +scaly lesions on LUE and R ear; otherwise no abnormal skin lesions reported Neurologic:  no headaches Endocrine: No unexpected weight changes Hematologic/Lymphatic:  no abnormal bleeding  Exam BP 128/70 (BP Location: Left Arm, Patient Position: Sitting, Cuff Size: Large)   Pulse  67   Temp 97.6 F (36.4 C) (Temporal)   Ht 5' 10"  (1.778 m)   Wt 254 lb (115.2 kg)   SpO2 96%   BMI 36.45 kg/m  General:  well developed, well nourished, in no apparent distress Skin: scaly patches on R ear x2 and LUE and L forearm; otherwise no significant moles, warts, or growths Head:  no masses, lesions, or tenderness Eyes:  pupils equal and round, sclera anicteric without injection Ears:  canals without lesions, TMs shiny without retraction, no obvious effusion, no erythema Nose:  nares patent, septum midline, mucosa normal Throat/Pharynx:  lips and gingiva without lesion; tongue and uvula midline; non-inflamed pharynx; no exudates or postnasal drainage Neck: neck supple without adenopathy, thyromegaly, or masses Lungs:  clear to auscultation, breath sounds equal bilaterally, no respiratory distress Rectal: Deferred Musculoskeletal: No current effusion or excessive warmth, nml ROM; otherwise symmetrical muscle groups noted without atrophy or deformity Extremities:  no clubbing, cyanosis, or edema, no deformities, no skin discoloration Neuro:  gait normal; deep tendon reflexes normal and symmetric Psych: well oriented with normal range of affect and appropriate judgment/insight  Assessment and Plan  Well adult exam - Plan: CBC, Comp Met (CMET), Lipid Profile  Tobacco abuse - Plan: varenicline (CHANTIX PAK) 0.5 MG X 11 & 1 MG X 42 tablet  Chronic bronchitis, unspecified chronic bronchitis type (HCC) - Plan: umeclidinium bromide (INCRUSE ELLIPTA) 62.5 MCG/INH AEPB, SYMBICORT 80-4.5 MCG/ACT inhaler, albuterol (VENTOLIN HFA) 108 (90 Base) MCG/ACT inhaler  Chronic pain of right knee - Plan: Ambulatory referral to Orthopedic Surgery  Screening for prostate cancer - Plan: PSA  Actinic keratosis - Plan: PR DESTRUCTION BENIGN LESIONS UP TO 14  Need for influenza vaccination - Plan: Flu Vaccine QUAD 6+ mos PF IM (Fluarix Quad PF)  Need for tetanus booster - Plan: Tdap vaccine  Need  for shingles vaccine - Plan: Varicella-zoster vaccine IM (Shingrix)   Well 63 y.o. male. Counseled on diet and exercise. Counseled on risks and benefits of prostate cancer screening with PSA. The patient agrees to undergo testing. Immunizations, labs, and further orders as above. Freeze AK's, if any return would consider biopsy or referral to derm. Trial Chantix.  Counseled on cessation. He is in action phase.  Refer to ortho given long standing knee pain.  Cont inhalers for COPD, currently well controlled.  Follow up in 1 yr or prn. The patient voiced understanding and agreement to the plan.  [place], DO 12/14/18 6:59 PM

## 2018-12-14 NOTE — Patient Instructions (Addendum)
Give [place] 2-3 business days to get the results of your labs back.   Keep the diet clean and stay active.  Aim to do some physical exertion for 150 minutes per week. This is typically divided into 5 days per week, 30 minutes per day. The activity should be enough to get your heart rate up. Anything is better than nothing if you have time constraints.  If you do not hear anything about your referral in the next 1-2 weeks, call our office and ask for an update.  Send me a message in 1 mo and ask for a 3 mo supply of your inhalers.   Let us know if you need anything.

## 2018-12-18 ENCOUNTER — Other Ambulatory Visit (INDEPENDENT_AMBULATORY_CARE_PROVIDER_SITE_OTHER): Payer: BC Managed Care – PPO

## 2018-12-18 ENCOUNTER — Other Ambulatory Visit: Payer: Self-pay

## 2018-12-18 ENCOUNTER — Other Ambulatory Visit: Payer: Self-pay | Admitting: Family Medicine

## 2018-12-18 DIAGNOSIS — Z125 Encounter for screening for malignant neoplasm of prostate: Secondary | ICD-10-CM

## 2018-12-18 DIAGNOSIS — Z0000 Encounter for general adult medical examination without abnormal findings: Secondary | ICD-10-CM

## 2018-12-18 DIAGNOSIS — E7800 Pure hypercholesterolemia, unspecified: Secondary | ICD-10-CM

## 2018-12-18 DIAGNOSIS — D582 Other hemoglobinopathies: Secondary | ICD-10-CM

## 2018-12-18 DIAGNOSIS — R739 Hyperglycemia, unspecified: Secondary | ICD-10-CM

## 2018-12-18 LAB — LIPID PANEL
Cholesterol: 240 mg/dL — ABNORMAL HIGH (ref 0–200)
HDL: 48.1 mg/dL (ref >39.00)
LDL Cholesterol: 170 mg/dL — ABNORMAL HIGH (ref 0–99)
NonHDL: 191.65
Total CHOL/HDL Ratio: 5
Triglycerides: 109 mg/dL (ref 0.0–149.0)
VLDL: 21.8 mg/dL (ref 0.0–40.0)

## 2018-12-18 LAB — CBC
HCT: 49.7 % (ref 39.0–52.0)
Hemoglobin: 17.2 g/dL — ABNORMAL HIGH (ref 13.0–17.0)
MCHC: 34.6 g/dL (ref 30.0–36.0)
MCV: 97.5 fl (ref 78.0–100.0)
Platelets: 211 10*3/uL (ref 150.0–400.0)
RBC: 5.1 Mil/uL (ref 4.22–5.81)
RDW: 13.9 % (ref 11.5–15.5)
WBC: 9 10*3/uL (ref 4.0–10.5)

## 2018-12-18 LAB — COMPREHENSIVE METABOLIC PANEL
ALT: 22 U/L (ref 0–53)
AST: 15 U/L (ref 0–37)
Albumin: 4.5 g/dL (ref 3.5–5.2)
Alkaline Phosphatase: 89 U/L (ref 39–117)
BUN: 19 mg/dL (ref 6–23)
CO2: 27 mEq/L (ref 19–32)
Calcium: 9.2 mg/dL (ref 8.4–10.5)
Chloride: 101 mEq/L (ref 96–112)
Creatinine, Ser: 1.07 mg/dL (ref 0.40–1.50)
GFR: 69.92 mL/min (ref >60.00)
Glucose, Bld: 112 mg/dL — ABNORMAL HIGH (ref 70–99)
Potassium: 4.6 mEq/L (ref 3.5–5.1)
Sodium: 136 mEq/L (ref 135–145)
Total Bilirubin: 0.8 mg/dL (ref 0.2–1.2)
Total Protein: 7 g/dL (ref 6.0–8.3)

## 2018-12-18 LAB — PSA: PSA: 0.54 ng/mL (ref 0.10–4.00)

## 2018-12-20 DIAGNOSIS — M25561 Pain in right knee: Secondary | ICD-10-CM | POA: Diagnosis not present

## 2018-12-20 DIAGNOSIS — M1711 Unilateral primary osteoarthritis, right knee: Secondary | ICD-10-CM | POA: Diagnosis not present

## 2018-12-20 DIAGNOSIS — M25562 Pain in left knee: Secondary | ICD-10-CM | POA: Diagnosis not present

## 2018-12-24 ENCOUNTER — Telehealth: Payer: Self-pay | Admitting: *Deleted

## 2018-12-24 NOTE — Telephone Encounter (Signed)
   [facility] Pre-operative Risk Assessment    Request for surgical clearance:  1. What type of surgery is being performed? RIGHT TOTAL KNEE ARTHROPLASTY   2. When is this surgery scheduled? 02/25/19   3. What type of clearance is required (medical clearance vs. Pharmacy clearance to hold med vs. Both)? BOTH  4. Are there any medications that need to be held prior to surgery and how long? ELIQUIS   5. Practice name and name of physician performing surgery? [facility]; DR. FRANK ALUISIO   6. What is your office phone number [phone]    7.   What is your office fax number [phone]  8.   Anesthesia type (None, local, MAC, general) ?  CHOICE   Mitchell Parrish 12/24/2018, 2:54 PM  _________________________________________________________________   (provider comments below)

## 2018-12-24 NOTE — Telephone Encounter (Signed)
Pt takes Eliquis for afib with CHADS2VASc score of 2 (CHF, HTN). Renal function is normal. Underwent cardioversion in May 2020. Recommend holding Eliquis for 3 days prior to procedure.

## 2018-12-24 NOTE — Telephone Encounter (Signed)
I have faxed clearance notes to Dr. Lovena Le for surgery clearance appt. I will send notes to Dr. Pilar Plate Aluisio as Juluis Rainier. I will remove from the pre op call back pool.

## 2018-12-24 NOTE — Telephone Encounter (Signed)
    Pre-op call back team:  Please add pre-operative clearance onto patients upcoming appointment with Dr. Lovena Le on 01/01/2019.   Thank you  Sharee Pimple

## 2018-12-26 NOTE — Telephone Encounter (Signed)
Received another clearance today for the same procedure, looks like surgery date has been moved up to 01/28/19. I will send notes to Dr. Lovena Le for appt on 01/01/19 that surgery date has been moved up. I will send notes to [place] Chard that pt has appt 01/01/19 with Dr. Lovena Le for surgery clearance.

## 2019-01-01 ENCOUNTER — Other Ambulatory Visit: Payer: BC Managed Care – PPO

## 2019-01-01 ENCOUNTER — Ambulatory Visit (INDEPENDENT_AMBULATORY_CARE_PROVIDER_SITE_OTHER): Payer: BC Managed Care – PPO | Admitting: Internal Medicine

## 2019-01-01 ENCOUNTER — Telehealth: Payer: Self-pay | Admitting: Family Medicine

## 2019-01-01 ENCOUNTER — Other Ambulatory Visit: Payer: Self-pay

## 2019-01-01 ENCOUNTER — Ambulatory Visit (HOSPITAL_COMMUNITY): Payer: BC Managed Care – PPO | Attending: Cardiology

## 2019-01-01 ENCOUNTER — Encounter: Payer: Self-pay | Admitting: Internal Medicine

## 2019-01-01 ENCOUNTER — Other Ambulatory Visit (INDEPENDENT_AMBULATORY_CARE_PROVIDER_SITE_OTHER): Payer: BC Managed Care – PPO

## 2019-01-01 VITALS — BP 140/74 | HR 64 | Ht 70.0 in | Wt 256.0 lb

## 2019-01-01 DIAGNOSIS — I428 Other cardiomyopathies: Secondary | ICD-10-CM | POA: Insufficient documentation

## 2019-01-01 DIAGNOSIS — D582 Other hemoglobinopathies: Secondary | ICD-10-CM

## 2019-01-01 DIAGNOSIS — R001 Bradycardia, unspecified: Secondary | ICD-10-CM | POA: Diagnosis not present

## 2019-01-01 DIAGNOSIS — I10 Essential (primary) hypertension: Secondary | ICD-10-CM | POA: Diagnosis not present

## 2019-01-01 DIAGNOSIS — I4819 Other persistent atrial fibrillation: Secondary | ICD-10-CM

## 2019-01-01 LAB — CBC
HCT: 48.8 % (ref 39.0–52.0)
Hemoglobin: 16.7 g/dL (ref 13.0–17.0)
MCHC: 34.2 g/dL (ref 30.0–36.0)
MCV: 97.3 fl (ref 78.0–100.0)
Platelets: 221 10*3/uL (ref 150.0–400.0)
RBC: 5.01 Mil/uL (ref 4.22–5.81)
RDW: 13.7 % (ref 11.5–15.5)
WBC: 9.2 10*3/uL (ref 4.0–10.5)

## 2019-01-01 MED ORDER — AMIODARONE HCL 200 MG PO TABS: 200.0000 mg | ORAL_TABLET | Freq: Every day | ORAL | 3 refills | Status: DC

## 2019-01-01 NOTE — Progress Notes (Signed)
HPI Mitchell Parrish returns today for followup. He is a pleasant 63 yo man with a h/o persistent atrial fib who has been controlled on amiodarone. He developed a tachy induced CM but is pending a repeat echo now that he has been maintained in NSR. He has a bad knee and is pending replacement. He denies chest pain or sob. No edema. He uses CPAP as he has sleep apnea. He has been gradually weaning down his dose of amiodarone. He has had no recurent symptomatic atrial fib.  Allergies  Allergen Reactions  . Lodine [Etodolac] Hives  . Percocet [Oxycodone-Acetaminophen] Swelling     Current Outpatient Medications  Medication Sig Dispense Refill  . albuterol (VENTOLIN HFA) 108 (90 Base) MCG/ACT inhaler Inhale 2 puffs into the lungs every 4 (four) hours as needed for wheezing or shortness of breath. 18 g 5  . amiodarone (PACERONE) 200 MG tablet Take one tablet in the AM and 1/2 tablet in the PM 135 tablet 3  . apixaban (ELIQUIS) 5 MG TABS tablet Take 1 tablet (5 mg total) by mouth 2 (two) times daily. 60 tablet 6  . diltiazem (CARDIZEM) 120 MG tablet Take 120 mg by mouth daily.    . fluticasone (FLONASE) 50 MCG/ACT nasal spray Place 2 sprays into both nostrils as needed for allergies or rhinitis. 16 g 5  . furosemide (LASIX) 40 MG tablet Take 1 tablet (40 mg total) by mouth 2 (two) times daily. 180 tablet 3  . metoprolol succinate (TOPROL XL) 25 MG 24 hr tablet Take 1 tablet (25 mg total) by mouth at bedtime. 90 tablet 3  . OVER THE COUNTER MEDICATION Take 0.05 drops by mouth daily at 2 PM. CBD oil 240 mg on the bottle     . SYMBICORT 80-4.5 MCG/ACT inhaler Inhale 2 puffs into the lungs 2 (two) times daily. 10.2 g 5  . umeclidinium bromide (INCRUSE ELLIPTA) 62.5 MCG/INH AEPB Inhale 1 puff into the lungs daily. 30 each 5  . varenicline (CHANTIX PAK) 0.5 MG X 11 & 1 MG X 42 tablet Take 0.5 mg tab by mouth daily for 3 days, then increase to 0.5 mg tab twice daily for 4 days, then increase to one 1  mg tab twice daily. 53 tablet 0   No current facility-administered medications for this visit.      Past Medical History:  Diagnosis Date  . Acute diastolic CHF (congestive heart failure) ([place]) 05/29/2018  . Arthritis    left hip  . Atrial fibrillation ([place])   . Hyperlipidemia   . Hypertension   . Tubular adenoma of colon 11/2011    ROS:   All systems reviewed and negative except as noted in the HPI.   Past Surgical History:  Procedure Laterality Date  . BACK SURGERY    . CARDIOVERSION N/A 06/25/2018   Procedure: CARDIOVERSION (CATH LAB);  Surgeon: Evans Lance, MD;  Location: [place] [facility];  Service: Cardiovascular;  Laterality: N/A;  . HERNIA REPAIR    . SPINE SURGERY     cervical fusion c6, c7 (1994), and c3,4,5,6 (2004)  . WISDOM TOOTH EXTRACTION       Family History  Problem Relation Age of Onset  . Diabetes Mother   . Lung cancer Mother 71  . Diabetes Father   . Heart disease Father   . Colon cancer Father 52       rectal cancer     Social History   Socioeconomic History  .  Marital status: Married    Spouse name: Not on file  . Number of children: 4  . Years of education: Not on file  . Highest education level: Not on file  Occupational History  . Occupation: Nurse, mental health: [employer]  Social Needs  . Financial resource strain: Not on file  . Food insecurity    Worry: Not on file    Inability: Not on file  . Transportation needs    Medical: Not on file    Non-medical: Not on file  Tobacco Use  . Smoking status: Current Every Day Smoker    Packs/day: 1.00    Years: 45.00    Pack years: 45.00    Types: Cigarettes  . Smokeless tobacco: Never Used  Substance and Sexual Activity  . Alcohol use: Yes    Comment: 2-3 drinks/ day  . Drug use: No  . Sexual activity: Yes  Lifestyle  . Physical activity    Days per week: Not on file    Minutes per session: Not on file  . Stress: Not on file  Relationships  . Social Product manager on phone: Not on file    Gets together: Not on file    Attends religious service: Not on file    Active member of club or organization: Not on file    Attends meetings of clubs or organizations: Not on file    Relationship status: Not on file  . Intimate partner violence    Fear of current or ex partner: Not on file    Emotionally abused: Not on file    Physically abused: Not on file    Forced sexual activity: Not on file  Other Topics Concern  . Not on file  Social History Narrative  . Not on file     BP 140/74   Pulse 64   Ht 5\' 10"  (1.778 m)   Wt 256 lb (116.1 kg)   SpO2 95%   BMI 36.73 kg/m   Physical Exam:  Well appearing NAD HEENT: Unremarkable Neck:  No JVD, no thyromegally Lymphatics:  No adenopathy Back:  No CVA tenderness Lungs:  Clear with no wheezes HEART:  Regular rate rhythm, no murmurs, no rubs, no clicks Abd:  soft, positive bowel sounds, no organomegally, no rebound, no guarding Ext:  2 plus pulses, no edema, no cyanosis, no clubbing Skin:  No rashes no nodules Neuro:  CN II through XII intact, motor grossly intact  EKG - nsr  Assess/Plan: 1. Persistent atrial fib - he is maintaining NSR and I have asked him to reduce his dose of amiodarone to 200 mg daily. I have recommended her go to see Dr. Curt Bears to consider catheter ablation.  2. Preoperative eval - his repeat echo is pending and I suspect it to be improved. He may proceed with knee replacement surgery as he would be low risk. 3. Combined systolic/diastolic heart failure - his symptoms are well controlled now that he is back in rhythm. I suspect his EF will have normalized. 4. HTN - his sbp is a little high. He is encouraged to lose weight and reduce his salt intake.  Mitchell Parrish.D.

## 2019-01-01 NOTE — Telephone Encounter (Signed)
Copied from Jud [id number]. Topic: General - Inquiry >> Jan 01, 2019 11:07 AM Alease Frame wrote: Reason for CRM: Patient is calling has an appt on [id number] for labs he will be admited in the hosp on the 21st and wanted to know if they could do his labs instead .   Please call patience back   Call back YH:[phone]  Just spoke to the patient//sent PCP a message as well///canceled the lab appt for now on 01/29/19

## 2019-01-01 NOTE — Patient Instructions (Addendum)
Medication Instructions:  Your physician has recommended you make the following change in your medication:   1.  Reduce your amiodarone 200 mg-  Take one tablet by mouth daily.  Labwork: None ordered.  Testing/Procedures: None ordered.  Follow-Up: Your physician wants you to follow-up in: 3 months with Dr. Lovena Le.      Any Other Special Instructions Will Be Listed Below (If Applicable).  If you need a refill on your cardiac medications before your next appointment, please call your pharmacy.   IF YOU DECIDE YOU WOULD LIKE TO PURSUE AFIB ABLATION, PLEASE CALL ME.  WE WILL REFER YOU TO DR. Curt Bears  Cardiac Ablation Cardiac ablation is a procedure to disable (ablate) a small amount of heart tissue in very specific places. The heart has many electrical connections. Sometimes these connections are abnormal and can cause the heart to beat very fast or irregularly. Ablating some of the problem areas can improve the heart rhythm or return it to normal. Ablation may be done for people who:  Have Wolff-Parkinson-White syndrome.  Have fast heart rhythms (tachycardia).  Have taken medicines for an abnormal heart rhythm (arrhythmia) that were not effective or caused side effects.  Have a high-risk heartbeat that may be life-threatening. During the procedure, a small incision is made in the neck or the groin, and a long, thin, flexible tube (catheter) is inserted into the incision and moved to the heart. Small devices (electrodes) on the tip of the catheter will send out electrical currents. A type of X-ray (fluoroscopy) will be used to help guide the catheter and to provide images of the heart. Tell a health care provider about:  Any allergies you have.  All medicines you are taking, including vitamins, herbs, eye drops, creams, and over-the-counter medicines.  Any problems you or family members have had with anesthetic medicines.  Any blood disorders you have.  Any surgeries you have  had.  Any medical conditions you have, such as kidney failure.  Whether you are pregnant or may be pregnant. What are the risks? Generally, this is a safe procedure. However, problems may occur, including:  Infection.  Bruising and bleeding at the catheter insertion site.  Bleeding into the chest, especially into the sac that surrounds the heart. This is a serious complication.  Stroke or blood clots.  Damage to other structures or organs.  Allergic reaction to medicines or dyes.  Need for a permanent pacemaker if the normal electrical system is damaged. A pacemaker is a small computer that sends electrical signals to the heart and helps your heart beat normally.  The procedure not being fully effective. This may not be recognized until months later. Repeat ablation procedures are sometimes required. What happens before the procedure?  Follow instructions from your health care provider about eating or drinking restrictions.  Ask your health care provider about: ? Changing or stopping your regular medicines. This is especially important if you are taking diabetes medicines or blood thinners. ? Taking medicines such as aspirin and ibuprofen. These medicines can thin your blood. Do not take these medicines before your procedure if your health care provider instructs you not to.  Plan to have someone take you home from the hospital or clinic.  If you will be going home right after the procedure, plan to have someone with you for 24 hours. What happens during the procedure?  To lower your risk of infection: ? Your health care team will wash or sanitize their hands. ? Your skin will be  washed with soap. ? Hair may be removed from the incision area.  An IV tube will be inserted into one of your veins.  You will be given a medicine to help you relax (sedative).  The skin on your neck or groin will be numbed.  An incision will be made in your neck or your groin.  A needle will  be inserted through the incision and into a large vein in your neck or groin.  A catheter will be inserted into the needle and moved to your heart.  Dye may be injected through the catheter to help your surgeon see the area of the heart that needs treatment.  Electrical currents will be sent from the catheter to ablate heart tissue in desired areas. There are three types of energy that may be used to ablate heart tissue: ? Heat (radiofrequency energy). ? Laser energy. ? Extreme cold (cryoablation).  When the necessary tissue has been ablated, the catheter will be removed.  Pressure will be held on the catheter insertion area to prevent excessive bleeding.  A bandage (dressing) will be placed over the catheter insertion area. The procedure may vary among health care providers and hospitals. What happens after the procedure?  Your blood pressure, heart rate, breathing rate, and blood oxygen level will be monitored until the medicines you were given have worn off.  Your catheter insertion area will be monitored for bleeding. You will need to lie still for a few hours to ensure that you do not bleed from the catheter insertion area.  Do not drive for 24 hours or as long as directed by your health care provider. Summary  Cardiac ablation is a procedure to disable (ablate) a small amount of heart tissue in very specific places. Ablating some of the problem areas can improve the heart rhythm or return it to normal.  During the procedure, electrical currents will be sent from the catheter to ablate heart tissue in desired areas. This information is not intended to replace advice given to you by your health care provider. Make sure you discuss any questions you have with your health care provider. Document Released: 06/12/2008 Document Revised: 07/17/2017 Document Reviewed: 12/14/2015 Elsevier Patient Education  2020 [employer].

## 2019-01-07 DIAGNOSIS — I4819 Other persistent atrial fibrillation: Secondary | ICD-10-CM

## 2019-01-07 NOTE — Telephone Encounter (Signed)
   Primary Cardiologist: Cristopher Peru, MD  Chart reviewed as part of pre-operative protocol coverage. Patient has a history of persistent atrial fibrillation that has been controlled on Amiodarone, tachy-mediated cardiomyopathy now improve with restoration of sinus rhythm, hypertension, and hyperlipidemia. He was last seen by Dr. Lovena Le on 01/01/2019 at which time he was doing well and denied any chest pain or shortness of breath. Repeat Echo from 01/01/2019 showed LVEF of 60-65% with normal wall motion and grade 2 diastolic dysfunction.  Per Dr. Lovena Le, "he may proceed with knee replacement surgery as he would be low risk."  Per Pharmacy, "recommend holding Eliquis for 3 days prior to procedure." Should restart as soon as able following procedure.  I will route this recommendation to the requesting party via Epic fax function and remove from pre-op pool.  Please call with questions.  Darreld Mclean, PA-C 01/07/2019, 8:39 AM

## 2019-01-09 NOTE — H&P (Signed)
TOTAL KNEE ADMISSION H&P  Patient is being admitted for right total knee arthroplasty.  Subjective:  Chief Complaint:right knee pain.  HPI: Mitchell Parrish, 63 y.o. male, has a history of pain and functional disability in the right knee due to arthritis and has failed non-surgical conservative treatments for greater than 12 weeks to includecorticosteriod injections and activity modification.  Onset of symptoms was gradual, starting 3 years ago with gradually worsening course since that time. The patient noted no past surgery on the right knee(s).  Patient currently rates pain in the right knee(s) at 7 out of 10 with activity. Patient has worsening of pain with activity and weight bearing, crepitus and instability.  Patient has evidence of bone-on-bone osteoarthritis in the medial compartment of the right knee with significant patellofemoral narrowing and large osteophytes anteriorly and posteriorly by imaging studies. There is no active infection.  Patient Active Problem List   Diagnosis Date Noted  . Sinus bradycardia 01/01/2019  . Chronic pain of right knee 12/14/2018  . Actinic keratosis 12/14/2018  . Nonischemic cardiomyopathy ([place]) 10/08/2018  . Chronic diastolic heart failure ([place]) 07/31/2018  . Atrial fibrillation ([place]) 06/25/2018  . Snoring 06/21/2018  . Acute diastolic CHF (congestive heart failure) ([place]) 05/29/2018  . Chronic obstructive pulmonary disease ([place])   . Other emphysema ([place])   . Tobacco abuse   . Atrial fibrillation with RVR ([place]) 05/25/2018  . Atrial flutter with rapid ventricular response ([place]) 05/25/2018  . BPH (benign prostatic hyperplasia) 03/24/2016  . Other and unspecified hyperlipidemia 10/30/2012  . Hip pain, chronic 10/30/2012  . Degeneration of cervical intervertebral disc 10/30/2012  . Insomnia 10/30/2012  . Numbness of left anterior thigh 03/15/2012  . Seborrheic keratosis 09/27/2011  . Hypertension 09/26/2011  . Obesity (BMI 30-39.9) 09/26/2011   . Gilbert's syndrome 09/26/2011  . Nicotine abuse 09/26/2011   Past Medical History:  Diagnosis Date  . Acute diastolic CHF (congestive heart failure) ([place]) 05/29/2018  . Arthritis    left hip  . Atrial fibrillation ([place])   . Hyperlipidemia   . Hypertension   . Tubular adenoma of colon 11/2011    Past Surgical History:  Procedure Laterality Date  . BACK SURGERY    . CARDIOVERSION N/A 06/25/2018   Procedure: CARDIOVERSION (CATH LAB);  Surgeon: Evans Lance, MD;  Location: [facility];  Service: Cardiovascular;  Laterality: N/A;  . HERNIA REPAIR    . SPINE SURGERY     cervical fusion c6, c7 (1994), and c3,4,5,6 (2004)  . WISDOM TOOTH EXTRACTION      No current facility-administered medications for this encounter.    Current Outpatient Medications  Medication Sig Dispense Refill Last Dose  . albuterol (VENTOLIN HFA) 108 (90 Base) MCG/ACT inhaler Inhale 2 puffs into the lungs every 4 (four) hours as needed for wheezing or shortness of breath. 18 g 5 Taking  . amiodarone (PACERONE) 200 MG tablet Take 1 tablet (200 mg total) by mouth daily. 90 tablet 3   . apixaban (ELIQUIS) 5 MG TABS tablet Take 1 tablet (5 mg total) by mouth 2 (two) times daily. 60 tablet 6 Taking  . diltiazem (CARDIZEM) 120 MG tablet Take 120 mg by mouth daily.   Taking  . fluticasone (FLONASE) 50 MCG/ACT nasal spray Place 2 sprays into both nostrils as needed for allergies or rhinitis. 16 g 5 Taking  . furosemide (LASIX) 40 MG tablet Take 1 tablet (40 mg total) by mouth 2 (two) times daily. 180 tablet 3 Taking  .  metoprolol succinate (TOPROL XL) 25 MG 24 hr tablet Take 1 tablet (25 mg total) by mouth at bedtime. 90 tablet 3 Taking  . OVER THE COUNTER MEDICATION Take 0.05 drops by mouth daily at 2 PM. CBD oil 240 mg on the bottle    Taking  . SYMBICORT 80-4.5 MCG/ACT inhaler Inhale 2 puffs into the lungs 2 (two) times daily. 10.2 g 5 Taking  . umeclidinium bromide (INCRUSE ELLIPTA) 62.5 MCG/INH AEPB Inhale 1  puff into the lungs daily. 30 each 5 Taking  . varenicline (CHANTIX PAK) 0.5 MG X 11 & 1 MG X 42 tablet Take 0.5 mg tab by mouth daily for 3 days, then increase to 0.5 mg tab twice daily for 4 days, then increase to one 1 mg tab twice daily. 53 tablet 0 Taking   Allergies  Allergen Reactions  . Lodine [Etodolac] Hives  . Percocet [Oxycodone-Acetaminophen] Swelling    Social History   Tobacco Use  . Smoking status: Current Every Day Smoker    Packs/day: 1.00    Years: 45.00    Pack years: 45.00    Types: Cigarettes  . Smokeless tobacco: Never Used  Substance Use Topics  . Alcohol use: Yes    Comment: 2-3 drinks/ day    Family History  Problem Relation Age of Onset  . Diabetes Mother   . Lung cancer Mother 18  . Diabetes Father   . Heart disease Father   . Colon cancer Father 50       rectal cancer     Review of Systems  Constitutional: Negative for chills and fever.  HENT: Negative for congestion, sore throat and tinnitus.   Eyes: Negative for double vision, photophobia and pain.  Respiratory: Negative for cough, shortness of breath and wheezing.   Cardiovascular: Negative for chest pain, palpitations and orthopnea.  Gastrointestinal: Negative for heartburn, nausea and vomiting.  Genitourinary: Negative for dysuria, frequency and urgency.  Musculoskeletal: Positive for joint pain.  Neurological: Negative for dizziness, weakness and headaches.    Objective:  Physical Exam  Well nourished and well developed.  General: Alert and oriented x3, cooperative and pleasant, no acute distress.  Head: normocephalic, atraumatic, neck supple.  Eyes: EOMI.  Respiratory: breath sounds clear in all fields, no wheezing, rales, or rhonchi. Cardiovascular: Regular rate and rhythm, no murmurs, gallops or rubs.  Abdomen: non-tender to palpation and soft, normoactive bowel sounds. Musculoskeletal:  Right Knee Exam: No effusion. Range of motion is 5-130 degrees. Moderate crepitus on  range of motion of the knee. Positive medial, greater than lateral, joint line tenderness. Stable knee.  Calves soft and nontender. Motor function intact in LE. Strength 5/5 LE bilaterally. Neuro: Distal pulses 2+. Sensation to light touch intact in LE.   Vital signs in last 24 hours: Blood pressure: 140/82 mmHg Pulse: 72 bpm  Labs:   Estimated body mass index is 36.73 kg/m as calculated from the following:   Height as of 01/01/19: 5\' 10"  (1.778 m).   Weight as of 01/01/19: 116.1 kg.   Imaging Review Plain radiographs demonstrate severe degenerative joint disease of the right knee(s). The overall alignment isneutral. The bone quality appears to be adequate for age and reported activity level.  Assessment/Plan:  End stage arthritis, right knee   The patient history, physical examination, clinical judgment of the provider and imaging studies are consistent with end stage degenerative joint disease of the right knee(s) and total knee arthroplasty is deemed medically necessary. The treatment options including medical management,  injection therapy arthroscopy and arthroplasty were discussed at length. The risks and benefits of total knee arthroplasty were presented and reviewed. The risks due to aseptic loosening, infection, stiffness, patella tracking problems, thromboembolic complications and other imponderables were discussed. The patient acknowledged the explanation, agreed to proceed with the plan and consent was signed. Patient is being admitted for inpatient treatment for surgery, pain control, PT, OT, prophylactic antibiotics, VTE prophylaxis, progressive ambulation and ADL's and discharge planning. The patient is planning to be discharged home.  Anticipated LOS equal to or greater than 2 midnights due to - Age 5 and older with one or more of the following:  - Obesity  - Expected need for hospital services (PT, OT, Nursing) required for safe  discharge  - Anticipated need for  postoperative skilled nursing care or inpatient rehab  - Active co-morbidities: Cardiac Arrhythmia OR   - Unanticipated findings during/Post Surgery: None  - Patient is a high risk of re-admission due to: None  Therapy Plans: Outpatient therapy at [facility] Disposition: Home with wife and daughter Planned DVT Prophylaxis: Eliquis 5 mg BID (takes for atrial fibrillation) DME needed: None PCP: Dr. Wylene Simmer Cardiologist: Dr. Lovena Le TXA: IV Allergies: NKDA  Anesthesia Concerns: None BMI: 36.1  - Patient was instructed on what medications to stop prior to surgery. - Follow-up visit in 2 weeks with Dr. Wynelle Link - Begin physical therapy following surgery - Pre-operative lab work as pre-surgical testing - Prescriptions will be provided in hospital at time of discharge  Theresa Duty, PA-C Orthopedic Surgery [facility] [place]

## 2019-01-23 NOTE — Patient Instructions (Addendum)
DUE TO COVID-19 ONLY ONE VISITOR IS ALLOWED TO COME WITH YOU AND STAY IN THE WAITING ROOM ONLY DURING PRE OP AND PROCEDURE DAY OF SURGERY. THE 1 VISITOR MAY VISIT WITH YOU AFTER SURGERY IN YOUR PRIVATE ROOM DURING VISITING HOURS ONLY!  YOU NEED TO HAVE A COVID 19 TEST ON 01-24-19 @2 :15 PM, THIS TEST MUST BE DONE BEFORE SURGERY, COME  [address] , [id number].  ([place]) ONCE YOUR COVID TEST IS COMPLETED, PLEASE BEGIN THE QUARANTINE INSTRUCTIONS AS OUTLINED IN YOUR HANDOUT.                Mitchell Parrish  01/23/2019   Your procedure is scheduled on: 01-28-19   Report to [facility] Main  Entrance    Report to Admitting at 10:00 AM     Call this number if you have problems the morning of surgery [phone]    Remember: NO SOLID FOOD AFTER MIDNIGHT THE NIGHT PRIOR TO SURGERY. NOTHING BY MOUTH EXCEPT CLEAR LIQUIDS UNTIL 9:30 AM . PLEASE FINISH ENSURE DRINK PER SURGEON ORDER  WHICH NEEDS TO BE COMPLETED AT 9:30 AM.   CLEAR LIQUID DIET   Foods Allowed                                                                     Foods Excluded  Coffee and tea, regular and decaf                             liquids that you cannot  Plain Jell-O any favor except red or purple                                           see through such as: Fruit ices (not with fruit pulp)                                     milk, soups, orange juice  Iced Popsicles                                    All solid food Carbonated beverages, regular and diet                                    Cranberry, grape and apple juices Sports drinks like Gatorade Lightly seasoned clear broth or consume(fat free) Sugar, honey syrup   _____________________________________________________________________     Take these medicines the morning of surgery with A SIP OF WATER: Amiodarone (Pacerone), Diltiazem (Cardizem). You may also use and bring your inhaler, and nasal spray                               You may not have any metal on your body including hair pins and  piercings    Do not wear jewelry, cologne, lotions, powders or deodorant                        Men may shave face and neck.   Do not bring valuables to the hospital. [place].  Contacts, dentures or bridgework may not be worn into surgery.   You may bring an overnight bag     Special Instructions: N/A              Please read over the following fact sheets you were given: _____________________________________________________________________             [facility] - Preparing for Surgery Before surgery, you can play an important role.  Because skin is not sterile, your skin needs to be as free of germs as possible.  You can reduce the number of germs on your skin by washing with CHG (chlorahexidine gluconate) soap before surgery.  CHG is an antiseptic cleaner which kills germs and bonds with the skin to continue killing germs even after washing. Please DO NOT use if you have an allergy to CHG or antibacterial soaps.  If your skin becomes reddened/irritated stop using the CHG and inform your nurse when you arrive at Short Stay. Do not shave (including legs and underarms) for at least 48 hours prior to the first CHG shower.  You may shave your face/neck. Please follow these instructions carefully:  1.  Shower with CHG Soap the night before surgery and the  morning of Surgery.  2.  If you choose to wash your hair, wash your hair first as usual with your  normal  shampoo.  3.  After you shampoo, rinse your hair and body thoroughly to remove the  shampoo.                           4.  Use CHG as you would any other liquid soap.  You can apply chg directly  to the skin and wash                       Gently with a scrungie or clean washcloth.  5.  Apply the CHG Soap to your body ONLY FROM THE NECK DOWN.   Do not use on face/ open                            Wound or open sores. Avoid contact with eyes, ears mouth and genitals (private parts).                       Wash face,  Genitals (private parts) with your normal soap.             6.  Wash thoroughly, paying special attention to the area where your surgery  will be performed.  7.  Thoroughly rinse your body with warm water from the neck down.  8.  DO NOT shower/wash with your normal soap after using and rinsing off  the CHG Soap.                9.  Pat yourself dry with a clean towel.            10.  Wear clean  pajamas.            11.  Place clean sheets on your bed the night of your first shower and do not  sleep with pets. Day of Surgery : Do not apply any lotions/deodorants the morning of surgery.  Please wear clean clothes to the hospital/surgery center.  FAILURE TO FOLLOW THESE INSTRUCTIONS MAY RESULT IN THE CANCELLATION OF YOUR SURGERY PATIENT SIGNATURE_________________________________  NURSE SIGNATURE__________________________________  ________________________________________________________________________   Mitchell Parrish  An incentive spirometer is a tool that can help keep your lungs clear and active. This tool measures how well you are filling your lungs with each breath. Taking long deep breaths may help reverse or decrease the chance of developing breathing (pulmonary) problems (especially infection) following:  A long period of time when you are unable to move or be active. BEFORE THE PROCEDURE   If the spirometer includes an indicator to show your best effort, your nurse or respiratory therapist will set it to a desired goal.  If possible, sit up straight or lean slightly forward. Try not to slouch.  Hold the incentive spirometer in an upright position. INSTRUCTIONS FOR USE  1. Sit on the edge of your bed if possible, or sit up as far as you can in bed or on a chair. 2. Hold the incentive spirometer in an upright position. 3. Breathe out  normally. 4. Place the mouthpiece in your mouth and seal your lips tightly around it. 5. Breathe in slowly and as deeply as possible, raising the piston or the ball toward the top of the column. 6. Hold your breath for 3-5 seconds or for as long as possible. Allow the piston or ball to fall to the bottom of the column. 7. Remove the mouthpiece from your mouth and breathe out normally. 8. Rest for a few seconds and repeat Steps 1 through 7 at least 10 times every 1-2 hours when you are awake. Take your time and take a few normal breaths between deep breaths. 9. The spirometer may include an indicator to show your best effort. Use the indicator as a goal to work toward during each repetition. 10. After each set of 10 deep breaths, practice coughing to be sure your lungs are clear. If you have an incision (the cut made at the time of surgery), support your incision when coughing by placing a pillow or rolled up towels firmly against it. Once you are able to get out of bed, walk around indoors and cough well. You may stop using the incentive spirometer when instructed by your caregiver.  RISKS AND COMPLICATIONS  Take your time so you do not get dizzy or light-headed.  If you are in pain, you may need to take or ask for pain medication before doing incentive spirometry. It is harder to take a deep breath if you are having pain. AFTER USE  Rest and breathe slowly and easily.  It can be helpful to keep track of a log of your progress. Your caregiver can provide you with a simple table to help with this. If you are using the spirometer at home, follow these instructions: [place] IF:   You are having difficultly using the spirometer.  You have trouble using the spirometer as often as instructed.  Your pain medication is not giving enough relief while using the spirometer.  You develop fever of 100.5 F (38.1 C) or higher. SEEK IMMEDIATE MEDICAL CARE IF:   You cough up bloody sputum  that had not  been present before.  You develop fever of 102 F (38.9 C) or greater.  You develop worsening pain at or near the incision site. MAKE SURE YOU:   Understand these instructions.  Will watch your condition.  Will get help right away if you are not doing well or get worse. Document Released: 06/06/2006 Document Revised: 04/18/2011 Document Reviewed: 08/07/2006 Exit[employer] Patient Information 2014 Exit[employer], [place].   ________________________________________________________________________  WHAT IS A BLOOD TRANSFUSION? Blood Transfusion Information  A transfusion is the replacement of blood or some of its parts. Blood is made up of multiple cells which provide different functions.  Red blood cells carry oxygen and are used for blood loss replacement.  White blood cells fight against infection.  Platelets control bleeding.  Plasma helps clot blood.  Other blood products are available for specialized needs, such as hemophilia or other clotting disorders. BEFORE THE TRANSFUSION  Who gives blood for transfusions?   Healthy volunteers who are fully evaluated to make sure their blood is safe. This is blood bank blood. Transfusion therapy is the safest it has ever been in the practice of medicine. Before blood is taken from a donor, a complete history is taken to make sure that person has no history of diseases nor engages in risky social behavior (examples are intravenous drug use or sexual activity with multiple partners). The donor's travel history is screened to minimize risk of transmitting infections, such as malaria. The donated blood is tested for signs of infectious diseases, such as HIV and hepatitis. The blood is then tested to be sure it is compatible with you in order to minimize the chance of a transfusion reaction. If you or a relative donates blood, this is often done in anticipation of surgery and is not appropriate for emergency situations. It takes many days to  process the donated blood. RISKS AND COMPLICATIONS Although transfusion therapy is very safe and saves many lives, the main dangers of transfusion include:   Getting an infectious disease.  Developing a transfusion reaction. This is an allergic reaction to something in the blood you were given. Every precaution is taken to prevent this. The decision to have a blood transfusion has been considered carefully by your caregiver before blood is given. Blood is not given unless the benefits outweigh the risks. AFTER THE TRANSFUSION  Right after receiving a blood transfusion, you will usually feel much better and more energetic. This is especially true if your red blood cells have gotten low (anemic). The transfusion raises the level of the red blood cells which carry oxygen, and this usually causes an energy increase.  The nurse administering the transfusion will monitor you carefully for complications. HOME CARE INSTRUCTIONS  No special instructions are needed after a transfusion. You may find your energy is better. Speak with your caregiver about any limitations on activity for underlying diseases you may have. SEEK MEDICAL CARE IF:   Your condition is not improving after your transfusion.  You develop redness or irritation at the intravenous (IV) site. SEEK IMMEDIATE MEDICAL CARE IF:  Any of the following symptoms occur over the next 12 hours:  Shaking chills.  You have a temperature by mouth above 102 F (38.9 C), not controlled by medicine.  Chest, back, or muscle pain.  People around you feel you are not acting correctly or are confused.  Shortness of breath or difficulty breathing.  Dizziness and fainting.  You get a rash or develop hives.  You have a decrease in urine output.  Your urine turns a dark color or changes to pink, red, or brown. Any of the following symptoms occur over the next 10 days:  You have a temperature by mouth above 102 F (38.9 C), not controlled by  medicine.  Shortness of breath.  Weakness after normal activity.  The white part of the eye turns yellow (jaundice).  You have a decrease in the amount of urine or are urinating less often.  Your urine turns a dark color or changes to pink, red, or brown. Document Released: 01/22/2000 Document Revised: 04/18/2011 Document Reviewed: 09/10/2007 [facility] Patient Information 2014 [place], [place].  _______________________________________________________________________

## 2019-01-24 ENCOUNTER — Encounter (HOSPITAL_COMMUNITY)
Admission: RE | Admit: 2019-01-24 | Discharge: 2019-01-24 | Disposition: A | Discharge status: Home / Self Care | Payer: BC Managed Care – PPO | Source: Ambulatory Visit | Attending: Orthopedic Surgery | Admitting: Orthopedic Surgery

## 2019-01-24 ENCOUNTER — Ambulatory Visit (INDEPENDENT_AMBULATORY_CARE_PROVIDER_SITE_OTHER): Payer: BC Managed Care – PPO | Admitting: Cardiology

## 2019-01-24 ENCOUNTER — Encounter: Payer: Self-pay | Admitting: Cardiology

## 2019-01-24 ENCOUNTER — Encounter (HOSPITAL_COMMUNITY): Payer: Self-pay

## 2019-01-24 ENCOUNTER — Other Ambulatory Visit (HOSPITAL_COMMUNITY)
Admission: RE | Admit: 2019-01-24 | Discharge: 2019-01-24 | Disposition: A | Discharge status: Home / Self Care | Payer: BC Managed Care – PPO | Source: Ambulatory Visit | Attending: Orthopedic Surgery | Admitting: Orthopedic Surgery

## 2019-01-24 ENCOUNTER — Other Ambulatory Visit: Payer: Self-pay

## 2019-01-24 VITALS — BP 132/70 | HR 69 | Ht 70.0 in | Wt 258.0 lb

## 2019-01-24 DIAGNOSIS — I5040 Unspecified combined systolic (congestive) and diastolic (congestive) heart failure: Secondary | ICD-10-CM | POA: Insufficient documentation

## 2019-01-24 DIAGNOSIS — Z20828 Contact with and (suspected) exposure to other viral communicable diseases: Secondary | ICD-10-CM | POA: Insufficient documentation

## 2019-01-24 DIAGNOSIS — M1711 Unilateral primary osteoarthritis, right knee: Secondary | ICD-10-CM | POA: Insufficient documentation

## 2019-01-24 DIAGNOSIS — I110 Hypertensive heart disease with heart failure: Secondary | ICD-10-CM | POA: Insufficient documentation

## 2019-01-24 DIAGNOSIS — Z7901 Long term (current) use of anticoagulants: Secondary | ICD-10-CM | POA: Diagnosis not present

## 2019-01-24 DIAGNOSIS — Z01812 Encounter for preprocedural laboratory examination: Secondary | ICD-10-CM | POA: Insufficient documentation

## 2019-01-24 DIAGNOSIS — I4819 Other persistent atrial fibrillation: Secondary | ICD-10-CM

## 2019-01-24 DIAGNOSIS — Z79899 Other long term (current) drug therapy: Secondary | ICD-10-CM | POA: Insufficient documentation

## 2019-01-24 LAB — SURGICAL PCR SCREEN
MRSA, PCR: NEGATIVE
Staphylococcus aureus: NEGATIVE

## 2019-01-24 NOTE — Progress Notes (Signed)
Electrophysiology Office Note   Date:  01/24/2019   ID:  AVARY GOODE, DOB [birth year], MRN [id number]  PCP:  [name], DO  Cardiologist:  Lovena Le Primary Electrophysiologist: Gaye Alken, MD    Chief Complaint: AF   History of Present Illness: Mitchell Parrish is a 63 y.o. male who is being seen today for the evaluation of AF at the request of Evans Lance, MD. Presenting today for electrophysiology evaluation.  He has a history of persistent atrial fibrillation, diastolic heart failure, hypertension, hyperlipidemia.  He has been on amiodarone.  He developed a tachycardia induced cardiomyopathy, but fortunately his ejection fraction has improved.  Today, he denies symptoms of palpitations, chest pain, shortness of breath, orthopnea, PND, lower extremity edema, claudication, dizziness, presyncope, syncope, bleeding, or neurologic sequela. The patient is tolerating medications without difficulties.    Past Medical History:  Diagnosis Date  . Acute diastolic CHF (congestive heart failure) ([place]) 05/29/2018  . Arthritis    left hip  . Atrial fibrillation ([place])   . Hyperlipidemia   . Hypertension   . Tubular adenoma of colon 11/2011   Past Surgical History:  Procedure Laterality Date  . BACK SURGERY    . CARDIOVERSION N/A 06/25/2018   Procedure: CARDIOVERSION (CATH LAB);  Surgeon: Evans Lance, MD;  Location: [facility];  Service: Cardiovascular;  Laterality: N/A;  . HERNIA REPAIR    . SPINE SURGERY     cervical fusion c6, c7 (1994), and c3,4,5,6 (2004)  . WISDOM TOOTH EXTRACTION       Current Outpatient Medications  Medication Sig Dispense Refill  . albuterol (VENTOLIN HFA) 108 (90 Base) MCG/ACT inhaler Inhale 2 puffs into the lungs every 4 (four) hours as needed for wheezing or shortness of breath. 18 g 5  . amiodarone (PACERONE) 200 MG tablet Take 1 tablet (200 mg total) by mouth daily. 90 tablet 3  . apixaban (ELIQUIS) 5  MG TABS tablet Take 1 tablet (5 mg total) by mouth 2 (two) times daily. 60 tablet 6  . diltiazem (CARDIZEM) 120 MG tablet Take 120 mg by mouth daily.    . fluticasone (FLONASE) 50 MCG/ACT nasal spray Place 2 sprays into both nostrils as needed for allergies or rhinitis. 16 g 5  . furosemide (LASIX) 40 MG tablet Take 1 tablet (40 mg total) by mouth 2 (two) times daily. 180 tablet 3  . metoprolol succinate (TOPROL XL) 25 MG 24 hr tablet Take 1 tablet (25 mg total) by mouth at bedtime. 90 tablet 3  . OVER THE COUNTER MEDICATION Take 0.05 drops by mouth daily at 2 PM. CBD oil 240 mg on the bottle     . SYMBICORT 80-4.5 MCG/ACT inhaler Inhale 2 puffs into the lungs 2 (two) times daily. 10.2 g 5  . umeclidinium bromide (INCRUSE ELLIPTA) 62.5 MCG/INH AEPB Inhale 1 puff into the lungs daily. 30 each 5  . varenicline (CHANTIX PAK) 0.5 MG X 11 & 1 MG X 42 tablet Take 0.5 mg tab by mouth daily for 3 days, then increase to 0.5 mg tab twice daily for 4 days, then increase to one 1 mg tab twice daily. 53 tablet 0   No current facility-administered medications for this visit.    Allergies:   Lodine [etodolac]   Social History:  The patient  reports that he has been smoking cigarettes. He has a 45.00 pack-year smoking history. He has never used smokeless tobacco. He reports current alcohol use. He  reports that he does not use drugs.   Family History:  The patient's family history includes Colon cancer (age of onset: 73) in his father; Diabetes in his father and mother; Heart disease in his father; Lung cancer (age of onset: 104) in his mother.    ROS:  Please see the history of present illness.   Otherwise, review of systems is positive for none.   All other systems are reviewed and negative.    PHYSICAL EXAM: VS:  BP 132/70   Pulse 69   Ht 5\' 10"  (1.778 m)   Wt 258 lb (117 kg)   SpO2 98%   BMI 37.02 kg/m  , BMI Body mass index is 37.02 kg/m. GEN: Well nourished, well developed, in no acute distress    HEENT: normal  Neck: no JVD, carotid bruits, or masses Cardiac: RRR; no murmurs, rubs, or gallops,no edema  Respiratory:  clear to auscultation bilaterally, normal work of breathing GI: soft, nontender, nondistended, + BS MS: no deformity or atrophy  Skin: warm and dry Neuro:  Strength and sensation are intact Psych: euthymic mood, full affect  EKG:  EKG is not ordered today. Personal review of the ekg ordered 01/01/19 shows sinus rhythm, rate 64  Recent Labs: 05/25/2018: B Natriuretic Peptide 198.8 05/27/2018: TSH 1.760 06/28/2018: Magnesium 2.3 12/18/2018: ALT 22; BUN 19; Creatinine, Ser 1.07; Potassium 4.6; Sodium 136 01/01/2019: Hemoglobin 16.7; Platelets 221.0    Lipid Panel     Component Value Date/Time   CHOL 240 (H) 12/18/2018 0737   TRIG 109.0 12/18/2018 0737   HDL 48.10 12/18/2018 0737   CHOLHDL 5 12/18/2018 0737   VLDL 21.8 12/18/2018 0737   LDLCALC 170 (H) 12/18/2018 0737     Wt Readings from Last 3 Encounters:  01/24/19 258 lb (117 kg)  01/24/19 253 lb (114.8 kg)  01/01/19 256 lb (116.1 kg)      Other studies Reviewed: Additional studies/ records that were reviewed today include: TTE 01/01/19  Review of the above records today demonstrates:   1. Left ventricular ejection fraction, by visual estimation, is 60 to 65%. The left ventricle has normal function. There is no left ventricular hypertrophy.  2. The left ventricle has no regional wall motion abnormalities.  3. Left ventricular diastolic parameters are consistent with Grade II diastolic dysfunction (pseudonormalization).  4. Global right ventricle has normal systolic function.The right ventricular size is normal. No increase in right ventricular wall thickness.  5. Left atrial size was normal.  6. Right atrial size was normal.  7. The mitral valve is normal in structure. Trace mitral valve regurgitation. No evidence of mitral stenosis.  8. The tricuspid valve is normal in structure. Tricuspid valve  regurgitation is trivial.  9. The aortic valve is tricuspid. Aortic valve regurgitation is not visualized. No evidence of aortic valve sclerosis or stenosis. 10. There is mild dilatation of the aortic root measuring 38 mm. 11. The inferior vena cava is normal in size with greater than 50% respiratory variability, suggesting right atrial pressure of 3 mmHg. 12. The tricuspid regurgitant velocity is 2.51 m/s, and with an assumed right atrial pressure of 3 mmHg, the estimated right ventricular systolic pressure is normal at 28.2 mmHg.   ASSESSMENT AND PLAN:  1.  Persistent atrial fibrillation: Currently on amiodarone, anticoagulation with Eliquis.  CHA2DS2-VASc of 1.  At this point, he would prefer to have ablation for his atrial fibrillation.  Risks and benefits were discussed include bleeding, tamponade, heart block, stroke, damage to chest organs.  He understands these risks and has agreed to the procedure.  2.  Hypertension: Currently well controlled  3.  Systolic heart failure: Likely due to a tachycardia induced cardiomyopathy.  Remains in sinus rhythm.  Ejection fraction is improved to normal.  Current medicines are reviewed at length with the patient today.   The patient does not have concerns regarding his medicines.  The following changes were made today:  none  Labs/ tests ordered today include:  No orders of the defined types were placed in this encounter.    Disposition:   FU with Tonnya Garbett 3 months  Signed, Denicia Pagliarulo Meredith Leeds, MD  01/24/2019 3:16 PM     [address] [address] [phone] (office) [phone] (fax)

## 2019-01-24 NOTE — Patient Instructions (Addendum)
Medication Instructions:  Your physician recommends that you continue on your current medications as directed. Please refer to the Current Medication list given to you today.  *If you need a refill on your cardiac medications before your next appointment, please call your pharmacy.  Labwork: You will get lab work within 30 days of your procedure:  BMP & CBC Please schedule lab work within 30 days of ___________ If you have labs (blood work) drawn today and your tests are completely normal, you will receive your results only by:  [place] (if you have MyChart) OR  A paper copy in the mail If you have any lab test that is abnormal or we need to change your treatment, we will call you to review the results.  Testing/Procedures: Your physician has requested that you have cardiac CT within 7 days prior to your ablation. Cardiac computed tomography (CT) is a painless test that uses an x-ray machine to take clear, detailed pictures of your heart. For further information please visit [email or web address]. Please follow instruction below located under special instructions. You will get a call from our office to schedule the date for this test.  Your physician has recommended that you have an ablation. Catheter ablation is a medical procedure used to treat some cardiac arrhythmias (irregular heartbeats). During catheter ablation, a long, thin, flexible tube is put into a blood vessel in your groin (upper thigh), or neck. This tube is called an ablation catheter. It is then guided to your heart through the blood vessel. Radio frequency waves destroy small areas of heart tissue where abnormal heartbeats may cause an arrhythmia to start. Please see the instructions below located under special instructions  Follow-Up: Your physician recommends that you schedule a follow-up appointment in: 4 weeks, after your procedure on 04/03/19, with Roderic Palau NP in the AFib clinic.  Your physician recommends  that you schedule a follow up appointment in: 3 months, after your procedure on 04/03/19, with Dr. Curt Bears.  Thank you for choosing [facility]!! Trinidad Curet, RN [phone]   Any Other Special Instructions Will Be Listed Below  THE NURSE WILL CALL YOU TO GO OVER INSTRUCTIONS AS PROCEDURE DATE NEARS.  CT INSTRUCTIONS Your cardiac CT will be scheduled at:  [facility] [address] [place], [place] [address] [phone]  Please arrive at the [facility] main entrance of [facility] at ____________ on ____________. Please arrive 30-45 minutes prior to test start time. Proceed to the [facility] Radiology Department (first floor) to check-in and test prep.  Please follow these instructions carefully (unless otherwise directed):  Hold all erectile dysfunction medications at least 48 hours prior to test.  On the Night Before the Test: . Be sure to Drink plenty of water. . Do not consume any caffeinated/decaffeinated beverages or chocolate 12 hours prior to your test. . Do not take any antihistamines 12 hours prior to your test.  On the Day of the Test: . Drink plenty of water. Do not drink any water within one hour of the test. . Do not eat any food 4 hours prior to the test. . You may take your regular medications prior to the test.  . Take your Diltiazem the morning of this test. . HOLD Furosemide/Hydrochlorothiazide morning of the test.      After the Test: . Drink plenty of water. . After receiving IV contrast, you may experience a mild flushed feeling. This is normal. . On occasion, you may experience a mild  rash up to 24 hours after the test. This is not dangerous. If this occurs, you can take Benadryl 25 mg and increase your fluid intake. . If you experience trouble breathing, this can be serious. If it is severe call 911 IMMEDIATELY. If it is mild, please call our office.  Once we have confirmed authorization from your insurance company, we  will call you to set up a date and time for your test.   For non-scheduling related questions, please contact the cardiac imaging nurse navigator should you have any questions/concerns: Marchia Bond, RN Navigator Cardiac Imaging [name] [phone] Office      Electrophysiology/Ablation Procedure Instructions   You are scheduled for a(n)  ablation on 04/03/2019 with Dr. Allegra Lai.   1.   Pre procedure testing-             A.  LAB WORK --- On __________ you will first go to the [facility] office (see address at the top of this letter) at _________ for your pre procedure blood work.                 B. COVID TEST-- On _________ @ _________ -  after you get your lab work- You will go to [facility] ([facility] for your Covid testing.   This is a drive thru test site.  There will be multiple testing areas.  Be sure to share with the first checkpoint that you are there for pre-procedure/surgery testing. This will put you into the right (yellow) lane that leads to the PAT testing team. Stay in your car and the nurse team will come to your car to test you.  After you are tested please go home and self quarantine until the day of your procedure.     2. On the day of your procedure 04/03/2019 you will go to [facility] [phone] [address]) at 6:30 am.  Dennis Bast will go to the main entrance A [employer]) and enter where the [employer] are.  Your driver will drop you off and you will head down the hallway to ADMITTING.  You may have one support person come in to the hospital with you.  They will be asked to wait in the waiting room.   3.   Do not eat or drink after midnight prior to your procedure.   4.   Do NOT take any medications the morning of your procedure.   5.  Plan for an overnight stay.  If you use your phone frequently bring your phone charger.   6. You will follow up with the AFIB clinic 4 weeks after your procedure.   You will follow up with Dr. Curt Bears  3 months after your procedure.  These appointments will be made for you.   * If you have ANY questions please call the office [phone] and ask for Sherri RN or send me a MyChart message   * Occasionally, EP Studies and ablations can become lengthy.  Please make your family aware of this before your procedure starts.  Average time ranges from 2-8 hours for EP studies/ablations.  Your physician will call your family after the procedure with the results.                                    Cardiac Ablation Cardiac ablation is a procedure to  disable (ablate) a small amount of heart tissue in very specific places. The heart has many electrical connections. Sometimes these connections are abnormal and can cause the heart to beat very fast or irregularly. Ablating some of the problem areas can improve the heart rhythm or return it to normal. Ablation may be done for people who:  Have Wolff-Parkinson-White syndrome.  Have fast heart rhythms (tachycardia).  Have taken medicines for an abnormal heart rhythm (arrhythmia) that were not effective or caused side effects.  Have a high-risk heartbeat that may be life-threatening. During the procedure, a small incision is made in the neck or the groin, and a long, thin, flexible tube (catheter) is inserted into the incision and moved to the heart. Small devices (electrodes) on the tip of the catheter will send out electrical currents. A type of X-ray (fluoroscopy) will be used to help guide the catheter and to provide images of the heart. Tell a health care provider about:  Any allergies you have.  All medicines you are taking, including vitamins, herbs, eye drops, creams, and over-the-counter medicines.  Any problems you or family members have had with anesthetic medicines.  Any blood disorders you have.  Any surgeries you have had.  Any medical conditions you have, such as kidney failure.  Whether you are  pregnant or may be pregnant. What are the risks? Generally, this is a safe procedure. However, problems may occur, including:  Infection.  Bruising and bleeding at the catheter insertion site.  Bleeding into the chest, especially into the sac that surrounds the heart. This is a serious complication.  Stroke or blood clots.  Damage to other structures or organs.  Allergic reaction to medicines or dyes.  Need for a permanent pacemaker if the normal electrical system is damaged. A pacemaker is a small computer that sends electrical signals to the heart and helps your heart beat normally.  The procedure not being fully effective. This may not be recognized until months later. Repeat ablation procedures are sometimes required. What happens before the procedure?  Follow instructions from your health care provider about eating or drinking restrictions.  Ask your health care provider about: ? Changing or stopping your regular medicines. This is especially important if you are taking diabetes medicines or blood thinners. ? Taking medicines such as aspirin and ibuprofen. These medicines can thin your blood. Do not take these medicines before your procedure if your health care provider instructs you not to.  Plan to have someone take you home from the hospital or clinic.  If you will be going home right after the procedure, plan to have someone with you for 24 hours. What happens during the procedure?  To lower your risk of infection: ? Your health care team will wash or sanitize their hands. ? Your skin will be washed with soap. ? Hair may be removed from the incision area.  An IV tube will be inserted into one of your veins.  You will be given a medicine to help you relax (sedative).  The skin on your neck or groin will be numbed.  An incision will be made in your neck or your groin.  A needle will be inserted through the incision and into a large vein in your neck or groin.  A  catheter will be inserted into the needle and moved to your heart.  Dye may be injected through the catheter to help your surgeon see the area of the heart that needs treatment.  Electrical currents will  be sent from the catheter to ablate heart tissue in desired areas. There are three types of energy that may be used to ablate heart tissue: ? Heat (radiofrequency energy). ? Laser energy. ? Extreme cold (cryoablation).  When the necessary tissue has been ablated, the catheter will be removed.  Pressure will be held on the catheter insertion area to prevent excessive bleeding.  A bandage (dressing) will be placed over the catheter insertion area. The procedure may vary among health care providers and hospitals. What happens after the procedure?  Your blood pressure, heart rate, breathing rate, and blood oxygen level will be monitored until the medicines you were given have worn off.  Your catheter insertion area will be monitored for bleeding. You will need to lie still for a few hours to ensure that you do not bleed from the catheter insertion area.  Do not drive for 24 hours or as long as directed by your health care provider. Summary  Cardiac ablation is a procedure to disable (ablate) a small amount of heart tissue in very specific places. Ablating some of the problem areas can improve the heart rhythm or return it to normal.  During the procedure, electrical currents will be sent from the catheter to ablate heart tissue in desired areas. This information is not intended to replace advice given to you by your health care provider. Make sure you discuss any questions you have with your health care provider. Document Released: 06/12/2008 Document Revised: 07/17/2017 Document Reviewed: 12/14/2015 Elsevier Patient Education  2020 [employer].

## 2019-01-25 ENCOUNTER — Encounter (HOSPITAL_COMMUNITY)
Admission: RE | Admit: 2019-01-25 | Discharge: 2019-01-25 | Disposition: A | Discharge status: Home / Self Care | Payer: BC Managed Care – PPO | Source: Ambulatory Visit | Attending: Orthopedic Surgery | Admitting: Orthopedic Surgery

## 2019-01-25 DIAGNOSIS — I5032 Chronic diastolic (congestive) heart failure: Secondary | ICD-10-CM | POA: Diagnosis not present

## 2019-01-25 DIAGNOSIS — Z8249 Family history of ischemic heart disease and other diseases of the circulatory system: Secondary | ICD-10-CM | POA: Diagnosis not present

## 2019-01-25 DIAGNOSIS — G8918 Other acute postprocedural pain: Secondary | ICD-10-CM | POA: Diagnosis not present

## 2019-01-25 DIAGNOSIS — M1711 Unilateral primary osteoarthritis, right knee: Secondary | ICD-10-CM | POA: Diagnosis not present

## 2019-01-25 DIAGNOSIS — Z20828 Contact with and (suspected) exposure to other viral communicable diseases: Secondary | ICD-10-CM | POA: Diagnosis not present

## 2019-01-25 DIAGNOSIS — N400 Enlarged prostate without lower urinary tract symptoms: Secondary | ICD-10-CM | POA: Diagnosis present

## 2019-01-25 DIAGNOSIS — J438 Other emphysema: Secondary | ICD-10-CM | POA: Diagnosis not present

## 2019-01-25 DIAGNOSIS — Z833 Family history of diabetes mellitus: Secondary | ICD-10-CM | POA: Diagnosis not present

## 2019-01-25 DIAGNOSIS — E785 Hyperlipidemia, unspecified: Secondary | ICD-10-CM | POA: Diagnosis not present

## 2019-01-25 DIAGNOSIS — Z885 Allergy status to narcotic agent status: Secondary | ICD-10-CM | POA: Diagnosis not present

## 2019-01-25 DIAGNOSIS — I428 Other cardiomyopathies: Secondary | ICD-10-CM | POA: Diagnosis not present

## 2019-01-25 DIAGNOSIS — Z6836 Body mass index (BMI) 36.0-36.9, adult: Secondary | ICD-10-CM | POA: Diagnosis not present

## 2019-01-25 DIAGNOSIS — G8929 Other chronic pain: Secondary | ICD-10-CM | POA: Diagnosis not present

## 2019-01-25 DIAGNOSIS — I110 Hypertensive heart disease with heart failure: Secondary | ICD-10-CM | POA: Diagnosis not present

## 2019-01-25 DIAGNOSIS — M25761 Osteophyte, right knee: Secondary | ICD-10-CM | POA: Diagnosis not present

## 2019-01-25 DIAGNOSIS — Z79899 Other long term (current) drug therapy: Secondary | ICD-10-CM | POA: Diagnosis not present

## 2019-01-25 DIAGNOSIS — E669 Obesity, unspecified: Secondary | ICD-10-CM | POA: Diagnosis not present

## 2019-01-25 DIAGNOSIS — Z800 Family history of malignant neoplasm of digestive organs: Secondary | ICD-10-CM | POA: Diagnosis not present

## 2019-01-25 DIAGNOSIS — Z888 Allergy status to other drugs, medicaments and biological substances status: Secondary | ICD-10-CM | POA: Diagnosis not present

## 2019-01-25 DIAGNOSIS — Z7901 Long term (current) use of anticoagulants: Secondary | ICD-10-CM | POA: Diagnosis not present

## 2019-01-25 DIAGNOSIS — Z981 Arthrodesis status: Secondary | ICD-10-CM | POA: Diagnosis not present

## 2019-01-25 DIAGNOSIS — I4891 Unspecified atrial fibrillation: Secondary | ICD-10-CM | POA: Diagnosis not present

## 2019-01-25 DIAGNOSIS — Z801 Family history of malignant neoplasm of trachea, bronchus and lung: Secondary | ICD-10-CM | POA: Diagnosis not present

## 2019-01-25 DIAGNOSIS — Z7951 Long term (current) use of inhaled steroids: Secondary | ICD-10-CM | POA: Diagnosis not present

## 2019-01-25 DIAGNOSIS — I5033 Acute on chronic diastolic (congestive) heart failure: Secondary | ICD-10-CM | POA: Diagnosis not present

## 2019-01-25 DIAGNOSIS — F17210 Nicotine dependence, cigarettes, uncomplicated: Secondary | ICD-10-CM | POA: Diagnosis present

## 2019-01-25 LAB — COMPREHENSIVE METABOLIC PANEL
ALT: 22 U/L (ref 0–44)
AST: 16 U/L (ref 15–41)
Albumin: 3.5 g/dL (ref 3.5–5.0)
Alkaline Phosphatase: 60 U/L (ref 38–126)
Anion gap: 9 (ref 5–15)
BUN: 14 mg/dL (ref 8–23)
CO2: 24 mmol/L (ref 22–32)
Calcium: 8.5 mg/dL — ABNORMAL LOW (ref 8.9–10.3)
Chloride: 105 mmol/L (ref 98–111)
Creatinine, Ser: 0.94 mg/dL (ref 0.61–1.24)
GFR calc Af Amer: >60 mL/min (ref >60)
GFR calc non Af Amer: >60 mL/min (ref >60)
Glucose, Bld: 106 mg/dL — ABNORMAL HIGH (ref 70–99)
Potassium: 4.4 mmol/L (ref 3.5–5.1)
Sodium: 138 mmol/L (ref 135–145)
Total Bilirubin: 1.1 mg/dL (ref 0.3–1.2)
Total Protein: 6.6 g/dL (ref 6.5–8.1)

## 2019-01-25 LAB — APTT: aPTT: 32 seconds (ref 24–36)

## 2019-01-25 LAB — SURGICAL PCR SCREEN
MRSA, PCR: NEGATIVE
Staphylococcus aureus: NEGATIVE

## 2019-01-25 LAB — CBC
HCT: 48.7 % (ref 39.0–52.0)
Hemoglobin: 16.1 g/dL (ref 13.0–17.0)
MCH: 32.7 pg (ref 26.0–34.0)
MCHC: 33.1 g/dL (ref 30.0–36.0)
MCV: 99 fL (ref 80.0–100.0)
Platelets: 201 10*3/uL (ref 150–400)
RBC: 4.92 MIL/uL (ref 4.22–5.81)
RDW: 13.2 % (ref 11.5–15.5)
WBC: 6.9 10*3/uL (ref 4.0–10.5)
nRBC: 0 % (ref 0.0–0.2)

## 2019-01-25 LAB — ABO/RH: ABO/RH(D): O POS

## 2019-01-25 LAB — PROTIME-INR
INR: 0.9 (ref 0.8–1.2)
Prothrombin Time: 12.4 seconds (ref 11.4–15.2)

## 2019-01-25 LAB — NOVEL CORONAVIRUS, NAA (HOSP ORDER, SEND-OUT TO REF LAB; TAT 18-24 HRS): SARS-CoV-2, NAA: NOT DETECTED

## 2019-01-25 NOTE — Anesthesia Preprocedure Evaluation (Addendum)
Anesthesia Evaluation  Patient identified by MRN, date of birth, ID band Patient awake    Reviewed: Allergy & Precautions, NPO status , Patient's Chart, lab work & pertinent test results  History of Anesthesia Complications Negative for: history of anesthetic complications  Airway Mallampati: III  TM Distance: >3 FB Neck ROM: Full    Dental  (+) Dental Advisory Given, Teeth Intact   Pulmonary neg recent URI, Current Smoker and Patient abstained from smoking.,    breath sounds clear to auscultation       Cardiovascular hypertension, Pt. on medications +CHF  + dysrhythmias Atrial Fibrillation  Rhythm:Regular  tte 2020:   1. Left ventricular ejection fraction, by visual estimation, is 60 to 65%. The left ventricle has normal function. There is no left ventricular hypertrophy.  2. The left ventricle has no regional wall motion abnormalities.  3. Left ventricular diastolic parameters are consistent with Grade II diastolic dysfunction (pseudonormalization).  4. Global right ventricle has normal systolic function.The right ventricular size is normal. No increase in right ventricular wall thickness.  5. Left atrial size was normal.  6. Right atrial size was normal.  7. The mitral valve is normal in structure. Trace mitral valve regurgitation. No evidence of mitral stenosis.  8. The tricuspid valve is normal in structure. Tricuspid valve regurgitation is trivial.  9. The aortic valve is tricuspid. Aortic valve regurgitation is not visualized. No evidence of aortic valve sclerosis or stenosis. 10. There is mild dilatation of the aortic root measuring 38 mm. 11. The inferior vena cava is normal in size with greater than 50% respiratory variability, suggesting right atrial pressure of 3 mmHg. 12. The tricuspid regurgitant velocity is 2.51 m/s, and with an assumed right atrial pressure of 3 mmHg, the estimated right ventricular systolic pressure  is normal at 28.2 mmHg.   Neuro/Psych    GI/Hepatic negative GI ROS, Neg liver ROS,   Endo/Other    Renal/GU negative Renal ROS     Musculoskeletal  (+) Arthritis ,   Abdominal   Peds  Hematology Last eliquis dose 12/17   Anesthesia Other Findings   Reproductive/Obstetrics                           Anesthesia Physical Anesthesia Plan  ASA: II  Anesthesia Plan: MAC, Regional and Spinal   Post-op Pain Management:  Regional for Post-op pain   Induction: Intravenous  PONV Risk Score and Plan: Treatment may vary due to age or medical condition and Propofol infusion  Airway Management Planned: Nasal Cannula  Additional Equipment: None  Intra-op Plan:   Post-operative Plan:   Informed Consent: I have reviewed the patients History and Physical, chart, labs and discussed the procedure including the risks, benefits and alternatives for the proposed anesthesia with the patient or authorized representative who has indicated his/her understanding and acceptance.     Dental advisory given  Plan Discussed with: CRNA and Surgeon  Anesthesia Plan Comments: (See PAT note 01/24/2019, Konrad Felix, PA-C)       Anesthesia Quick Evaluation

## 2019-01-25 NOTE — Progress Notes (Signed)
Anesthesia Chart Review   Case: [id number] Date/Time: 01/28/19 1217   Procedure: TOTAL KNEE ARTHROPLASTY (Right Knee)   Anesthesia type: Choice   Pre-op diagnosis: right knee osteoarthritis   Location: [facility] ROOM 10 / [facility]   Surgeons: Gaynelle Arabian, MD      DISCUSSION:63 y.o. current some day smoker (45 pack years) with h/o HTN, HLD, atrial fibrillation (on Eliquis), diastolic heart failure, right knee OA scheduled for above procedure 01/28/2019 with Dr. Gaynelle Arabian.   Pt last seen by cardiologist, Dr. Cristopher Peru, 01/01/2019.  Per OV note, "1. Persistent atrial fib - he is maintaining NSR and I have asked him to reduce his dose of amiodarone to 200 mg daily. I have recommended her go to see Dr. Curt Bears to consider catheter ablation.  2. Preoperative eval - his repeat echo is pending and I suspect it to be improved. He may proceed with knee replacement surgery as he would be low risk. 3. Combined systolic/diastolic heart failure - his symptoms are well controlled now that he is back in rhythm. I suspect his EF will have normalized. 4. HTN - his sbp is a little high. He is encouraged to lose weight and reduce his salt intake."  Advised to hold Eliquis 3 days prior to procedure.   Seen by electrophysiologist, Dr. Allegra Lai, 01/24/2019.  Planning for ablation.   Anticipate pt can proceed with planned procedure barring acute status change.   VS: BP (!) 149/75   Pulse 67   Temp 36.7 C (Oral)   Resp 18   Ht 5\' 10"  (1.778 m)   Wt 114.8 kg   SpO2 96%   BMI 36.30 kg/m   PROVIDERS: [name], DO is PCP   Allegra Lai, MD is Electrophysiologist   Cristopher Peru, MD is Cardiologist  LABS: Labs reviewed: Acceptable for surgery. (all labs ordered are listed, but only abnormal results are displayed)  Labs Reviewed  SURGICAL PCR SCREEN  TYPE AND SCREEN     IMAGES:   EKG: 01/01/2019 Rate 64 bpm Normal sinus rhythm   CV: Echo 01/01/2019 IMPRESSIONS   1.  Left ventricular ejection fraction, by visual estimation, is 60 to 65%. The left ventricle has normal function. There is no left ventricular hypertrophy.  2. The left ventricle has no regional wall motion abnormalities.  3. Left ventricular diastolic parameters are consistent with Grade II diastolic dysfunction (pseudonormalization).  4. Global right ventricle has normal systolic function.The right ventricular size is normal. No increase in right ventricular wall thickness.  5. Left atrial size was normal.  6. Right atrial size was normal.  7. The mitral valve is normal in structure. Trace mitral valve regurgitation. No evidence of mitral stenosis.  8. The tricuspid valve is normal in structure. Tricuspid valve regurgitation is trivial.  9. The aortic valve is tricuspid. Aortic valve regurgitation is not visualized. No evidence of aortic valve sclerosis or stenosis. 10. There is mild dilatation of the aortic root measuring 38 mm. 11. The inferior vena cava is normal in size with greater than 50% respiratory variability, suggesting right atrial pressure of 3 mmHg. 12. The tricuspid regurgitant velocity is 2.51 m/s, and with an assumed right atrial pressure of 3 mmHg, the estimated right ventricular systolic pressure is normal at 28.2 mmHg. Past Medical History:  Diagnosis Date  . Acute diastolic CHF (congestive heart failure) ([place]) 05/29/2018  . Arthritis    left hip  . Atrial fibrillation ([place])   . Hyperlipidemia   . Hypertension   .  Tubular adenoma of colon 11/2011    Past Surgical History:  Procedure Laterality Date  . BACK SURGERY    . CARDIOVERSION N/A 06/25/2018   Procedure: CARDIOVERSION (CATH LAB);  Surgeon: Evans Lance, MD;  Location: [facility];  Service: Cardiovascular;  Laterality: N/A;  . HERNIA REPAIR    . SPINE SURGERY     cervical fusion c6, c7 (1994), and c3,4,5,6 (2004)  . WISDOM TOOTH EXTRACTION      MEDICATIONS: . albuterol (VENTOLIN HFA) 108 (90 Base)  MCG/ACT inhaler  . amiodarone (PACERONE) 200 MG tablet  . apixaban (ELIQUIS) 5 MG TABS tablet  . diltiazem (CARDIZEM) 120 MG tablet  . fluticasone (FLONASE) 50 MCG/ACT nasal spray  . furosemide (LASIX) 40 MG tablet  . metoprolol succinate (TOPROL XL) 25 MG 24 hr tablet  . OVER THE COUNTER MEDICATION  . SYMBICORT 80-4.5 MCG/ACT inhaler  . umeclidinium bromide (INCRUSE ELLIPTA) 62.5 MCG/INH AEPB  . varenicline (CHANTIX PAK) 0.5 MG X 11 & 1 MG X 42 tablet   No current facility-administered medications for this encounter.    Maia Plan [facility] Pre-Surgical Testing [phone] 01/25/19  11:02 AM

## 2019-01-27 MED ORDER — BUPIVACAINE LIPOSOME 1.3 % IJ SUSP
20.0000 mL | Freq: Once | INTRAMUSCULAR | Status: DC
  Filled 2019-01-27: qty 20

## 2019-01-28 ENCOUNTER — Inpatient Hospital Stay (HOSPITAL_COMMUNITY): Payer: BC Managed Care – PPO | Admitting: Anesthesiology

## 2019-01-28 ENCOUNTER — Inpatient Hospital Stay (HOSPITAL_COMMUNITY)
Admission: RE | Admit: 2019-01-28 | Discharge: 2019-01-29 | DRG: 470 | Disposition: A | Discharge status: Home / Self Care | Payer: BC Managed Care – PPO | Attending: Orthopedic Surgery | Admitting: Orthopedic Surgery

## 2019-01-28 ENCOUNTER — Encounter (HOSPITAL_COMMUNITY)
Admission: RE | Disposition: A | Discharge status: Home / Self Care | Payer: Self-pay | Source: Home / Self Care | Attending: Orthopedic Surgery

## 2019-01-28 ENCOUNTER — Encounter (HOSPITAL_COMMUNITY): Payer: Self-pay | Admitting: Orthopedic Surgery

## 2019-01-28 ENCOUNTER — Other Ambulatory Visit: Payer: Self-pay

## 2019-01-28 ENCOUNTER — Inpatient Hospital Stay (HOSPITAL_COMMUNITY): Payer: BC Managed Care – PPO | Admitting: Physician Assistant

## 2019-01-28 DIAGNOSIS — I428 Other cardiomyopathies: Secondary | ICD-10-CM | POA: Diagnosis present

## 2019-01-28 DIAGNOSIS — I110 Hypertensive heart disease with heart failure: Secondary | ICD-10-CM | POA: Diagnosis present

## 2019-01-28 DIAGNOSIS — E785 Hyperlipidemia, unspecified: Secondary | ICD-10-CM | POA: Diagnosis present

## 2019-01-28 DIAGNOSIS — Z833 Family history of diabetes mellitus: Secondary | ICD-10-CM | POA: Diagnosis not present

## 2019-01-28 DIAGNOSIS — Z8249 Family history of ischemic heart disease and other diseases of the circulatory system: Secondary | ICD-10-CM

## 2019-01-28 DIAGNOSIS — Z888 Allergy status to other drugs, medicaments and biological substances status: Secondary | ICD-10-CM | POA: Diagnosis not present

## 2019-01-28 DIAGNOSIS — M1711 Unilateral primary osteoarthritis, right knee: Secondary | ICD-10-CM | POA: Diagnosis present

## 2019-01-28 DIAGNOSIS — J438 Other emphysema: Secondary | ICD-10-CM | POA: Diagnosis present

## 2019-01-28 DIAGNOSIS — I4891 Unspecified atrial fibrillation: Secondary | ICD-10-CM | POA: Diagnosis present

## 2019-01-28 DIAGNOSIS — E669 Obesity, unspecified: Secondary | ICD-10-CM | POA: Diagnosis present

## 2019-01-28 DIAGNOSIS — G4700 Insomnia, unspecified: Secondary | ICD-10-CM | POA: Diagnosis present

## 2019-01-28 DIAGNOSIS — Z6836 Body mass index (BMI) 36.0-36.9, adult: Secondary | ICD-10-CM | POA: Diagnosis not present

## 2019-01-28 DIAGNOSIS — G8929 Other chronic pain: Secondary | ICD-10-CM | POA: Diagnosis present

## 2019-01-28 DIAGNOSIS — M1710 Unilateral primary osteoarthritis, unspecified knee: Secondary | ICD-10-CM | POA: Diagnosis present

## 2019-01-28 DIAGNOSIS — Z7951 Long term (current) use of inhaled steroids: Secondary | ICD-10-CM | POA: Diagnosis not present

## 2019-01-28 DIAGNOSIS — Z800 Family history of malignant neoplasm of digestive organs: Secondary | ICD-10-CM | POA: Diagnosis not present

## 2019-01-28 DIAGNOSIS — I5032 Chronic diastolic (congestive) heart failure: Secondary | ICD-10-CM | POA: Diagnosis present

## 2019-01-28 DIAGNOSIS — F17210 Nicotine dependence, cigarettes, uncomplicated: Secondary | ICD-10-CM | POA: Diagnosis present

## 2019-01-28 DIAGNOSIS — Z885 Allergy status to narcotic agent status: Secondary | ICD-10-CM | POA: Diagnosis not present

## 2019-01-28 DIAGNOSIS — N400 Enlarged prostate without lower urinary tract symptoms: Secondary | ICD-10-CM | POA: Diagnosis present

## 2019-01-28 DIAGNOSIS — Z801 Family history of malignant neoplasm of trachea, bronchus and lung: Secondary | ICD-10-CM | POA: Diagnosis not present

## 2019-01-28 DIAGNOSIS — Z79899 Other long term (current) drug therapy: Secondary | ICD-10-CM

## 2019-01-28 DIAGNOSIS — Z20828 Contact with and (suspected) exposure to other viral communicable diseases: Secondary | ICD-10-CM | POA: Diagnosis present

## 2019-01-28 DIAGNOSIS — Z7901 Long term (current) use of anticoagulants: Secondary | ICD-10-CM | POA: Diagnosis not present

## 2019-01-28 DIAGNOSIS — M25761 Osteophyte, right knee: Secondary | ICD-10-CM | POA: Diagnosis present

## 2019-01-28 DIAGNOSIS — M179 Osteoarthritis of knee, unspecified: Secondary | ICD-10-CM | POA: Diagnosis present

## 2019-01-28 DIAGNOSIS — Z981 Arthrodesis status: Secondary | ICD-10-CM | POA: Diagnosis not present

## 2019-01-28 HISTORY — PX: TOTAL KNEE ARTHROPLASTY: SHX125

## 2019-01-28 LAB — TYPE AND SCREEN
ABO/RH(D): O POS
Antibody Screen: NEGATIVE

## 2019-01-28 SURGERY — ARTHROPLASTY, KNEE, TOTAL
Anesthesia: Monitor Anesthesia Care | Site: Knee | Laterality: Right

## 2019-01-28 MED ORDER — TRAMADOL HCL 50 MG PO TABS
50.0000 mg | ORAL_TABLET | Freq: Four times a day (QID) | ORAL | Status: DC | PRN
  Administered 2019-01-28 – 2019-01-29 (×2): 100 mg via ORAL
  Filled 2019-01-28 (×2): qty 2

## 2019-01-28 MED ORDER — STERILE WATER FOR IRRIGATION IR SOLN
Status: DC | PRN
  Administered 2019-01-28: 2000 mL

## 2019-01-28 MED ORDER — MORPHINE SULFATE (PF) 4 MG/ML IV SOLN
INTRAVENOUS | Status: AC
  Filled 2019-01-28: qty 1

## 2019-01-28 MED ORDER — DIPHENHYDRAMINE HCL 12.5 MG/5ML PO ELIX: 12.5000 mg | ORAL_SOLUTION | ORAL | Status: DC | PRN

## 2019-01-28 MED ORDER — SODIUM CHLORIDE (PF) 0.9 % IJ SOLN
INTRAMUSCULAR | Status: AC
  Filled 2019-01-28: qty 10

## 2019-01-28 MED ORDER — SODIUM CHLORIDE (PF) 0.9 % IJ SOLN
INTRAMUSCULAR | Status: AC
  Filled 2019-01-28: qty 50

## 2019-01-28 MED ORDER — MEPIVACAINE HCL (PF) 2 % IJ SOLN
INTRAMUSCULAR | Status: DC | PRN
  Administered 2019-01-28: 2.5 mg via INTRATHECAL

## 2019-01-28 MED ORDER — 0.9 % SODIUM CHLORIDE (POUR BTL) OPTIME
TOPICAL | Status: DC | PRN
  Administered 2019-01-28: 14:00:00 1000 mL

## 2019-01-28 MED ORDER — BUPIVACAINE LIPOSOME 1.3 % IJ SUSP
INTRAMUSCULAR | Status: DC | PRN
  Administered 2019-01-28: 20 mL

## 2019-01-28 MED ORDER — CEFAZOLIN SODIUM-DEXTROSE 2-4 GM/100ML-% IV SOLN
2.0000 g | INTRAVENOUS | Status: AC
  Administered 2019-01-28: 2 g via INTRAVENOUS
  Filled 2019-01-28: qty 100

## 2019-01-28 MED ORDER — DEXAMETHASONE SODIUM PHOSPHATE 10 MG/ML IJ SOLN
8.0000 mg | Freq: Once | INTRAMUSCULAR | Status: AC
  Administered 2019-01-28: 8 mg via INTRAVENOUS

## 2019-01-28 MED ORDER — METHOCARBAMOL 500 MG PO TABS
500.0000 mg | ORAL_TABLET | Freq: Four times a day (QID) | ORAL | Status: DC | PRN
  Administered 2019-01-29 (×2): 500 mg via ORAL
  Filled 2019-01-28 (×3): qty 1

## 2019-01-28 MED ORDER — DILTIAZEM HCL 60 MG PO TABS
120.0000 mg | ORAL_TABLET | Freq: Every day | ORAL | Status: DC
  Administered 2019-01-29: 120 mg via ORAL
  Filled 2019-01-28 (×2): qty 2

## 2019-01-28 MED ORDER — METOCLOPRAMIDE HCL 5 MG/ML IJ SOLN: 5.0000 mg | Freq: Three times a day (TID) | INTRAMUSCULAR | Status: DC | PRN

## 2019-01-28 MED ORDER — PHENOL 1.4 % MT LIQD: 1.0000 | OROMUCOSAL | Status: DC | PRN

## 2019-01-28 MED ORDER — FLUTICASONE PROPIONATE 50 MCG/ACT NA SUSP: 2.0000 | NASAL | Status: DC | PRN

## 2019-01-28 MED ORDER — METHOCARBAMOL 500 MG IVPB - SIMPLE MED
INTRAVENOUS | Status: AC
  Filled 2019-01-28: qty 50

## 2019-01-28 MED ORDER — POVIDONE-IODINE 10 % EX SWAB
2.0000 "application " | Freq: Once | CUTANEOUS | Status: AC
  Administered 2019-01-28: 2 via TOPICAL

## 2019-01-28 MED ORDER — ACETAMINOPHEN 500 MG PO TABS
1000.0000 mg | ORAL_TABLET | Freq: Four times a day (QID) | ORAL | Status: AC
  Administered 2019-01-28 – 2019-01-29 (×4): 1000 mg via ORAL
  Filled 2019-01-28 (×4): qty 2

## 2019-01-28 MED ORDER — ROPIVACAINE HCL 7.5 MG/ML IJ SOLN
INTRAMUSCULAR | Status: DC | PRN
  Administered 2019-01-28: 20 mL via PERINEURAL

## 2019-01-28 MED ORDER — FLEET ENEMA 7-19 GM/118ML RE ENEM: 1.0000 | ENEMA | Freq: Once | RECTAL | Status: DC | PRN

## 2019-01-28 MED ORDER — ONDANSETRON HCL 4 MG PO TABS: 4.0000 mg | ORAL_TABLET | Freq: Four times a day (QID) | ORAL | Status: DC | PRN

## 2019-01-28 MED ORDER — OXYCODONE HCL 5 MG/5ML PO SOLN: 5.0000 mg | Freq: Once | ORAL | Status: AC | PRN

## 2019-01-28 MED ORDER — CHLORHEXIDINE GLUCONATE 4 % EX LIQD: 60.0000 mL | Freq: Once | CUTANEOUS | Status: DC

## 2019-01-28 MED ORDER — TRANEXAMIC ACID-NACL 1000-0.7 MG/100ML-% IV SOLN
1000.0000 mg | INTRAVENOUS | Status: AC
  Administered 2019-01-28: 14:00:00 1000 mg via INTRAVENOUS
  Filled 2019-01-28: qty 100

## 2019-01-28 MED ORDER — AMIODARONE HCL 200 MG PO TABS
200.0000 mg | ORAL_TABLET | Freq: Every day | ORAL | Status: DC
  Administered 2019-01-29: 200 mg via ORAL
  Filled 2019-01-28: qty 1

## 2019-01-28 MED ORDER — METOPROLOL SUCCINATE ER 25 MG PO TB24
25.0000 mg | ORAL_TABLET | Freq: Every day | ORAL | Status: DC
  Administered 2019-01-28: 25 mg via ORAL
  Filled 2019-01-28: qty 1

## 2019-01-28 MED ORDER — CEFAZOLIN SODIUM-DEXTROSE 2-4 GM/100ML-% IV SOLN
2.0000 g | Freq: Four times a day (QID) | INTRAVENOUS | Status: AC
  Administered 2019-01-28 – 2019-01-29 (×2): 2 g via INTRAVENOUS
  Filled 2019-01-28 (×2): qty 100

## 2019-01-28 MED ORDER — ALBUTEROL SULFATE (2.5 MG/3ML) 0.083% IN NEBU: 3.0000 mL | INHALATION_SOLUTION | RESPIRATORY_TRACT | Status: DC | PRN

## 2019-01-28 MED ORDER — SODIUM CHLORIDE (PF) 0.9 % IJ SOLN
INTRAMUSCULAR | Status: DC | PRN
  Administered 2019-01-28: 60 mL

## 2019-01-28 MED ORDER — MIDAZOLAM HCL 2 MG/2ML IJ SOLN
1.0000 mg | INTRAMUSCULAR | Status: DC
  Administered 2019-01-28: 1 mg via INTRAVENOUS
  Filled 2019-01-28: qty 2

## 2019-01-28 MED ORDER — BISACODYL 10 MG RE SUPP: 10.0000 mg | Freq: Every day | RECTAL | Status: DC | PRN

## 2019-01-28 MED ORDER — SODIUM CHLORIDE 0.9 % IR SOLN
Status: DC | PRN
  Administered 2019-01-28: 1000 mL

## 2019-01-28 MED ORDER — EPHEDRINE SULFATE 50 MG/ML IJ SOLN
INTRAMUSCULAR | Status: DC | PRN
  Administered 2019-01-28: 10 mg via INTRAVENOUS

## 2019-01-28 MED ORDER — ONDANSETRON HCL 4 MG/2ML IJ SOLN
INTRAMUSCULAR | Status: DC | PRN
  Administered 2019-01-28: 4 mg via INTRAVENOUS

## 2019-01-28 MED ORDER — PROPOFOL 500 MG/50ML IV EMUL
INTRAVENOUS | Status: DC | PRN
  Administered 2019-01-28: 30 mg via INTRAVENOUS
  Administered 2019-01-28 (×2): 20 mg via INTRAVENOUS

## 2019-01-28 MED ORDER — FUROSEMIDE 40 MG PO TABS
40.0000 mg | ORAL_TABLET | Freq: Two times a day (BID) | ORAL | Status: DC
  Administered 2019-01-29: 40 mg via ORAL
  Filled 2019-01-28: qty 1

## 2019-01-28 MED ORDER — DEXAMETHASONE SODIUM PHOSPHATE 10 MG/ML IJ SOLN
10.0000 mg | Freq: Once | INTRAMUSCULAR | Status: DC
  Filled 2019-01-28: qty 1

## 2019-01-28 MED ORDER — ACETAMINOPHEN 10 MG/ML IV SOLN
1000.0000 mg | Freq: Four times a day (QID) | INTRAVENOUS | Status: DC
  Administered 2019-01-28: 1000 mg via INTRAVENOUS
  Filled 2019-01-28: qty 100

## 2019-01-28 MED ORDER — SODIUM CHLORIDE 0.9 % IV SOLN: INTRAVENOUS | Status: DC

## 2019-01-28 MED ORDER — DOCUSATE SODIUM 100 MG PO CAPS
100.0000 mg | ORAL_CAPSULE | Freq: Two times a day (BID) | ORAL | Status: DC
  Administered 2019-01-28 – 2019-01-29 (×2): 100 mg via ORAL
  Filled 2019-01-28 (×2): qty 1

## 2019-01-28 MED ORDER — FENTANYL CITRATE (PF) 100 MCG/2ML IJ SOLN
INTRAMUSCULAR | Status: AC
  Filled 2019-01-28: qty 2

## 2019-01-28 MED ORDER — OXYCODONE HCL 5 MG PO TABS
5.0000 mg | ORAL_TABLET | Freq: Once | ORAL | Status: AC | PRN
  Administered 2019-01-28: 5 mg via ORAL

## 2019-01-28 MED ORDER — APIXABAN 2.5 MG PO TABS
2.5000 mg | ORAL_TABLET | Freq: Two times a day (BID) | ORAL | Status: DC
  Administered 2019-01-29: 2.5 mg via ORAL
  Filled 2019-01-28: qty 1

## 2019-01-28 MED ORDER — METOCLOPRAMIDE HCL 5 MG PO TABS: 5.0000 mg | ORAL_TABLET | Freq: Three times a day (TID) | ORAL | Status: DC | PRN

## 2019-01-28 MED ORDER — OXYCODONE HCL 5 MG PO TABS
ORAL_TABLET | ORAL | Status: AC
  Filled 2019-01-28: qty 2

## 2019-01-28 MED ORDER — OXYCODONE HCL 5 MG PO TABS
ORAL_TABLET | ORAL | Status: AC
  Filled 2019-01-28: qty 1

## 2019-01-28 MED ORDER — FENTANYL CITRATE (PF) 100 MCG/2ML IJ SOLN
100.0000 ug | INTRAMUSCULAR | Status: DC
  Administered 2019-01-28: 50 ug via INTRAVENOUS
  Filled 2019-01-28: qty 2

## 2019-01-28 MED ORDER — ACETAMINOPHEN 160 MG/5ML PO SOLN: 1000.0000 mg | Freq: Once | ORAL | Status: DC | PRN

## 2019-01-28 MED ORDER — PROPOFOL 500 MG/50ML IV EMUL
INTRAVENOUS | Status: DC | PRN
  Administered 2019-01-28: 50 ug/kg/min via INTRAVENOUS

## 2019-01-28 MED ORDER — LACTATED RINGERS IV SOLN: INTRAVENOUS | Status: DC

## 2019-01-28 MED ORDER — FENTANYL CITRATE (PF) 100 MCG/2ML IJ SOLN
INTRAMUSCULAR | Status: DC | PRN
  Administered 2019-01-28 (×2): 25 ug via INTRAVENOUS

## 2019-01-28 MED ORDER — MORPHINE SULFATE (PF) 4 MG/ML IV SOLN
1.0000 mg | INTRAVENOUS | Status: DC | PRN
  Administered 2019-01-28: 1 mg via INTRAVENOUS

## 2019-01-28 MED ORDER — ACETAMINOPHEN 10 MG/ML IV SOLN: 1000.0000 mg | Freq: Once | INTRAVENOUS | Status: DC | PRN

## 2019-01-28 MED ORDER — MENTHOL 3 MG MT LOZG: 1.0000 | LOZENGE | OROMUCOSAL | Status: DC | PRN

## 2019-01-28 MED ORDER — ONDANSETRON HCL 4 MG/2ML IJ SOLN: 4.0000 mg | Freq: Four times a day (QID) | INTRAMUSCULAR | Status: DC | PRN

## 2019-01-28 MED ORDER — FENTANYL CITRATE (PF) 100 MCG/2ML IJ SOLN: 25.0000 ug | INTRAMUSCULAR | Status: DC | PRN

## 2019-01-28 MED ORDER — GABAPENTIN 300 MG PO CAPS
300.0000 mg | ORAL_CAPSULE | Freq: Three times a day (TID) | ORAL | Status: DC
  Administered 2019-01-28 – 2019-01-29 (×2): 300 mg via ORAL
  Filled 2019-01-28 (×2): qty 1

## 2019-01-28 MED ORDER — POLYETHYLENE GLYCOL 3350 17 G PO PACK: 17.0000 g | PACK | Freq: Every day | ORAL | Status: DC | PRN

## 2019-01-28 MED ORDER — OXYCODONE HCL 5 MG PO TABS
5.0000 mg | ORAL_TABLET | ORAL | Status: DC | PRN
  Administered 2019-01-28 – 2019-01-29 (×4): 10 mg via ORAL
  Filled 2019-01-28 (×4): qty 2

## 2019-01-28 MED ORDER — MOMETASONE FURO-FORMOTEROL FUM 100-5 MCG/ACT IN AERO
2.0000 | INHALATION_SPRAY | Freq: Two times a day (BID) | RESPIRATORY_TRACT | Status: DC
  Administered 2019-01-28 – 2019-01-29 (×2): 2 via RESPIRATORY_TRACT
  Filled 2019-01-28: qty 8.8

## 2019-01-28 MED ORDER — ACETAMINOPHEN 500 MG PO TABS: 1000.0000 mg | ORAL_TABLET | Freq: Once | ORAL | Status: DC | PRN

## 2019-01-28 MED ORDER — UMECLIDINIUM BROMIDE 62.5 MCG/INH IN AEPB
1.0000 | INHALATION_SPRAY | Freq: Every day | RESPIRATORY_TRACT | Status: DC
  Administered 2019-01-29: 1 via RESPIRATORY_TRACT
  Filled 2019-01-28: qty 7

## 2019-01-28 MED ORDER — METHOCARBAMOL 500 MG IVPB - SIMPLE MED
500.0000 mg | Freq: Four times a day (QID) | INTRAVENOUS | Status: DC | PRN
  Administered 2019-01-28: 500 mg via INTRAVENOUS
  Filled 2019-01-28: qty 50

## 2019-01-28 SURGICAL SUPPLY — 58 items
ATTUNE MED DOME PAT 38 KNEE (Knees) ×2 IMPLANT
ATTUNE PS FEM RT SZ 6 CEM KNEE (Femur) ×2 IMPLANT
ATTUNE PSRP INSR SZ6 8 KNEE (Insert) ×2 IMPLANT
BAG ZIPLOCK 12X15 (MISCELLANEOUS) ×2 IMPLANT
BASE TIBIA ATTUNE KNEE SYS SZ6 (Knees) ×1 IMPLANT
BLADE SAG 18X100X1.27 (BLADE) ×2 IMPLANT
BLADE SAW SGTL 11.0X1.19X90.0M (BLADE) ×2 IMPLANT
BLADE SURG SZ10 CARB STEEL (BLADE) ×4 IMPLANT
BNDG ELASTIC 6X5.8 VLCR STR LF (GAUZE/BANDAGES/DRESSINGS) ×2 IMPLANT
BOWL SMART MIX CTS (DISPOSABLE) ×2 IMPLANT
CEMENT HV SMART SET (Cement) ×4 IMPLANT
COVER SURGICAL LIGHT HANDLE (MISCELLANEOUS) ×2 IMPLANT
COVER WAND RF STERILE (DRAPES) IMPLANT
CUFF TOURN SGL QUICK 34 (TOURNIQUET CUFF) ×1
CUFF TRNQT CYL 34X4.125X (TOURNIQUET CUFF) ×1 IMPLANT
DECANTER SPIKE VIAL GLASS SM (MISCELLANEOUS) ×2 IMPLANT
DRAPE U-SHAPE 47X51 STRL (DRAPES) ×2 IMPLANT
DRSG ADAPTIC 3X8 NADH LF (GAUZE/BANDAGES/DRESSINGS) ×2 IMPLANT
DRSG PAD ABDOMINAL 8X10 ST (GAUZE/BANDAGES/DRESSINGS) ×2 IMPLANT
DURAPREP 26ML APPLICATOR (WOUND CARE) ×2 IMPLANT
ELECT REM PT RETURN 15FT ADLT (MISCELLANEOUS) ×2 IMPLANT
EVACUATOR 1/8 PVC DRAIN (DRAIN) ×2 IMPLANT
GAUZE SPONGE 4X4 12PLY STRL (GAUZE/BANDAGES/DRESSINGS) ×2 IMPLANT
GLOVE BIO SURGEON STRL SZ7 (GLOVE) ×2 IMPLANT
GLOVE BIO SURGEON STRL SZ8 (GLOVE) ×2 IMPLANT
GLOVE BIOGEL PI IND STRL 6.5 (GLOVE) ×1 IMPLANT
GLOVE BIOGEL PI IND STRL 7.0 (GLOVE) ×1 IMPLANT
GLOVE BIOGEL PI IND STRL 8 (GLOVE) ×1 IMPLANT
GLOVE BIOGEL PI INDICATOR 6.5 (GLOVE) ×1
GLOVE BIOGEL PI INDICATOR 7.0 (GLOVE) ×1
GLOVE BIOGEL PI INDICATOR 8 (GLOVE) ×1
GLOVE SURG SS PI 6.5 STRL IVOR (GLOVE) ×2 IMPLANT
GOWN STRL REUS W/TWL LRG LVL3 (GOWN DISPOSABLE) ×6 IMPLANT
HANDPIECE INTERPULSE COAX TIP (DISPOSABLE) ×1
HOLDER FOLEY CATH W/STRAP (MISCELLANEOUS) IMPLANT
IMMOBILIZER KNEE 20 (SOFTGOODS) ×2
IMMOBILIZER KNEE 20 THIGH 36 (SOFTGOODS) ×1 IMPLANT
KIT TURNOVER KIT A (KITS) IMPLANT
MANIFOLD NEPTUNE II (INSTRUMENTS) ×2 IMPLANT
NS IRRIG 1000ML POUR BTL (IV SOLUTION) ×2 IMPLANT
PACK TOTAL KNEE CUSTOM (KITS) ×2 IMPLANT
PADDING CAST COTTON 6X4 STRL (CAST SUPPLIES) ×4 IMPLANT
PENCIL SMOKE EVACUATOR (MISCELLANEOUS) ×2 IMPLANT
PIN STEINMAN FIXATION KNEE (PIN) ×2 IMPLANT
PIN THREADED HEADED SIGMA (PIN) ×2 IMPLANT
PROTECTOR NERVE ULNAR (MISCELLANEOUS) ×2 IMPLANT
SET HNDPC FAN SPRY TIP SCT (DISPOSABLE) ×1 IMPLANT
STRIP CLOSURE SKIN 1/2X4 (GAUZE/BANDAGES/DRESSINGS) ×4 IMPLANT
SUT MNCRL AB 4-0 PS2 18 (SUTURE) ×2 IMPLANT
SUT STRATAFIX 0 PDS 27 VIOLET (SUTURE) ×2
SUT VIC AB 2-0 CT1 27 (SUTURE) ×3
SUT VIC AB 2-0 CT1 TAPERPNT 27 (SUTURE) ×3 IMPLANT
SUTURE STRATFX 0 PDS 27 VIOLET (SUTURE) ×1 IMPLANT
TIBIA ATTUNE KNEE SYS BASE SZ6 (Knees) ×2 IMPLANT
TRAY FOLEY MTR SLVR 16FR STAT (SET/KITS/TRAYS/PACK) ×2 IMPLANT
WATER STERILE IRR 1000ML POUR (IV SOLUTION) ×4 IMPLANT
WRAP KNEE MAXI GEL POST OP (GAUZE/BANDAGES/DRESSINGS) ×2 IMPLANT
YANKAUER SUCT BULB TIP 10FT TU (MISCELLANEOUS) ×2 IMPLANT

## 2019-01-28 NOTE — Evaluation (Signed)
Physical Therapy Evaluation Patient Details Name: Mitchell Parrish MRN: [id number] DOB: Nov 17, 1955 Today's Date: 01/28/2019   History of Present Illness  64 yo male s/p R TKA 01/28/19  Clinical Impression  On eval POD 0, pt required Min assist for mobility. He walked ~70 feet with a RW. Moderate pain with activity. Anticipate pt will progress well. Plan is for discharge home tomorrow if ready/meets PT goals.     Follow Up Recommendations Follow surgeon's recommendation for DC plan and follow-up therapies    Equipment Recommendations  None recommended by PT    Recommendations for Other Services       Precautions / Restrictions Precautions Precautions: Fall Required Braces or Orthoses: Knee Immobilizer - Right Knee Immobilizer - Right: Discontinue once straight leg raise with < 10 degree lag Restrictions Weight Bearing Restrictions: No Other Position/Activity Restrictions: WBAT      Mobility  Bed Mobility Overal bed mobility: Needs Assistance Bed Mobility: Supine to Sit     Supine to sit: Min assist;HOB elevated     General bed mobility comments: Assist for R LE  Transfers Overall transfer level: Needs assistance   Transfers: Sit to/from Stand Sit to Stand: From elevated surface;Min assist         General transfer comment: VCs safety, technique, sequence, hand/LE placement. Assist to rise, stabilize, control descent.  Ambulation/Gait Ambulation/Gait assistance: Min assist Gait Distance (Feet): 70 Feet Assistive device: Rolling walker (2 wheeled) Gait Pattern/deviations: Step-to pattern;Step-through pattern;Decreased stride length     General Gait Details: Intermittent assist to steady. VCs safety, sequence.  Stairs            Wheelchair Mobility    Modified Rankin (Stroke Patients Only)       Balance Overall balance assessment: Needs assistance         Standing balance support: Bilateral upper extremity supported Standing balance-Leahy  Scale: Poor                               Pertinent Vitals/Pain Pain Assessment: 0-10 Pain Score: 8  Pain Location: R knee Pain Descriptors / Indicators: Aching;Discomfort;Sore Pain Intervention(s): Monitored during session;Ice applied;Repositioned    Home Living Family/patient expects to be discharged to:: Private residence Living Arrangements: Spouse/significant other Available Help at Discharge: Family Type of Home: House Home Access: Stairs to enter;Ramped entrance   Entrance Stairs-Number of Steps: 1 Home Layout: One level [place]: Environmental consultant - 2 wheels      Prior Function Level of Independence: Independent               Hand Dominance        Extremity/Trunk Assessment   Upper Extremity Assessment Upper Extremity Assessment: Overall WFL for tasks assessed    Lower Extremity Assessment Lower Extremity Assessment: Generalized weakness    Cervical / Trunk Assessment Cervical / Trunk Assessment: Normal  Communication   Communication: No difficulties  Cognition Arousal/Alertness: Awake/alert Behavior During Therapy: WFL for tasks assessed/performed Overall Cognitive Status: Within Functional Limits for tasks assessed                                        General Comments      Exercises     Assessment/Plan    PT Assessment Patient needs continued PT services  PT Problem List Decreased strength;Decreased range of motion;Decreased mobility;Decreased activity tolerance;Decreased balance;Decreased  knowledge of use of DME;Pain;Decreased knowledge of precautions       PT Treatment Interventions DME instruction;Gait training;Therapeutic activities;Therapeutic exercise;Patient/family education;Balance training;Stair training;Functional mobility training    PT Goals (Current goals can be found in the Care Plan section)  Acute Rehab PT Goals Patient Stated Goal: less pain PT Goal Formulation: With patient Time For Goal  Achievement: 02/11/19 Potential to Achieve Goals: Good    Frequency 7X/week   Barriers to discharge        Co-evaluation               AM-PAC PT "6 Clicks" Mobility  Outcome Measure Help needed turning from your back to your side while in a flat bed without using bedrails?: A Little Help needed moving from lying on your back to sitting on the side of a flat bed without using bedrails?: A Little Help needed moving to and from a bed to a chair (including a wheelchair)?: A Little Help needed standing up from a chair using your arms (e.g., wheelchair or bedside chair)?: A Little Help needed to walk in hospital room?: A Little Help needed climbing 3-5 steps with a railing? : A Little 6 Click Score: 18    End of Session Equipment Utilized During Treatment: Gait belt;Right knee immobilizer Activity Tolerance: Patient tolerated treatment well Patient left: in bed;with call bell/phone within reach   PT Visit Diagnosis: Other abnormalities of gait and mobility (R26.89);Pain Pain - Right/Left: Right Pain - part of body: Knee    Time: [id number] PT Time Calculation (min) (ACUTE ONLY): 23 min   Charges:   PT Evaluation $PT Eval Low Complexity: 1 Low PT Treatments $Gait Training: 8-22 mins          Doreatha Massed, PT Acute Rehabilitation Services

## 2019-01-28 NOTE — Anesthesia Procedure Notes (Signed)
Spinal  Patient location during procedure: OR Start time: 01/28/2019 1:30 PM End time: 01/28/2019 1:40 PM Staffing Performed: anesthesiologist  Anesthesiologist: Oleta Mouse, MD Preanesthetic Checklist Completed: patient identified, IV checked, risks and benefits discussed, surgical consent, monitors and equipment checked, pre-op evaluation and timeout performed Spinal Block Patient position: sitting Prep: DuraPrep Patient monitoring: heart rate, cardiac monitor, continuous pulse ox and blood pressure Approach: midline Location: L3-4 Injection technique: single-shot Needle Needle type: Pencan  Needle gauge: 24 G Needle length: 9 cm Assessment Sensory level: T6

## 2019-01-28 NOTE — Anesthesia Procedure Notes (Signed)
Anesthesia Regional Block: Adductor canal block   Pre-Anesthetic Checklist: ,, timeout performed, Correct Patient, Correct Site, Correct Laterality, Correct Procedure, Correct Position, site marked, Risks and benefits discussed,  Surgical consent,  Pre-op evaluation,  At surgeon's request and post-op pain management  Laterality: Right and Lower  Prep: chloraprep       Needles:  Injection technique: Single-shot     Needle Length: 9cm  Needle Gauge: 22     Additional Needles: Arrow StimuQuik ECHO Echogenic Stimulating PNB Needle  Procedures:,,,, ultrasound used (permanent image in chart),,,,  Narrative:  Start time: 01/28/2019 1:13 PM End time: 01/28/2019 1:19 PM Injection made incrementally with aspirations every 5 mL.  Performed by: Personally  Anesthesiologist: Oleta Mouse, MD

## 2019-01-28 NOTE — Transfer of Care (Signed)
Immediate Anesthesia Transfer of Care Note  Patient: Mitchell Parrish  Procedure(s) Performed: TOTAL KNEE ARTHROPLASTY (Right Knee)  Patient Location: PACU  Anesthesia Type:Spinal and MAC combined with regional for post-op pain  Level of Consciousness: awake, alert , oriented and patient cooperative  Airway & Oxygen Therapy: Patient Spontanous Breathing and Patient connected to face mask oxygen  Post-op Assessment: Report given to RN and Post -op Vital signs reviewed and stable  Post vital signs: Reviewed and stable  Last Vitals:  Vitals Value Taken Time  BP 99/83   Temp 97.5   Pulse 56   Resp 16   SpO2 97     Last Pain:  Vitals:   01/28/19 1016  PainSc: 0-No pain      Patients Stated Pain Goal: 3 (88/41/66 0630)  Complications: No apparent anesthesia complications

## 2019-01-28 NOTE — Anesthesia Procedure Notes (Signed)
Procedure Name: MAC Date/Time: 01/28/2019 1:26 PM Performed by: West Pugh, CRNA Pre-anesthesia Checklist: Patient identified, Emergency Drugs available, Suction available, Patient being monitored and Timeout performed Patient Re-evaluated:Patient Re-evaluated prior to induction Oxygen Delivery Method: Simple face mask Preoxygenation: Pre-oxygenation with 100% oxygen Induction Type: IV induction Placement Confirmation: positive ETCO2 and breath sounds checked- equal and bilateral Dental Injury: Teeth and Oropharynx as per pre-operative assessment

## 2019-01-28 NOTE — Interval H&P Note (Signed)
History and Physical Interval Note:  01/28/2019 10:03 AM  Mitchell Parrish  has presented today for surgery, with the diagnosis of right knee osteoarthritis.  The various methods of treatment have been discussed with the patient and family. After consideration of risks, benefits and other options for treatment, the patient has consented to  Procedure(s): TOTAL KNEE ARTHROPLASTY (Right) as a surgical intervention.  The patient's history has been reviewed, patient examined, no change in status, stable for surgery.  I have reviewed the patient's chart and labs.  Questions were answered to the patient's satisfaction.     Pilar Plate [name]

## 2019-01-28 NOTE — Op Note (Signed)
OPERATIVE REPORT-TOTAL KNEE ARTHROPLASTY   Pre-operative diagnosis- Osteoarthritis  Right knee(s)  Post-operative diagnosis- Osteoarthritis Right knee(s)  Procedure-  Right  Total Knee Arthroplasty  Surgeon- Mitchell Plover. Koben Daman, MD  Assistant- Ardeen Jourdain, PA-C   Anesthesia-  Adductor canal block and spinal  EBL- 25 ml   Drains Hemovac  Tourniquet time-  Total Tourniquet Time Documented: Thigh (Right) - 38 minutes Total: Thigh (Right) - 38 minutes     Complications- None  Condition-PACU - hemodynamically stable.   Brief Clinical Note  Mitchell Parrish is a 63 y.o. year old male with end stage OA of her right knee with progressively worsening pain and dysfunction. She has constant pain, with activity and at rest and significant functional deficits with difficulties even with ADLs. She has had extensive non-op management including analgesics, injections of cortisone and viscosupplements, and home exercise program, but remains in significant pain with significant dysfunction.Radiographs show bone on bone arthritis medial and patellofemoral. She presents now for right Total Knee Arthroplasty.    Procedure in detail---   The patient is brought into the operating room and positioned supine on the operating table. After successful administration of  Adductor canal block and spinal,   a tourniquet is placed high on the  Right thigh(s) and the lower extremity is prepped and draped in the usual sterile fashion. Time out is performed by the operating team and then the  Right lower extremity is wrapped in Esmarch, knee flexed and the tourniquet inflated to 300 mmHg.       A midline incision is made with a ten blade through the subcutaneous tissue to the level of the extensor mechanism. A fresh blade is used to make a medial parapatellar arthrotomy. Soft tissue over the proximal medial tibia is subperiosteally elevated to the joint line with a knife and into the semimembranosus bursa with  a Cobb elevator. Soft tissue over the proximal lateral tibia is elevated with attention being paid to avoiding the patellar tendon on the tibial tubercle. The patella is everted, knee flexed 90 degrees and the ACL and PCL are removed. Findings are bone on bone medial and patellofemoral with large global osteophytes.        The drill is used to create a starting hole in the distal femur and the canal is thoroughly irrigated with sterile saline to remove the fatty contents. The 5 degree Right  valgus alignment guide is placed into the femoral canal and the distal femoral cutting block is pinned to remove 9 mm off the distal femur. Resection is made with an oscillating saw.      The tibia is subluxed forward and the menisci are removed. The extramedullary alignment guide is placed referencing proximally at the medial aspect of the tibial tubercle and distally along the second metatarsal axis and tibial crest. The block is pinned to remove 79mm off the more deficient medial  side. Resection is made with an oscillating saw. Size 6is the most appropriate size for the tibia and the proximal tibia is prepared with the modular drill and keel punch for that size.      The femoral sizing guide is placed and size 6 is most appropriate. Rotation is marked off the epicondylar axis and confirmed by creating a rectangular flexion gap at 90 degrees. The size 6 cutting block is pinned in this rotation and the anterior, posterior and chamfer cuts are made with the oscillating saw. The intercondylar block is then placed and that cut is made.  Trial size 6 tibial component, trial size 6 posterior stabilized femur and a 8  mm posterior stabilized rotating platform insert trial is placed. Full extension is achieved with excellent varus/valgus and anterior/posterior balance throughout full range of motion. The patella is everted and thickness measured to be 24  mm. Free hand resection is taken to 14 mm, a 38 template is placed, lug  holes are drilled, trial patella is placed, and it tracks normally. Osteophytes are removed off the posterior femur with the trial in place. All trials are removed and the cut bone surfaces prepared with pulsatile lavage. Cement is mixed and once ready for implantation, the size 6 tibial implant, size  6 posterior stabilized femoral component, and the size 38 patella are cemented in place and the patella is held with the clamp. The trial insert is placed and the knee held in full extension. The Exparel (20 ml mixed with 60 ml saline) is injected into the extensor mechanism, posterior capsule, medial and lateral gutters and subcutaneous tissues.  All extruded cement is removed and once the cement is hard the permanent 8 mm posterior stabilized rotating platform insert is placed into the tibial tray.      The wound is copiously irrigated with saline solution and the extensor mechanism closed over a hemovac drain with #1 V-loc suture. The tourniquet is released for a total tourniquet time of 38  minutes. Flexion against gravity is 140 degrees and the patella tracks normally. Subcutaneous tissue is closed with 2.0 vicryl and subcuticular with running 4.0 Monocryl. The incision is cleaned and dried and steri-strips and a bulky sterile dressing are applied. The limb is placed into a knee immobilizer and the patient is awakened and transported to recovery in stable condition.      Please note that a surgical assistant was a medical necessity for this procedure in order to perform it in a safe and expeditious manner. Surgical assistant was necessary to retract the ligaments and vital neurovascular structures to prevent injury to them and also necessary for proper positioning of the limb to allow for anatomic placement of the prosthesis.   Mitchell Plover Gurneet Matarese, MD    01/28/2019, 2:42 PM

## 2019-01-28 NOTE — Progress Notes (Signed)
AssistedDr. Moser with right, ultrasound guided, adductor canal block. Side rails up, monitors on throughout procedure. See vital signs in flow sheet. Tolerated Procedure well.  

## 2019-01-29 ENCOUNTER — Other Ambulatory Visit: Payer: BC Managed Care – PPO

## 2019-01-29 LAB — BASIC METABOLIC PANEL
Anion gap: 10 (ref 5–15)
BUN: 15 mg/dL (ref 8–23)
CO2: 21 mmol/L — ABNORMAL LOW (ref 22–32)
Calcium: 8.4 mg/dL — ABNORMAL LOW (ref 8.9–10.3)
Chloride: 102 mmol/L (ref 98–111)
Creatinine, Ser: 0.88 mg/dL (ref 0.61–1.24)
GFR calc Af Amer: >60 mL/min (ref >60)
GFR calc non Af Amer: >60 mL/min (ref >60)
Glucose, Bld: 171 mg/dL — ABNORMAL HIGH (ref 70–99)
Potassium: 4.8 mmol/L (ref 3.5–5.1)
Sodium: 133 mmol/L — ABNORMAL LOW (ref 135–145)

## 2019-01-29 LAB — CBC
HCT: 44.6 % (ref 39.0–52.0)
Hemoglobin: 14.3 g/dL (ref 13.0–17.0)
MCH: 31.9 pg (ref 26.0–34.0)
MCHC: 32.1 g/dL (ref 30.0–36.0)
MCV: 99.6 fL (ref 80.0–100.0)
Platelets: 206 10*3/uL (ref 150–400)
RBC: 4.48 MIL/uL (ref 4.22–5.81)
RDW: 13.2 % (ref 11.5–15.5)
WBC: 16.1 10*3/uL — ABNORMAL HIGH (ref 4.0–10.5)
nRBC: 0 % (ref 0.0–0.2)

## 2019-01-29 MED ORDER — OXYCODONE HCL 5 MG PO TABS: 5.0000 mg | ORAL_TABLET | Freq: Four times a day (QID) | ORAL | 0 refills | Status: DC | PRN

## 2019-01-29 MED ORDER — TRAMADOL HCL 50 MG PO TABS: 50.0000 mg | ORAL_TABLET | Freq: Four times a day (QID) | ORAL | 0 refills | Status: DC | PRN

## 2019-01-29 MED ORDER — METHOCARBAMOL 500 MG PO TABS: 500.0000 mg | ORAL_TABLET | Freq: Four times a day (QID) | ORAL | 0 refills | Status: DC | PRN

## 2019-01-29 MED ORDER — GABAPENTIN 300 MG PO CAPS: 300.0000 mg | ORAL_CAPSULE | Freq: Three times a day (TID) | ORAL | 0 refills | Status: DC

## 2019-01-29 NOTE — Progress Notes (Signed)
Discharge instructions given to patient. Patient had no questions. NT or writer will wheel patient out once family comes in  

## 2019-01-29 NOTE — Progress Notes (Signed)
   Subjective: 1 Day Post-Op Procedure(s) (LRB): TOTAL KNEE ARTHROPLASTY (Right) Patient reports pain as mild.   Patient seen in rounds by Dr. Wynelle Link. Patient is well, and has had no acute complaints or problems other than soreness in the right knee. Denies chest pain, SOB or calf pain. No issues overnight. Foley catheter to be removed this AM. States he is ready to go home. We will continue therapy today.   Objective: Vital signs in last 24 hours: Temp:  [97.3 F (36.3 C)-97.9 F (36.6 C)] 97.7 F (36.5 C) (12/22 0520) Pulse Rate:  [55-70] 64 (12/22 0520) Resp:  [10-18] 16 (12/22 0520) BP: (99-147)/(65-84) 139/81 (12/22 0520) SpO2:  [94 %-100 %] 95 % (12/22 0520) Weight:  [115.2 kg] 115.2 kg (12/21 1016)  Intake/Output from previous day:  Intake/Output Summary (Last 24 hours) at 01/29/2019 0711 Last data filed at 01/29/2019 [id number] Gross per 24 hour  Intake 3057.88 ml  Output 1765 ml  Net 1292.88 ml    Labs: Recent Labs    01/29/19 0507  HGB 14.3   Recent Labs    01/29/19 0507  WBC 16.1*  RBC 4.48  HCT 44.6  PLT 206   Recent Labs    01/29/19 0507  NA 133*  K 4.8  CL 102  CO2 21*  BUN 15  CREATININE 0.88  GLUCOSE 171*  CALCIUM 8.4*   Exam: General - Patient is Alert and Oriented Extremity - Neurologically intact Neurovascular intact Sensation intact distally Dorsiflexion/Plantar flexion intact Dressing - dressing C/D/I Motor Function - intact, moving foot and toes well on exam.   Past Medical History:  Diagnosis Date  . Acute diastolic CHF (congestive heart failure) ([place]) 05/29/2018  . Arthritis    left hip  . Atrial fibrillation ([place])   . Hyperlipidemia   . Hypertension   . Tubular adenoma of colon 11/2011    Assessment/Plan: 1 Day Post-Op Procedure(s) (LRB): TOTAL KNEE ARTHROPLASTY (Right) Principal Problem:   OA (osteoarthritis) of knee  Estimated body mass index is 36.45 kg/m as calculated from the following:   Height as of this  encounter: 5\' 10"  (1.778 m).   Weight as of this encounter: 115.2 kg. Advance diet Up with therapy D/C IV fluids  Anticipated LOS equal to or greater than 2 midnights due to - Age 62 and older with one or more of the following:  - Obesity  - Expected need for hospital services (PT, OT, Nursing) required for safe  discharge  - Anticipated need for postoperative skilled nursing care or inpatient rehab  - Active co-morbidities: Heart Failure and Cardiac Arrhythmia OR   - Unanticipated findings during/Post Surgery: None  - Patient is a high risk of re-admission due to: None    DVT Prophylaxis - Eliquis Weight bearing as tolerated. D/C O2 and pulse ox and try on room air. Hemovac pulled without difficulty, will continue therapy today.  Plan is to go Home after hospital stay. Plan for discharge after one session of therapy this AM as long as meeting goals. Scheduled for outpatient PT at [facility]. Follow-up in the office in 2 weeks.   Theresa Duty, PA-C Orthopedic Surgery 01/29/2019, 7:11 AM

## 2019-01-29 NOTE — Anesthesia Postprocedure Evaluation (Signed)
Anesthesia Post Note  Patient: SHELL YANDOW  Procedure(s) Performed: TOTAL KNEE ARTHROPLASTY (Right Knee)     Patient location during evaluation: PACU Anesthesia Type: Regional, MAC and Spinal Level of consciousness: awake and alert Pain management: pain level controlled Vital Signs Assessment: post-procedure vital signs reviewed and stable Respiratory status: spontaneous breathing, nonlabored ventilation, respiratory function stable and patient connected to nasal cannula oxygen Cardiovascular status: stable and blood pressure returned to baseline Postop Assessment: no apparent nausea or vomiting and spinal receding Anesthetic complications: no    Last Vitals:  Vitals:   01/29/19 0816 01/29/19 0925  BP:  (!) 148/100  Pulse:  73  Resp:  18  Temp:  (!) 36.3 C  SpO2: 94% 97%    Last Pain:  Vitals:   01/29/19 0925  TempSrc: Oral  PainSc:                  Randel Hargens

## 2019-01-29 NOTE — Discharge Instructions (Addendum)
Dr. Gaynelle Arabian Total Joint Specialist [facility] [address] [place], [place] [address] [phone]  TOTAL KNEE REPLACEMENT POSTOPERATIVE DIRECTIONS  Knee Rehabilitation, Guidelines Following Surgery  Results after knee surgery are often greatly improved when you follow the exercise, range of motion and muscle strengthening exercises prescribed by your doctor. Safety measures are also important to protect the knee from further injury. Any time any of these exercises cause you to have increased pain or swelling in your knee joint, decrease the amount until you are comfortable again and slowly increase them. If you have problems or questions, call your caregiver or physical therapist for advice.   HOME CARE INSTRUCTIONS  Remove items at home which could result in a fall. This includes throw rugs or furniture in walking pathways.   ICE to the affected knee every three hours for 30 minutes at a time and then as needed for pain and swelling.  Continue to use ice on the knee for pain and swelling from surgery. You may notice swelling that will progress down to the foot and ankle.  This is normal after surgery.  Elevate the leg when you are not up walking on it.    Continue to use the breathing machine which will help keep your temperature down.  It is common for your temperature to cycle up and down following surgery, especially at night when you are not up moving around and exerting yourself.  The breathing machine keeps your lungs expanded and your temperature down.  Do not place pillow under knee, focus on keeping the knee straight while resting  DIET You may resume your previous home diet once your are discharged from the hospital.  DRESSING / WOUND CARE / SHOWERING You may shower 3 days after surgery, but keep the wounds dry during showering.  You may use an occlusive plastic wrap (Press'n Seal for example), NO SOAKING/SUBMERGING IN THE BATHTUB.  If the bandage gets  wet, change with a clean dry gauze.  If the incision gets wet, pat the wound dry with a clean towel. You may start showering once you are discharged home but do not submerge the incision under water. Just pat the incision dry and apply a dry gauze dressing on daily. Change the surgical dressing daily and reapply a dry dressing each time.  ACTIVITY Walk with your walker as instructed. Use walker as long as suggested by your caregivers. Avoid periods of inactivity such as sitting longer than an hour when not asleep. This helps prevent blood clots.  You may resume a sexual relationship in one month or when given the OK by your doctor.  You may return to work once you are cleared by your doctor.  Do not drive a car for 6 weeks or until released by you surgeon.  Do not drive while taking narcotics.  WEIGHT BEARING Weight bearing as tolerated with assist device (walker, cane, etc) as directed, use it as long as suggested by your surgeon or therapist, typically at least 4-6 weeks.  POSTOPERATIVE CONSTIPATION PROTOCOL Constipation - defined medically as fewer than three stools per week and severe constipation as less than one stool per week.  One of the most common issues patients have following surgery is constipation.  Even if you have a regular bowel pattern at home, your normal regimen is likely to be disrupted due to multiple reasons following surgery.  Combination of anesthesia, postoperative narcotics, change in appetite and fluid intake all can affect your bowels.  In order to avoid complications following surgery, here are some recommendations in order to help you during your recovery period.  Colace (docusate) - Pick up an over-the-counter form of Colace or another stool softener and take twice a day as long as you are requiring postoperative pain medications.  Take with a full glass of water daily.  If you experience loose stools or diarrhea, hold the colace until you stool forms back up.  If  your symptoms do not get better within 1 week or if they get worse, check with your doctor.  Dulcolax (bisacodyl) - Pick up over-the-counter and take as directed by the product packaging as needed to assist with the movement of your bowels.  Take with a full glass of water.  Use this product as needed if not relieved by Colace only.   MiraLax (polyethylene glycol) - Pick up over-the-counter to have on hand.  MiraLax is a solution that will increase the amount of water in your bowels to assist with bowel movements.  Take as directed and can mix with a glass of water, juice, soda, coffee, or tea.  Take if you go more than two days without a movement. Do not use MiraLax more than once per day. Call your doctor if you are still constipated or irregular after using this medication for 7 days in a row.  If you continue to have problems with postoperative constipation, please contact the office for further assistance and recommendations.  If you experience "the worst abdominal pain ever" or develop nausea or vomiting, please contact the office immediatly for further recommendations for treatment.  ITCHING  If you experience itching with your medications, try taking only a single pain pill, or even half a pain pill at a time.  You can also use Benadryl over the counter for itching or also to help with sleep.   TED HOSE STOCKINGS Wear the elastic stockings on both legs for three weeks following surgery during the day but you may remove then at night for sleeping.  MEDICATIONS See your medication summary on the "After Visit Summary" that the nursing staff will review with you prior to discharge.  You may have some home medications which will be placed on hold until you complete the course of blood thinner medication.  It is important for you to complete the blood thinner medication as prescribed by your surgeon.  Continue your approved medications as instructed at time of discharge.  PRECAUTIONS If you  experience chest pain or shortness of breath - call 911 immediately for transfer to the hospital emergency department.  If you develop a fever greater that 101 F, purulent drainage from wound, increased redness or drainage from wound, foul odor from the wound/dressing, or calf pain - CONTACT YOUR SURGEON.                                                   FOLLOW-UP APPOINTMENTS Make sure you keep all of your appointments after your operation with your surgeon and caregivers. You should call the office at the above phone number and make an appointment for approximately two weeks after the date of your surgery or on the date instructed by your surgeon outlined in the "After Visit Summary".   RANGE OF MOTION AND STRENGTHENING EXERCISES  Rehabilitation of the knee is important following a knee injury or  an operation. After just a few days of immobilization, the muscles of the thigh which control the knee become weakened and shrink (atrophy). Knee exercises are designed to build up the tone and strength of the thigh muscles and to improve knee motion. Often times heat used for twenty to thirty minutes before working out will loosen up your tissues and help with improving the range of motion but do not use heat for the first two weeks following surgery. These exercises can be done on a training (exercise) mat, on the floor, on a table or on a bed. Use what ever works the best and is most comfortable for you Knee exercises include:  Leg Lifts - While your knee is still immobilized in a splint or cast, you can do straight leg raises. Lift the leg to 60 degrees, hold for 3 sec, and slowly lower the leg. Repeat 10-20 times 2-3 times daily. Perform this exercise against resistance later as your knee gets better.  Quad and Hamstring Sets - Tighten up the muscle on the front of the thigh (Quad) and hold for 5-10 sec. Repeat this 10-20 times hourly. Hamstring sets are done by pushing the foot backward against an object  and holding for 5-10 sec. Repeat as with quad sets.   Leg Slides: Lying on your back, slowly slide your foot toward your buttocks, bending your knee up off the floor (only go as far as is comfortable). Then slowly slide your foot back down until your leg is flat on the floor again.  Angel Wings: Lying on your back spread your legs to the side as far apart as you can without causing discomfort.  A rehabilitation program following serious knee injuries can speed recovery and prevent re-injury in the future due to weakened muscles. Contact your doctor or a physical therapist for more information on knee rehabilitation.   IF YOU ARE TRANSFERRED TO A SKILLED REHAB FACILITY If the patient is transferred to a skilled rehab facility following release from the hospital, a list of the current medications will be sent to the facility for the patient to continue.  When discharged from the skilled rehab facility, please have the facility set up the patient's [place] prior to being released. Also, the skilled facility will be responsible for providing the patient with their medications at time of release from the facility to include their pain medication, the muscle relaxants, and their blood thinner medication. If the patient is still at the rehab facility at time of the two week follow up appointment, the skilled rehab facility will also need to assist the patient in arranging follow up appointment in our office and any transportation needs.  MAKE SURE YOU:  Understand these instructions.  Get help right away if you are not doing well or get worse.    Pick up stool softner and laxative for home use following surgery while on pain medications. Do not submerge incision under water. Please use good hand washing techniques while changing dressing each day. May shower starting three days after surgery. Please use a clean towel to pat the incision dry following showers. Continue to use ice for  pain and swelling after surgery. Do not use any lotions or creams on the incision until instructed by your surgeon.  Information on my medicine - ELIQUIS (apixaban)  This medication education was reviewed with me or my healthcare representative as part of my discharge preparation.  The pharmacist that spoke with me during my hospital stay  was:  Minda Ditto, RPH  Why was Eliquis prescribed for you? Eliquis was prescribed for you to reduce the risk of blood clots forming after orthopedic surgery.    What do You need to know about Eliquis? Take your Eliquis TWICE DAILY - one tablet in the morning and one tablet in the evening with or without food.  It would be best to take the dose about the same time each day.  If you have difficulty swallowing the tablet whole please discuss with your pharmacist how to take the medication safely.  Take Eliquis exactly as prescribed by your doctor and DO NOT stop taking Eliquis without talking to the doctor who prescribed the medication.  Stopping without other medication to take the place of Eliquis may increase your risk of developing a clot.  After discharge, you should have regular check-up appointments with your healthcare provider that is prescribing your Eliquis.  What do you do if you miss a dose? If a dose of ELIQUIS is not taken at the scheduled time, take it as soon as possible on the same day and twice-daily administration should be resumed.  The dose should not be doubled to make up for a missed dose.  Do not take more than one tablet of ELIQUIS at the same time.  Important Safety Information A possible side effect of Eliquis is bleeding. You should call your healthcare provider right away if you experience any of the following: ? Bleeding from an injury or your nose that does not stop. ? Unusual colored urine (red or dark brown) or unusual colored stools (red or black). ? Unusual bruising for unknown reasons. ? A serious fall or  if you hit your head (even if there is no bleeding).  Some medicines may interact with Eliquis and might increase your risk of bleeding or clotting while on Eliquis. To help avoid this, consult your healthcare provider or pharmacist prior to using any new prescription or non-prescription medications, including herbals, vitamins, non-steroidal anti-inflammatory drugs (NSAIDs) and supplements.  This website has more information on Eliquis (apixaban): http://www.eliquis.com/eliquis/home  Patient on Eliquis 5mg  bid prior to admission, per Ortho MD, Eliquis 2.5mg  this am (12/22), then resume home dose.

## 2019-01-29 NOTE — Progress Notes (Signed)
Physical Therapy Treatment Patient Details Name: Mitchell Parrish MRN: [id number] DOB: 03/19/1955 Today's Date: 01/29/2019    History of Present Illness 63 yo male s/p R TKA 01/28/19    PT Comments    Progressing well with mobility. Reviewed/practiced exercises, gait training, and stair training. Issued HEP for pt to perform 2x/day until he begins OPPT. Encouraged pt to walk often at home. All education completed. Okay to discharge from PT standpoint.    Follow Up Recommendations  Follow surgeon's recommendation for DC plan and follow-up therapies     Equipment Recommendations  None recommended by PT    Recommendations for Other Services       Precautions / Restrictions Precautions Precautions: Fall Knee Immobilizer - Right: (able to SLR I 12/22) Restrictions Weight Bearing Restrictions: No Other Position/Activity Restrictions: WBAT    Mobility  Bed Mobility Overal bed mobility: Needs Assistance Bed Mobility: Supine to Sit     Supine to sit: Supervision;HOB elevated        Transfers Overall transfer level: Needs assistance Equipment used: Rolling walker (2 wheeled) Transfers: Sit to/from Stand Sit to Stand: Supervision         General transfer comment: VCs safety, technique, sequence, hand/LE placement.  Ambulation/Gait Ambulation/Gait assistance: Min guard Gait Distance (Feet): 85 Feet Assistive device: Rolling walker (2 wheeled) Gait Pattern/deviations: Step-to pattern;Step-through pattern;Decreased stride length     General Gait Details: close guard for safety. no lob.   Stairs Stairs: Yes Stairs assistance: Min guard Stair Management: Step to pattern;Forwards;With walker Number of Stairs: 1 General stair comments: VCs safety, technique, sequence.   Wheelchair Mobility    Modified Rankin (Stroke Patients Only)       Balance Overall balance assessment: Needs assistance         Standing balance support: Bilateral upper extremity  supported Standing balance-Leahy Scale: Fair                              Cognition Arousal/Alertness: Awake/alert Behavior During Therapy: WFL for tasks assessed/performed Overall Cognitive Status: Within Functional Limits for tasks assessed                                        Exercises Total Joint Exercises Ankle Circles/Pumps: AROM;Both;10 reps;Supine Quad Sets: AROM;Both;10 reps;Supine Short Arc Quad: AROM;Right;10 reps;Supine Heel Slides: AAROM;Right;10 reps;Supine Hip ABduction/ADduction: AROM;Right;10 reps;Supine Straight Leg Raises: AROM;Right;10 reps;Supine Long Arc Quad: AROM;Right;10 reps;Supine Knee Flexion: AROM;Right;10 reps;Seated Goniometric ROM: ~10-80 degrees    General Comments        Pertinent Vitals/Pain Pain Assessment: 0-10 Pain Score: 6  Pain Location: R thigh Pain Descriptors / Indicators: Aching;Discomfort;Sore Pain Intervention(s): Monitored during session;Ice applied;Repositioned    Home Living                      Prior Function            PT Goals (current goals can now be found in the care plan section) Progress towards PT goals: Progressing toward goals    Frequency    7X/week      PT Plan Current plan remains appropriate    Co-evaluation              AM-PAC PT "6 Clicks" Mobility   Outcome Measure  Help needed turning from your back to your side while in a  flat bed without using bedrails?: None Help needed moving from lying on your back to sitting on the side of a flat bed without using bedrails?: None Help needed moving to and from a bed to a chair (including a wheelchair)?: A Little Help needed standing up from a chair using your arms (e.g., wheelchair or bedside chair)?: A Little Help needed to walk in hospital room?: A Little Help needed climbing 3-5 steps with a railing? : A Little 6 Click Score: 20    End of Session Equipment Utilized During Treatment: Gait  belt Activity Tolerance: Patient tolerated treatment well Patient left: in bed;with call bell/phone within reach   PT Visit Diagnosis: Other abnormalities of gait and mobility (R26.89);Pain Pain - Right/Left: Right Pain - part of body: Knee     Time: [id number] PT Time Calculation (min) (ACUTE ONLY): 28 min  Charges:  $Gait Training: 8-22 mins $Therapeutic Exercise: 8-22 mins                         Doreatha Massed, PT Acute Rehabilitation Services

## 2019-01-30 ENCOUNTER — Encounter: Payer: Self-pay | Admitting: *Deleted

## 2019-01-30 NOTE — Discharge Summary (Signed)
Physician Discharge Summary   Patient ID: CHRISTOPHE GOBEL MRN: [id number] DOB/AGE: May 28, 1955 63 y.o.  Admit date: 01/28/2019 Discharge date: 01/29/2019  Primary Diagnosis: Osteoarthritis, right knee   Admission Diagnoses:  Past Medical History:  Diagnosis Date  . Acute diastolic CHF (congestive heart failure) ([place]) 05/29/2018  . Arthritis    left hip  . Atrial fibrillation ([place])   . Hyperlipidemia   . Hypertension   . Tubular adenoma of colon 11/2011   Discharge Diagnoses:   Principal Problem:   OA (osteoarthritis) of knee  Estimated body mass index is 36.45 kg/m as calculated from the following:   Height as of this encounter: 5\' 10"  (1.778 m).   Weight as of this encounter: 115.2 kg.  Procedure:  Procedure(s) (LRB): TOTAL KNEE ARTHROPLASTY (Right)   Consults: None  HPI: Mitchell Parrish is a 63 y.o. year old male with end stage OA of her right knee with progressively worsening pain and dysfunction. She has constant pain, with activity and at rest and significant functional deficits with difficulties even with ADLs. She has had extensive non-op management including analgesics, injections of cortisone and viscosupplements, and home exercise program, but remains in significant pain with significant dysfunction.Radiographs show bone on bone arthritis medial and patellofemoral. She presents now for right Total Knee Arthroplasty.   Laboratory Data: Admission on 01/28/2019, Discharged on 01/29/2019  Component Date Value Ref Range Status  . MRSA, PCR 01/25/2019 NEGATIVE  NEGATIVE Final  . Staphylococcus aureus 01/25/2019 NEGATIVE  NEGATIVE Final   Comment: (NOTE) The Xpert SA Assay (FDA approved for NASAL specimens in patients 61 years of age and older), is one component of a comprehensive surveillance program. It is not intended to diagnose infection nor to guide or monitor treatment. Performed at [facility], [place] [address] [place], [place]  [address]   . WBC 01/25/2019 6.9  4.0 - 10.5 K/uL Final  . RBC 01/25/2019 4.92  4.22 - 5.81 MIL/uL Final  . Hemoglobin 01/25/2019 16.1  13.0 - 17.0 g/dL Final  . HCT 01/25/2019 48.7  39.0 - 52.0 % Final  . MCV 01/25/2019 99.0  80.0 - 100.0 fL Final  . MCH 01/25/2019 32.7  26.0 - 34.0 pg Final  . MCHC 01/25/2019 33.1  30.0 - 36.0 g/dL Final  . RDW 01/25/2019 13.2  11.5 - 15.5 % Final  . Platelets 01/25/2019 201  150 - 400 K/uL Final  . nRBC 01/25/2019 0.0  0.0 - 0.2 % Final   Performed at [facility], [place] [address] [place], [place] [address]  . Sodium 01/25/2019 138  135 - 145 mmol/L Final  . Potassium 01/25/2019 4.4  3.5 - 5.1 mmol/L Final  . Chloride 01/25/2019 105  98 - 111 mmol/L Final  . CO2 01/25/2019 24  22 - 32 mmol/L Final  . Glucose, Bld 01/25/2019 106* 70 - 99 mg/dL Final  . BUN 01/25/2019 14  8 - 23 mg/dL Final  . Creatinine, Ser 01/25/2019 0.94  0.61 - 1.24 mg/dL Final  . Calcium 01/25/2019 8.5* 8.9 - 10.3 mg/dL Final  . Total Protein 01/25/2019 6.6  6.5 - 8.1 g/dL Final  . Albumin 01/25/2019 3.5  3.5 - 5.0 g/dL Final  . AST 01/25/2019 16  15 - 41 U/L Final  . ALT 01/25/2019 22  0 - 44 U/L Final  . Alkaline Phosphatase 01/25/2019 60  38 - 126 U/L Final  . Total Bilirubin 01/25/2019 1.1  0.3 - 1.2 mg/dL Final  .  GFR calc non Af Amer 01/25/2019 >60  >60 mL/min Final  . GFR calc Af Amer 01/25/2019 >60  >60 mL/min Final  . Anion gap 01/25/2019 9  5 - 15 Final   Performed at [facility], [place] [address], [place], [place] [address]  . Prothrombin Time 01/25/2019 12.4  11.4 - 15.2 seconds Final  . INR 01/25/2019 0.9  0.8 - 1.2 Final   Comment: (NOTE) INR goal varies based on device and disease states. Performed at [facility] [place] [address], [place], [place] [address]   . aPTT 01/25/2019 32  24 - 36 seconds Final   Performed at [facility], [place] [address] [place], [place] [address]  . ABO/RH(D)  01/25/2019 O POS   Final  . Antibody Screen 01/25/2019 NEG   Final  . Sample Expiration 01/25/2019 01/31/2019,2359   Final  . Extend sample reason 01/25/2019    Final                   Value:NO TRANSFUSIONS OR PREGNANCY IN THE PAST 3 MONTHS Performed at [place] [address], [place], [place] [address]   . WBC 01/29/2019 16.1* 4.0 - 10.5 K/uL Final  . RBC 01/29/2019 4.48  4.22 - 5.81 MIL/uL Final  . Hemoglobin 01/29/2019 14.3  13.0 - 17.0 g/dL Final  . HCT 01/29/2019 44.6  39.0 - 52.0 % Final  . MCV 01/29/2019 99.6  80.0 - 100.0 fL Final  . MCH 01/29/2019 31.9  26.0 - 34.0 pg Final  . MCHC 01/29/2019 32.1  30.0 - 36.0 g/dL Final  . RDW 01/29/2019 13.2  11.5 - 15.5 % Final  . Platelets 01/29/2019 206  150 - 400 K/uL Final  . nRBC 01/29/2019 0.0  0.0 - 0.2 % Final   Performed at [facility], [place] [address], [place], [place] [address]  . Sodium 01/29/2019 133* 135 - 145 mmol/L Final  . Potassium 01/29/2019 4.8  3.5 - 5.1 mmol/L Final  . Chloride 01/29/2019 102  98 - 111 mmol/L Final  . CO2 01/29/2019 21* 22 - 32 mmol/L Final  . Glucose, Bld 01/29/2019 171* 70 - 99 mg/dL Final  . BUN 01/29/2019 15  8 - 23 mg/dL Final  . Creatinine, Ser 01/29/2019 0.88  0.61 - 1.24 mg/dL Final  . Calcium 01/29/2019 8.4* 8.9 - 10.3 mg/dL Final  . GFR calc non Af Amer 01/29/2019 >60  >60 mL/min Final  . GFR calc Af Amer 01/29/2019 >60  >60 mL/min Final  . Anion gap 01/29/2019 10  5 - 15 Final   Performed at [facility], [place] [address] [place], [place] [address]  Hospital Outpatient Visit on 01/25/2019  Component Date Value Ref Range Status  . ABO/RH(D) 01/25/2019    Final                   Value:O POS Performed at [facility], [place] [address], [place], [place] [address]   Hospital Outpatient Visit on 01/24/2019  Component Date Value Ref Range Status  . SARS-CoV-2, NAA 01/24/2019 NOT DETECTED  NOT DETECTED Final    Comment: (NOTE) This nucleic acid amplification test was developed and its performance characteristics determined by [employer]. Nucleic acid amplification tests include PCR and TMA. This test has not been FDA cleared or approved. This test has been authorized by FDA under an Emergency Use Authorization (EUA). This test is only authorized for the  duration of time the declaration that circumstances exist justifying the authorization of the emergency use of in vitro diagnostic tests for detection of SARS-CoV-2 virus and/or diagnosis of COVID-19 infection under section 564(b)(1) of the Act, 21 U.S.C. [id number]) (1), unless the authorization is terminated or revoked sooner. When diagnostic testing is negative, the possibility of a false negative result should be considered in the context of a patient's recent exposures and the presence of clinical signs and symptoms consistent with COVID-19. An individual without symptoms of COVID- 19 and who is not shedding SARS-CoV-2 vi                          rus would expect to have a negative (not detected) result in this assay. Performed At: [facility] [place], [place] [id number] Rush Farmer MD [id number]   . Coronavirus Source 01/24/2019 NASOPHARYNGEAL   Final   Performed at [facility], [place] [address] [place], [place] [address]  Hospital Outpatient Visit on 01/24/2019  Component Date Value Ref Range Status  . MRSA, PCR 01/24/2019 NEGATIVE  NEGATIVE Final  . Staphylococcus aureus 01/24/2019 NEGATIVE  NEGATIVE Final   Comment: (NOTE) The Xpert SA Assay (FDA approved for NASAL specimens in patients 46 years of age and older), is one component of a comprehensive surveillance program. It is not intended to diagnose infection nor to guide or monitor treatment. Performed at [facility], [place] [address], [place], [place] [address]   Lab on 01/01/2019  Component Date Value Ref  Range Status  . WBC 01/01/2019 9.2  4.0 - 10.5 K/uL Final  . RBC 01/01/2019 5.01  4.22 - 5.81 Mil/uL Final  . Platelets 01/01/2019 221.0  150.0 - 400.0 K/uL Final  . Hemoglobin 01/01/2019 16.7  13.0 - 17.0 g/dL Final  . HCT 01/01/2019 48.8  39.0 - 52.0 % Final  . MCV 01/01/2019 97.3  78.0 - 100.0 fl Final  . MCHC 01/01/2019 34.2  30.0 - 36.0 g/dL Final  . RDW 01/01/2019 13.7  11.5 - 15.5 % Final  Lab on 12/18/2018  Component Date Value Ref Range Status  . PSA 12/18/2018 0.54  0.10 - 4.00 ng/mL Final   Test performed using [employer] PSA Assay, a parmagnetic partical, chemiluminecent immunoassay.  . Cholesterol 12/18/2018 240* 0 - 200 mg/dL Final   ATP III Classification       Desirable:  < 200 mg/dL               Borderline High:  200 - 239 mg/dL          High:  > = 240 mg/dL  . Triglycerides 12/18/2018 109.0  0.0 - 149.0 mg/dL Final   Normal:  <150 mg/dLBorderline High:  150 - 199 mg/dL  . HDL 12/18/2018 48.10  >39.00 mg/dL Final  . VLDL 12/18/2018 21.8  0.0 - 40.0 mg/dL Final  . LDL Cholesterol 12/18/2018 170* 0 - 99 mg/dL Final  . Total CHOL/HDL Ratio 12/18/2018 5   Final                  Men          Women1/2 Average Risk     3.4          3.3Average Risk          5.0          4.42X Average Risk  9.6          7.13X Average Risk          15.0          11.0                      . NonHDL 12/18/2018 191.65   Final   NOTE:  Non-HDL goal should be 30 mg/dL higher than patient's LDL goal (i.e. LDL goal of < 70 mg/dL, would have non-HDL goal of < 100 mg/dL)  . Sodium 12/18/2018 136  135 - 145 mEq/L Final  . Potassium 12/18/2018 4.6  3.5 - 5.1 mEq/L Final  . Chloride 12/18/2018 101  96 - 112 mEq/L Final  . CO2 12/18/2018 27  19 - 32 mEq/L Final  . Glucose, Bld 12/18/2018 112* 70 - 99 mg/dL Final  . BUN 12/18/2018 19  6 - 23 mg/dL Final  . Creatinine, Ser 12/18/2018 1.07  0.40 - 1.50 mg/dL Final  . Total Bilirubin 12/18/2018 0.8  0.2 - 1.2 mg/dL Final  . Alkaline Phosphatase  12/18/2018 89  39 - 117 U/L Final  . AST 12/18/2018 15  0 - 37 U/L Final  . ALT 12/18/2018 22  0 - 53 U/L Final  . Total Protein 12/18/2018 7.0  6.0 - 8.3 g/dL Final  . Albumin 12/18/2018 4.5  3.5 - 5.2 g/dL Final  . GFR 12/18/2018 69.92  >60.00 mL/min Final  . Calcium 12/18/2018 9.2  8.4 - 10.5 mg/dL Final  . WBC 12/18/2018 9.0  4.0 - 10.5 K/uL Final  . RBC 12/18/2018 5.10  4.22 - 5.81 Mil/uL Final  . Platelets 12/18/2018 211.0  150.0 - 400.0 K/uL Final  . Hemoglobin 12/18/2018 17.2* 13.0 - 17.0 g/dL Final  . HCT 12/18/2018 49.7  39.0 - 52.0 % Final  . MCV 12/18/2018 97.5  78.0 - 100.0 fl Final  . MCHC 12/18/2018 34.6  30.0 - 36.0 g/dL Final  . RDW 12/18/2018 13.9  11.5 - 15.5 % Final     X-Rays:ECHOCARDIOGRAM COMPLETE  Result Date: 01/01/2019   ECHOCARDIOGRAM REPORT   Patient Name:   ATLANTIS RAUSEO Date of Exam: 01/01/2019 Medical Rec #:  [id number]         Height:       70.0 in Accession #:    [id number]        Weight:       254.0 lb Date of Birth:  [birth year]         BSA:          2.31 m Patient Age:    61 years          BP:           116/74 mmHg Patient Gender: M                 HR:           67 bpm. Exam Location:  [facility] Procedure: 2D Echo, Cardiac Doppler and Color Doppler Indications:    I42.8  History:        Patient has prior history of Echocardiogram examinations, most                 recent 06/11/2018. Non ischemic cardiomyopathy, Arrythmias:Atrial                 Fibrillation; Risk Factors:Hypertension, Dyslipidemia, Former                 Smoker and Obesity.  Sonographer:    Coralyn Helling RDCS Referring Phys: [place]  1. Left ventricular ejection fraction, by visual estimation, is 60 to 65%. The left ventricle has normal function. There is no left ventricular hypertrophy.  2. The left ventricle has no regional wall motion abnormalities.  3. Left ventricular diastolic parameters are consistent with Grade II diastolic dysfunction  (pseudonormalization).  4. Global right ventricle has normal systolic function.The right ventricular size is normal. No increase in right ventricular wall thickness.  5. Left atrial size was normal.  6. Right atrial size was normal.  7. The mitral valve is normal in structure. Trace mitral valve regurgitation. No evidence of mitral stenosis.  8. The tricuspid valve is normal in structure. Tricuspid valve regurgitation is trivial.  9. The aortic valve is tricuspid. Aortic valve regurgitation is not visualized. No evidence of aortic valve sclerosis or stenosis. 10. There is mild dilatation of the aortic root measuring 38 mm. 11. The inferior vena cava is normal in size with greater than 50% respiratory variability, suggesting right atrial pressure of 3 mmHg. 12. The tricuspid regurgitant velocity is 2.51 m/s, and with an assumed right atrial pressure of 3 mmHg, the estimated right ventricular systolic pressure is normal at 28.2 mmHg. FINDINGS  Left Ventricle: Left ventricular ejection fraction, by visual estimation, is 60 to 65%. The left ventricle has normal function. The left ventricle has no regional wall motion abnormalities. The left ventricular internal cavity size was the left ventricle is normal in size. There is no left ventricular hypertrophy. Left ventricular diastolic parameters are consistent with Grade II diastolic dysfunction (pseudonormalization). Right Ventricle: The right ventricular size is normal. No increase in right ventricular wall thickness. Global RV systolic function is has normal systolic function. The tricuspid regurgitant velocity is 2.51 m/s, and with an assumed right atrial pressure  of 3 mmHg, the estimated right ventricular systolic pressure is normal at 28.2 mmHg. Left Atrium: Left atrial size was normal in size. Right Atrium: Right atrial size was normal in size Pericardium: There is no evidence of pericardial effusion. Mitral Valve: The mitral valve is normal in structure. No  evidence of mitral valve stenosis by observation. Trace mitral valve regurgitation. Tricuspid Valve: The tricuspid valve is normal in structure. Tricuspid valve regurgitation is trivial. Aortic Valve: The aortic valve is tricuspid. Aortic valve regurgitation is not visualized. The aortic valve is structurally normal, with no evidence of sclerosis or stenosis. Pulmonic Valve: The pulmonic valve was normal in structure. Pulmonic valve regurgitation is not visualized. Aorta: Aortic dilatation noted. There is mild dilatation of the aortic root measuring 38 mm. Venous: The inferior vena cava is normal in size with greater than 50% respiratory variability, suggesting right atrial pressure of 3 mmHg. IAS/Shunts: No atrial level shunt detected by color flow Doppler.  LEFT VENTRICLE PLAX 2D LVIDd:         5.50 cm  Diastology LVIDs:         3.60 cm  LV e' lateral:   8.05 cm/s LV PW:         1.00 cm  LV E/e' lateral: 11.6 LV IVS:        1.10 cm  LV e' medial:    8.05 cm/s LVOT diam:     2.10 cm  LV E/e' medial:  11.6 LV SV:         93 ml LV SV Index:   38.27 LVOT Area:     3.46 cm  RIGHT VENTRICLE  IVC RV S prime:     15.80 cm/s  IVC diam: 1.40 cm TAPSE (M-mode): 2.2 cm RVSP:           28.2 mmHg LEFT ATRIUM             Index       RIGHT ATRIUM           Index LA diam:        4.10 cm 1.77 cm/m  RA Pressure: 3.00 mmHg LA Vol (A2C):   40.4 ml 17.49 ml/m RA Area:     15.60 cm LA Vol (A4C):   42.5 ml 18.39 ml/m RA Volume:   39.00 ml  16.88 ml/m LA Biplane Vol: 42.1 ml 18.22 ml/m  AORTIC VALVE LVOT Vmax:   131.00 cm/s LVOT Vmean:  81.500 cm/s LVOT VTI:    0.273 m  AORTA Ao Root diam: 3.80 cm Ao Asc diam:  3.50 cm MITRAL VALVE                        TRICUSPID VALVE MV Area (PHT):                      TR Peak grad:   25.2 mmHg                                     TR Vmax:        251.00 cm/s MV Decel Time: 230 msec             Estimated RAP:  3.00 mmHg MV E velocity: 93.00 cm/s 103 cm/s  RVSP:           28.2 mmHg MV A  velocity: 84.00 cm/s 70.3 cm/s MV E/A ratio:  1.11       1.5       SHUNTS                                     Systemic VTI:  0.27 m                                     Systemic Diam: 2.10 cm  Loralie Champagne MD Electronically signed by Loralie Champagne MD Signature Date/Time: 01/01/2019/3:27:14 PM    Final     EKG: Orders placed or performed in visit on 01/01/19  . EKG 12-Lead     Hospital Course: DILYNN ROMMEL is a 63 y.o. who was admitted to [facility]. They were brought to the operating room on 01/28/2019 and underwent Procedure(s): TOTAL KNEE ARTHROPLASTY.  Patient tolerated the procedure well and was later transferred to the recovery room and then to the orthopaedic floor for postoperative care. They were given PO and IV analgesics for pain control following their surgery. They were given 24 hours of postoperative antibiotics of  Anti-infectives (From admission, onward)   Start     Dose/Rate Route Frequency Ordered Stop   01/28/19 2000  ceFAZolin (ANCEF) IVPB 2g/100 mL premix     2 g 200 mL/hr over 30 Minutes Intravenous Every 6 hours 01/28/19 1839 01/29/19 0627   01/28/19 1015  ceFAZolin (ANCEF) IVPB 2g/100 mL premix     2 g 200 mL/hr over 30 Minutes  Intravenous On call to O.R. 01/28/19 1002 01/28/19 1411     and started on DVT prophylaxis in the form of Eliquis.   PT and OT were ordered for total joint protocol. Discharge planning consulted to help with postop disposition and equipment needs.  Patient had a good night on the evening of surgery. They started to get up OOB with therapy on POD #0. Pt was seen during rounds and was ready to go home pending progress with therapy. Hemovac drain was pulled without difficulty. He worked with therapy on POD #1 and was meeting his goals. Pt was discharged to home later that day in stable condition.  Diet: Cardiac diet Activity: WBAT Follow-up: in 2 weeks Disposition: Home with outpatient PT at [facility] Discharged Condition:  stable   Discharge Instructions    Call MD / Call 911   Complete by: As directed    If you experience chest pain or shortness of breath, CALL 911 and be transported to the hospital emergency room.  If you develope a fever above 101 F, pus (white drainage) or increased drainage or redness at the wound, or calf pain, call your surgeon's office.   Change dressing   Complete by: As directed    Change dressing on Wednesday, then change the dressing daily with sterile 4 x 4 inch gauze dressing and apply TED hose.   Constipation Prevention   Complete by: As directed    Drink plenty of fluids.  Prune juice may be helpful.  You may use a stool softener, such as Colace (over the counter) 100 mg twice a day.  Use MiraLax (over the counter) for constipation as needed.   Diet - low sodium heart healthy   Complete by: As directed    Discharge instructions   Complete by: As directed    Dr. Gaynelle Arabian Total Joint Specialist [facility] [address] [place], [place] [address] [phone]  TOTAL KNEE REPLACEMENT POSTOPERATIVE DIRECTIONS  Knee Rehabilitation, Guidelines Following Surgery  Results after knee surgery are often greatly improved when you follow the exercise, range of motion and muscle strengthening exercises prescribed by your doctor. Safety measures are also important to protect the knee from further injury. Any time any of these exercises cause you to have increased pain or swelling in your knee joint, decrease the amount until you are comfortable again and slowly increase them. If you have problems or questions, call your caregiver or physical therapist for advice.   HOME CARE INSTRUCTIONS  Remove items at home which could result in a fall. This includes throw rugs or furniture in walking pathways.  ICE to the affected knee every three hours for 30 minutes at a time and then as needed for pain and swelling.  Continue to use ice on the knee for pain and swelling from  surgery. You may notice swelling that will progress down to the foot and ankle.  This is normal after surgery.  Elevate the leg when you are not up walking on it.   Continue to use the breathing machine which will help keep your temperature down.  It is common for your temperature to cycle up and down following surgery, especially at night when you are not up moving around and exerting yourself.  The breathing machine keeps your lungs expanded and your temperature down. Do not place pillow under knee, focus on keeping the knee straight while resting   DIET You may resume your previous home diet once your are discharged from  the hospital.  DRESSING / WOUND CARE / SHOWERING You may change your dressing 3-5 days after surgery.  Then change the dressing every day with sterile gauze.  Please use good hand washing techniques before changing the dressing.  Do not use any lotions or creams on the incision until instructed by your surgeon. You may start showering once you are discharged home but do not submerge the incision under water. Just pat the incision dry and apply a dry gauze dressing on daily. Change the surgical dressing daily and reapply a dry dressing each time.  ACTIVITY Walk with your walker as instructed. Use walker as long as suggested by your caregivers. Avoid periods of inactivity such as sitting longer than an hour when not asleep. This helps prevent blood clots.  You may resume a sexual relationship in one month or when given the OK by your doctor.  You may return to work once you are cleared by your doctor.  Do not drive a car for 6 weeks or until released by you surgeon.  Do not drive while taking narcotics.  WEIGHT BEARING Weight bearing as tolerated with assist device (walker, cane, etc) as directed, use it as long as suggested by your surgeon or therapist, typically at least 4-6 weeks.  POSTOPERATIVE CONSTIPATION PROTOCOL Constipation - defined medically as fewer than three  stools per week and severe constipation as less than one stool per week.  One of the most common issues patients have following surgery is constipation.  Even if you have a regular bowel pattern at home, your normal regimen is likely to be disrupted due to multiple reasons following surgery.  Combination of anesthesia, postoperative narcotics, change in appetite and fluid intake all can affect your bowels.  In order to avoid complications following surgery, here are some recommendations in order to help you during your recovery period.  Colace (docusate) - Pick up an over-the-counter form of Colace or another stool softener and take twice a day as long as you are requiring postoperative pain medications.  Take with a full glass of water daily.  If you experience loose stools or diarrhea, hold the colace until you stool forms back up.  If your symptoms do not get better within 1 week or if they get worse, check with your doctor.  Dulcolax (bisacodyl) - Pick up over-the-counter and take as directed by the product packaging as needed to assist with the movement of your bowels.  Take with a full glass of water.  Use this product as needed if not relieved by Colace only.   MiraLax (polyethylene glycol) - Pick up over-the-counter to have on hand.  MiraLax is a solution that will increase the amount of water in your bowels to assist with bowel movements.  Take as directed and can mix with a glass of water, juice, soda, coffee, or tea.  Take if you go more than two days without a movement. Do not use MiraLax more than once per day. Call your doctor if you are still constipated or irregular after using this medication for 7 days in a row.  If you continue to have problems with postoperative constipation, please contact the office for further assistance and recommendations.  If you experience "the worst abdominal pain ever" or develop nausea or vomiting, please contact the office immediatly for further  recommendations for treatment.  ITCHING  If you experience itching with your medications, try taking only a single pain pill, or even half a pain pill at a time.  You can also use Benadryl over the counter for itching or also to help with sleep.   TED HOSE STOCKINGS Wear the elastic stockings on both legs for three weeks following surgery during the day but you may remove then at night for sleeping.  MEDICATIONS See your medication summary on the "After Visit Summary" that the nursing staff will review with you prior to discharge.  You may have some home medications which will be placed on hold until you complete the course of blood thinner medication.  It is important for you to complete the blood thinner medication as prescribed by your surgeon.  Continue your approved medications as instructed at time of discharge.  PRECAUTIONS If you experience chest pain or shortness of breath - call 911 immediately for transfer to the hospital emergency department.  If you develop a fever greater that 101 F, purulent drainage from wound, increased redness or drainage from wound, foul odor from the wound/dressing, or calf pain - CONTACT YOUR SURGEON.                                                   FOLLOW-UP APPOINTMENTS Make sure you keep all of your appointments after your operation with your surgeon and caregivers. You should call the office at the above phone number and make an appointment for approximately two weeks after the date of your surgery or on the date instructed by your surgeon outlined in the "After Visit Summary".   RANGE OF MOTION AND STRENGTHENING EXERCISES  Rehabilitation of the knee is important following a knee injury or an operation. After just a few days of immobilization, the muscles of the thigh which control the knee become weakened and shrink (atrophy). Knee exercises are designed to build up the tone and strength of the thigh muscles and to improve knee motion. Often times heat  used for twenty to thirty minutes before working out will loosen up your tissues and help with improving the range of motion but do not use heat for the first two weeks following surgery. These exercises can be done on a training (exercise) mat, on the floor, on a table or on a bed. Use what ever works the best and is most comfortable for you Knee exercises include:  Leg Lifts - While your knee is still immobilized in a splint or cast, you can do straight leg raises. Lift the leg to 60 degrees, hold for 3 sec, and slowly lower the leg. Repeat 10-20 times 2-3 times daily. Perform this exercise against resistance later as your knee gets better.  Quad and Hamstring Sets - Tighten up the muscle on the front of the thigh (Quad) and hold for 5-10 sec. Repeat this 10-20 times hourly. Hamstring sets are done by pushing the foot backward against an object and holding for 5-10 sec. Repeat as with quad sets.  Leg Slides: Lying on your back, slowly slide your foot toward your buttocks, bending your knee up off the floor (only go as far as is comfortable). Then slowly slide your foot back down until your leg is flat on the floor again. Angel Wings: Lying on your back spread your legs to the side as far apart as you can without causing discomfort.  A rehabilitation program following serious knee injuries can speed recovery and prevent re-injury in the future due to weakened  muscles. Contact your doctor or a physical therapist for more information on knee rehabilitation.   IF YOU ARE TRANSFERRED TO A SKILLED REHAB FACILITY If the patient is transferred to a skilled rehab facility following release from the hospital, a list of the current medications will be sent to the facility for the patient to continue.  When discharged from the skilled rehab facility, please have the facility set up the patient's [place] prior to being released. Also, the skilled facility will be responsible for providing the  patient with their medications at time of release from the facility to include their pain medication, the muscle relaxants, and their blood thinner medication. If the patient is still at the rehab facility at time of the two week follow up appointment, the skilled rehab facility will also need to assist the patient in arranging follow up appointment in our office and any transportation needs.  MAKE SURE YOU:  Understand these instructions.  Get help right away if you are not doing well or get worse.    Pick up stool softner and laxative for home use following surgery while on pain medications. Do not submerge incision under water. Please use good hand washing techniques while changing dressing each day. May shower starting three days after surgery. Please use a clean towel to pat the incision dry following showers. Continue to use ice for pain and swelling after surgery. Do not use any lotions or creams on the incision until instructed by your surgeon.   Do not put a pillow under the knee. Place it under the heel.   Complete by: As directed    Driving restrictions   Complete by: As directed    No driving for two weeks   TED hose   Complete by: As directed    Use stockings (TED hose) for three weeks on both leg(s).  You may remove them at night for sleeping.   Weight bearing as tolerated   Complete by: As directed      Allergies as of 01/29/2019      Reactions   Lodine [etodolac] Hives      Medication List    TAKE these medications   albuterol 108 (90 Base) MCG/ACT inhaler Commonly known as: VENTOLIN HFA Inhale 2 puffs into the lungs every 4 (four) hours as needed for wheezing or shortness of breath.   amiodarone 200 MG tablet Commonly known as: PACERONE Take 1 tablet (200 mg total) by mouth daily.   apixaban 5 MG Tabs tablet Commonly known as: ELIQUIS Take 1 tablet (5 mg total) by mouth 2 (two) times daily.   diltiazem 120 MG tablet Commonly known as: CARDIZEM Take 120  mg by mouth daily.   fluticasone 50 MCG/ACT nasal spray Commonly known as: FLONASE Place 2 sprays into both nostrils as needed for allergies or rhinitis.   furosemide 40 MG tablet Commonly known as: LASIX Take 1 tablet (40 mg total) by mouth 2 (two) times daily.   gabapentin 300 MG capsule Commonly known as: NEURONTIN Take 1 capsule (300 mg total) by mouth 3 (three) times daily. Take a 300 mg capsule three times a day for two weeks following surgery.Then take a 300 mg capsule two times a day for two weeks. Then take a 300 mg capsule once a day for two weeks. Then discontinue.   methocarbamol 500 MG tablet Commonly known as: ROBAXIN Take 1 tablet (500 mg total) by mouth every 6 (six) hours as needed for muscle spasms.  metoprolol succinate 25 MG 24 hr tablet Commonly known as: Toprol XL Take 1 tablet (25 mg total) by mouth at bedtime.   OVER THE COUNTER MEDICATION Take 0.05 drops by mouth daily at 2 PM. CBD oil 240 mg on the bottle   oxyCODONE 5 MG immediate release tablet Commonly known as: Oxy IR/ROXICODONE Take 1-2 tablets (5-10 mg total) by mouth every 6 (six) hours as needed for severe pain.   Symbicort 80-4.5 MCG/ACT inhaler Generic drug: budesonide-formoterol Inhale 2 puffs into the lungs 2 (two) times daily.   traMADol 50 MG tablet Commonly known as: ULTRAM Take 1-2 tablets (50-100 mg total) by mouth every 6 (six) hours as needed for moderate pain.   umeclidinium bromide 62.5 MCG/INH Aepb Commonly known as: INCRUSE ELLIPTA Inhale 1 puff into the lungs daily.   varenicline 0.5 MG X 11 & 1 MG X 42 tablet Commonly known as: CHANTIX PAK Take 0.5 mg tab by mouth daily for 3 days, then increase to 0.5 mg tab twice daily for 4 days, then increase to one 1 mg tab twice daily.            Discharge Care Instructions  (From admission, onward)         Start     Ordered   01/29/19 0000  Weight bearing as tolerated     01/29/19 0718   01/29/19 0000  Change dressing     Comments: Change dressing on Wednesday, then change the dressing daily with sterile 4 x 4 inch gauze dressing and apply TED hose.   01/29/19 [id number]         Follow-up Information    Gaynelle Arabian, MD. Schedule an appointment as soon as possible for a visit on 02/12/2019.   Specialty: Orthopedic Surgery Contact information: [address] [place] [address] [id number]           Signed: Theresa Duty, PA-C Orthopedic Surgery 01/30/2019, 7:27 AM

## 2019-01-31 DIAGNOSIS — M25561 Pain in right knee: Secondary | ICD-10-CM | POA: Diagnosis not present

## 2019-02-06 ENCOUNTER — Other Ambulatory Visit: Payer: Self-pay | Admitting: Internal Medicine

## 2019-02-06 DIAGNOSIS — M25561 Pain in right knee: Secondary | ICD-10-CM | POA: Diagnosis not present

## 2019-02-06 NOTE — Telephone Encounter (Signed)
Pt last saw Dr Curt Bears 01/24/19, last labs 01/29/19 Creat 0.88, age 63, weight 115.2kg, based on specified criteria pt is on appropriate dosage of Eliquis 5mg  BID.  Will refill rx.

## 2019-02-11 DIAGNOSIS — M25561 Pain in right knee: Secondary | ICD-10-CM | POA: Diagnosis not present

## 2019-02-18 DIAGNOSIS — M25561 Pain in right knee: Secondary | ICD-10-CM | POA: Diagnosis not present

## 2019-02-18 DIAGNOSIS — G4733 Obstructive sleep apnea (adult) (pediatric): Secondary | ICD-10-CM | POA: Diagnosis not present

## 2019-02-21 DIAGNOSIS — M25561 Pain in right knee: Secondary | ICD-10-CM | POA: Diagnosis not present

## 2019-02-25 DIAGNOSIS — M25561 Pain in right knee: Secondary | ICD-10-CM | POA: Diagnosis not present

## 2019-02-28 DIAGNOSIS — M25561 Pain in right knee: Secondary | ICD-10-CM | POA: Diagnosis not present

## 2019-03-04 DIAGNOSIS — M25561 Pain in right knee: Secondary | ICD-10-CM | POA: Diagnosis not present

## 2019-03-05 DIAGNOSIS — M25561 Pain in right knee: Secondary | ICD-10-CM | POA: Diagnosis not present

## 2019-03-06 ENCOUNTER — Other Ambulatory Visit: Payer: Self-pay

## 2019-03-06 ENCOUNTER — Ambulatory Visit (INDEPENDENT_AMBULATORY_CARE_PROVIDER_SITE_OTHER): Payer: BC Managed Care – PPO | Admitting: *Deleted

## 2019-03-06 DIAGNOSIS — Z23 Encounter for immunization: Secondary | ICD-10-CM | POA: Diagnosis not present

## 2019-03-06 NOTE — Progress Notes (Signed)
Patient here for second shingles vaccine.  Vaccine given in left deltoid and patient tolerated well.  

## 2019-03-07 DIAGNOSIS — M25561 Pain in right knee: Secondary | ICD-10-CM | POA: Diagnosis not present

## 2019-03-15 ENCOUNTER — Telehealth: Payer: Self-pay | Admitting: *Deleted

## 2019-03-15 DIAGNOSIS — I4819 Other persistent atrial fibrillation: Secondary | ICD-10-CM

## 2019-03-15 DIAGNOSIS — Z01812 Encounter for preprocedural laboratory examination: Secondary | ICD-10-CM

## 2019-03-15 NOTE — Telephone Encounter (Signed)
Reviewed ablation instructions w/ pt. Letter of instructions for CT & RFA left at front desk for pt pick up. Pre procedure labs scheduled for 2/8 Virtual visit w/ Camnitz 2/15. Aware office will call to arrange CT. Aware office will call to arrange post ablaiton follow up. Patient verbalized understanding and agreeable to plan.

## 2019-03-18 ENCOUNTER — Other Ambulatory Visit: Payer: BC Managed Care – PPO | Admitting: *Deleted

## 2019-03-18 ENCOUNTER — Other Ambulatory Visit: Payer: Self-pay

## 2019-03-18 DIAGNOSIS — Z01812 Encounter for preprocedural laboratory examination: Secondary | ICD-10-CM | POA: Diagnosis not present

## 2019-03-18 DIAGNOSIS — I4819 Other persistent atrial fibrillation: Secondary | ICD-10-CM | POA: Diagnosis not present

## 2019-03-18 LAB — BASIC METABOLIC PANEL
BUN/Creatinine Ratio: 9 — ABNORMAL LOW (ref 10–24)
BUN: 10 mg/dL (ref 8–27)
CO2: 21 mmol/L (ref 20–29)
Calcium: 8.9 mg/dL (ref 8.6–10.2)
Chloride: 101 mmol/L (ref 96–106)
Creatinine, Ser: 1.06 mg/dL (ref 0.76–1.27)
GFR calc Af Amer: 87 mL/min/{1.73_m2} (ref >59)
GFR calc non Af Amer: 75 mL/min/{1.73_m2} (ref >59)
Glucose: 106 mg/dL — ABNORMAL HIGH (ref 65–99)
Potassium: 4.2 mmol/L (ref 3.5–5.2)
Sodium: 140 mmol/L (ref 134–144)

## 2019-03-18 LAB — CBC
Hematocrit: 44.8 % (ref 37.5–51.0)
Hemoglobin: 14.9 g/dL (ref 13.0–17.7)
MCH: 31.1 pg (ref 26.6–33.0)
MCHC: 33.3 g/dL (ref 31.5–35.7)
MCV: 94 fL (ref 79–97)
Platelets: 256 10*3/uL (ref 150–450)
RBC: 4.79 x10E6/uL (ref 4.14–5.80)
RDW: 12.7 % (ref 11.6–15.4)
WBC: 9.7 10*3/uL (ref 3.4–10.8)

## 2019-03-19 ENCOUNTER — Telehealth: Payer: Self-pay | Admitting: Cardiology

## 2019-03-19 NOTE — Telephone Encounter (Signed)
Patient returning Christine's call in regards to lab results.

## 2019-03-20 ENCOUNTER — Telehealth: Payer: Self-pay | Admitting: Cardiology

## 2019-03-20 NOTE — Telephone Encounter (Signed)
Follow Up  Pt called back in regards to his results.  Please call back

## 2019-03-20 NOTE — Telephone Encounter (Signed)
error 

## 2019-03-20 NOTE — Telephone Encounter (Signed)
Pt advised his lab results.. having a virtual visit for updated H&P 03/25/19 with Dr. Curt Bears for pre-ablation.

## 2019-03-25 ENCOUNTER — Other Ambulatory Visit: Payer: Self-pay

## 2019-03-25 ENCOUNTER — Telehealth (INDEPENDENT_AMBULATORY_CARE_PROVIDER_SITE_OTHER): Payer: BC Managed Care – PPO | Admitting: Cardiology

## 2019-03-25 ENCOUNTER — Encounter: Payer: Self-pay | Admitting: Cardiology

## 2019-03-25 DIAGNOSIS — I4819 Other persistent atrial fibrillation: Secondary | ICD-10-CM | POA: Diagnosis not present

## 2019-03-25 NOTE — Progress Notes (Signed)
Electrophysiology TeleHealth Note   Due to national recommendations of social distancing due to COVID 19, an audio/video telehealth visit is felt to be most appropriate for this patient at this time.  See Epic message for the patient's consent to telehealth for [facility].   Date:  03/25/2019   ID:  Mitchell Parrish, DOB [birth year], MRN [id number]  Location: patient's home  Provider location: [address] [place]  Evaluation Performed: Follow-up visit  PCP:  [name], DO  Cardiologist:  Cristopher Peru, MD  Electrophysiologist:  Dr Curt Bears  Chief Complaint:  AF  History of Present Illness:    Mitchell Parrish is a 64 y.o. male who presents via audio/video conferencing for a telehealth visit today.  Since last being seen in our clinic, the patient reports doing very well.  Today, he denies symptoms of palpitations, chest pain, shortness of breath,  lower extremity edema, dizziness, presyncope, or syncope.  The patient is otherwise without complaint today.  The patient denies symptoms of fevers, chills, cough, or new SOB worrisome for COVID 19.  He has a history of persistent atrial fibrillation, diastolic heart failure, hypertension, hyperlipidemia.  He is currently on amiodarone.  He had previously developed a tachycardia induced cardiomyopathy.  His ejection fraction has since normalized.  He has plans for AF ablation.  Today, denies symptoms of palpitations, chest pain, shortness of breath, orthopnea, PND, lower extremity edema, claudication, dizziness, presyncope, syncope, bleeding, or neurologic sequela. The patient is tolerating medications without difficulties.  He overall feels well.  He has no chest pain or shortness of breath.  He is able to do all of his daily activities.  He does not feel that he is gone back into atrial fibrillation.  He is remained in sinus rhythm.  Past Medical History:  Diagnosis Date   Acute diastolic CHF (congestive  heart failure) ([place]) 05/29/2018   Arthritis    left hip   Atrial fibrillation ([place])    Hyperlipidemia    Hypertension    Tubular adenoma of colon 11/2011    Past Surgical History:  Procedure Laterality Date   BACK SURGERY     CARDIOVERSION N/A 06/25/2018   Procedure: CARDIOVERSION (CATH LAB);  Surgeon: Evans Lance, MD;  Location: [facility];  Service: Cardiovascular;  Laterality: N/A;   HERNIA REPAIR     SPINE SURGERY     cervical fusion c6, c7 (1994), and c3,4,5,6 (2004)   TOTAL KNEE ARTHROPLASTY Right 01/28/2019   Procedure: TOTAL KNEE ARTHROPLASTY;  Surgeon: Gaynelle Arabian, MD;  Location: [facility] ORS;  Service: Orthopedics;  Laterality: Right;   WISDOM TOOTH EXTRACTION      Current Outpatient Medications  Medication Sig Dispense Refill   albuterol (VENTOLIN HFA) 108 (90 Base) MCG/ACT inhaler Inhale 2 puffs into the lungs every 4 (four) hours as needed for wheezing or shortness of breath. 18 g 5   amiodarone (PACERONE) 200 MG tablet Take 1 tablet (200 mg total) by mouth daily. 90 tablet 3   diltiazem (CARDIZEM) 120 MG tablet Take 120 mg by mouth daily.     ELIQUIS 5 MG TABS tablet TAKE 1 TABLET BY MOUTH TWICE DAILY 60 tablet 6   fluticasone (FLONASE) 50 MCG/ACT nasal spray Place 2 sprays into both nostrils as needed for allergies or rhinitis. 16 g 5   furosemide (LASIX) 40 MG tablet Take 1 tablet (40 mg total) by mouth 2 (two) times daily. 180 tablet 3   gabapentin (NEURONTIN)  300 MG capsule Take 1 capsule (300 mg total) by mouth 3 (three) times daily. Take a 300 mg capsule three times a day for two weeks following surgery.Then take a 300 mg capsule two times a day for two weeks. Then take a 300 mg capsule once a day for two weeks. Then discontinue. 84 capsule 0   methocarbamol (ROBAXIN) 500 MG tablet Take 1 tablet (500 mg total) by mouth every 6 (six) hours as needed for muscle spasms. 40 tablet 0   metoprolol succinate (TOPROL XL) 25 MG 24 hr tablet Take 1  tablet (25 mg total) by mouth at bedtime. 90 tablet 3   OVER THE COUNTER MEDICATION Take 0.05 drops by mouth daily at 2 PM. CBD oil 240 mg on the bottle      oxyCODONE (OXY IR/ROXICODONE) 5 MG immediate release tablet Take 1-2 tablets (5-10 mg total) by mouth every 6 (six) hours as needed for severe pain. 56 tablet 0   SYMBICORT 80-4.5 MCG/ACT inhaler Inhale 2 puffs into the lungs 2 (two) times daily. 10.2 g 5   traMADol (ULTRAM) 50 MG tablet Take 1-2 tablets (50-100 mg total) by mouth every 6 (six) hours as needed for moderate pain. 40 tablet 0   umeclidinium bromide (INCRUSE ELLIPTA) 62.5 MCG/INH AEPB Inhale 1 puff into the lungs daily. 30 each 5   varenicline (CHANTIX PAK) 0.5 MG X 11 & 1 MG X 42 tablet Take 0.5 mg tab by mouth daily for 3 days, then increase to 0.5 mg tab twice daily for 4 days, then increase to one 1 mg tab twice daily. 53 tablet 0   No current facility-administered medications for this visit.    Allergies:   Lodine [etodolac]   Social History:  The patient  reports that he has been smoking cigarettes. He has a 45.00 pack-year smoking history. He has never used smokeless tobacco. He reports current alcohol use. He reports that he does not use drugs.   Family History:  The patient's  family history includes Colon cancer (age of onset: 66) in his father; Diabetes in his father and mother; Heart disease in his father; Lung cancer (age of onset: 101) in his mother.   ROS:  Please see the history of present illness.   All other systems are personally reviewed and negative.    Exam:    Vital Signs:  Pulse 71   no acute distress, no shortness of breath.  Labs/Other Tests and Data Reviewed:    Recent Labs: 05/25/2018: B Natriuretic Peptide 198.8 05/27/2018: TSH 1.760 06/28/2018: Magnesium 2.3 01/25/2019: ALT 22 03/18/2019: BUN 10; Creatinine, Ser 1.06; Hemoglobin 14.9; Platelets 256; Potassium 4.2; Sodium 140   Wt Readings from Last 3 Encounters:  01/28/19 254 lb  (115.2 kg)  01/24/19 258 lb (117 kg)  01/24/19 253 lb (114.8 kg)     Other studies personally reviewed: Additional studies/ records that were reviewed today include: ECG 01/01/2019 personally reviewed Review of the above records today demonstrates: Sinus rhythm, rate 64   ASSESSMENT & PLAN:    1.  Persistent atrial fibrillation: Currently on Eliquis.  CHA2DS2-VASc of 1.  Also on amiodarone.  Due to his persistent atrial fibrillation and history of tachycardia induced cardiomyopathy, he would benefit from ablation.  Risks and benefits were discussed which include bleeding, tamponade, heart block, stroke, damage to chest organs.  He understands these risks and is agreed to the procedure.  2.  Hypertension: Unable to check his blood pressure as he does not have  a cuff at home.  No changes.  3.  Systolic heart failure: Likely due to a tachycardia induced cardiomyopathy.  Fortunately his ejection fraction is improved to normal with maintenance of sinus rhythm.  Plan for AF ablation.  COVID 19 screen The patient denies symptoms of COVID 19 at this time.  The importance of social distancing was discussed today.  Follow-up: 3 months   Current medicines are reviewed at length with the patient today.   The patient does not have concerns regarding his medicines.  The following changes were made today:  none  Labs/ tests ordered today include:  No orders of the defined types were placed in this encounter.    Patient Risk:  after full review of this patients clinical status, I feel that they are at moderate risk at this time.  Today, I have spent 8 minutes with the patient with telehealth technology discussing atrial fibrillation.    Signed, Anas Reister Meredith Leeds, MD  03/25/2019 10:49 AM     [facility] [address] [place] [address] [phone] (office) [phone] (fax)

## 2019-03-27 ENCOUNTER — Encounter (HOSPITAL_COMMUNITY): Payer: Self-pay

## 2019-03-28 ENCOUNTER — Telehealth (HOSPITAL_COMMUNITY): Payer: Self-pay | Admitting: Emergency Medicine

## 2019-03-28 NOTE — Telephone Encounter (Signed)
Left message on voicemail with name and callback number Bassy Fetterly RN Navigator Cardiac Imaging [facility] [phone] Office [phone] Cell  

## 2019-03-29 ENCOUNTER — Ambulatory Visit (HOSPITAL_COMMUNITY)
Admission: RE | Admit: 2019-03-29 | Discharge: 2019-03-29 | Disposition: A | Discharge status: Home / Self Care | Payer: BC Managed Care – PPO | Source: Ambulatory Visit | Attending: Cardiology | Admitting: Cardiology

## 2019-03-29 ENCOUNTER — Other Ambulatory Visit: Payer: Self-pay

## 2019-03-29 DIAGNOSIS — I4819 Other persistent atrial fibrillation: Secondary | ICD-10-CM | POA: Diagnosis not present

## 2019-03-29 MED ORDER — IOHEXOL 350 MG/ML SOLN
80.0000 mL | Freq: Once | INTRAVENOUS | Status: AC | PRN
  Administered 2019-03-29: 80 mL via INTRAVENOUS

## 2019-03-30 ENCOUNTER — Other Ambulatory Visit (HOSPITAL_COMMUNITY)
Admission: RE | Admit: 2019-03-30 | Discharge: 2019-03-30 | Disposition: A | Discharge status: Home / Self Care | Payer: BC Managed Care – PPO | Source: Ambulatory Visit | Attending: Cardiology | Admitting: Cardiology

## 2019-03-30 DIAGNOSIS — Z01812 Encounter for preprocedural laboratory examination: Secondary | ICD-10-CM | POA: Insufficient documentation

## 2019-03-30 DIAGNOSIS — Z20822 Contact with and (suspected) exposure to covid-19: Secondary | ICD-10-CM | POA: Diagnosis not present

## 2019-03-30 LAB — SARS CORONAVIRUS 2 (TAT 6-24 HRS): SARS Coronavirus 2: NEGATIVE

## 2019-04-01 ENCOUNTER — Ambulatory Visit: Payer: BC Managed Care – PPO | Admitting: Internal Medicine

## 2019-04-02 NOTE — Anesthesia Preprocedure Evaluation (Addendum)
Anesthesia Evaluation  Patient identified by MRN, date of birth, ID band Patient awake    Reviewed: Allergy & Precautions, NPO status , Patient's Chart, lab work & pertinent test results  History of Anesthesia Complications Negative for: history of anesthetic complications  Airway Mallampati: II  TM Distance: >3 FB Neck ROM: Full    Dental  (+) Dental Advisory Given, Chipped,    Pulmonary sleep apnea and Continuous Positive Airway Pressure Ventilation , COPD,  COPD inhaler, Current SmokerPatient did not abstain from smoking.,    Pulmonary exam normal        Cardiovascular hypertension, Pt. on home beta blockers and Pt. on medications +CHF (diastolic)  + dysrhythmias Atrial Fibrillation  Rhythm:Irregular Rate:Normal   '20 TTE - EF 60 to 65%. Grade II diastolic dysfunction (pseudonormalization).  Trace MR and TR. Mild dilatation of the aortic root measuring 38 mm.     Neuro/Psych negative neurological ROS  negative psych ROS   GI/Hepatic negative GI ROS,  Gilbert's syndrome    Endo/Other   Obesity   Renal/GU negative Renal ROS     Musculoskeletal  (+) Arthritis , Osteoarthritis,    Abdominal   Peds  Hematology  On eliquis    Anesthesia Other Findings Covid neg 03/30/19   Reproductive/Obstetrics                            Anesthesia Physical Anesthesia Plan  ASA: III  Anesthesia Plan: General   Post-op Pain Management:    Induction: Intravenous  PONV Risk Score and Plan: 2 and Treatment may vary due to age or medical condition, Ondansetron, Dexamethasone and Midazolam  Airway Management Planned: Oral ETT  Additional Equipment: None  Intra-op Plan:   Post-operative Plan: Extubation in OR  Informed Consent: I have reviewed the patients History and Physical, chart, labs and discussed the procedure including the risks, benefits and alternatives for the proposed anesthesia  with the patient or authorized representative who has indicated his/her understanding and acceptance.     Dental advisory given  Plan Discussed with: CRNA and Anesthesiologist  Anesthesia Plan Comments:        Anesthesia Quick Evaluation

## 2019-04-02 NOTE — Progress Notes (Signed)
Instructed patient on the following items: Arrival time 0630 Nothing to eat or drink after midnight No meds AM of procedure Responsible person to drive you home and stay with you for 24 hrs  Have you missed any doses of anti-coagulant Eliquis- No

## 2019-04-03 ENCOUNTER — Other Ambulatory Visit: Payer: Self-pay

## 2019-04-03 ENCOUNTER — Ambulatory Visit (HOSPITAL_COMMUNITY)
Admission: RE | Admit: 2019-04-03 | Discharge: 2019-04-03 | Disposition: A | Discharge status: Home / Self Care | Payer: BC Managed Care – PPO | Attending: Cardiology | Admitting: Cardiology

## 2019-04-03 ENCOUNTER — Ambulatory Visit (HOSPITAL_COMMUNITY): Payer: BC Managed Care – PPO | Admitting: Certified Registered Nurse Anesthetist

## 2019-04-03 ENCOUNTER — Encounter (HOSPITAL_COMMUNITY)
Admission: RE | Disposition: A | Discharge status: Home / Self Care | Payer: Self-pay | Source: Home / Self Care | Attending: Cardiology

## 2019-04-03 DIAGNOSIS — I4892 Unspecified atrial flutter: Secondary | ICD-10-CM | POA: Diagnosis not present

## 2019-04-03 DIAGNOSIS — Z8249 Family history of ischemic heart disease and other diseases of the circulatory system: Secondary | ICD-10-CM | POA: Insufficient documentation

## 2019-04-03 DIAGNOSIS — I110 Hypertensive heart disease with heart failure: Secondary | ICD-10-CM | POA: Insufficient documentation

## 2019-04-03 DIAGNOSIS — M1612 Unilateral primary osteoarthritis, left hip: Secondary | ICD-10-CM | POA: Insufficient documentation

## 2019-04-03 DIAGNOSIS — Z888 Allergy status to other drugs, medicaments and biological substances status: Secondary | ICD-10-CM | POA: Diagnosis not present

## 2019-04-03 DIAGNOSIS — Z7951 Long term (current) use of inhaled steroids: Secondary | ICD-10-CM | POA: Diagnosis not present

## 2019-04-03 DIAGNOSIS — E785 Hyperlipidemia, unspecified: Secondary | ICD-10-CM | POA: Diagnosis not present

## 2019-04-03 DIAGNOSIS — Z7901 Long term (current) use of anticoagulants: Secondary | ICD-10-CM | POA: Diagnosis not present

## 2019-04-03 DIAGNOSIS — I5033 Acute on chronic diastolic (congestive) heart failure: Secondary | ICD-10-CM | POA: Diagnosis not present

## 2019-04-03 DIAGNOSIS — F17200 Nicotine dependence, unspecified, uncomplicated: Secondary | ICD-10-CM | POA: Insufficient documentation

## 2019-04-03 DIAGNOSIS — I4819 Other persistent atrial fibrillation: Secondary | ICD-10-CM | POA: Insufficient documentation

## 2019-04-03 DIAGNOSIS — I5031 Acute diastolic (congestive) heart failure: Secondary | ICD-10-CM | POA: Diagnosis not present

## 2019-04-03 DIAGNOSIS — Z79899 Other long term (current) drug therapy: Secondary | ICD-10-CM | POA: Insufficient documentation

## 2019-04-03 DIAGNOSIS — I4891 Unspecified atrial fibrillation: Secondary | ICD-10-CM | POA: Diagnosis not present

## 2019-04-03 HISTORY — PX: ATRIAL FIBRILLATION ABLATION: EP1191

## 2019-04-03 LAB — POCT ACTIVATED CLOTTING TIME
Activated Clotting Time: 274 seconds
Activated Clotting Time: 318 seconds

## 2019-04-03 SURGERY — ATRIAL FIBRILLATION ABLATION
Anesthesia: General

## 2019-04-03 MED ORDER — PROTAMINE SULFATE 10 MG/ML IV SOLN
INTRAVENOUS | Status: DC | PRN
  Administered 2019-04-03: 50 mg via INTRAVENOUS

## 2019-04-03 MED ORDER — DEXAMETHASONE SODIUM PHOSPHATE 10 MG/ML IJ SOLN
INTRAMUSCULAR | Status: DC | PRN
  Administered 2019-04-03: 10 mg via INTRAVENOUS

## 2019-04-03 MED ORDER — DOBUTAMINE IN D5W 4-5 MG/ML-% IV SOLN
INTRAVENOUS | Status: DC | PRN
  Administered 2019-04-03: 20 ug/kg/min via INTRAVENOUS

## 2019-04-03 MED ORDER — FENTANYL CITRATE (PF) 100 MCG/2ML IJ SOLN: 25.0000 ug | INTRAMUSCULAR | Status: DC | PRN

## 2019-04-03 MED ORDER — HEPARIN (PORCINE) IN NACL 1000-0.9 UT/500ML-% IV SOLN
INTRAVENOUS | Status: AC
  Filled 2019-04-03: qty 2500

## 2019-04-03 MED ORDER — ONDANSETRON HCL 4 MG/2ML IJ SOLN: 4.0000 mg | Freq: Four times a day (QID) | INTRAMUSCULAR | Status: DC | PRN

## 2019-04-03 MED ORDER — HEPARIN SODIUM (PORCINE) 1000 UNIT/ML IJ SOLN
INTRAMUSCULAR | Status: DC | PRN
  Administered 2019-04-03: 3000 [IU] via INTRAVENOUS
  Administered 2019-04-03: 14000 [IU] via INTRAVENOUS
  Administered 2019-04-03: 5000 [IU] via INTRAVENOUS

## 2019-04-03 MED ORDER — PROPOFOL 10 MG/ML IV BOLUS
INTRAVENOUS | Status: DC | PRN
  Administered 2019-04-03: 180 mg via INTRAVENOUS

## 2019-04-03 MED ORDER — SODIUM CHLORIDE 0.9 % IV SOLN: INTRAVENOUS | Status: DC

## 2019-04-03 MED ORDER — SUCCINYLCHOLINE CHLORIDE 200 MG/10ML IV SOSY
PREFILLED_SYRINGE | INTRAVENOUS | Status: DC | PRN
  Administered 2019-04-03: 120 mg via INTRAVENOUS

## 2019-04-03 MED ORDER — OXYCODONE HCL 5 MG/5ML PO SOLN: 5.0000 mg | Freq: Once | ORAL | Status: DC | PRN

## 2019-04-03 MED ORDER — DOBUTAMINE IN D5W 4-5 MG/ML-% IV SOLN
INTRAVENOUS | Status: AC
  Filled 2019-04-03: qty 250

## 2019-04-03 MED ORDER — SODIUM CHLORIDE 0.9 % IV SOLN: 250.0000 mL | INTRAVENOUS | Status: DC | PRN

## 2019-04-03 MED ORDER — PROMETHAZINE HCL 25 MG/ML IJ SOLN: 6.2500 mg | INTRAMUSCULAR | Status: DC | PRN

## 2019-04-03 MED ORDER — HEPARIN SODIUM (PORCINE) 1000 UNIT/ML IJ SOLN
INTRAMUSCULAR | Status: AC
  Filled 2019-04-03: qty 1

## 2019-04-03 MED ORDER — LIDOCAINE 2% (20 MG/ML) 5 ML SYRINGE
INTRAMUSCULAR | Status: DC | PRN
  Administered 2019-04-03: 100 mg via INTRAVENOUS

## 2019-04-03 MED ORDER — ACETAMINOPHEN 325 MG PO TABS
650.0000 mg | ORAL_TABLET | ORAL | Status: DC | PRN
  Filled 2019-04-03: qty 2

## 2019-04-03 MED ORDER — HEPARIN SODIUM (PORCINE) 1000 UNIT/ML IJ SOLN
INTRAMUSCULAR | Status: DC | PRN
  Administered 2019-04-03: 1000 [IU] via INTRAVENOUS

## 2019-04-03 MED ORDER — ONDANSETRON HCL 4 MG/2ML IJ SOLN
INTRAMUSCULAR | Status: DC | PRN
  Administered 2019-04-03: 4 mg via INTRAVENOUS

## 2019-04-03 MED ORDER — FENTANYL CITRATE (PF) 250 MCG/5ML IJ SOLN
INTRAMUSCULAR | Status: DC | PRN
  Administered 2019-04-03: 50 ug via INTRAVENOUS

## 2019-04-03 MED ORDER — HEPARIN (PORCINE) IN NACL 1000-0.9 UT/500ML-% IV SOLN
INTRAVENOUS | Status: DC | PRN
  Administered 2019-04-03 (×5): 500 mL

## 2019-04-03 MED ORDER — MIDAZOLAM HCL 2 MG/2ML IJ SOLN
INTRAMUSCULAR | Status: DC | PRN
  Administered 2019-04-03 (×2): 1 mg via INTRAVENOUS

## 2019-04-03 MED ORDER — SODIUM CHLORIDE 0.9% FLUSH: 3.0000 mL | INTRAVENOUS | Status: DC | PRN

## 2019-04-03 MED ORDER — OXYCODONE HCL 5 MG PO TABS: 5.0000 mg | ORAL_TABLET | Freq: Once | ORAL | Status: DC | PRN

## 2019-04-03 MED ORDER — SUGAMMADEX SODIUM 200 MG/2ML IV SOLN
INTRAVENOUS | Status: DC | PRN
  Administered 2019-04-03: 200 mg via INTRAVENOUS

## 2019-04-03 MED ORDER — ROCURONIUM BROMIDE 10 MG/ML (PF) SYRINGE
PREFILLED_SYRINGE | INTRAVENOUS | Status: DC | PRN
  Administered 2019-04-03: 70 mg via INTRAVENOUS
  Administered 2019-04-03: 20 mg via INTRAVENOUS

## 2019-04-03 SURGICAL SUPPLY — 22 items
BLANKET WARM UNDERBOD FULL ACC (MISCELLANEOUS) ×2 IMPLANT
CATH MAPPNG PENTARAY F 2-6-2MM (CATHETERS) IMPLANT
CATH SMTCH THERMOCOOL SF DF (CATHETERS) ×1 IMPLANT
CATH SOUNDSTAR ECO 8FR (CATHETERS) ×1 IMPLANT
CATH WEBSTER BI DIR CS D-F CRV (CATHETERS) ×1 IMPLANT
COVER SWIFTLINK CONNECTOR (BAG) ×2 IMPLANT
DEVICE CLOSURE PERCLS PRGLD 6F (VASCULAR PRODUCTS) IMPLANT
PACK EP LATEX FREE (CUSTOM PROCEDURE TRAY) ×1
PACK EP LF (CUSTOM PROCEDURE TRAY) ×1 IMPLANT
PAD PRO RADIOLUCENT 2001M-C (PAD) ×2 IMPLANT
PATCH CARTO3 (PAD) ×1 IMPLANT
PENTARAY F 2-6-2MM (CATHETERS) ×2
PERCLOSE PROGLIDE 6F (VASCULAR PRODUCTS) ×8
SHEATH BAYLIS SUREFLEX  M 8.5 (SHEATH) ×1
SHEATH BAYLIS SUREFLEX M 8.5 (SHEATH) IMPLANT
SHEATH BAYLIS TRANSSEPTAL 98CM (NEEDLE) ×1 IMPLANT
SHEATH CARTO VIZIGO SM CVD (SHEATH) ×1 IMPLANT
SHEATH PINNACLE 7F 10CM (SHEATH) ×1 IMPLANT
SHEATH PINNACLE 8F 10CM (SHEATH) ×2 IMPLANT
SHEATH PINNACLE 9F 10CM (SHEATH) ×1 IMPLANT
SHEATH PROBE COVER 6X72 (BAG) ×1 IMPLANT
TUBING SMART ABLATE COOLFLOW (TUBING) ×1 IMPLANT

## 2019-04-03 NOTE — Discharge Instructions (Signed)
Cardiac Ablation, Care After This sheet gives you information about how to care for yourself after your procedure. Your health care provider may also give you more specific instructions. If you have problems or questions, contact your health care provider. What can I expect after the procedure? After the procedure, it is common to have:  Bruising around your puncture site.  Tenderness around your puncture site.  Skipped heartbeats.  Tiredness (fatigue). Follow these instructions at home: Puncture site care   Follow instructions from your health care provider about how to take care of your puncture site. Make sure you: ? Wash your hands with soap and water before you change your bandage (dressing). If soap and water are not available, use hand sanitizer. ? Change your dressing as told by your health care provider. ? Leave stitches (sutures), skin glue, or adhesive strips in place. These skin closures may need to stay in place for up to 2 weeks. If adhesive strip edges start to loosen and curl up, you may trim the loose edges. Do not remove adhesive strips completely unless your health care provider tells you to do that.  Check your puncture site every day for signs of infection. Check for: ? Redness, swelling, or pain. ? Fluid or blood. If your puncture site starts to bleed, lie down on your back, apply firm pressure to the area, and contact your health care provider. ? Warmth. ? Pus or a bad smell. Driving  Ask your health care provider when it is safe for you to drive again after the procedure.  Do not drive or use heavy machinery while taking prescription pain medicine.  Do not drive for 24 hours if you were given a medicine to help you relax (sedative) during your procedure. Activity  Avoid activities that take a lot of effort for at least 3 days after your procedure.  Do not lift anything that is heavier than 10 lb (4.5 kg), or the limit that you are told, until your health  care provider says that it is safe.  Return to your normal activities as told by your health care provider. Ask your health care provider what activities are safe for you. General instructions  Take over-the-counter and prescription medicines only as told by your health care provider.  Do not use any products that contain nicotine or tobacco, such as cigarettes and e-cigarettes. If you need help quitting, ask your health care provider.  Do not take baths, swim, or use a hot tub until your health care provider approves.  Do not drink alcohol for 24 hours after your procedure.  Keep all follow-up visits as told by your health care provider. This is important. Contact a health care provider if:  You have redness, mild swelling, or pain around your puncture site.  You have fluid or blood coming from your puncture site that stops after applying firm pressure to the area.  Your puncture site feels warm to the touch.  You have pus or a bad smell coming from your puncture site.  You have a fever.  You have chest pain or discomfort that spreads to your neck, jaw, or arm.  You are sweating a lot.  You feel nauseous.  You have a fast or irregular heartbeat.  You have shortness of breath.  You are dizzy or light-headed and feel the need to lie down.  You have pain or numbness in the arm or leg closest to your puncture site. Get help right away if:  Your puncture   site suddenly swells.  Your puncture site is bleeding and the bleeding does not stop after applying firm pressure to the area. These symptoms may represent a serious problem that is an emergency. Do not wait to see if the symptoms will go away. Get medical help right away. Call your local emergency services (911 in the [place] Do not drive yourself to the hospital. Summary  After the procedure, it is normal to have bruising and tenderness at the puncture site in your groin, neck, or forearm.  Check your puncture site every  day for signs of infection.  Get help right away if your puncture site is bleeding and the bleeding does not stop after applying firm pressure to the area. This is a medical emergency. This information is not intended to replace advice given to you by your health care provider. Make sure you discuss any questions you have with your health care provider. Document Revised: 01/06/2017 Document Reviewed: 05/05/2016 Elsevier Patient Education  2020 [employer]  

## 2019-04-03 NOTE — Progress Notes (Signed)
Pt has ambulated without difficulty.  VSS. Dr Curt Bears is in procedure but states he wants to see pt before discharge.  Report given to Irving Burton

## 2019-04-03 NOTE — Anesthesia Postprocedure Evaluation (Signed)
Anesthesia Post Note  Patient: Mitchell Parrish  Procedure(s) Performed: ATRIAL FIBRILLATION ABLATION (N/A )     Patient location during evaluation: PACU Anesthesia Type: General Level of consciousness: awake and alert Pain management: pain level controlled Vital Signs Assessment: post-procedure vital signs reviewed and stable Respiratory status: spontaneous breathing, nonlabored ventilation and respiratory function stable Cardiovascular status: blood pressure returned to baseline and stable Postop Assessment: no apparent nausea or vomiting Anesthetic complications: no    Last Vitals:  Vitals:   04/03/19 1223 04/03/19 1238  BP: 129/76 138/78  Pulse: 65 66  Resp: 17 14  Temp:  (!) 36.2 C  SpO2: 100% 96%    Last Pain:  Vitals:   04/03/19 1238  TempSrc:   PainSc: 0-No pain                 Audry Pili

## 2019-04-03 NOTE — H&P (Signed)
Mitchell Parrish has presented today for surgery, with the diagnosis of atrial fibrillation.  The various methods of treatment have been discussed with the patient and family. After consideration of risks, benefits and other options for treatment, the patient has consented to  Procedure(s): Catheter ablation as a surgical intervention .  Risks include but not limited to bleeding, tamponade, heart block, stroke, damage to surrounding organs, among others. The patient's history has been reviewed, patient examined, no change in status, stable for surgery.  I have reviewed the patient's chart and labs.  Questions were answered to the patient's satisfaction.    Wajiha Versteeg Curt Bears, MD 04/03/2019 7:57 AM

## 2019-04-03 NOTE — Anesthesia Procedure Notes (Signed)
Procedure Name: Intubation Date/Time: 04/03/2019 8:52 AM Performed by: Valda Favia, CRNA Pre-anesthesia Checklist: Patient identified, Emergency Drugs available, Suction available and Patient being monitored Patient Re-evaluated:Patient Re-evaluated prior to induction Oxygen Delivery Method: Circle System Utilized Preoxygenation: Pre-oxygenation with 100% oxygen Induction Type: IV induction Ventilation: Mask ventilation without difficulty Laryngoscope Size: Mac and 4 Grade View: Grade III Tube type: Oral Tube size: 7.5 mm Number of attempts: 1 Airway Equipment and Method: Stylet and Oral airway Placement Confirmation: ETT inserted through vocal cords under direct vision,  positive ETCO2 and breath sounds checked- equal and bilateral Secured at: 23 cm Tube secured with: Tape Dental Injury: Teeth and Oropharynx as per pre-operative assessment

## 2019-04-03 NOTE — Transfer of Care (Signed)
Immediate Anesthesia Transfer of Care Note  Patient: COLLAN FACCHINI  Procedure(s) Performed: ATRIAL FIBRILLATION ABLATION (N/A )  Patient Location: Cath Lab  Anesthesia Type:General  Level of Consciousness: awake, alert  and oriented  Airway & Oxygen Therapy: Patient Spontanous Breathing and Patient connected to nasal cannula oxygen  Post-op Assessment: Report given to RN and Post -op Vital signs reviewed and stable  Post vital signs: Reviewed and stable  Last Vitals:  Vitals Value Taken Time  BP 142/77 04/03/19 1133  Temp 36.5 C 04/03/19 1133  Pulse 66 04/03/19 1137  Resp 12 04/03/19 1137  SpO2 93 % 04/03/19 1137  Vitals shown include unvalidated device data.  Last Pain:  Vitals:   04/03/19 1133  TempSrc: Temporal  PainSc: 0-No pain         Complications: No apparent anesthesia complications

## 2019-04-26 ENCOUNTER — Other Ambulatory Visit: Payer: Self-pay

## 2019-04-26 ENCOUNTER — Ambulatory Visit (HOSPITAL_COMMUNITY)
Admission: RE | Admit: 2019-04-26 | Discharge: 2019-04-26 | Disposition: A | Discharge status: Home / Self Care | Payer: BC Managed Care – PPO | Source: Ambulatory Visit | Attending: Physician Assistant | Admitting: Physician Assistant

## 2019-04-26 ENCOUNTER — Encounter (HOSPITAL_COMMUNITY): Payer: Self-pay | Admitting: Physician Assistant

## 2019-04-26 VITALS — BP 136/64 | HR 72 | Ht 70.0 in | Wt 246.8 lb

## 2019-04-26 DIAGNOSIS — F17210 Nicotine dependence, cigarettes, uncomplicated: Secondary | ICD-10-CM | POA: Diagnosis not present

## 2019-04-26 DIAGNOSIS — E669 Obesity, unspecified: Secondary | ICD-10-CM | POA: Insufficient documentation

## 2019-04-26 DIAGNOSIS — Z8249 Family history of ischemic heart disease and other diseases of the circulatory system: Secondary | ICD-10-CM | POA: Insufficient documentation

## 2019-04-26 DIAGNOSIS — Z86010 Personal history of colonic polyps: Secondary | ICD-10-CM | POA: Insufficient documentation

## 2019-04-26 DIAGNOSIS — Z79899 Other long term (current) drug therapy: Secondary | ICD-10-CM | POA: Diagnosis not present

## 2019-04-26 DIAGNOSIS — Z6835 Body mass index (BMI) 35.0-35.9, adult: Secondary | ICD-10-CM | POA: Insufficient documentation

## 2019-04-26 DIAGNOSIS — G4733 Obstructive sleep apnea (adult) (pediatric): Secondary | ICD-10-CM | POA: Diagnosis not present

## 2019-04-26 DIAGNOSIS — Z801 Family history of malignant neoplasm of trachea, bronchus and lung: Secondary | ICD-10-CM | POA: Insufficient documentation

## 2019-04-26 DIAGNOSIS — Z7901 Long term (current) use of anticoagulants: Secondary | ICD-10-CM | POA: Insufficient documentation

## 2019-04-26 DIAGNOSIS — I10 Essential (primary) hypertension: Secondary | ICD-10-CM | POA: Insufficient documentation

## 2019-04-26 DIAGNOSIS — Z833 Family history of diabetes mellitus: Secondary | ICD-10-CM | POA: Diagnosis not present

## 2019-04-26 DIAGNOSIS — Z800 Family history of malignant neoplasm of digestive organs: Secondary | ICD-10-CM | POA: Insufficient documentation

## 2019-04-26 DIAGNOSIS — E785 Hyperlipidemia, unspecified: Secondary | ICD-10-CM | POA: Insufficient documentation

## 2019-04-26 DIAGNOSIS — Z888 Allergy status to other drugs, medicaments and biological substances status: Secondary | ICD-10-CM | POA: Insufficient documentation

## 2019-04-26 DIAGNOSIS — I4819 Other persistent atrial fibrillation: Secondary | ICD-10-CM | POA: Diagnosis not present

## 2019-04-26 DIAGNOSIS — I429 Cardiomyopathy, unspecified: Secondary | ICD-10-CM | POA: Insufficient documentation

## 2019-04-26 DIAGNOSIS — Z96651 Presence of right artificial knee joint: Secondary | ICD-10-CM | POA: Diagnosis not present

## 2019-04-26 LAB — TSH: TSH: 1.192 u[IU]/mL (ref 0.350–4.500)

## 2019-04-26 NOTE — Progress Notes (Signed)
Primary Care Physician: [name], DO Primary Cardiologist: Dr Lovena Le Primary Electrophysiologist: Dr Curt Bears Referring Physician: Dr Sheliah Mends Mitchell Parrish is a 64 y.o. male with a history of persistent atrial fibrillation, HTN, HLD, OSA, tachycardia mediated CM who presents for follow up in the [facility]. Patient is on Eliquis for a CHADS2VASC score of 1. He was maintained on amiodarone. He underwent afib ablation with Dr Curt Bears on 04/03/19. Patient reports that since his ablaiton, he has done very well. He has had no heart racing or palpitations. His smart watch has shown well controlled heart rates. He denies CP, swallowing, or groin issues.  Today, he denies symptoms of palpitations, chest pain, shortness of breath, orthopnea, PND, lower extremity edema, dizziness, presyncope, syncope, snoring, daytime somnolence, bleeding, or neurologic sequela. The patient is tolerating medications without difficulties and is otherwise without complaint today.    Atrial Fibrillation Risk Factors:  he does have symptoms or diagnosis of sleep apnea. he is compliant with CPAP therapy.   he has a BMI of Body mass index is 35.41 kg/m.Marland Kitchen Filed Weights   04/26/19 0858  Weight: 111.9 kg    Family History  Problem Relation Age of Onset  . Diabetes Mother   . Lung cancer Mother 41  . Diabetes Father   . Heart disease Father   . Colon cancer Father 30       rectal cancer     Atrial Fibrillation Management history:  Previous antiarrhythmic drugs: amiodarone Previous cardioversions: 06/25/2018 Previous ablations: 04/03/19 CHADS2VASC score: 1 Anticoagulation history: Eliquis   Past Medical History:  Diagnosis Date  . Acute diastolic CHF (congestive heart failure) ([place]) 05/29/2018  . Arthritis    left hip  . Atrial fibrillation ([place])   . Hyperlipidemia   . Hypertension   . Tubular adenoma of colon 11/2011   Past Surgical History:   Procedure Laterality Date  . ATRIAL FIBRILLATION ABLATION N/A 04/03/2019   Procedure: ATRIAL FIBRILLATION ABLATION;  Surgeon: Constance Haw, MD;  Location: [facility];  Service: Cardiovascular;  Laterality: N/A;  . BACK SURGERY    . CARDIOVERSION N/A 06/25/2018   Procedure: CARDIOVERSION (CATH LAB);  Surgeon: Evans Lance, MD;  Location: [facility];  Service: Cardiovascular;  Laterality: N/A;  . HERNIA REPAIR    . SPINE SURGERY     cervical fusion c6, c7 (1994), and c3,4,5,6 (2004)  . TOTAL KNEE ARTHROPLASTY Right 01/28/2019   Procedure: TOTAL KNEE ARTHROPLASTY;  Surgeon: Gaynelle Arabian, MD;  Location: [facility];  Service: Orthopedics;  Laterality: Right;  . WISDOM TOOTH EXTRACTION      Current Outpatient Medications  Medication Sig Dispense Refill  . albuterol (VENTOLIN HFA) 108 (90 Base) MCG/ACT inhaler Inhale 2 puffs into the lungs every 4 (four) hours as needed for wheezing or shortness of breath. 18 g 5  . amiodarone (PACERONE) 200 MG tablet Take 1 tablet (200 mg total) by mouth daily. 90 tablet 3  . ASPERCREME LIDOCAINE EX Apply 1 application topically at bedtime.    Marland Kitchen diltiazem (CARDIZEM CD) 120 MG 24 hr capsule Take 120 mg by mouth daily.    Marland Kitchen ELIQUIS 5 MG TABS tablet TAKE 1 TABLET BY MOUTH TWICE DAILY (Patient taking differently: Take 5 mg by mouth 2 (two) times daily. ) 60 tablet 6  . fluticasone (FLONASE) 50 MCG/ACT nasal spray Place 2 sprays into both nostrils as needed for allergies or rhinitis. 16 g 5  .  furosemide (LASIX) 40 MG tablet Take 1 tablet (40 mg total) by mouth 2 (two) times daily. 180 tablet 3  . metoprolol succinate (TOPROL XL) 25 MG 24 hr tablet Take 1 tablet (25 mg total) by mouth at bedtime. 90 tablet 3  . SYMBICORT 80-4.5 MCG/ACT inhaler Inhale 2 puffs into the lungs 2 (two) times daily. 10.2 g 5  . umeclidinium bromide (INCRUSE ELLIPTA) 62.5 MCG/INH AEPB Inhale 1 puff into the lungs daily. 30 each 5  . OVER THE COUNTER MEDICATION Take  0.05 drops by mouth daily at 2 PM. CBD oil 240 mg on the bottle      No current facility-administered medications for this encounter.    Allergies  Allergen Reactions  . Lodine [Etodolac] Hives    Social History   Socioeconomic History  . Marital status: Married    Spouse name: Not on file  . Number of children: 4  . Years of education: Not on file  . Highest education level: Not on file  Occupational History  . Occupation: Nurse, mental health: [employer]  Tobacco Use  . Smoking status: Current Some Day Smoker    Packs/day: 1.00    Years: 45.00    Pack years: 45.00    Types: Cigarettes  . Smokeless tobacco: Never Used  Substance and Sexual Activity  . Alcohol use: Yes    Alcohol/week: 1.0 - 2.0 standard drinks    Types: 1 - 2 Cans of beer per week    Comment: 2-3 drinks/ day  . Drug use: No  . Sexual activity: Yes  Other Topics Concern  . Not on file  Social History Narrative  . Not on file   Social Determinants of Health   Financial Resource Strain:   . Difficulty of Paying Living Expenses:   Food Insecurity:   . Worried About Charity fundraiser in the Last Year:   . Arboriculturist in the Last Year:   Transportation Needs:   . Film/video editor (Medical):   Marland Kitchen Lack of Transportation (Non-Medical):   Physical Activity:   . Days of Exercise per Week:   . Minutes of Exercise per Session:   Stress:   . Feeling of Stress :   Social Connections:   . Frequency of Communication with Friends and Family:   . Frequency of Social Gatherings with Friends and Family:   . Attends Religious Services:   . Active Member of Clubs or Organizations:   . Attends Archivist Meetings:   Marland Kitchen Marital Status:   Intimate Partner Violence:   . Fear of Current or Ex-Partner:   . Emotionally Abused:   Marland Kitchen Physically Abused:   . Sexually Abused:      ROS- All systems are reviewed and negative except as per the HPI above.  Physical Exam: Vitals:   04/26/19  0858  BP: 136/64  Pulse: 72  Weight: 111.9 kg  Height: 5\' 10"  (1.778 m)    GEN- The patient is well appearing obese male, alert and oriented x 3 today.   Head- normocephalic, atraumatic Eyes-  Sclera clear, conjunctiva pink Ears- hearing intact Oropharynx- clear Neck- supple  Lungs- Clear to ausculation bilaterally, normal work of breathing Heart- Regular rate and rhythm, no murmurs, rubs or gallops  GI- soft, NT, ND, + BS Extremities- no clubbing, cyanosis, or edema MS- no significant deformity or atrophy Skin- no rash or lesion Psych- euthymic mood, full affect Neuro- strength and sensation are intact  Wt Readings from Last 3 Encounters:  04/26/19 111.9 kg  04/03/19 108 kg  01/28/19 115.2 kg    EKG today demonstrates SR HR 72, PR 132, QRS 90, QTc 446  Echo 01/01/19 demonstrated  1. Left ventricular ejection fraction, by visual estimation, is 60 to  65%. The left ventricle has normal function. There is no left ventricular  hypertrophy.  2. The left ventricle has no regional wall motion abnormalities.  3. Left ventricular diastolic parameters are consistent with Grade II  diastolic dysfunction (pseudonormalization).  4. Global right ventricle has normal systolic function.The right  ventricular size is normal. No increase in right ventricular wall  thickness.  5. Left atrial size was normal.  6. Right atrial size was normal.  7. The mitral valve is normal in structure. Trace mitral valve  regurgitation. No evidence of mitral stenosis.  8. The tricuspid valve is normal in structure. Tricuspid valve  regurgitation is trivial.  9. The aortic valve is tricuspid. Aortic valve regurgitation is not  visualized. No evidence of aortic valve sclerosis or stenosis.  10. There is mild dilatation of the aortic root measuring 38 mm.  11. The inferior vena cava is normal in size with greater than 50%  respiratory variability, suggesting right atrial pressure of 3 mmHg.  12.  The tricuspid regurgitant velocity is 2.51 m/s, and with an assumed  right atrial pressure of 3 mmHg, the estimated right ventricular systolic  pressure is normal at 28.2 mmHg.   Epic records are reviewed at length today  CHA2DS2-VASc Score = 1 The patient's score is based upon: CHF History: No HTN History: Yes Age : < 65 Diabetes History: No Stroke History: No Vascular Disease History: No Gender: Male      ASSESSMENT AND PLAN: 1. Persistent Atrial Fibrillation (ICD10:  I48.19) The patient's CHA2DS2-VASc score is 1, indicating a 0.6% annual risk of stroke.  S/p afib ablation 04/03/19 with Dr Curt Bears. Patient appears to be maintaining SR. Continue amiodarone 200 mg daily for now. Anticipate this will be short term. Continue diltiazem 120 mg daily. Continue Toprol 25 mg daily Continue Eliquis 5 mg BID with no missed doses for at least 3 months post ablation.   2. Obesity Body mass index is 35.41 kg/m. Lifestyle modification was discussed at length including regular exercise and weight reduction.  3. Obstructive sleep apnea The importance of adequate treatment of sleep apnea was discussed today in order to improve our ability to maintain sinus rhythm long term. Patient reports compliance with CPAP therapy.  4. HTN Stable, no changes today.  5. Cardiomyopathy Suspected tachycardia mediated. Normalized on last echo.   Follow up with Dr Curt Bears as scheduled.    [facility] [address] [place], [place] [address] [phone] 04/26/2019 9:16 AM

## 2019-04-30 ENCOUNTER — Telehealth: Payer: Self-pay

## 2019-04-30 NOTE — Telephone Encounter (Signed)
Received /// completed/// PCP signed and faxed then. Called the patient to confirm all done

## 2019-04-30 NOTE — Telephone Encounter (Signed)
Patient called in to see if Dr. Nani Ravens have sent in his information to his health insurance stating that he had a physical. Please follow up with the patient at [phone]

## 2019-04-30 NOTE — Telephone Encounter (Signed)
Called the patient and he had just recently faxed over a form...informed him we do not have it. He is going to fax to both numbers.

## 2019-04-30 NOTE — Telephone Encounter (Signed)
That was back in Nov, I don't remember, happy to send something if we didn't. Do we have any forms/did we do this?

## 2019-05-01 ENCOUNTER — Ambulatory Visit: Payer: BC Managed Care – PPO

## 2019-07-09 ENCOUNTER — Ambulatory Visit: Payer: BC Managed Care – PPO | Admitting: Cardiology

## 2019-07-09 ENCOUNTER — Encounter: Payer: Self-pay | Admitting: Cardiology

## 2019-07-09 ENCOUNTER — Other Ambulatory Visit: Payer: Self-pay

## 2019-07-09 VITALS — BP 144/78 | HR 66 | Ht 70.0 in | Wt 253.0 lb

## 2019-07-09 DIAGNOSIS — I4819 Other persistent atrial fibrillation: Secondary | ICD-10-CM

## 2019-07-09 MED ORDER — LOSARTAN POTASSIUM 50 MG PO TABS: 50.0000 mg | ORAL_TABLET | Freq: Every day | ORAL | 1 refills | Status: DC

## 2019-07-09 MED ORDER — FUROSEMIDE 40 MG PO TABS: 40.0000 mg | ORAL_TABLET | Freq: Every day | ORAL | 1 refills | Status: DC | PRN

## 2019-07-09 NOTE — Progress Notes (Signed)
Electrophysiology Office Note   Date:  07/09/2019   ID:  Mitchell Parrish, DOB Mar 30, 1955, MRN [id number]  PCP:  [name], DO  Cardiologist:  Lovena Le Primary Electrophysiologist: Gaye Alken, MD    Chief Complaint: AF   History of Present Illness: Mitchell Parrish is a 64 y.o. male who is being seen today for the evaluation of AF at the request of [name]*. Presenting today for electrophysiology evaluation.  He has a history of persistent atrial fibrillation, diastolic heart failure, hypertension, hyperlipidemia.  He has been on amiodarone.  He developed a tachycardia induced cardiomyopathy, but fortunately his ejection fraction has improved.  He has now status post AF ablation 04/26/2019.  Today, denies symptoms of palpitations, chest pain, shortness of breath, orthopnea, PND, lower extremity edema, claudication, dizziness, presyncope, syncope, bleeding, or neurologic sequela. The patient is tolerating medications without difficulties.  He currently feels well.  He has no chest pain or shortness of breath.  He is able to do all of his daily activities.  He is noted no further episodes of atrial fibrillation since his ablation.  He does have some mild fatigue, but otherwise has been feeling well.   Past Medical History:  Diagnosis Date   Acute diastolic CHF (congestive heart failure) ([place]) 05/29/2018   Arthritis    left hip   Atrial fibrillation ([place])    Hyperlipidemia    Hypertension    Tubular adenoma of colon 11/2011   Past Surgical History:  Procedure Laterality Date   ATRIAL FIBRILLATION ABLATION N/A 04/03/2019   Procedure: ATRIAL FIBRILLATION ABLATION;  Surgeon: Constance Haw, MD;  Location: [facility];  Service: Cardiovascular;  Laterality: N/A;   BACK SURGERY     CARDIOVERSION N/A 06/25/2018   Procedure: CARDIOVERSION (CATH LAB);  Surgeon: Evans Lance, MD;  Location: [facility];  Service:  Cardiovascular;  Laterality: N/A;   HERNIA REPAIR     SPINE SURGERY     cervical fusion c6, c7 (1994), and c3,4,5,6 (2004)   TOTAL KNEE ARTHROPLASTY Right 01/28/2019   Procedure: TOTAL KNEE ARTHROPLASTY;  Surgeon: Gaynelle Arabian, MD;  Location: [facility];  Service: Orthopedics;  Laterality: Right;   WISDOM TOOTH EXTRACTION       Current Outpatient Medications  Medication Sig Dispense Refill   albuterol (VENTOLIN HFA) 108 (90 Base) MCG/ACT inhaler Inhale 2 puffs into the lungs every 4 (four) hours as needed for wheezing or shortness of breath. 18 g 5   ASPERCREME LIDOCAINE EX Apply 1 application topically at bedtime.     fluticasone (FLONASE) 50 MCG/ACT nasal spray Place 2 sprays into both nostrils as needed for allergies or rhinitis. 16 g 5   metoprolol succinate (TOPROL XL) 25 MG 24 hr tablet Take 1 tablet (25 mg total) by mouth at bedtime. 90 tablet 3   OVER THE COUNTER MEDICATION Take 0.05 drops by mouth daily at 2 PM. CBD oil 240 mg on the bottle      SYMBICORT 80-4.5 MCG/ACT inhaler Inhale 2 puffs into the lungs 2 (two) times daily. 10.2 g 5   umeclidinium bromide (INCRUSE ELLIPTA) 62.5 MCG/INH AEPB Inhale 1 puff into the lungs daily. 30 each 5   furosemide (LASIX) 40 MG tablet Take 1 tablet (40 mg total) by mouth daily as needed. 30 tablet 1   losartan (COZAAR) 50 MG tablet Take 1 tablet (50 mg total) by mouth daily. 90 tablet 1   No current facility-administered medications for this  visit.    Allergies:   Lodine [etodolac]   Social History:  The patient  reports that he has been smoking cigarettes. He has a 45.00 pack-year smoking history. He has never used smokeless tobacco. He reports current alcohol use of about 1.0 - 2.0 standard drinks of alcohol per week. He reports that he does not use drugs.   Family History:  The patient's family history includes Colon cancer (age of onset: 50) in his father; Diabetes in his father and mother; Heart disease in his father; Lung  cancer (age of onset: 68) in his mother.    ROS:  Please see the history of present illness.   Otherwise, review of systems is positive for none.   All other systems are reviewed and negative.   PHYSICAL EXAM: VS:  BP (!) 144/78    Pulse 66    Ht 5\' 10"  (1.778 m)    Wt 253 lb (114.8 kg)    BMI 36.30 kg/m  , BMI Body mass index is 36.3 kg/m. GEN: Well nourished, well developed, in no acute distress  HEENT: normal  Neck: no JVD, carotid bruits, or masses Cardiac: RRR; no murmurs, rubs, or gallops,no edema  Respiratory:  clear to auscultation bilaterally, normal work of breathing GI: soft, nontender, nondistended, + BS MS: no deformity or atrophy  Skin: warm and dry Neuro:  Strength and sensation are intact Psych: euthymic mood, full affect  EKG:  EKG is ordered today. Personal review of the ekg ordered shows sinus rhythm, rate 66  Recent Labs: 01/25/2019: ALT 22 03/18/2019: BUN 10; Creatinine, Ser 1.06; Hemoglobin 14.9; Platelets 256; Potassium 4.2; Sodium 140 04/26/2019: TSH 1.192    Lipid Panel     Component Value Date/Time   CHOL 240 (H) 12/18/2018 0737   TRIG 109.0 12/18/2018 0737   HDL 48.10 12/18/2018 0737   CHOLHDL 5 12/18/2018 0737   VLDL 21.8 12/18/2018 0737   LDLCALC 170 (H) 12/18/2018 0737     Wt Readings from Last 3 Encounters:  07/09/19 253 lb (114.8 kg)  04/26/19 246 lb 12.8 oz (111.9 kg)  04/03/19 238 lb (108 kg)      Other studies Reviewed: Additional studies/ records that were reviewed today include: TTE 01/01/19  Review of the above records today demonstrates:   1. Left ventricular ejection fraction, by visual estimation, is 60 to 65%. The left ventricle has normal function. There is no left ventricular hypertrophy.  2. The left ventricle has no regional wall motion abnormalities.  3. Left ventricular diastolic parameters are consistent with Grade II diastolic dysfunction (pseudonormalization).  4. Global right ventricle has normal systolic  function.The right ventricular size is normal. No increase in right ventricular wall thickness.  5. Left atrial size was normal.  6. Right atrial size was normal.  7. The mitral valve is normal in structure. Trace mitral valve regurgitation. No evidence of mitral stenosis.  8. The tricuspid valve is normal in structure. Tricuspid valve regurgitation is trivial.  9. The aortic valve is tricuspid. Aortic valve regurgitation is not visualized. No evidence of aortic valve sclerosis or stenosis. 10. There is mild dilatation of the aortic root measuring 38 mm. 11. The inferior vena cava is normal in size with greater than 50% respiratory variability, suggesting right atrial pressure of 3 mmHg. 12. The tricuspid regurgitant velocity is 2.51 m/s, and with an assumed right atrial pressure of 3 mmHg, the estimated right ventricular systolic pressure is normal at 28.2 mmHg.   ASSESSMENT AND PLAN:  1.  Persistent atrial fibrillation: CHA2DS2-VASc of 1.  Status post AF ablation 04/03/2019.  Currently on amiodarone, diltiazem, Toprol, Eliquis.  Is fortunately remained in sinus rhythm.  Due to that, we Olney Monier stop amiodarone, diltiazem, Eliquis.   2.  Hypertension: Blood pressure is elevated.  Stopping diltiazem and starting losartan 50 mg today.  3.  Systolic heart failure: Likely due to a tachycardia mediated cardiomyopathy.  Ejection fraction is since improved.  4.  Obstructive sleep apnea: CPAP compliance encouraged.  Current medicines are reviewed at length with the patient today.   The patient does not have concerns regarding his medicines.  The following changes were made today:  none  Labs/ tests ordered today include:  Orders Placed This Encounter  Procedures   EKG 12-Lead     Disposition:   FU with Deshante Cassell 3 months  Signed, Tarrence Enck Meredith Leeds, MD  07/09/2019 10:55 AM     [address] [place] [address] [phone]  (office) [phone] (fax)

## 2019-07-09 NOTE — Patient Instructions (Signed)
Medication Instructions:  Your physician has recommended you make the following change in your medication:  1. STOP Eliquis 2. STOP Diltiazem 3. STOP Amiodarone 4. CHANGE Lasix to: take once daily as needed 5. START Losartan 50 mg once a day  *If you need a refill on your cardiac medications before your next appointment, please call your pharmacy*   Lab Work: None ordered  Testing/Procedures: None ordered  Follow-Up: At [facility], you and your health needs are our priority.  As part of our continuing mission to provide you with exceptional heart care, we have created designated Provider Care Teams.  These Care Teams include your primary Cardiologist (physician) and Advanced Practice Providers (APPs -  Physician Assistants and Nurse Practitioners) who all work together to provide you with the care you need, when you need it.  We recommend signing up for the patient portal called "MyChart".  Sign up information is provided on this After Visit Summary.  MyChart is used to connect with patients for Virtual Visits (Telemedicine).  Patients are able to view lab/test results, encounter notes, upcoming appointments, etc.  Non-urgent messages can be sent to your provider as well.   To learn more about what you can do with MyChart, go to [email or web address].    Your next appointment:   3 month(s)  The format for your next appointment:   In Person  Provider:   Allegra Lai, MD   Thank you for choosing [facility]!!   Trinidad Curet, RN [phone]    Other Instructions

## 2019-09-05 ENCOUNTER — Other Ambulatory Visit: Payer: Self-pay | Admitting: Family Medicine

## 2019-09-05 DIAGNOSIS — J42 Unspecified chronic bronchitis: Secondary | ICD-10-CM

## 2019-10-02 ENCOUNTER — Other Ambulatory Visit: Payer: Self-pay | Admitting: Internal Medicine

## 2019-10-02 ENCOUNTER — Other Ambulatory Visit: Payer: Self-pay | Admitting: Family Medicine

## 2019-10-02 DIAGNOSIS — J42 Unspecified chronic bronchitis: Secondary | ICD-10-CM

## 2019-10-22 ENCOUNTER — Ambulatory Visit: Payer: BC Managed Care – PPO | Admitting: Cardiology

## 2019-10-22 ENCOUNTER — Encounter: Payer: Self-pay | Admitting: Cardiology

## 2019-10-22 ENCOUNTER — Other Ambulatory Visit: Payer: Self-pay

## 2019-10-22 VITALS — BP 136/74 | HR 90 | Ht 70.0 in | Wt 255.0 lb

## 2019-10-22 DIAGNOSIS — I4819 Other persistent atrial fibrillation: Secondary | ICD-10-CM | POA: Diagnosis not present

## 2019-10-22 NOTE — Progress Notes (Signed)
Electrophysiology Office Note   Date:  10/22/2019   ID:  Mitchell Parrish, DOB 1955/05/01, MRN [id number]  PCP:  [name], DO  Cardiologist:  Lovena Le Primary Electrophysiologist: Gaye Alken, MD    Chief Complaint: AF   History of Present Illness: Mitchell Parrish is a 64 y.o. male who is being seen today for the evaluation of AF at the request of [name]*. Presenting today for electrophysiology evaluation.  He has a history of persistent atrial fibrillation, diastolic heart failure, hypertension, hyperlipidemia.  He has been on amiodarone.  He developed a tachycardia induced cardiomyopathy, but fortunately his ejection fraction has improved.  He has now status post AF ablation 04/26/2019.  Today, denies symptoms of palpitations, chest pain, shortness of breath, orthopnea, PND, lower extremity edema, claudication, dizziness, presyncope, syncope, bleeding, or neurologic sequela. The patient is tolerating medications without difficulties.  Overall he is doing well.  He has no chest pain or shortness of breath.  He has been trying to lose weight with diet and exercise.  He does note intermittent palpitations, but he is unsure if this is due to atrial fibrillation.   Past Medical History:  Diagnosis Date  . Acute diastolic CHF (congestive heart failure) ([place]) 05/29/2018  . Arthritis    left hip  . Atrial fibrillation ([place])   . Hyperlipidemia   . Hypertension   . Tubular adenoma of colon 11/2011   Past Surgical History:  Procedure Laterality Date  . ATRIAL FIBRILLATION ABLATION N/A 04/03/2019   Procedure: ATRIAL FIBRILLATION ABLATION;  Surgeon: Constance Haw, MD;  Location: [facility];  Service: Cardiovascular;  Laterality: N/A;  . BACK SURGERY    . CARDIOVERSION N/A 06/25/2018   Procedure: CARDIOVERSION (CATH LAB);  Surgeon: Evans Lance, MD;  Location: [facility];  Service: Cardiovascular;  Laterality: N/A;  .  HERNIA REPAIR    . SPINE SURGERY     cervical fusion c6, c7 (1994), and c3,4,5,6 (2004)  . TOTAL KNEE ARTHROPLASTY Right 01/28/2019   Procedure: TOTAL KNEE ARTHROPLASTY;  Surgeon: Gaynelle Arabian, MD;  Location: [facility];  Service: Orthopedics;  Laterality: Right;  . WISDOM TOOTH EXTRACTION       Current Outpatient Medications  Medication Sig Dispense Refill  . albuterol (VENTOLIN HFA) 108 (90 Base) MCG/ACT inhaler Inhale 2 puffs into the lungs every 4 (four) hours as needed for wheezing or shortness of breath. 18 g 5  . ASPERCREME LIDOCAINE EX Apply 1 application topically at bedtime.    . fluticasone (FLONASE) 50 MCG/ACT nasal spray Place 2 sprays into both nostrils as needed for allergies or rhinitis. 16 g 5  . furosemide (LASIX) 40 MG tablet Take 1 tablet (40 mg total) by mouth daily as needed. 30 tablet 1  . INCRUSE ELLIPTA 62.5 MCG/INH AEPB INHALE 1 PUFF INTO THE LUNGS DAILY 30 each 5  . losartan (COZAAR) 50 MG tablet Take 1 tablet (50 mg total) by mouth daily. 90 tablet 1  . metoprolol succinate (TOPROL-XL) 25 MG 24 hr tablet TAKE 1 TABLET BY MOUTH EACH NIGHT AT BEDTIME 90 tablet 3  . OVER THE COUNTER MEDICATION Take 0.05 drops by mouth daily at 2 PM. CBD oil 240 mg on the bottle     . SYMBICORT 80-4.5 MCG/ACT inhaler INHALE 2 PUFFS INTO THE LUNGS TWICE DAILY 10.2 g 5   No current facility-administered medications for this visit.    Allergies:   Lodine [etodolac]   Social History:  The patient  reports that he has been smoking cigarettes. He has a 45.00 pack-year smoking history. He has never used smokeless tobacco. He reports current alcohol use of about 1.0 - 2.0 standard drink of alcohol per week. He reports that he does not use drugs.   Family History:  The patient's family history includes Colon cancer (age of onset: 30) in his father; Diabetes in his father and mother; Heart disease in his father; Lung cancer (age of onset: 81) in his mother.    ROS:  Please see the history  of present illness.   Otherwise, review of systems is positive for none.   All other systems are reviewed and negative.   PHYSICAL EXAM: VS:  BP 136/74   Pulse 90   Ht 5\' 10"  (1.778 m)   Wt 255 lb (115.7 kg)   SpO2 93%   BMI 36.59 kg/m  , BMI Body mass index is 36.59 kg/m. GEN: Well nourished, well developed, in no acute distress  HEENT: normal  Neck: no JVD, carotid bruits, or masses Cardiac: RRR; no murmurs, rubs, or gallops,no edema  Respiratory:  clear to auscultation bilaterally, normal work of breathing GI: soft, nontender, nondistended, + BS MS: no deformity or atrophy  Skin: warm and dry Neuro:  Strength and sensation are intact Psych: euthymic mood, full affect  EKG:  EKG is ordered today. Personal review of the ekg ordered shows sinus rhythm, rate 90   Recent Labs: 01/25/2019: ALT 22 03/18/2019: BUN 10; Creatinine, Ser 1.06; Hemoglobin 14.9; Platelets 256; Potassium 4.2; Sodium 140 04/26/2019: TSH 1.192    Lipid Panel     Component Value Date/Time   CHOL 240 (H) 12/18/2018 0737   TRIG 109.0 12/18/2018 0737   HDL 48.10 12/18/2018 0737   CHOLHDL 5 12/18/2018 0737   VLDL 21.8 12/18/2018 0737   LDLCALC 170 (H) 12/18/2018 0737     Wt Readings from Last 3 Encounters:  10/22/19 255 lb (115.7 kg)  07/09/19 253 lb (114.8 kg)  04/26/19 246 lb 12.8 oz (111.9 kg)      Other studies Reviewed: Additional studies/ records that were reviewed today include: TTE 01/01/19  Review of the above records today demonstrates:   1. Left ventricular ejection fraction, by visual estimation, is 60 to 65%. The left ventricle has normal function. There is no left ventricular hypertrophy.  2. The left ventricle has no regional wall motion abnormalities.  3. Left ventricular diastolic parameters are consistent with Grade II diastolic dysfunction (pseudonormalization).  4. Global right ventricle has normal systolic function.The right ventricular size is normal. No increase in right  ventricular wall thickness.  5. Left atrial size was normal.  6. Right atrial size was normal.  7. The mitral valve is normal in structure. Trace mitral valve regurgitation. No evidence of mitral stenosis.  8. The tricuspid valve is normal in structure. Tricuspid valve regurgitation is trivial.  9. The aortic valve is tricuspid. Aortic valve regurgitation is not visualized. No evidence of aortic valve sclerosis or stenosis. 10. There is mild dilatation of the aortic root measuring 38 mm. 11. The inferior vena cava is normal in size with greater than 50% respiratory variability, suggesting right atrial pressure of 3 mmHg. 12. The tricuspid regurgitant velocity is 2.51 m/s, and with an assumed right atrial pressure of 3 mmHg, the estimated right ventricular systolic pressure is normal at 28.2 mmHg.   ASSESSMENT AND PLAN:  1. Persistent atrial fibrillation: CHA2DS2-VASc of 1. Status post AF ablation 04/03/2019. Currently on  the metoprolol.  He fortunately remains in sinus rhythm.  He is having some palpitations.  I have told him about the alive core monitor.  2. Hypertension: Mildly elevated today but usually better controlled.  No changes.  3. Chronic systolic heart failure: Possibly to a tachycardia mediated cardiomyopathy. Post ablation, ejection fraction has improved.  4. Obstructive sleep apnea: CPAP compliance encouraged  Current medicines are reviewed at length with the patient today.   The patient does not have concerns regarding his medicines.  The following changes were made today: None  Labs/ tests ordered today include:  Orders Placed This Encounter  Procedures  . EKG 12-Lead     Disposition:   FU with Mitchell Parrish 6 months  Signed, Mitchell Inclan Meredith Leeds, MD  10/22/2019 2:31 PM     [facility] [address] [place]  [address] [phone] (office) [phone] (fax)

## 2019-11-23 ENCOUNTER — Ambulatory Visit: Payer: BC Managed Care – PPO | Attending: Internal Medicine

## 2019-11-23 ENCOUNTER — Other Ambulatory Visit: Payer: Self-pay

## 2019-11-23 DIAGNOSIS — Z23 Encounter for immunization: Secondary | ICD-10-CM

## 2019-11-23 NOTE — Progress Notes (Signed)
   Covid-19 Vaccination Clinic  Name:  GAVYNN DUVALL    MRN: [id number] DOB: Mar 05, 1955  11/23/2019  Mr. Paskett was observed post Covid-19 immunization for 15 minutes without incident. He was provided with Vaccine Information Sheet and instruction to access the V-Safe system.   Mr. Seib was instructed to call 911 with any severe reactions post vaccine: Marland Kitchen Difficulty breathing  . Swelling of face and throat  . A fast heartbeat  . A bad rash all over body  . Dizziness and weakness

## 2019-12-10 ENCOUNTER — Other Ambulatory Visit: Payer: Self-pay | Admitting: Family Medicine

## 2019-12-10 DIAGNOSIS — J42 Unspecified chronic bronchitis: Secondary | ICD-10-CM

## 2019-12-11 ENCOUNTER — Telehealth: Payer: Self-pay

## 2019-12-11 NOTE — Telephone Encounter (Signed)
PA cancelled. Last filled 11/27/2019. No further action needed.

## 2019-12-11 NOTE — Telephone Encounter (Signed)
PA initiated via Covermymeds; KEY: [id number]. Awaiting determination.

## 2019-12-20 ENCOUNTER — Encounter: Payer: BC Managed Care – PPO | Admitting: Family Medicine

## 2019-12-25 ENCOUNTER — Ambulatory Visit (INDEPENDENT_AMBULATORY_CARE_PROVIDER_SITE_OTHER): Payer: BC Managed Care – PPO | Admitting: Family Medicine

## 2019-12-25 ENCOUNTER — Encounter: Payer: Self-pay | Admitting: Family Medicine

## 2019-12-25 ENCOUNTER — Other Ambulatory Visit: Payer: Self-pay

## 2019-12-25 VITALS — BP 132/76 | HR 58 | Temp 98.0°F | Ht 70.0 in | Wt 253.5 lb

## 2019-12-25 DIAGNOSIS — D489 Neoplasm of uncertain behavior, unspecified: Secondary | ICD-10-CM

## 2019-12-25 DIAGNOSIS — Z0000 Encounter for general adult medical examination without abnormal findings: Secondary | ICD-10-CM

## 2019-12-25 DIAGNOSIS — D692 Other nonthrombocytopenic purpura: Secondary | ICD-10-CM | POA: Insufficient documentation

## 2019-12-25 DIAGNOSIS — F17210 Nicotine dependence, cigarettes, uncomplicated: Secondary | ICD-10-CM

## 2019-12-25 DIAGNOSIS — E782 Mixed hyperlipidemia: Secondary | ICD-10-CM | POA: Diagnosis not present

## 2019-12-25 DIAGNOSIS — Z23 Encounter for immunization: Secondary | ICD-10-CM

## 2019-12-25 DIAGNOSIS — J42 Unspecified chronic bronchitis: Secondary | ICD-10-CM | POA: Diagnosis not present

## 2019-12-25 DIAGNOSIS — Z125 Encounter for screening for malignant neoplasm of prostate: Secondary | ICD-10-CM | POA: Diagnosis not present

## 2019-12-25 MED ORDER — BUDESONIDE-FORMOTEROL FUMARATE 160-4.5 MCG/ACT IN AERO: 1.0000 | INHALATION_SPRAY | Freq: Two times a day (BID) | RESPIRATORY_TRACT | 12 refills | Status: DC

## 2019-12-25 MED ORDER — VARENICLINE TARTRATE 0.5 MG X 11 & 1 MG X 42 PO MISC: ORAL | 0 refills | Status: DC

## 2019-12-25 NOTE — Patient Instructions (Addendum)
If you do not hear anything about your referral in the next 1-2 weeks, call our office and ask for an update.  Give [place] 2-3 business days to get the results of your labs back.   Keep the diet clean and stay active.  If the skin lesion is still there in 1 month, send me a message please.  Let us know if you need anything.

## 2019-12-25 NOTE — Progress Notes (Signed)
Chief Complaint  Patient presents with  . Annual Exam    Well Male Mitchell Parrish is here for a complete physical.   His last physical was >1 year ago.  Current diet: in general, a "healthy" diet.  Current exercise: none Weight trend: stable Fatigue out of ordinary? No. Seat belt? Yes.     Health maintenance Shingrix- Yes Colonoscopy- Yes Tetanus- Yes HIV- Yes Hep C- Yes Lung cancer screening- No   Skin lesion on forearm that will scab and return. No pain or drainage. No new topicals. It does not hurt or itch. Has not tried anything at home.  He has smoked for nearly 45 years. He was smoking around 1 ppd but is currently at 1/2 ppd. He did well with Chantix in the past. His spouse smokes and he would like to quit w her. He would be due foe a CT chest in the spring.    Past Medical History:  Diagnosis Date  . Acute diastolic CHF (congestive heart failure) ([place]) 05/29/2018  . Arthritis    left hip  . Atrial fibrillation ([place])   . Hyperlipidemia   . Hypertension   . Tubular adenoma of colon 11/2011      Past Surgical History:  Procedure Laterality Date  . ATRIAL FIBRILLATION ABLATION N/A 04/03/2019   Procedure: ATRIAL FIBRILLATION ABLATION;  Surgeon: Constance Haw, MD;  Location: [facility];  Service: Cardiovascular;  Laterality: N/A;  . BACK SURGERY    . CARDIOVERSION N/A 06/25/2018   Procedure: CARDIOVERSION (CATH LAB);  Surgeon: Evans Lance, MD;  Location: [facility];  Service: Cardiovascular;  Laterality: N/A;  . HERNIA REPAIR    . SPINE SURGERY     cervical fusion c6, c7 (1994), and c3,4,5,6 (2004)  . TOTAL KNEE ARTHROPLASTY Right 01/28/2019   Procedure: TOTAL KNEE ARTHROPLASTY;  Surgeon: Gaynelle Arabian, MD;  Location: [facility];  Service: Orthopedics;  Laterality: Right;  . WISDOM TOOTH EXTRACTION      Medications  Current Outpatient Medications on File Prior to Visit  Medication Sig Dispense Refill  . albuterol (VENTOLIN HFA) 108  (90 Base) MCG/ACT inhaler INHALE 2 PUFFS INTO THE LUNGS EVERY 4 HOURS AS NEEDED FOR WHEEZING OR SHORTNESS OF BREATH 8.5 g 1  . fluticasone (FLONASE) 50 MCG/ACT nasal spray Place 2 sprays into both nostrils as needed for allergies or rhinitis. 16 g 5  . furosemide (LASIX) 40 MG tablet Take 1 tablet (40 mg total) by mouth daily as needed. 30 tablet 1  . INCRUSE ELLIPTA 62.5 MCG/INH AEPB INHALE 1 PUFF INTO THE LUNGS DAILY 30 each 5  . losartan (COZAAR) 50 MG tablet Take 1 tablet (50 mg total) by mouth daily. 90 tablet 1  . metoprolol succinate (TOPROL-XL) 25 MG 24 hr tablet TAKE 1 TABLET BY MOUTH EACH NIGHT AT BEDTIME 90 tablet 3    Allergies Allergies  Allergen Reactions  . Lodine [Etodolac] Hives    Family History Family History  Problem Relation Age of Onset  . Diabetes Mother   . Lung cancer Mother 60  . Diabetes Father   . Heart disease Father   . Colon cancer Father 49       rectal cancer    Review of Systems: Constitutional:  no fevers Eye:  no recent significant change in vision Ear/Nose/Mouth/Throat:  Ears:  no hearing loss Nose/Mouth/Throat:  no complaints of nasal congestion, no sore throat Cardiovascular:  no chest pain Respiratory:  no shortness of breath Gastrointestinal:  no change in bowel habits GU:  Male: negative for dysuria, frequency Musculoskeletal/Extremities:  no joint pain Integumentary (Skin/Breast): +lesion on forearm Neurologic:  no headaches Endocrine: No unexpected weight changes Hematologic/Lymphatic:  no abnormal bleeding  Exam BP 132/76 (BP Location: Left Arm, Patient Position: Sitting, Cuff Size: Normal)   Pulse (!) 58   Temp 98 F (36.7 C) (Oral)   Ht 5\' 10"  (1.778 m)   Wt 253 lb 8 oz (115 kg)   SpO2 94%   BMI 36.37 kg/m  General:  well developed, well nourished, in no apparent distress Skin: Slightly raised and scaly lesion on the L dorsal forearm. Otherwise no significant moles, warts, or growths Head:  no masses, lesions, or  tenderness Eyes:  pupils equal and round, sclera anicteric without injection Ears:  canals without lesions, TMs shiny without retraction, no obvious effusion, no erythema Nose:  nares patent, septum midline, mucosa normal Throat/Pharynx:  lips and gingiva without lesion; tongue and uvula midline; non-inflamed pharynx; no exudates or postnasal drainage Neck: neck supple without adenopathy, thyromegaly, or masses Cardiac: RRR, no bruits, 1+ pitting b/l LE edema tapering at distal 1/3 of tibia Lungs:  clear to auscultation, breath sounds equal bilaterally, no respiratory distress Rectal: Deferred Musculoskeletal:  symmetrical muscle groups noted without atrophy or deformity Neuro:  gait normal; deep tendon reflexes normal and symmetric Psych: well oriented with normal range of affect and appropriate judgment/insight  Procedure note: cryotherapy Verbal consent obtained 1 skin lesion treated Liquid nitrogen was applied via a thin spray creating an ice ball with 1-2 mm corona surrounding the lesion The patient tolerated the procedure well There were no immediate complications noted  Assessment and Plan  Well adult exam  Screening for prostate cancer - Plan: PSA  Mixed hyperlipidemia - Plan: Lipid panel, Comprehensive metabolic panel  Chronic bronchitis, unspecified chronic bronchitis type ([place]) - Plan: budesonide-formoterol (SYMBICORT) 160-4.5 MCG/ACT inhaler  Morbid obesity ([place]) - Plan: Amb Ref to Medical Weight Management  Smokes with greater than 30 pack year history - Plan: CT CHEST LUNG CANCER SCREENING LOW DOSE WO CONTRAST  Senile purpura ([place])  Neoplasm of uncertain behavior - Plan: PR DESTRUC PREMAL,FIRST LESION  Need for influenza vaccination - Plan: Flu Vaccine QUAD 6+ mos PF IM (Fluarix Quad PF)   Well 64 y.o. male. Counseled on diet and exercise. Counseled on risks and benefits of prostate cancer screening with PSA. The patient agrees to undergo  testing. Immunizations, labs, and further orders as above. Refer to MWM team regarding his weight. Cryo today. If he still has lesion in 1 mo, he will message [place] and we will sched removal.  Start Chantix again.  If things go well, I will see him in 6 months.  The patient voiced understanding and agreement to the plan.  [place], DO 12/25/19 3:21 PM

## 2019-12-26 LAB — LIPID PANEL
Cholesterol: 220 mg/dL — ABNORMAL HIGH (ref 0–200)
HDL: 42.5 mg/dL (ref >39.00)
LDL Cholesterol: 150 mg/dL — ABNORMAL HIGH (ref 0–99)
NonHDL: 177.64
Total CHOL/HDL Ratio: 5
Triglycerides: 139 mg/dL (ref 0.0–149.0)
VLDL: 27.8 mg/dL (ref 0.0–40.0)

## 2019-12-26 LAB — COMPREHENSIVE METABOLIC PANEL
ALT: 24 U/L (ref 0–53)
AST: 15 U/L (ref 0–37)
Albumin: 4.3 g/dL (ref 3.5–5.2)
Alkaline Phosphatase: 82 U/L (ref 39–117)
BUN: 15 mg/dL (ref 6–23)
CO2: 27 mEq/L (ref 19–32)
Calcium: 9.2 mg/dL (ref 8.4–10.5)
Chloride: 102 mEq/L (ref 96–112)
Creatinine, Ser: 0.97 mg/dL (ref 0.40–1.50)
GFR: 83.08 mL/min (ref >60.00)
Glucose, Bld: 80 mg/dL (ref 70–99)
Potassium: 4.1 mEq/L (ref 3.5–5.1)
Sodium: 138 mEq/L (ref 135–145)
Total Bilirubin: 1.4 mg/dL — ABNORMAL HIGH (ref 0.2–1.2)
Total Protein: 6.9 g/dL (ref 6.0–8.3)

## 2019-12-26 LAB — PSA: PSA: 0.81 ng/mL (ref 0.10–4.00)

## 2019-12-27 ENCOUNTER — Other Ambulatory Visit: Payer: Self-pay | Admitting: Family Medicine

## 2019-12-27 DIAGNOSIS — E782 Mixed hyperlipidemia: Secondary | ICD-10-CM

## 2019-12-30 ENCOUNTER — Other Ambulatory Visit: Payer: Self-pay

## 2019-12-30 ENCOUNTER — Ambulatory Visit (HOSPITAL_BASED_OUTPATIENT_CLINIC_OR_DEPARTMENT_OTHER)
Admission: RE | Admit: 2019-12-30 | Discharge: 2019-12-30 | Disposition: A | Discharge status: Home / Self Care | Payer: BC Managed Care – PPO | Source: Ambulatory Visit | Attending: Family Medicine | Admitting: Family Medicine

## 2019-12-30 ENCOUNTER — Other Ambulatory Visit: Payer: Self-pay | Admitting: Cardiology

## 2019-12-30 DIAGNOSIS — F17210 Nicotine dependence, cigarettes, uncomplicated: Secondary | ICD-10-CM | POA: Diagnosis not present

## 2019-12-30 DIAGNOSIS — Z87891 Personal history of nicotine dependence: Secondary | ICD-10-CM | POA: Diagnosis not present

## 2019-12-30 NOTE — Telephone Encounter (Signed)
rx refill

## 2019-12-31 ENCOUNTER — Encounter: Payer: Self-pay | Admitting: Family Medicine

## 2019-12-31 DIAGNOSIS — J439 Emphysema, unspecified: Secondary | ICD-10-CM | POA: Insufficient documentation

## 2020-02-10 ENCOUNTER — Other Ambulatory Visit: Payer: Self-pay

## 2020-02-10 ENCOUNTER — Other Ambulatory Visit (INDEPENDENT_AMBULATORY_CARE_PROVIDER_SITE_OTHER): Payer: BC Managed Care – PPO

## 2020-02-10 ENCOUNTER — Other Ambulatory Visit: Payer: Self-pay | Admitting: Family Medicine

## 2020-02-10 DIAGNOSIS — E782 Mixed hyperlipidemia: Secondary | ICD-10-CM | POA: Diagnosis not present

## 2020-02-10 DIAGNOSIS — J42 Unspecified chronic bronchitis: Secondary | ICD-10-CM

## 2020-02-10 LAB — LIPID PANEL
Cholesterol: 225 mg/dL — ABNORMAL HIGH (ref 0–200)
HDL: 43.9 mg/dL (ref >39.00)
LDL Cholesterol: 150 mg/dL — ABNORMAL HIGH (ref 0–99)
NonHDL: 180.88
Total CHOL/HDL Ratio: 5
Triglycerides: 152 mg/dL — ABNORMAL HIGH (ref 0.0–149.0)
VLDL: 30.4 mg/dL (ref 0.0–40.0)

## 2020-02-11 ENCOUNTER — Telehealth: Payer: Self-pay | Admitting: Family Medicine

## 2020-02-11 ENCOUNTER — Other Ambulatory Visit: Payer: Self-pay | Admitting: Family Medicine

## 2020-02-11 DIAGNOSIS — E782 Mixed hyperlipidemia: Secondary | ICD-10-CM

## 2020-02-11 MED ORDER — ROSUVASTATIN CALCIUM 20 MG PO TABS: 20.0000 mg | ORAL_TABLET | Freq: Every day | ORAL | 3 refills | Status: DC

## 2020-02-11 NOTE — Telephone Encounter (Signed)
Sent. Please schedule a lab visit in 6 weeks and order a lipid panel and hep function panel. Thank you.

## 2020-02-11 NOTE — Telephone Encounter (Signed)
Patient is agreeing on medication for cholesterol.   [facility] - [place], [place] - [address] [place] [address] Phone:  [phone]  Fax:  [phone]

## 2020-02-11 NOTE — Telephone Encounter (Signed)
Called left message to call back. Put in order for labs

## 2020-02-11 NOTE — Telephone Encounter (Signed)
Called and scheduled lab appt

## 2020-03-05 DIAGNOSIS — Z96651 Presence of right artificial knee joint: Secondary | ICD-10-CM | POA: Diagnosis not present

## 2020-03-13 ENCOUNTER — Telehealth: Payer: Self-pay | Admitting: Family Medicine

## 2020-03-13 NOTE — Telephone Encounter (Signed)
Called the patient informed of response. The patient states he will try to coQ10 first to see if this helps. Will check in on Monday or next week with PCP to see how this is helping.

## 2020-03-13 NOTE — Telephone Encounter (Signed)
Patient states he would like to speak to you in regards to having some side of effects(leg cramping) to his cholesterol medicine (crestor).

## 2020-03-13 NOTE — Telephone Encounter (Signed)
Advise patient, options: 1.  Start taking co-Q10 OTC daily and see if that helps 2.  He could hold Restoril for 2 weeks and then restart.  See if by not taking statins the cramping decrease

## 2020-03-13 NOTE — Telephone Encounter (Signed)
Called and the patients legs are cramping at night. He spoke to his pharmacist and was told to call our office. Patient is still taking the medication (rosuvastatin/crestor). Patient started taking the first of January. Advise in the absence of PCP.

## 2020-03-24 ENCOUNTER — Other Ambulatory Visit (INDEPENDENT_AMBULATORY_CARE_PROVIDER_SITE_OTHER): Payer: BC Managed Care – PPO

## 2020-03-24 ENCOUNTER — Other Ambulatory Visit: Payer: Self-pay

## 2020-03-24 DIAGNOSIS — E782 Mixed hyperlipidemia: Secondary | ICD-10-CM | POA: Diagnosis not present

## 2020-03-24 LAB — LIPID PANEL
Cholesterol: 127 mg/dL (ref 0–200)
HDL: 46.7 mg/dL (ref >39.00)
LDL Cholesterol: 58 mg/dL (ref 0–99)
NonHDL: 79.83
Total CHOL/HDL Ratio: 3
Triglycerides: 109 mg/dL (ref 0.0–149.0)
VLDL: 21.8 mg/dL (ref 0.0–40.0)

## 2020-03-24 LAB — HEPATIC FUNCTION PANEL
ALT: 30 U/L (ref 0–53)
AST: 24 U/L (ref 0–37)
Albumin: 4.1 g/dL (ref 3.5–5.2)
Alkaline Phosphatase: 74 U/L (ref 39–117)
Bilirubin, Direct: 0.2 mg/dL (ref 0.0–0.3)
Total Bilirubin: 1.4 mg/dL — ABNORMAL HIGH (ref 0.2–1.2)
Total Protein: 6.6 g/dL (ref 6.0–8.3)

## 2020-04-14 ENCOUNTER — Other Ambulatory Visit: Payer: Self-pay | Admitting: Family Medicine

## 2020-04-14 DIAGNOSIS — J42 Unspecified chronic bronchitis: Secondary | ICD-10-CM

## 2020-04-23 ENCOUNTER — Other Ambulatory Visit: Payer: Self-pay

## 2020-04-23 ENCOUNTER — Ambulatory Visit (HOSPITAL_COMMUNITY)
Admission: RE | Admit: 2020-04-23 | Discharge: 2020-04-23 | Disposition: A | Discharge status: Home / Self Care | Payer: BC Managed Care – PPO | Source: Ambulatory Visit | Attending: Physician Assistant | Admitting: Physician Assistant

## 2020-04-23 ENCOUNTER — Encounter (HOSPITAL_COMMUNITY): Payer: Self-pay | Admitting: Physician Assistant

## 2020-04-23 VITALS — BP 158/82 | HR 118 | Ht 70.0 in | Wt 244.4 lb

## 2020-04-23 DIAGNOSIS — E669 Obesity, unspecified: Secondary | ICD-10-CM | POA: Insufficient documentation

## 2020-04-23 DIAGNOSIS — I480 Paroxysmal atrial fibrillation: Secondary | ICD-10-CM | POA: Diagnosis not present

## 2020-04-23 DIAGNOSIS — I4819 Other persistent atrial fibrillation: Secondary | ICD-10-CM | POA: Insufficient documentation

## 2020-04-23 DIAGNOSIS — I429 Cardiomyopathy, unspecified: Secondary | ICD-10-CM | POA: Insufficient documentation

## 2020-04-23 DIAGNOSIS — Z7901 Long term (current) use of anticoagulants: Secondary | ICD-10-CM | POA: Diagnosis not present

## 2020-04-23 DIAGNOSIS — Z79899 Other long term (current) drug therapy: Secondary | ICD-10-CM | POA: Insufficient documentation

## 2020-04-23 DIAGNOSIS — R000 Tachycardia, unspecified: Secondary | ICD-10-CM | POA: Insufficient documentation

## 2020-04-23 DIAGNOSIS — E785 Hyperlipidemia, unspecified: Secondary | ICD-10-CM | POA: Diagnosis not present

## 2020-04-23 DIAGNOSIS — Z981 Arthrodesis status: Secondary | ICD-10-CM | POA: Diagnosis not present

## 2020-04-23 DIAGNOSIS — I10 Essential (primary) hypertension: Secondary | ICD-10-CM | POA: Insufficient documentation

## 2020-04-23 DIAGNOSIS — Z7951 Long term (current) use of inhaled steroids: Secondary | ICD-10-CM | POA: Diagnosis not present

## 2020-04-23 DIAGNOSIS — Z6835 Body mass index (BMI) 35.0-35.9, adult: Secondary | ICD-10-CM | POA: Insufficient documentation

## 2020-04-23 DIAGNOSIS — G4733 Obstructive sleep apnea (adult) (pediatric): Secondary | ICD-10-CM | POA: Diagnosis not present

## 2020-04-23 DIAGNOSIS — Z96651 Presence of right artificial knee joint: Secondary | ICD-10-CM | POA: Diagnosis not present

## 2020-04-23 DIAGNOSIS — Z8249 Family history of ischemic heart disease and other diseases of the circulatory system: Secondary | ICD-10-CM | POA: Diagnosis not present

## 2020-04-23 DIAGNOSIS — F17210 Nicotine dependence, cigarettes, uncomplicated: Secondary | ICD-10-CM | POA: Diagnosis not present

## 2020-04-23 LAB — CBC
HCT: 48.4 % (ref 39.0–52.0)
Hemoglobin: 16.4 g/dL (ref 13.0–17.0)
MCH: 32.2 pg (ref 26.0–34.0)
MCHC: 33.9 g/dL (ref 30.0–36.0)
MCV: 95.1 fL (ref 80.0–100.0)
Platelets: 195 10*3/uL (ref 150–400)
RBC: 5.09 MIL/uL (ref 4.22–5.81)
RDW: 13.9 % (ref 11.5–15.5)
WBC: 9.6 10*3/uL (ref 4.0–10.5)
nRBC: 0 % (ref 0.0–0.2)

## 2020-04-23 LAB — COMPREHENSIVE METABOLIC PANEL
ALT: 42 U/L (ref 0–44)
AST: 29 U/L (ref 15–41)
Albumin: 4.1 g/dL (ref 3.5–5.0)
Alkaline Phosphatase: 62 U/L (ref 38–126)
Anion gap: 11 (ref 5–15)
BUN: 24 mg/dL — ABNORMAL HIGH (ref 8–23)
CO2: 22 mmol/L (ref 22–32)
Calcium: 8.8 mg/dL — ABNORMAL LOW (ref 8.9–10.3)
Chloride: 104 mmol/L (ref 98–111)
Creatinine, Ser: 1 mg/dL (ref 0.61–1.24)
GFR, Estimated: >60 mL/min (ref >60)
Glucose, Bld: 95 mg/dL (ref 70–99)
Potassium: 3.5 mmol/L (ref 3.5–5.1)
Sodium: 137 mmol/L (ref 135–145)
Total Bilirubin: 1.6 mg/dL — ABNORMAL HIGH (ref 0.3–1.2)
Total Protein: 7 g/dL (ref 6.5–8.1)

## 2020-04-23 LAB — TSH: TSH: 0.8 u[IU]/mL (ref 0.350–4.500)

## 2020-04-23 MED ORDER — METOPROLOL SUCCINATE ER 25 MG PO TB24: 25.0000 mg | ORAL_TABLET | Freq: Two times a day (BID) | ORAL | 2 refills | Status: DC

## 2020-04-23 MED ORDER — METOPROLOL SUCCINATE ER 25 MG PO TB24: 25.0000 mg | ORAL_TABLET | Freq: Two times a day (BID) | ORAL | 3 refills | Status: DC

## 2020-04-23 NOTE — Patient Instructions (Signed)
Increase metoprolol to 25mg twice a day

## 2020-04-23 NOTE — Progress Notes (Signed)
Primary Care Physician: [name], DO Primary Cardiologist: Dr Lovena Le Primary Electrophysiologist: Dr Curt Bears Referring Physician: Dr Sheliah Mends Mitchell Parrish is a 65 y.o. male with a history of persistent atrial fibrillation, HTN, HLD, OSA, tachycardia mediated CM who presents for follow up in the [facility]. Patient is on Eliquis for a CHADS2VASC score of 1. He was maintained on amiodarone. He underwent afib ablation with Dr Curt Bears on 04/03/19.   On follow up today, patient reports that since this past weekend, his heart rate has been more elevated in the 90s-low 100s. He denies any infections symptoms such as cough, fever, or GI upset. No symptoms of fluid overload. He does have seasonal allergies but has not taken any antihistamines or decongestants. He has started a keto diet and has lost ~ 16 lbs. He is not currently on anticoagulation with a low CV score. He does admit to drinking 3-4 shots of liquor nightly.    Today, he denies symptoms of chest pain, shortness of breath, orthopnea, PND, lower extremity edema, dizziness, presyncope, syncope, snoring, daytime somnolence, bleeding, or neurologic sequela. The patient is tolerating medications without difficulties and is otherwise without complaint today.    Atrial Fibrillation Risk Factors:  he does have symptoms or diagnosis of sleep apnea. he is compliant with CPAP therapy. He does have a history of alcohol use.    he has a BMI of Body mass index is 35.07 kg/m.Marland Kitchen Filed Weights   04/23/20 1510  Weight: 110.9 kg    Family History  Problem Relation Age of Onset  . Diabetes Mother   . Lung cancer Mother 9  . Diabetes Father   . Heart disease Father   . Colon cancer Father 15       rectal cancer     Atrial Fibrillation Management history:  Previous antiarrhythmic drugs: amiodarone Previous cardioversions: 06/25/2018 Previous ablations: 04/03/19 CHADS2VASC score:  1 Anticoagulation history: Eliquis   Past Medical History:  Diagnosis Date  . Acute diastolic CHF (congestive heart failure) ([place]) 05/29/2018  . Arthritis    left hip  . Atrial fibrillation ([place])   . Emphysema lung ([place])   . Hyperlipidemia   . Hypertension   . Tubular adenoma of colon 11/2011   Past Surgical History:  Procedure Laterality Date  . ATRIAL FIBRILLATION ABLATION N/A 04/03/2019   Procedure: ATRIAL FIBRILLATION ABLATION;  Surgeon: Constance Haw, MD;  Location: [facility];  Service: Cardiovascular;  Laterality: N/A;  . BACK SURGERY    . CARDIOVERSION N/A 06/25/2018   Procedure: CARDIOVERSION (CATH LAB);  Surgeon: Evans Lance, MD;  Location: [facility];  Service: Cardiovascular;  Laterality: N/A;  . HERNIA REPAIR    . SPINE SURGERY     cervical fusion c6, c7 (1994), and c3,4,5,6 (2004)  . TOTAL KNEE ARTHROPLASTY Right 01/28/2019   Procedure: TOTAL KNEE ARTHROPLASTY;  Surgeon: Gaynelle Arabian, MD;  Location: [facility];  Service: Orthopedics;  Laterality: Right;  . WISDOM TOOTH EXTRACTION      Current Outpatient Medications  Medication Sig Dispense Refill  . albuterol (VENTOLIN HFA) 108 (90 Base) MCG/ACT inhaler INHALE 2 PUFFS INTO THE LUNGS EVERY 4 HOURS AS NEEDED FOR WHEEZING OR SHORTNESS OF BREATH 8.5 g 1  . budesonide-formoterol (SYMBICORT) 160-4.5 MCG/ACT inhaler Inhale 1 puff into the lungs in the morning and at bedtime. 10.2 g 12  . co-enzyme Q-10 30 MG capsule Take 200 mg by mouth daily.    Marland Kitchen  fluticasone (FLONASE) 50 MCG/ACT nasal spray Place 2 sprays into both nostrils as needed for allergies or rhinitis. 16 g 5  . furosemide (LASIX) 40 MG tablet Take 1 tablet (40 mg total) by mouth daily as needed. (Patient taking differently: Take 40 mg by mouth daily.) 30 tablet 1  . INCRUSE ELLIPTA 62.5 MCG/INH AEPB INHALE 1 PUFF INTO THE LUNGS DAILY 30 each 2  . losartan (COZAAR) 50 MG tablet TAKE 1 TABLET BY MOUTH DAILY 90 tablet 1  . metoprolol  succinate (TOPROL-XL) 25 MG 24 hr tablet TAKE 1 TABLET BY MOUTH EACH NIGHT AT BEDTIME 90 tablet 3  . rosuvastatin (CRESTOR) 20 MG tablet Take 1 tablet (20 mg total) by mouth daily. 90 tablet 3  . varenicline (CHANTIX PAK) 0.5 MG X 11 & 1 MG X 42 tablet Take 0.5 mg tab by mouth daily for 3 days, then increase to 0.5 mg tab twice daily for 4 days, then increase to one 1 mg tab twice daily. (Patient not taking: Reported on 04/23/2020) 53 tablet 0   No current facility-administered medications for this encounter.    Allergies  Allergen Reactions  . Lodine [Etodolac] Hives    Social History   Socioeconomic History  . Marital status: Married    Spouse name: Not on file  . Number of children: 4  . Years of education: Not on file  . Highest education level: Not on file  Occupational History  . Occupation: Nurse, mental health: [employer]  Tobacco Use  . Smoking status: Current Some Day Smoker    Packs/day: 1.00    Years: 45.00    Pack years: 45.00    Types: Cigarettes  . Smokeless tobacco: Never Used  Vaping Use  . Vaping Use: Never used  Substance and Sexual Activity  . Alcohol use: Yes    Alcohol/week: 1.0 - 2.0 standard drink    Types: 1 - 2 Cans of beer per week    Comment: 2-3 drinks/ day  . Drug use: No  . Sexual activity: Yes  Other Topics Concern  . Not on file  Social History Narrative  . Not on file   Social Determinants of Health   Financial Resource Strain: Not on file  Food Insecurity: Not on file  Transportation Needs: Not on file  Physical Activity: Not on file  Stress: Not on file  Social Connections: Not on file  Intimate Partner Violence: Not on file     ROS- All systems are reviewed and negative except as per the HPI above.  Physical Exam: Vitals:   04/23/20 1510  BP: (!) 158/82  Pulse: (!) 118  Weight: 110.9 kg  Height: 5\' 10"  (1.778 m)    GEN- The patient is a well appearing obese male, alert and oriented x 3 today.   HEENT-head  normocephalic, atraumatic, sclera clear, conjunctiva pink, hearing intact, trachea midline. Lungs- Clear to ausculation bilaterally, normal work of breathing Heart- Regular rate and rhythm, tachycardia, no murmurs, rubs or gallops  GI- soft, NT, ND, + BS Extremities- no clubbing, cyanosis, or edema MS- no significant deformity or atrophy Skin- no rash or lesion Psych- euthymic mood, full affect Neuro- strength and sensation are intact   Wt Readings from Last 3 Encounters:  04/23/20 110.9 kg  12/25/19 115 kg  10/22/19 115.7 kg    EKG today demonstrates  Sinus tachycardia  Vent. rate 118 BPM PR interval 128 ms QRS duration 84 ms QT/QTc 336/470 ms  Echo 01/01/19 demonstrated  1. Left ventricular ejection fraction, by visual estimation, is 60 to  65%. The left ventricle has normal function. There is no left ventricular  hypertrophy.  2. The left ventricle has no regional wall motion abnormalities.  3. Left ventricular diastolic parameters are consistent with Grade II  diastolic dysfunction (pseudonormalization).  4. Global right ventricle has normal systolic function.The right  ventricular size is normal. No increase in right ventricular wall  thickness.  5. Left atrial size was normal.  6. Right atrial size was normal.  7. The mitral valve is normal in structure. Trace mitral valve  regurgitation. No evidence of mitral stenosis.  8. The tricuspid valve is normal in structure. Tricuspid valve  regurgitation is trivial.  9. The aortic valve is tricuspid. Aortic valve regurgitation is not  visualized. No evidence of aortic valve sclerosis or stenosis.  10. There is mild dilatation of the aortic root measuring 38 mm.  11. The inferior vena cava is normal in size with greater than 50%  respiratory variability, suggesting right atrial pressure of 3 mmHg.  12. The tricuspid regurgitant velocity is 2.51 m/s, and with an assumed  right atrial pressure of 3 mmHg, the  estimated right ventricular systolic  pressure is normal at 28.2 mmHg.   Epic records are reviewed at length today  CHA2DS2-VASc Score = 1  The patient's score is based upon: CHF History: No HTN History: Yes Diabetes History: No Stroke History: No Vascular Disease History: No      ASSESSMENT AND PLAN: 1. Persistent Atrial Fibrillation (ICD10:  I48.19) The patient's CHA2DS2-VASc score is 1, indicating a 0.6% annual risk of stroke.   S/p afib ablation 04/03/19 with Dr Curt Bears. Patient appears to be in sinus tachycardia today. Unclear why his heart rate is more elevated recently.  Check cbc/cmet/tsh today.  Increase Toprol to 25 mg BID Continue Eliquis 5 mg BID Encouraged alcohol reduction.  2. Obesity Body mass index is 35.07 kg/m. Lifestyle modification was discussed and encouraged including regular physical activity and weight reduction.  3. Obstructive sleep apnea Patient reports compliance with CPAP therapy.  4. HTN Stable, med changes as above.  5. Cardiomyopathy Suspected tachycardia mediated.  Normalized with SR.   Follow up in the AF clinic in 2 weeks.    [facility] [address], [place] [address] [phone] 04/23/2020 3:24 PM

## 2020-04-24 ENCOUNTER — Other Ambulatory Visit (HOSPITAL_COMMUNITY): Payer: Self-pay | Admitting: *Deleted

## 2020-04-24 MED ORDER — POTASSIUM CHLORIDE ER 10 MEQ PO TBCR: 10.0000 meq | EXTENDED_RELEASE_TABLET | Freq: Every day | ORAL | 3 refills | Status: DC | PRN

## 2020-05-07 ENCOUNTER — Other Ambulatory Visit: Payer: Self-pay

## 2020-05-07 ENCOUNTER — Ambulatory Visit (HOSPITAL_COMMUNITY)
Admission: RE | Admit: 2020-05-07 | Discharge: 2020-05-07 | Disposition: A | Discharge status: Home / Self Care | Payer: BC Managed Care – PPO | Source: Ambulatory Visit | Attending: Physician Assistant | Admitting: Physician Assistant

## 2020-05-07 ENCOUNTER — Encounter (HOSPITAL_COMMUNITY): Payer: Self-pay | Admitting: Physician Assistant

## 2020-05-07 VITALS — BP 150/78 | HR 81 | Ht 70.0 in | Wt 243.0 lb

## 2020-05-07 DIAGNOSIS — Z6834 Body mass index (BMI) 34.0-34.9, adult: Secondary | ICD-10-CM | POA: Insufficient documentation

## 2020-05-07 DIAGNOSIS — Z713 Dietary counseling and surveillance: Secondary | ICD-10-CM | POA: Diagnosis not present

## 2020-05-07 DIAGNOSIS — I10 Essential (primary) hypertension: Secondary | ICD-10-CM | POA: Diagnosis not present

## 2020-05-07 DIAGNOSIS — I429 Cardiomyopathy, unspecified: Secondary | ICD-10-CM | POA: Insufficient documentation

## 2020-05-07 DIAGNOSIS — Z79899 Other long term (current) drug therapy: Secondary | ICD-10-CM | POA: Diagnosis not present

## 2020-05-07 DIAGNOSIS — E669 Obesity, unspecified: Secondary | ICD-10-CM | POA: Diagnosis not present

## 2020-05-07 DIAGNOSIS — Z7901 Long term (current) use of anticoagulants: Secondary | ICD-10-CM | POA: Insufficient documentation

## 2020-05-07 DIAGNOSIS — G4733 Obstructive sleep apnea (adult) (pediatric): Secondary | ICD-10-CM | POA: Diagnosis not present

## 2020-05-07 DIAGNOSIS — Z8249 Family history of ischemic heart disease and other diseases of the circulatory system: Secondary | ICD-10-CM | POA: Diagnosis not present

## 2020-05-07 DIAGNOSIS — I4819 Other persistent atrial fibrillation: Secondary | ICD-10-CM | POA: Diagnosis not present

## 2020-05-07 DIAGNOSIS — F17210 Nicotine dependence, cigarettes, uncomplicated: Secondary | ICD-10-CM | POA: Insufficient documentation

## 2020-05-07 NOTE — Progress Notes (Signed)
Primary Care Physician: [name], DO Primary Cardiologist: Dr Lovena Le Primary Electrophysiologist: Dr Curt Bears Referring Physician: Dr Sheliah Mends Mitchell Parrish is a 65 y.o. male with a history of persistent atrial fibrillation, HTN, HLD, OSA, tachycardia mediated CM who presents for follow up in the [facility]. Patient is on Eliquis for a CHADS2VASC score of 1. He was maintained on amiodarone. He underwent afib ablation with Dr Curt Bears on 04/03/19.   On follow up today, patient reports he is doing well since his last visit. He denies any further heart racing symptoms on the higher dose of BB.   Today, he denies symptoms of palpitations, chest pain, shortness of breath, orthopnea, PND, lower extremity edema, dizziness, presyncope, syncope, snoring, daytime somnolence, bleeding, or neurologic sequela. The patient is tolerating medications without difficulties and is otherwise without complaint today.    Atrial Fibrillation Risk Factors:  he does have symptoms or diagnosis of sleep apnea. he is compliant with CPAP therapy. He does have a history of alcohol use.    he has a BMI of Body mass index is 34.87 kg/m.Marland Kitchen Filed Weights   05/07/20 1504  Weight: 110.2 kg    Family History  Problem Relation Age of Onset  . Diabetes Mother   . Lung cancer Mother 2  . Diabetes Father   . Heart disease Father   . Colon cancer Father 43       rectal cancer     Atrial Fibrillation Management history:  Previous antiarrhythmic drugs: amiodarone Previous cardioversions: 06/25/2018 Previous ablations: 04/03/19 CHADS2VASC score: 1 Anticoagulation history: Eliquis   Past Medical History:  Diagnosis Date  . Acute diastolic CHF (congestive heart failure) ([place]) 05/29/2018  . Arthritis    left hip  . Atrial fibrillation ([place])   . Emphysema lung ([place])   . Hyperlipidemia   . Hypertension   . Tubular adenoma of colon 11/2011   Past Surgical  History:  Procedure Laterality Date  . ATRIAL FIBRILLATION ABLATION N/A 04/03/2019   Procedure: ATRIAL FIBRILLATION ABLATION;  Surgeon: Constance Haw, MD;  Location: [facility];  Service: Cardiovascular;  Laterality: N/A;  . BACK SURGERY    . CARDIOVERSION N/A 06/25/2018   Procedure: CARDIOVERSION (CATH LAB);  Surgeon: Evans Lance, MD;  Location: [facility];  Service: Cardiovascular;  Laterality: N/A;  . HERNIA REPAIR    . SPINE SURGERY     cervical fusion c6, c7 (1994), and c3,4,5,6 (2004)  . TOTAL KNEE ARTHROPLASTY Right 01/28/2019   Procedure: TOTAL KNEE ARTHROPLASTY;  Surgeon: Gaynelle Arabian, MD;  Location: [facility];  Service: Orthopedics;  Laterality: Right;  . WISDOM TOOTH EXTRACTION      Current Outpatient Medications  Medication Sig Dispense Refill  . albuterol (VENTOLIN HFA) 108 (90 Base) MCG/ACT inhaler INHALE 2 PUFFS INTO THE LUNGS EVERY 4 HOURS AS NEEDED FOR WHEEZING OR SHORTNESS OF BREATH 8.5 g 1  . budesonide-formoterol (SYMBICORT) 160-4.5 MCG/ACT inhaler Inhale 1 puff into the lungs in the morning and at bedtime. 10.2 g 12  . co-enzyme Q-10 30 MG capsule Take 200 mg by mouth daily.    . fluticasone (FLONASE) 50 MCG/ACT nasal spray Place 2 sprays into both nostrils as needed for allergies or rhinitis. 16 g 5  . furosemide (LASIX) 40 MG tablet Take 1 tablet (40 mg total) by mouth daily as needed. (Patient taking differently: Take 40 mg by mouth daily.) 30 tablet 1  . INCRUSE ELLIPTA 62.5 MCG/INH  AEPB INHALE 1 PUFF INTO THE LUNGS DAILY 30 each 2  . losartan (COZAAR) 50 MG tablet TAKE 1 TABLET BY MOUTH DAILY 90 tablet 1  . metoprolol succinate (TOPROL-XL) 25 MG 24 hr tablet Take 1 tablet (25 mg total) by mouth 2 (two) times daily. 180 tablet 2  . potassium chloride (KLOR-CON) 10 MEQ tablet Take 1 tablet (10 mEq total) by mouth daily as needed. With lasix 30 tablet 3  . rosuvastatin (CRESTOR) 20 MG tablet Take 1 tablet (20 mg total) by mouth daily. 90 tablet  3  . varenicline (CHANTIX PAK) 0.5 MG X 11 & 1 MG X 42 tablet Take 0.5 mg tab by mouth daily for 3 days, then increase to 0.5 mg tab twice daily for 4 days, then increase to one 1 mg tab twice daily. 53 tablet 0   No current facility-administered medications for this encounter.    Allergies  Allergen Reactions  . Lodine [Etodolac] Hives    Social History   Socioeconomic History  . Marital status: Married    Spouse name: Not on file  . Number of children: 4  . Years of education: Not on file  . Highest education level: Not on file  Occupational History  . Occupation: Nurse, mental health: [employer]  Tobacco Use  . Smoking status: Current Some Day Smoker    Packs/day: 1.00    Years: 45.00    Pack years: 45.00    Types: Cigarettes  . Smokeless tobacco: Never Used  Vaping Use  . Vaping Use: Never used  Substance and Sexual Activity  . Alcohol use: Yes    Alcohol/week: 1.0 - 2.0 standard drink    Types: 1 - 2 Cans of beer per week    Comment: 2-3 drinks/ day  . Drug use: No  . Sexual activity: Yes  Other Topics Concern  . Not on file  Social History Narrative  . Not on file   Social Determinants of Health   Financial Resource Strain: Not on file  Food Insecurity: Not on file  Transportation Needs: Not on file  Physical Activity: Not on file  Stress: Not on file  Social Connections: Not on file  Intimate Partner Violence: Not on file     ROS- All systems are reviewed and negative except as per the HPI above.  Physical Exam: Vitals:   05/07/20 1504  BP: (!) 150/78  Pulse: 81  Weight: 110.2 kg  Height: 5\' 10"  (1.778 m)    GEN- The patient is a well appearing obese male, alert and oriented x 3 today.   HEENT-head normocephalic, atraumatic, sclera clear, conjunctiva pink, hearing intact, trachea midline. Lungs- Clear to ausculation bilaterally, normal work of breathing Heart- Regular rate and rhythm, no murmurs, rubs or gallops  GI- soft, NT, ND, +  BS Extremities- no clubbing, cyanosis, or edema MS- no significant deformity or atrophy Skin- no rash or lesion Psych- euthymic mood, full affect Neuro- strength and sensation are intact   Wt Readings from Last 3 Encounters:  05/07/20 110.2 kg  04/23/20 110.9 kg  12/25/19 115 kg    EKG today demonstrates  SR Vent. rate 81 BPM PR interval 128 ms QRS duration 92 ms QT/QTcB 374/434 ms  Echo 01/01/19 demonstrated  1. Left ventricular ejection fraction, by visual estimation, is 60 to  65%. The left ventricle has normal function. There is no left ventricular  hypertrophy.  2. The left ventricle has no regional wall motion abnormalities.  3.  Left ventricular diastolic parameters are consistent with Grade II  diastolic dysfunction (pseudonormalization).  4. Global right ventricle has normal systolic function.The right  ventricular size is normal. No increase in right ventricular wall  thickness.  5. Left atrial size was normal.  6. Right atrial size was normal.  7. The mitral valve is normal in structure. Trace mitral valve  regurgitation. No evidence of mitral stenosis.  8. The tricuspid valve is normal in structure. Tricuspid valve  regurgitation is trivial.  9. The aortic valve is tricuspid. Aortic valve regurgitation is not  visualized. No evidence of aortic valve sclerosis or stenosis.  10. There is mild dilatation of the aortic root measuring 38 mm.  11. The inferior vena cava is normal in size with greater than 50%  respiratory variability, suggesting right atrial pressure of 3 mmHg.  12. The tricuspid regurgitant velocity is 2.51 m/s, and with an assumed  right atrial pressure of 3 mmHg, the estimated right ventricular systolic  pressure is normal at 28.2 mmHg.   Epic records are reviewed at length today  CHA2DS2-VASc Score = 1  The patient's score is based upon: CHF History: No HTN History: Yes Diabetes History: No Stroke History: No Vascular Disease  History: No Age Score: 0 Gender Score: 0      ASSESSMENT AND PLAN: 1. Persistent Atrial Fibrillation (ICD10:  I48.19) The patient's CHA2DS2-VASc score is 1, indicating a 0.6% annual risk of stroke.     S/p afib ablation 04/03/19 with Dr Curt Bears. Continue Toprol 25 mg BID Continue Eliquis 5 mg BID Encouraged alcohol reduction.  2. Obesity Body mass index is 34.87 kg/m. Lifestyle modification was discussed and encouraged including regular physical activity and weight reduction.  3. Obstructive sleep apnea Patient reports compliance with CPAP therapy.  4. HTN Mildly elevated today, controlled at previous visits. Continue to monitor.   5. Cardiomyopathy Suspected tachycardia mediated.  Normalized with SR.   Follow up with Dr Curt Bears per recall.    [facility] [address] [place], [place] [address] [phone] 05/07/2020 3:24 PM

## 2020-06-19 ENCOUNTER — Other Ambulatory Visit: Payer: Self-pay | Admitting: Family Medicine

## 2020-06-19 DIAGNOSIS — J42 Unspecified chronic bronchitis: Secondary | ICD-10-CM

## 2020-06-23 ENCOUNTER — Ambulatory Visit: Payer: BC Managed Care – PPO | Admitting: Family Medicine

## 2020-06-23 ENCOUNTER — Telehealth: Payer: Self-pay

## 2020-06-23 ENCOUNTER — Encounter: Payer: Self-pay | Admitting: Family Medicine

## 2020-06-23 ENCOUNTER — Other Ambulatory Visit: Payer: Self-pay

## 2020-06-23 VITALS — BP 144/82 | HR 79 | Temp 98.2°F | Resp 18 | Ht 70.0 in | Wt 231.0 lb

## 2020-06-23 DIAGNOSIS — D692 Other nonthrombocytopenic purpura: Secondary | ICD-10-CM | POA: Diagnosis not present

## 2020-06-23 DIAGNOSIS — J42 Unspecified chronic bronchitis: Secondary | ICD-10-CM | POA: Diagnosis not present

## 2020-06-23 DIAGNOSIS — E782 Mixed hyperlipidemia: Secondary | ICD-10-CM | POA: Diagnosis not present

## 2020-06-23 DIAGNOSIS — I428 Other cardiomyopathies: Secondary | ICD-10-CM | POA: Diagnosis not present

## 2020-06-23 NOTE — Telephone Encounter (Signed)
PA cancelled by plan.

## 2020-06-23 NOTE — Telephone Encounter (Signed)
PA initiated via Covermymeds; KEY: [id number]. Awaiting determination.

## 2020-06-23 NOTE — Patient Instructions (Signed)
Strong work with your weight loss.   Keep the diet clean and stay active.  Continue to monitor blood pressure.  Let us know if you need anything.

## 2020-06-23 NOTE — Progress Notes (Signed)
Chief Complaint  Patient presents with  . Follow-up    Mitchell Parrish is a 65 y.o. male here for COPD.  Currently treated with Incruse, Symbicort 160-4.5 mcg bid. Compliance is excellent. Uses rescue inhaler <2 times per week. Reports breathing is fair overall. No nighttime awakenings.  Hyperlipidemia Patient presents for dyslipidemia follow up. Currently being treated with Crestor 20 mg/d and compliance with treatment thus far has been good. He denies myalgias. He is adhering to a healthy diet. Exercise: walking, active at work The patient is not known to have coexisting coronary artery disease.  Past Medical History:  Diagnosis Date  . Acute diastolic CHF (congestive heart failure) ([place]) 05/29/2018  . Arthritis    left hip  . Atrial fibrillation ([place])   . Emphysema lung ([place])   . Hyperlipidemia   . Hypertension   . Tubular adenoma of colon 11/2011    BP (!) 144/82 (BP Location: Left Arm, Patient Position: Sitting, Cuff Size: Normal)   Pulse 79   Temp 98.2 F (36.8 C) (Oral)   Resp 18   Ht 5\' 10"  (1.778 m)   Wt 231 lb (104.8 kg)   SpO2 97%   BMI 33.15 kg/m  Gen: Awake, alert HEENT: MMM, nares patent, no polyps Heart: RRR, no LE edema Lungs: CTAB, no accessory muscle use Msk: No clubbing or cyanosis Psych: Age appropriate judgment and insight  Chronic bronchitis, unspecified chronic bronchitis type (HCC)  Mixed hyperlipidemia  Nonischemic cardiomyopathy (HCC), Chronic  Senile purpura ([place]), Chronic  1. Cont Incurse and Symbicort. Controlled, chronic.  2. Cont Crestor 20 mg/d. Counseled on diet/exercise. He has done a good job losing wt.  F/u in 6 mo for CPE or prn. The patient voiced understanding and agreement to the plan.  [place], DO 06/23/20 3:25 PM

## 2020-07-02 DIAGNOSIS — H353130 Nonexudative age-related macular degeneration, bilateral, stage unspecified: Secondary | ICD-10-CM | POA: Diagnosis not present

## 2020-07-02 DIAGNOSIS — H35373 Puckering of macula, bilateral: Secondary | ICD-10-CM | POA: Diagnosis not present

## 2020-07-06 ENCOUNTER — Other Ambulatory Visit: Payer: Self-pay | Admitting: Cardiology

## 2020-07-09 ENCOUNTER — Encounter: Payer: Self-pay | Admitting: Cardiology

## 2020-07-09 ENCOUNTER — Ambulatory Visit: Payer: BC Managed Care – PPO | Admitting: Cardiology

## 2020-07-09 ENCOUNTER — Telehealth: Payer: Self-pay | Admitting: Cardiology

## 2020-07-09 ENCOUNTER — Other Ambulatory Visit: Payer: Self-pay

## 2020-07-09 VITALS — BP 150/84 | HR 58 | Ht 70.0 in | Wt 232.6 lb

## 2020-07-09 DIAGNOSIS — I4819 Other persistent atrial fibrillation: Secondary | ICD-10-CM

## 2020-07-09 MED ORDER — LOSARTAN POTASSIUM 100 MG PO TABS: 100.0000 mg | ORAL_TABLET | Freq: Every day | ORAL | 3 refills | Status: DC

## 2020-07-09 NOTE — Telephone Encounter (Signed)
Pt c/o medication issue:  1. Name of Medication: losartan (COZAAR) 100 MG tablet  2. How are you currently taking this medication (dosage and times per day)? Has not started  3. Are you having a reaction (difficulty breathing--STAT)? no  4. What is your medication issue? Patient states his medication was increased from 50 mg to 100 mg today and he is out of the 50 mg medication. He states it was supposed to be sent to the pharmacy today, but it is not there. He is at the pharmacy now

## 2020-07-09 NOTE — Addendum Note (Signed)
Addended by: Willeen Cass on: 07/09/2020 04:47 PM   Modules accepted: Orders

## 2020-07-09 NOTE — Progress Notes (Signed)
Electrophysiology Office Note   Date:  07/09/2020   ID:  PACEN WATFORD, DOB 1955/12/30, MRN [id number]  PCP:  Mitchell Pal, DO  Cardiologist:  Lovena Le Primary Electrophysiologist: Mitchell Alken, MD    Chief Complaint: AF   History of Present Illness: Mitchell Parrish is a 65 y.o. male who is being seen today for the evaluation of AF at the request of Mitchell Parrish*. Presenting today for electrophysiology evaluation.  He has a history of persistent atrial fibrillation, diastolic heart failure, hypertension, hyperlipidemia.  He is also been on amiodarone.  He developed a tachycardia induced cardiomyopathy but fortunately his ejection fraction improved.  He is status post A. fib ablation 04/26/2019.  Today, denies symptoms of palpitations, chest pain, shortness of breath, orthopnea, PND, lower extremity edema, claudication, dizziness, presyncope, syncope, bleeding, or neurologic sequela. The patient is tolerating medications without difficulties.  Since being seen he has done well.  He has no chest pain or shortness of breath.  He is noted a few times that his heart rate gets above 100.  He sits down and comes back down to normal.  He otherwise feels well and has no major complaints.  Past Medical History:  Diagnosis Date  . Acute diastolic CHF (congestive heart failure) ([place]) 05/29/2018  . Arthritis    left hip  . Atrial fibrillation ([place])   . Emphysema lung ([place])   . Hyperlipidemia   . Hypertension   . Tubular adenoma of colon 11/2011   Past Surgical History:  Procedure Laterality Date  . ATRIAL FIBRILLATION ABLATION N/A 04/03/2019   Procedure: ATRIAL FIBRILLATION ABLATION;  Surgeon: Mitchell Haw, MD;  Location: [facility];  Service: Cardiovascular;  Laterality: N/A;  . BACK SURGERY    . CARDIOVERSION N/A 06/25/2018   Procedure: CARDIOVERSION (CATH LAB);  Surgeon: Mitchell Lance, MD;  Location: [facility];  Service:  Cardiovascular;  Laterality: N/A;  . HERNIA REPAIR    . SPINE SURGERY     cervical fusion c6, c7 (1994), and c3,4,5,6 (2004)  . TOTAL KNEE ARTHROPLASTY Right 01/28/2019   Procedure: TOTAL KNEE ARTHROPLASTY;  Surgeon: Mitchell Arabian, MD;  Location: [facility];  Service: Orthopedics;  Laterality: Right;  . WISDOM TOOTH EXTRACTION       Current Outpatient Medications  Medication Sig Dispense Refill  . albuterol (VENTOLIN HFA) 108 (90 Base) MCG/ACT inhaler Inhale 2 puffs into the lungs every 4 (four) hours as needed for wheezing or shortness of breath. 18 g 5  . budesonide-formoterol (SYMBICORT) 160-4.5 MCG/ACT inhaler Inhale 1 puff into the lungs in the morning and at bedtime. 10.2 g 12  . co-enzyme Q-10 30 MG capsule Take 200 mg by mouth daily.    . fluticasone (FLONASE) 50 MCG/ACT nasal spray Place 2 sprays into both nostrils as needed for allergies or rhinitis. 16 g 5  . furosemide (LASIX) 40 MG tablet TAKE 1 TABLET BY MOUTH DAILY AS NEEDED 30 tablet 1  . INCRUSE ELLIPTA 62.5 MCG/INH AEPB INHALE 1 PUFF INTO THE LUNGS DAILY 30 each 2  . losartan (COZAAR) 50 MG tablet TAKE 1 TABLET BY MOUTH DAILY 90 tablet 1  . metoprolol succinate (TOPROL-XL) 25 MG 24 hr tablet Take 1 tablet (25 mg total) by mouth 2 (two) times daily. 180 tablet 2  . potassium chloride (KLOR-CON) 10 MEQ tablet Take 1 tablet (10 mEq total) by mouth daily as needed. With lasix 30 tablet 3  . rosuvastatin (CRESTOR) 20 MG  tablet Take 1 tablet (20 mg total) by mouth daily. 90 tablet 3   No current facility-administered medications for this visit.    Allergies:   Lodine [etodolac]   Social History:  The patient  reports that he has been smoking cigarettes. He has a 45.00 pack-year smoking history. He has never used smokeless tobacco. He reports current alcohol use of about 1.0 - 2.0 standard drink of alcohol per week. He reports that he does not use drugs.   Family History:  The patient's family history includes Colon cancer (age  of onset: 59) in his father; Diabetes in his father and mother; Heart disease in his father; Lung cancer (age of onset: 59) in his mother.   ROS:  Please see the history of present illness.   Otherwise, review of systems is positive for none.   All other systems are reviewed and negative.   PHYSICAL EXAM: VS:  BP (!) 150/84   Pulse (!) 58   Ht 5\' 10"  (1.778 m)   Wt 232 lb 9.6 oz (105.5 kg)   SpO2 97%   BMI 33.37 kg/m  , BMI Body mass index is 33.37 kg/m. GEN: Well nourished, well developed, in no acute distress  HEENT: normal  Neck: no JVD, carotid bruits, or masses Cardiac: RRR; no murmurs, rubs, or gallops,no edema  Respiratory:  clear to auscultation bilaterally, normal work of breathing GI: soft, nontender, nondistended, + BS MS: no deformity or atrophy  Skin: warm and dry Neuro:  Strength and sensation are intact Psych: euthymic mood, full affect  EKG:  EKG is not ordered today. Personal review of the ekg ordered 05/07/20 shows sinus rhythm, rate 81  Recent Labs: 04/23/2020: ALT 42; BUN 24; Creatinine, Ser 1.00; Hemoglobin 16.4; Platelets 195; Potassium 3.5; Sodium 137; TSH 0.800    Lipid Panel     Component Value Date/Time   CHOL 127 03/24/2020 0717   TRIG 109.0 03/24/2020 0717   HDL 46.70 03/24/2020 0717   CHOLHDL 3 03/24/2020 0717   VLDL 21.8 03/24/2020 0717   LDLCALC 58 03/24/2020 0717     Wt Readings from Last 3 Encounters:  07/09/20 232 lb 9.6 oz (105.5 kg)  06/23/20 231 lb (104.8 kg)  05/07/20 243 lb (110.2 kg)      Other studies Reviewed: Additional studies/ records that were reviewed today include: TTE 01/01/19  Review of the above records today demonstrates:   1. Left ventricular ejection fraction, by visual estimation, is 60 to 65%. The left ventricle has normal function. There is no left ventricular hypertrophy.  2. The left ventricle has no regional wall motion abnormalities.  3. Left ventricular diastolic parameters are consistent with Grade II  diastolic dysfunction (pseudonormalization).  4. Global right ventricle has normal systolic function.The right ventricular size is normal. No increase in right ventricular wall thickness.  5. Left atrial size was normal.  6. Right atrial size was normal.  7. The mitral valve is normal in structure. Trace mitral valve regurgitation. No evidence of mitral stenosis.  8. The tricuspid valve is normal in structure. Tricuspid valve regurgitation is trivial.  9. The aortic valve is tricuspid. Aortic valve regurgitation is not visualized. No evidence of aortic valve sclerosis or stenosis. 10. There is mild dilatation of the aortic root measuring 38 mm. 11. The inferior vena cava is normal in size with greater than 50% respiratory variability, suggesting right atrial pressure of 3 mmHg. 12. The tricuspid regurgitant velocity is 2.51 m/s, and with an assumed right atrial  pressure of 3 mmHg, the estimated right ventricular systolic pressure is normal at 28.2 mmHg.   ASSESSMENT AND PLAN:  1.  Persistent atrial fibrillation: CHA2DS2-VASc of 1.  Status post ablation 04/03/2019.  Currently on metoprolol.  He has not had much more atrial fibrillation and is felt well.  No changes.  2.  Hypertension: Elevated today.  Ilario Dhaliwal increase losartan to 100 mg  3.  Chronic systolic heart failure: Possibly due to a tachycardia mediated cardiomyopathy.  Status post ablation.  Ejection fraction is improved.  4.  Obstructive sleep apnea: CPAP compliance encouraged  Current medicines are reviewed at length with the patient today.   The patient does not have concerns regarding his medicines.  The following changes were made today: Increase losartan  Labs/ tests ordered today include:  No orders of the defined types were placed in this encounter.    Disposition:   FU with Janmarie Smoot 6 months  Signed, Jasmyne Lodato Meredith Leeds, MD  07/09/2020 3:28 PM     [facility] [address] [place]  [address] [phone] (office) [phone] (fax)

## 2020-07-09 NOTE — Telephone Encounter (Signed)
Spoke with patient and he is at the pharmacy and needs his losartan prescription.  Advised patient that losartan 100mg  may not be on formulary but sent the prescription in and he can let us know if it needs to be changed.  Patient appreciative of call.

## 2020-07-23 ENCOUNTER — Other Ambulatory Visit: Payer: Self-pay | Admitting: Family Medicine

## 2020-07-23 DIAGNOSIS — J42 Unspecified chronic bronchitis: Secondary | ICD-10-CM

## 2020-08-05 IMAGING — CT CT HEART MORPH/PULM VEIN W/ CM & W/O CA SCORE
2 of 9 series · 6 of 20 positions shown, 7 images · IV contrast (APPLIED)
Comparison: None.
COMPARISON: None.

Addendum:
EXAM:
OVER-READ INTERPRETATION  CT CHEST

The following report is an over-read performed by radiologist Dr.
Paxa Darina [REDACTED] on 03/29/2019. This
over-read does not include interpretation of cardiac or coronary
anatomy or pathology. The coronary calcium score/coronary CTA
interpretation by the cardiologist is attached.
CLINICAL DATA: Atrial fibrillation
Cardiac CTA
MEDICATIONS:
Sub lingual nitro. 4mg x 2
TECHNIQUE: The patient was scanned on a Siemens [REDACTED]ice scanner. Gantry
rotation speed was 250 msecs. Collimation was 0.6 mm. A 100 kV
prospective scan was triggered in the ascending thoracic aorta at
35-75% of the R-R interval. Average HR during the scan was 60 bpm.
The 3D data set was interpreted on a dedicated work station using
MPR, MIP and VRT modes. A total of 80cc of contrast was used.

[Series 10: best diast · axial · 0.39mm/px · z∈[+1203,+1274]mm · 3 of 357 slices shown, 4 images]
[im 90/357  vessel]
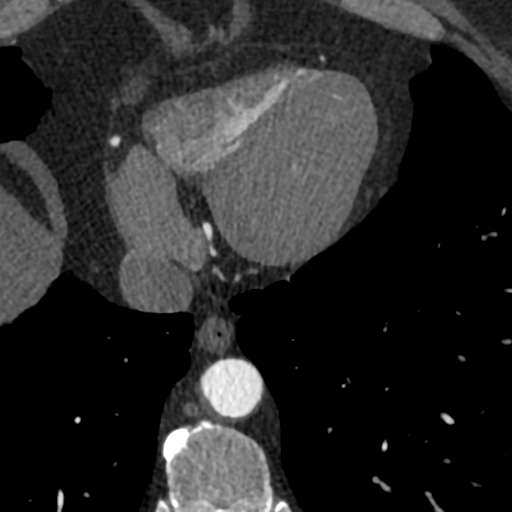
[im 90/357  lung]
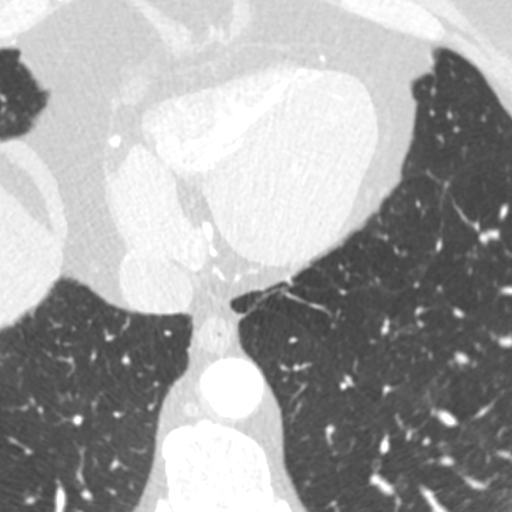
[im 179/357  vessel]
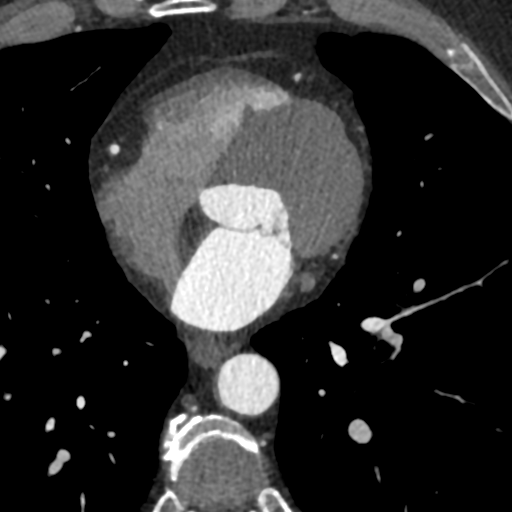
[im 268/357  vessel]
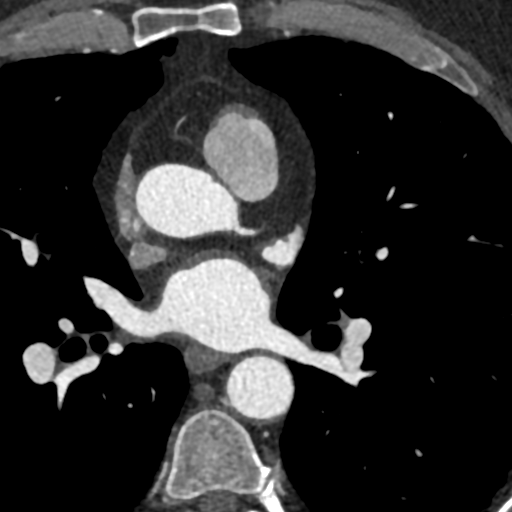

[Series 11: +300 ms · axial · 0.39mm/px · z∈[+1203,+1274]mm · 3 of 357 slices shown]
[im 90/357  vessel]
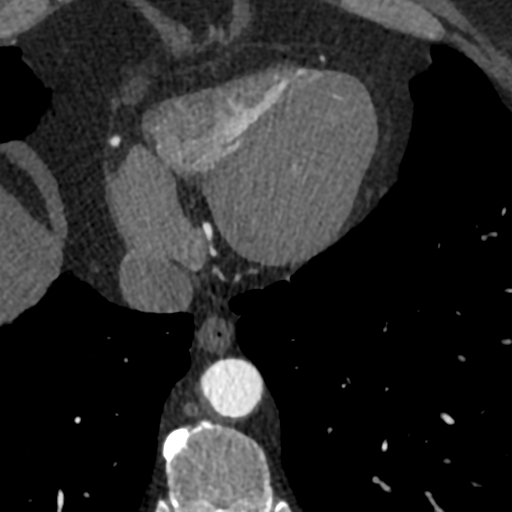
[im 179/357  vessel]
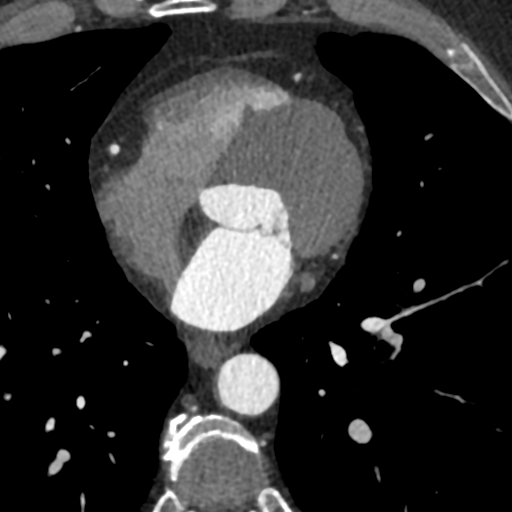
[im 268/357  vessel]
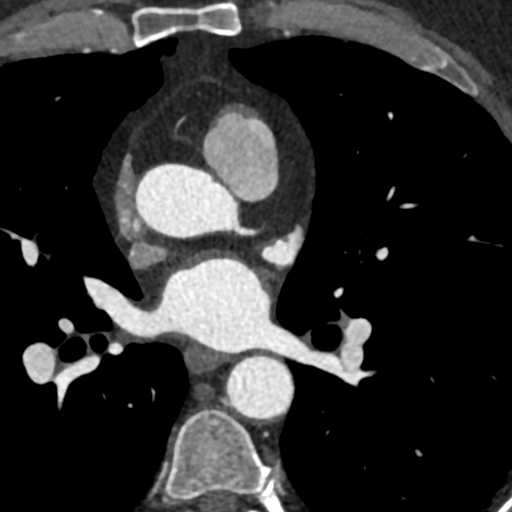

[6 of 20 positions shown; findings below may reference images not displayed]

FINDINGS: Aortic atherosclerosis. Patchy areas of very mild peripheral
predominant ground-glass attenuation and septal thickening noted in
the visualized portions of the lungs. Within the visualized portions
of the thorax there are no suspicious appearing pulmonary nodules or
masses, there is no acute consolidative airspace disease, no pleural
effusions, no pneumothorax and no lymphadenopathy. Visualized
portions of the upper abdomen are unremarkable. There are no
aggressive appearing lytic or blastic lesions noted in the
visualized portions of the skeleton.
IMPRESSION: 1. Subtle findings in the periphery of the mid to lower lungs which
could be indicative of early interstitial lung disease. Further
clinical evaluation is recommended, and consideration for outpatient
referral to Pulmonology for further evaluation should be considered
if clinically appropriate.
2.  Aortic Atherosclerosis (8Z88R-ONJ.J).
FINDINGS: Non-cardiac: See separate report from [REDACTED].

Pulmonary veins drain normally to the left atrium. No LA appendage
or LA thrombus noted.

PVs:

Large trunk gives rise to RUPV and RLPV, 24 x 20 mm

LUPV 17 x 14 mm

LLPV 17 x 13 mm

Calcium Score: 7 Agatston units.

Coronary Arteries: Right dominant with no anomalies

LM: No plaque or stenosis.

LAD system:  No plaque or stenosis.

Circumflex system: No plaque or stenosis.

RCA system: Mixed plaque proximal RCA, minimal stenosis.
IMPRESSION: 1.  No LA appendage thrombus.

2.  Pulmonary veins as noted above.

3. Coronary calcium score 7 Agatston units. This places the patient
in the 32nd percentile for age and gender, suggesting low risk for
future cardiac events.

4.  No obstructive coronary disease noted.

Muhammad Akbar Ginanjar Ohp

*** End of Addendum ***
EXAM:
OVER-READ INTERPRETATION  CT CHEST

The following report is an over-read performed by radiologist Dr.
Paxa Darina [REDACTED] on 03/29/2019. This
over-read does not include interpretation of cardiac or coronary
anatomy or pathology. The coronary calcium score/coronary CTA
interpretation by the cardiologist is attached.
FINDINGS: Aortic atherosclerosis. Patchy areas of very mild peripheral
predominant ground-glass attenuation and septal thickening noted in
the visualized portions of the lungs. Within the visualized portions
of the thorax there are no suspicious appearing pulmonary nodules or
masses, there is no acute consolidative airspace disease, no pleural
effusions, no pneumothorax and no lymphadenopathy. Visualized
portions of the upper abdomen are unremarkable. There are no
aggressive appearing lytic or blastic lesions noted in the
visualized portions of the skeleton.
IMPRESSION: 1. Subtle findings in the periphery of the mid to lower lungs which
could be indicative of early interstitial lung disease. Further
clinical evaluation is recommended, and consideration for outpatient
referral to Pulmonology for further evaluation should be considered
if clinically appropriate.
2.  Aortic Atherosclerosis (8Z88R-ONJ.J).

## 2020-08-28 ENCOUNTER — Other Ambulatory Visit: Payer: Self-pay

## 2020-08-28 ENCOUNTER — Encounter: Payer: Self-pay | Admitting: Emergency Medicine

## 2020-08-28 ENCOUNTER — Ambulatory Visit
Admission: EM | Admit: 2020-08-28 | Discharge: 2020-08-28 | Disposition: A | Discharge status: Home / Self Care | Payer: BC Managed Care – PPO | Attending: Emergency Medicine | Admitting: Emergency Medicine

## 2020-08-28 DIAGNOSIS — R42 Dizziness and giddiness: Secondary | ICD-10-CM

## 2020-08-28 MED ORDER — TIZANIDINE HCL 2 MG PO TABS: 2.0000 mg | ORAL_TABLET | Freq: Four times a day (QID) | ORAL | 0 refills | Status: DC | PRN

## 2020-08-28 MED ORDER — MECLIZINE HCL 25 MG PO TABS: 25.0000 mg | ORAL_TABLET | Freq: Three times a day (TID) | ORAL | 0 refills | Status: DC | PRN

## 2020-08-28 NOTE — ED Triage Notes (Signed)
Dizziness that started yesterday.  When he gets up and moves he gets dizzy.  Called PCP and he told him to come here to be seen.  He is also having neck pain.  He has had neck surgerty in the past.

## 2020-08-28 NOTE — ED Provider Notes (Signed)
[facility]    CSN: [id number] Arrival date & time: 08/28/20  0908      History   Chief Complaint Chief Complaint  Patient presents with   Dizziness    HPI Mitchell Parrish is a 65 y.o. male history of A. fib, diastolic CHF, hypertension, hyperlipidemia, tobacco use, presenting today for evaluation of dizziness and neck pain.  Reports that yesterday he woke up feeling off balance, spinning.  Reports mild associated nausea.  Symptoms subside with rest, symptoms slightly improved today, but still persistent this morning.  Denies associated headache.  Denies chest pain or shortness of breath.  Denies any confusion or difficulty speaking.  Denies any one-sided weakness or numbness or tingling.  He does report associated neck pain, but reports that he often will have chronic neck pain, has had multiple fusions throughout his cervical spine.  Neck pain is slightly to the right and will radiate into upper trapezius.  Feel slightly different than his typical neck pain.  Pain was present prior to onset of dizziness, but does feel pain has worsened since onset of symptoms.  HPI  Past Medical History:  Diagnosis Date   Acute diastolic CHF (congestive heart failure) ([place]) 05/29/2018   Arthritis    left hip   Atrial fibrillation ([facility])    Emphysema lung ([place])    Hyperlipidemia    Hypertension    Tubular adenoma of colon 11/2011    Patient Active Problem List   Diagnosis Date Noted   Emphysema lung ([place])    Senile purpura ([place]) 12/25/2019   OA (osteoarthritis) of knee 01/28/2019   Sinus bradycardia 01/01/2019   Chronic pain of right knee 12/14/2018   Actinic keratosis 12/14/2018   Nonischemic cardiomyopathy ([place]) 10/08/2018   Chronic diastolic heart failure ([place]) 07/31/2018   Atrial fibrillation ([place]) 06/25/2018   Snoring [id number]   Acute diastolic CHF (congestive heart failure) ([place]) 05/29/2018   Chronic obstructive pulmonary disease ([place])    Other emphysema ([facility])     Tobacco abuse    Atrial fibrillation with RVR ([place]) 05/25/2018   Atrial flutter with rapid ventricular response ([place]) 05/25/2018   BPH (benign prostatic hyperplasia) 03/24/2016   Mixed hyperlipidemia 10/30/2012   Hip pain, chronic 10/30/2012   Degeneration of cervical intervertebral disc 10/30/2012   Insomnia 10/30/2012   Numbness of left anterior thigh 03/15/2012   Seborrheic keratosis 09/27/2011   Hypertension 09/26/2011   Obesity (BMI 30-39.9) 09/26/2011   Gilbert's syndrome 09/26/2011   Nicotine abuse 09/26/2011    Past Surgical History:  Procedure Laterality Date   ATRIAL FIBRILLATION ABLATION N/A 04/03/2019   Procedure: ATRIAL FIBRILLATION ABLATION;  Surgeon: Constance Haw, MD;  Location: [facility];  Service: Cardiovascular;  Laterality: N/A;   BACK SURGERY     CARDIOVERSION N/A 06/25/2018   Procedure: CARDIOVERSION (CATH LAB);  Surgeon: Evans Lance, MD;  Location: [facility];  Service: Cardiovascular;  Laterality: N/A;   HERNIA REPAIR     SPINE SURGERY     cervical fusion c6, c7 (1994), and c3,4,5,6 (2004)   TOTAL KNEE ARTHROPLASTY Right 01/28/2019   Procedure: TOTAL KNEE ARTHROPLASTY;  Surgeon: Gaynelle Arabian, MD;  Location: [facility];  Service: Orthopedics;  Laterality: Right;   WISDOM TOOTH EXTRACTION         Home Medications    Prior to Admission medications   Medication Sig Start Date End Date Taking? Authorizing Provider  meclizine (ANTIVERT) 25 MG tablet Take 1 tablet (25 mg total) by  mouth 3 (three) times daily as needed for dizziness. 08/28/20  Yes Antwan Pandya C, PA-C  tiZANidine (ZANAFLEX) 2 MG tablet Take 1-2 tablets (2-4 mg total) by mouth every 6 (six) hours as needed for muscle spasms. 08/28/20  Yes Ruthene Methvin C, PA-C  albuterol (VENTOLIN HFA) 108 (90 Base) MCG/ACT inhaler Inhale 2 puffs into the lungs every 4 (four) hours as needed for wheezing or shortness of breath. 06/22/20   [name], DO   budesonide-formoterol (SYMBICORT) 160-4.5 MCG/ACT inhaler Inhale 1 puff into the lungs in the morning and at bedtime. 12/25/19   [name], DO  co-enzyme Q-10 30 MG capsule Take 200 mg by mouth daily.    [provider]  fluticasone (FLONASE) 50 MCG/ACT nasal spray Place 2 sprays into both nostrils as needed for allergies or rhinitis. 12/14/18   [name], DO  furosemide (LASIX) 40 MG tablet TAKE 1 TABLET BY MOUTH DAILY AS NEEDED 07/08/20   Camnitz, Ocie Doyne, MD  INCRUSE ELLIPTA 62.5 MCG/INH AEPB INHALE 1 PUFF INTO THE LUNGS DAILY 07/24/20   [name], DO  losartan (COZAAR) 100 MG tablet Take 1 tablet (100 mg total) by mouth daily. 07/09/20 10/07/20  Camnitz, Ocie Doyne, MD  metoprolol succinate (TOPROL-XL) 25 MG 24 hr tablet Take 1 tablet (25 mg total) by mouth 2 (two) times daily. 04/23/20   Fenton, Clint R, PA  potassium chloride (KLOR-CON) 10 MEQ tablet Take 1 tablet (10 mEq total) by mouth daily as needed. With lasix 04/24/20 07/23/20  Fenton, Clint R, PA  rosuvastatin (CRESTOR) 20 MG tablet Take 1 tablet (20 mg total) by mouth daily. 02/11/20   [name], DO    Family History Family History  Problem Relation Age of Onset   Diabetes Mother    Lung cancer Mother 16   Diabetes Father    Heart disease Father    Colon cancer Father 3       rectal cancer    Social History Social History   Tobacco Use   Smoking status: Some Days    Packs/day: 1.00    Years: 45.00    Pack years: 45.00    Types: Cigarettes   Smokeless tobacco: Never  Vaping Use   Vaping Use: Never used  Substance Use Topics   Alcohol use: Yes    Alcohol/week: 1.0 - 2.0 standard drink    Types: 1 - 2 Cans of beer per week    Comment: 2-3 drinks/ day   Drug use: No     Allergies   Lodine [etodolac]   Review of Systems Review of Systems  Constitutional:  Negative for fatigue and fever.  HENT:  Negative for congestion, sinus pressure and sore  throat.   Eyes:  Negative for photophobia, pain and visual disturbance.  Respiratory:  Negative for cough and shortness of breath.   Cardiovascular:  Negative for chest pain.  Gastrointestinal:  Negative for abdominal pain, nausea and vomiting.  Genitourinary:  Negative for decreased urine volume and hematuria.  Musculoskeletal:  Positive for neck pain. Negative for myalgias and neck stiffness.  Neurological:  Positive for dizziness. Negative for syncope, facial asymmetry, speech difficulty, weakness, light-headedness, numbness and headaches.    Physical Exam Triage Vital Signs ED Triage Vitals  Enc Vitals Group     BP      Pulse      Resp      Temp      Temp src  SpO2      Weight      Height      Head Circumference      Peak Flow      Pain Score      Pain Loc      Pain Edu?      Excl. in [place]?    Orthostatic VS for the past 24 hrs:  BP- Lying Pulse- Lying BP- Sitting Pulse- Sitting BP- Standing at 0 minutes Pulse- Standing at 0 minutes  08/28/20 1013 128/71 63 145/79 63 146/76 68    Updated Vital Signs BP (!) 145/69 (BP Location: Left Arm)   Pulse 63   Temp 98 F (36.7 C) (Oral)   Resp 18   Wt 222 lb (100.7 kg)   SpO2 95%   BMI 31.85 kg/m   Visual Acuity Right Eye Distance:   Left Eye Distance:   Bilateral Distance:    Right Eye Near:   Left Eye Near:    Bilateral Near:     Physical Exam Vitals and nursing note reviewed.  Constitutional:      Appearance: He is well-developed.     Comments: No acute distress  HENT:     Head: Normocephalic and atraumatic.     Ears:     Comments: Bilateral ears without tenderness to palpation of external auricle, tragus and mastoid, EAC's without erythema or swelling, TM's with good bony landmarks and cone of light. Non erythematous.      Nose: Nose normal.     Mouth/Throat:     Comments: Oral mucosa pink and moist, no tonsillar enlargement or exudate. Posterior pharynx patent and nonerythematous, no uvula deviation  or swelling. Normal phonation.  Eyes:     Conjunctiva/sclera: Conjunctivae normal.  Cardiovascular:     Rate and Rhythm: Normal rate and regular rhythm.  Pulmonary:     Effort: Pulmonary effort is normal. No respiratory distress.     Comments: Breathing comfortably at rest, CTABL, no wheezing, rales or other adventitious sounds auscultated  Abdominal:     General: There is no distension.  Musculoskeletal:        General: Normal range of motion.     Cervical back: Neck supple.  Skin:    General: Skin is warm and dry.  Neurological:     Mental Status: He is alert and oriented to person, place, and time.     Comments: Patient A&O x3, cranial nerves II-XII grossly intact, strength at shoulders, hips and knees 5/5, equal bilaterally. Normal  RAM and heel to shin. Negative Romberg.. Gait without abnormality.      UC Treatments / Results  Labs (all labs ordered are listed, but only abnormal results are displayed) Labs Reviewed - No data to display  EKG   Radiology No results found.  Procedures Procedures (including critical care time)  Medications Ordered in UC Medications - No data to display  Initial Impression / Assessment and Plan / UC Course  I have reviewed the triage vital signs and the nursing notes.  Pertinent labs & imaging results that were available during my care of the patient were reviewed by me and considered in my medical decision making (see chart for details).     EKG normal sinus rhythm, negative orthostatics, dizziness symptoms suggestive of vertigo given positional and spinning sensation.  No neurodeficits on exam, but did discuss with patient concern for associated neck pain.  He has chronic neck pain which likely is source of this, and dizziness is slightly improved  from yesterday, but advised if dizziness worsening along with neck pain developing vision changes to go to emergency room for potential CT scan to rule out dissection.  Anti-inflammatories  and muscle relaxers in the meantime for neck pain.  Meclizine and positional maneuvers for vertigo.  Follow-up with PCP.  Discussed strict return precautions. Patient verbalized understanding and is agreeable with plan.  Final Clinical Impressions(s) / UC Diagnoses   Final diagnoses:  Dizziness     Discharge Instructions      Please perform Epley and logroll/BBQ maneuver at home- see videos on youtube Meclizine as needed for dizziness-may cause drowsiness Drink plenty of water Tylenol/ibuprofen for neck pain May supplement with tizanidine at home, this is a muscle relaxer, may cause drowsiness, do not use with meclizine  If you are developing worsening dizziness, neck pain, headache, vision changes, please go to emergency room for further evaluation     ED Prescriptions     Medication Sig Dispense Auth. Provider   meclizine (ANTIVERT) 25 MG tablet Take 1 tablet (25 mg total) by mouth 3 (three) times daily as needed for dizziness. 30 tablet Yekaterina Escutia C, PA-C   tiZANidine (ZANAFLEX) 2 MG tablet Take 1-2 tablets (2-4 mg total) by mouth every 6 (six) hours as needed for muscle spasms. 30 tablet Elna Radovich, [place] C, PA-C      PDMP not reviewed this encounter.   Janith Lima, PA-C 08/28/20 1145

## 2020-08-28 NOTE — Discharge Instructions (Addendum)
Please perform Epley and logroll/BBQ maneuver at home- see videos on youtube Meclizine as needed for dizziness-may cause drowsiness Drink plenty of water Tylenol/ibuprofen for neck pain May supplement with tizanidine at home, this is a muscle relaxer, may cause drowsiness, do not use with meclizine  If you are developing worsening dizziness, neck pain, headache, vision changes, please go to emergency room for further evaluation

## 2020-10-16 ENCOUNTER — Other Ambulatory Visit: Payer: Self-pay | Admitting: Cardiology

## 2020-10-16 ENCOUNTER — Other Ambulatory Visit (HOSPITAL_COMMUNITY): Payer: Self-pay | Admitting: Physician Assistant

## 2020-10-30 ENCOUNTER — Other Ambulatory Visit: Payer: Self-pay | Admitting: Family Medicine

## 2020-10-30 DIAGNOSIS — J42 Unspecified chronic bronchitis: Secondary | ICD-10-CM

## 2021-01-05 ENCOUNTER — Other Ambulatory Visit: Payer: Self-pay | Admitting: Family Medicine

## 2021-01-05 DIAGNOSIS — J42 Unspecified chronic bronchitis: Secondary | ICD-10-CM

## 2021-01-12 ENCOUNTER — Encounter: Payer: Self-pay | Admitting: Family Medicine

## 2021-01-12 ENCOUNTER — Ambulatory Visit (INDEPENDENT_AMBULATORY_CARE_PROVIDER_SITE_OTHER): Payer: BC Managed Care – PPO | Admitting: Family Medicine

## 2021-01-12 ENCOUNTER — Ambulatory Visit: Payer: BC Managed Care – PPO | Attending: Internal Medicine

## 2021-01-12 ENCOUNTER — Other Ambulatory Visit (HOSPITAL_BASED_OUTPATIENT_CLINIC_OR_DEPARTMENT_OTHER): Payer: Self-pay

## 2021-01-12 VITALS — BP 124/84 | HR 77 | Temp 98.1°F | Ht 71.0 in | Wt 239.1 lb

## 2021-01-12 DIAGNOSIS — Z125 Encounter for screening for malignant neoplasm of prostate: Secondary | ICD-10-CM | POA: Diagnosis not present

## 2021-01-12 DIAGNOSIS — E782 Mixed hyperlipidemia: Secondary | ICD-10-CM | POA: Diagnosis not present

## 2021-01-12 DIAGNOSIS — Z0000 Encounter for general adult medical examination without abnormal findings: Secondary | ICD-10-CM | POA: Diagnosis not present

## 2021-01-12 DIAGNOSIS — Z23 Encounter for immunization: Secondary | ICD-10-CM | POA: Diagnosis not present

## 2021-01-12 DIAGNOSIS — J42 Unspecified chronic bronchitis: Secondary | ICD-10-CM | POA: Diagnosis not present

## 2021-01-12 LAB — COMPREHENSIVE METABOLIC PANEL
ALT: 19 U/L (ref 0–53)
AST: 12 U/L (ref 0–37)
Albumin: 4.2 g/dL (ref 3.5–5.2)
Alkaline Phosphatase: 65 U/L (ref 39–117)
BUN: 19 mg/dL (ref 6–23)
CO2: 27 mEq/L (ref 19–32)
Calcium: 9 mg/dL (ref 8.4–10.5)
Chloride: 105 mEq/L (ref 96–112)
Creatinine, Ser: 0.86 mg/dL (ref 0.40–1.50)
GFR: 91.47 mL/min (ref >60.00)
Glucose, Bld: 70 mg/dL (ref 70–99)
Potassium: 4.3 mEq/L (ref 3.5–5.1)
Sodium: 139 mEq/L (ref 135–145)
Total Bilirubin: 0.9 mg/dL (ref 0.2–1.2)
Total Protein: 6.5 g/dL (ref 6.0–8.3)

## 2021-01-12 LAB — CBC
HCT: 45.2 % (ref 39.0–52.0)
Hemoglobin: 15.1 g/dL (ref 13.0–17.0)
MCHC: 33.3 g/dL (ref 30.0–36.0)
MCV: 95.2 fl (ref 78.0–100.0)
Platelets: 239 10*3/uL (ref 150.0–400.0)
RBC: 4.75 Mil/uL (ref 4.22–5.81)
RDW: 13.9 % (ref 11.5–15.5)
WBC: 8.7 10*3/uL (ref 4.0–10.5)

## 2021-01-12 LAB — LIPID PANEL
Cholesterol: 147 mg/dL (ref 0–200)
HDL: 55.3 mg/dL (ref >39.00)
LDL Cholesterol: 66 mg/dL (ref 0–99)
NonHDL: 92.09
Total CHOL/HDL Ratio: 3
Triglycerides: 130 mg/dL (ref 0.0–149.0)
VLDL: 26 mg/dL (ref 0.0–40.0)

## 2021-01-12 LAB — PSA: PSA: 0.57 ng/mL (ref 0.10–4.00)

## 2021-01-12 MED ORDER — PFIZER COVID-19 VAC BIVALENT 30 MCG/0.3ML IM SUSP
INTRAMUSCULAR | 0 refills | Status: DC
  Filled 2021-01-12: qty 0.3, 1d supply, fill #0

## 2021-01-12 MED ORDER — TIZANIDINE HCL 2 MG PO TABS: 2.0000 mg | ORAL_TABLET | Freq: Four times a day (QID) | ORAL | 0 refills | Status: DC | PRN

## 2021-01-12 MED ORDER — UMECLIDINIUM BROMIDE 62.5 MCG/ACT IN AEPB: 1.0000 | INHALATION_SPRAY | Freq: Every day | RESPIRATORY_TRACT | 11 refills | Status: DC

## 2021-01-12 NOTE — Patient Instructions (Addendum)
Give [place] 2-3 business days to get the results of your labs back.   Keep the diet clean and stay active.  Take a spoonful of pickle juice before bed.   I recommend getting the updated bivalent covid vaccination booster at your convenience.   Let us know if you need anything.

## 2021-01-12 NOTE — Progress Notes (Signed)
Chief Complaint  Patient presents with   Annual Exam    Well Male Mitchell Parrish is here for a complete physical.   His last physical was >1 year ago.  Current diet: in general, diet is fair.  Current exercise: golfing Weight trend: had lost, starting to gain again Fatigue out of ordinary? No. Seat belt? Yes.   Advanced directive? No  Health maintenance Shingrix- Yes Colonoscopy- Yes Tetanus- Yes HIV- Yes Hep C- Yes Lung cancer screening- Yes  COPD Taking Incruse and Symbicort daily. Reports compliance, no AE's. Rarely uses rescue inhaler, tends to use it nightly regardless of how he is feeling. He still smokes daily. No recent pred use or hospitalizations.   Hyperlipidemia Patient presents for hyperlipidemia follow up. Currently being treated with Crestor 20 mg and compliance with treatment thus far has been good. He complains of myalgias. The patient is not known to have coexisting coronary artery disease.   Past Medical History:  Diagnosis Date   Acute diastolic CHF (congestive heart failure) ([place]) 05/29/2018   Arthritis    left hip   Atrial fibrillation (HCC)    Emphysema lung ([place])    Hyperlipidemia    Hypertension    Tubular adenoma of colon 11/2011      Past Surgical History:  Procedure Laterality Date   ATRIAL FIBRILLATION ABLATION N/A 04/03/2019   Procedure: ATRIAL FIBRILLATION ABLATION;  Surgeon: Constance Haw, MD;  Location: [facility];  Service: Cardiovascular;  Laterality: N/A;   BACK SURGERY     CARDIOVERSION N/A 06/25/2018   Procedure: CARDIOVERSION (CATH LAB);  Surgeon: Evans Lance, MD;  Location: [facility];  Service: Cardiovascular;  Laterality: N/A;   HERNIA REPAIR     SPINE SURGERY     cervical fusion c6, c7 (1994), and c3,4,5,6 (2004)   TOTAL KNEE ARTHROPLASTY Right 01/28/2019   Procedure: TOTAL KNEE ARTHROPLASTY;  Surgeon: Gaynelle Arabian, MD;  Location: [facility];  Service: Orthopedics;  Laterality: Right;   WISDOM  TOOTH EXTRACTION      Medications  Current Outpatient Medications on File Prior to Visit  Medication Sig Dispense Refill   albuterol (VENTOLIN HFA) 108 (90 Base) MCG/ACT inhaler INHALE 2 PUFFS INTO THE LUNGS EVERY 4 HOURS AS NEEDED FOR WHEEZING OR SHORTNESS OF BREATH 18 g 5   co-enzyme Q-10 30 MG capsule Take 200 mg by mouth daily.     fluticasone (FLONASE) 50 MCG/ACT nasal spray Place 2 sprays into both nostrils as needed for allergies or rhinitis. 16 g 5   furosemide (LASIX) 40 MG tablet TAKE 1 TABLET BY MOUTH DAILY AS NEEDED 30 tablet 9   INCRUSE ELLIPTA 62.5 MCG/INH AEPB INHALE 1 PUFF INTO THE LUNGS DAILY 30 each 2   metoprolol succinate (TOPROL-XL) 25 MG 24 hr tablet Take 1 tablet (25 mg total) by mouth 2 (two) times daily. 180 tablet 2   potassium chloride (KLOR-CON) 10 MEQ tablet TAKE 1 TABLET BY MOUTH DAILY AS NEEDED WITH LASIX 30 tablet 8   rosuvastatin (CRESTOR) 20 MG tablet Take 1 tablet (20 mg total) by mouth daily. 90 tablet 3   SYMBICORT 160-4.5 MCG/ACT inhaler INHALE 1 PUFF INTO THE LUNGS IN THE MORNING AND AT BEDTIME 10.2 g 5   tiZANidine (ZANAFLEX) 2 MG tablet Take 1-2 tablets (2-4 mg total) by mouth every 6 (six) hours as needed for muscle spasms. 30 tablet 0   losartan (COZAAR) 100 MG tablet Take 1 tablet (100 mg total) by mouth daily. [place]  tablet 3    Allergies Allergies  Allergen Reactions   Lodine [Etodolac] Hives    Family History Family History  Problem Relation Age of Onset   Diabetes Mother    Lung cancer Mother 27   Diabetes Father    Heart disease Father    Colon cancer Father 22       rectal cancer    Review of Systems: Constitutional:  no fevers Eye:  no recent significant change in vision Ear/Nose/Mouth/Throat:  Ears:  no hearing loss Nose/Mouth/Throat:  no complaints of nasal congestion, no sore throat Cardiovascular:  no chest pain Respiratory:  no shortness of breath Gastrointestinal:  no change in bowel habits GU:  Male: negative for  dysuria, frequency Musculoskeletal/Extremities:  +neck pain Integumentary (Skin/Breast):  no abnormal skin lesions reported Neurologic:  no headaches Endocrine: No unexpected weight changes Hematologic/Lymphatic:  no abnormal bleeding  Exam BP 124/84   Pulse 77   Temp 98.1 F (36.7 C) (Oral)   Ht 5\' 11"  (1.803 m)   Wt 239 lb 2 oz (108.5 kg)   SpO2 99%   BMI 33.35 kg/m  General:  well developed, well nourished, in no apparent distress Skin:  no significant moles, warts, or growths Head:  no masses, lesions, or tenderness Eyes:  pupils equal and round, sclera anicteric without injection Ears:  canals without lesions, TMs shiny without retraction, no obvious effusion, no erythema Nose:  nares patent, septum midline, mucosa normal Throat/Pharynx:  lips and gingiva without lesion; tongue and uvula midline; non-inflamed pharynx; no exudates or postnasal drainage Neck: neck supple without adenopathy, thyromegaly, or masses Cardiac: RRR, no bruits, no LE edema Lungs:  clear to auscultation, breath sounds equal bilaterally, no respiratory distress Abdomen: BS+, soft, non-tender, non-distended, no masses or organomegaly noted Rectal: Deferred Musculoskeletal:  symmetrical muscle groups noted without atrophy or deformity Neuro:  gait normal; deep tendon reflexes normal and symmetric Psych: well oriented with normal range of affect and appropriate judgment/insight  Assessment and Plan  Well adult exam  Chronic bronchitis, unspecified chronic bronchitis type (HCC)  Mixed hyperlipidemia  Screening for prostate cancer   Well 65 y.o. male. Counseled on diet and exercise. Counseled on risks and benefits of prostate cancer screening with PSA. The patient agrees to undergo testing. Immunizations, labs, and further orders as above. COPD: Chronic, stable.  Continue Incruse and Symbicort daily.  Stop using albuterol scheduled and start using as needed. Hyperlipidemia: Chronic, stable.   Continue Crestor 20 mg daily.  Check labs. Flu shot today, updated COVID bivalent booster recommended.  Politely declined PCV 20 today, will get next year. Follow up 1 year. The patient voiced understanding and agreement to the plan.  [place], DO 01/12/21 1:29 PM

## 2021-01-13 ENCOUNTER — Telehealth: Payer: Self-pay | Admitting: Family Medicine

## 2021-01-13 NOTE — Telephone Encounter (Signed)
PA canceled as was filled too early. [facility] pharmacy said to disregard PA.

## 2021-01-13 NOTE — Telephone Encounter (Signed)
Initiated PA for [employer] on covermymeds KEY:  [id number] Waiting for response

## 2021-02-04 ENCOUNTER — Other Ambulatory Visit: Payer: Self-pay | Admitting: Family Medicine

## 2021-03-17 ENCOUNTER — Other Ambulatory Visit (HOSPITAL_COMMUNITY): Payer: Self-pay | Admitting: Physician Assistant

## 2021-04-02 ENCOUNTER — Encounter: Payer: Self-pay | Admitting: Family Medicine

## 2021-05-24 ENCOUNTER — Other Ambulatory Visit: Payer: Self-pay | Admitting: Family Medicine

## 2021-05-24 ENCOUNTER — Encounter: Payer: Self-pay | Admitting: Family Medicine

## 2021-05-24 MED ORDER — SPIRIVA HANDIHALER 18 MCG IN CAPS: 18.0000 ug | ORAL_CAPSULE | Freq: Every day | RESPIRATORY_TRACT | 12 refills | Status: DC

## 2021-07-05 ENCOUNTER — Other Ambulatory Visit: Payer: Self-pay | Admitting: Cardiology

## 2021-07-12 ENCOUNTER — Other Ambulatory Visit: Payer: Self-pay | Admitting: Family Medicine

## 2021-07-12 DIAGNOSIS — J42 Unspecified chronic bronchitis: Secondary | ICD-10-CM

## 2021-07-31 ENCOUNTER — Other Ambulatory Visit: Payer: Self-pay | Admitting: Cardiology

## 2021-08-04 ENCOUNTER — Other Ambulatory Visit: Payer: Self-pay | Admitting: Cardiology

## 2021-08-05 ENCOUNTER — Other Ambulatory Visit: Payer: Self-pay | Admitting: Cardiology

## 2021-08-06 ENCOUNTER — Telehealth: Payer: Self-pay | Admitting: Cardiology

## 2021-08-06 MED ORDER — LOSARTAN POTASSIUM 100 MG PO TABS: 100.0000 mg | ORAL_TABLET | Freq: Every day | ORAL | 0 refills | Status: DC

## 2021-08-06 NOTE — Telephone Encounter (Signed)
Pt's medication was sent to pt's pharmacy as requested. Confirmation received.  °

## 2021-08-06 NOTE — Telephone Encounter (Signed)
*  STAT* If patient is at the pharmacy, call can be transferred to refill team.   1. Which medications need to be refilled? (please list name of each medication and dose if known) losartan (COZAAR) 100 MG tablet  2. Which pharmacy/location (including street and city if local pharmacy) is medication to be sent to? [facility], [place]  3. Do they need a 30 day or 90 day supply?90 day   Patient has appt scheduled for 10/25/21

## 2021-09-23 ENCOUNTER — Other Ambulatory Visit: Payer: Self-pay | Admitting: Cardiology

## 2021-10-21 ENCOUNTER — Other Ambulatory Visit: Payer: Self-pay | Admitting: Cardiology

## 2021-10-25 ENCOUNTER — Ambulatory Visit: Payer: PPO | Attending: Cardiology | Admitting: Cardiology

## 2021-10-25 ENCOUNTER — Encounter: Payer: Self-pay | Admitting: Cardiology

## 2021-10-25 VITALS — BP 112/68 | HR 74 | Ht 71.0 in | Wt 252.6 lb

## 2021-10-25 DIAGNOSIS — I4819 Other persistent atrial fibrillation: Secondary | ICD-10-CM | POA: Diagnosis not present

## 2021-10-25 DIAGNOSIS — I10 Essential (primary) hypertension: Secondary | ICD-10-CM | POA: Diagnosis not present

## 2021-10-25 MED ORDER — METOPROLOL SUCCINATE ER 25 MG PO TB24: 25.0000 mg | ORAL_TABLET | Freq: Two times a day (BID) | ORAL | 3 refills | Status: DC

## 2021-10-25 MED ORDER — LOSARTAN POTASSIUM 100 MG PO TABS: 100.0000 mg | ORAL_TABLET | Freq: Every day | ORAL | 3 refills | Status: DC

## 2021-10-25 NOTE — Progress Notes (Signed)
Electrophysiology Office Note   Date:  10/25/2021   ID:  Mitchell Parrish, DOB 27-Sep-1955, MRN [id number]  PCP:  [name], DO  Cardiologist:  Lovena Le Primary Electrophysiologist: Gaye Alken, MD    Chief Complaint: AF   History of Present Illness: Mitchell Parrish is a 66 y.o. male who is being seen today for the evaluation of AF at the request of [name]*. Presenting today for electrophysiology evaluation.  He has a history of persistent atrial fibrillation, diastolic heart failure, hypertension, hyperlipidemia.  He was previously on amiodarone.  He developed a tachycardia mediated cardiomyopathy.  He is status post atrial fibrillation ablation 04/26/2019.  His ejection fraction has since normalized.  Today, denies symptoms of palpitations, chest pain, shortness of breath, orthopnea, PND, lower extremity edema, claudication, dizziness, presyncope, syncope, bleeding, or neurologic sequela. The patient is tolerating medications without difficulties.  Today he is feeling well.  He has had no episodes of atrial fibrillation.  He states that his heart rate is fast at times.  He uses cardia mobile monitor that shows sinus rhythm.  He is overall happy with his control.  Past Medical History:  Diagnosis Date   Acute diastolic CHF (congestive heart failure) ([place] [place] [place]) 05/29/2018   Arthritis    left hip   Atrial fibrillation (HCC)    Emphysema lung ([place])    Hyperlipidemia    Hypertension    Tubular adenoma of colon 11/2011   Past Surgical History:  Procedure Laterality Date   ATRIAL FIBRILLATION ABLATION N/A 04/03/2019   Procedure: ATRIAL FIBRILLATION ABLATION;  Surgeon: Constance Haw, MD;  Location: [facility];  Service: Cardiovascular;  Laterality: N/A;   BACK SURGERY     CARDIOVERSION N/A 06/25/2018   Procedure: CARDIOVERSION (CATH LAB);  Surgeon: Evans Lance, MD;  Location: [facility];  Service: Cardiovascular;   Laterality: N/A;   HERNIA REPAIR     SPINE SURGERY     cervical fusion c6, c7 (1994), and c3,4,5,6 (2004)   TOTAL KNEE ARTHROPLASTY Right 01/28/2019   Procedure: TOTAL KNEE ARTHROPLASTY;  Surgeon: Gaynelle Arabian, MD;  [facility];  Service: Orthopedics;  Laterality: Right;   WISDOM TOOTH EXTRACTION       Current Outpatient Medications  Medication Sig Dispense Refill   albuterol (VENTOLIN HFA) 108 (90 Base) MCG/ACT inhaler INHALE 2 PUFFS INTO THE LUNGS EVERY 4 HOURS AS NEEDED FOR WHEEZING OR SHORTNESS OF BREATH 8.5 g 5   co-enzyme Q-10 30 MG capsule Take 200 mg by mouth daily.     COVID-19 mRNA bivalent vaccine, Pfizer, (PFIZER COVID-19 VAC BIVALENT) injection Inject into the muscle. 0.3 mL 0   fluticasone (FLONASE) 50 MCG/ACT nasal spray Place 2 sprays into both nostrils as needed for allergies or rhinitis. 16 g 5   furosemide (LASIX) 40 MG tablet TAKE 1 TABLET BY MOUTH DAILY AS NEEDED 30 tablet 0   losartan (COZAAR) 100 MG tablet Take 1 tablet (100 mg total) by mouth daily. 90 tablet 0   metoprolol succinate (TOPROL-XL) 25 MG 24 hr tablet Take 1 tablet (25 mg total) by mouth 2 (two) times daily. Please keep September appointment for future refills. Thank you. 60 tablet 0   potassium chloride (KLOR-CON) 10 MEQ tablet TAKE 1 TABLET BY MOUTH DAILY AS NEEDED WITH LASIX 30 tablet 0   SYMBICORT 160-4.5 MCG/ACT inhaler INHALE 1 PUFF INTO THE LUNGS IN THE MORNING AND AT BEDTIME 10.2 g 5   tiotropium (SPIRIVA HANDIHALER)  18 MCG inhalation capsule Place 1 capsule (18 mcg total) into inhaler and inhale daily. 30 capsule 12   tiZANidine (ZANAFLEX) 2 MG tablet Take 1-2 tablets (2-4 mg total) by mouth every 6 (six) hours as needed for muscle spasms. 30 tablet 0   No current facility-administered medications for this visit.    Allergies:   Lodine [etodolac]   Social History:  The patient  reports that he has been smoking cigarettes. He has a 22.50 pack-year smoking history. He has never used  smokeless tobacco. He reports current alcohol use of about 1.0 - 2.0 standard drink of alcohol per week. He reports that he does not use drugs.   Family History:  The patient's family history includes Colon cancer (age of onset: 2) in his father; Diabetes in his father and mother; Heart disease in his father; Lung cancer (age of onset: 1) in his mother.   ROS:  Please see the history of present illness.   Otherwise, review of systems is positive for none.   All other systems are reviewed and negative.   PHYSICAL EXAM: VS:  BP 112/68   Pulse 74   Ht '5\' 11"'$  (1.803 m)   Wt 252 lb 9.6 oz (114.6 kg)   SpO2 96%   BMI 35.23 kg/m  , BMI Body mass index is 35.23 kg/m. GEN: Well nourished, well developed, in no acute distress  HEENT: normal  Neck: no JVD, carotid bruits, or masses Cardiac: RRR; no murmurs, rubs, or gallops,no edema  Respiratory:  clear to auscultation bilaterally, normal work of breathing GI: soft, nontender, nondistended, + BS MS: no deformity or atrophy  Skin: warm and dry Neuro:  Strength and sensation are intact Psych: euthymic mood, full affect  EKG:  EKG is ordered today. Personal review of the ekg ordered shows sinus rhythm, rate 74  Recent Labs: 01/12/2021: ALT 19; BUN 19; Creatinine, Ser 0.86; Hemoglobin 15.1; Platelets 239.0; Potassium 4.3; Sodium 139    Lipid Panel     Component Value Date/Time   CHOL 147 01/12/2021 1350   TRIG 130.0 01/12/2021 1350   HDL 55.30 01/12/2021 1350   CHOLHDL 3 01/12/2021 1350   VLDL 26.0 01/12/2021 1350   LDLCALC 66 01/12/2021 1350     Wt Readings from Last 3 Encounters:  10/25/21 252 lb 9.6 oz (114.6 kg)  01/12/21 239 lb 2 oz (108.5 kg)  08/28/20 222 lb (100.7 kg)      Other studies Reviewed: Additional studies/ records that were reviewed today include: TTE 01/01/19  Review of the above records today demonstrates:   1. Left ventricular ejection fraction, by visual estimation, is 60 to 65%. The left ventricle has  normal function. There is no left ventricular hypertrophy.  2. The left ventricle has no regional wall motion abnormalities.  3. Left ventricular diastolic parameters are consistent with Grade II diastolic dysfunction (pseudonormalization).  4. Global right ventricle has normal systolic function.The right ventricular size is normal. No increase in right ventricular wall thickness.  5. Left atrial size was normal.  6. Right atrial size was normal.  7. The mitral valve is normal in structure. Trace mitral valve regurgitation. No evidence of mitral stenosis.  8. The tricuspid valve is normal in structure. Tricuspid valve regurgitation is trivial.  9. The aortic valve is tricuspid. Aortic valve regurgitation is not visualized. No evidence of aortic valve sclerosis or stenosis. 10. There is mild dilatation of the aortic root measuring 38 mm. 11. The inferior vena cava is normal in  size with greater than 50% respiratory variability, suggesting right atrial pressure of 3 mmHg. 12. The tricuspid regurgitant velocity is 2.51 m/s, and with an assumed right atrial pressure of 3 mmHg, the estimated right ventricular systolic pressure is normal at 28.2 mmHg.   ASSESSMENT AND PLAN:  1.  Persistent atrial fibrillation: CHA2DS2-VASc of 1.  Status post ablation 04/03/2019.  Currently on metoprolol.  No further episodes.  No changes.  2.  Hypertension: Currently well controlled.  Continue losartan 100 mg daily.  3.  Chronic systolic heart failure: Due to a tachycardia mediated cardiomyopathy.  Ejection fraction is normalized.  4.  Obstructive sleep apnea: CPAP compliance encouraged   Current medicines are reviewed at length with the patient today.   The patient does not have concerns regarding his medicines.  The following changes were made today: None  Labs/ tests ordered today include:  Orders Placed This Encounter  Procedures   EKG 12-Lead     Disposition:   FU 12 months  Signed, Latecia Miler Meredith Leeds, MD  10/25/2021 10:45 AM     [facility] [address] [place] [address] [phone] (office) [phone] (fax)

## 2021-10-25 NOTE — Addendum Note (Signed)
Addended by: Stanton Kidney on: 10/25/2021 10:46 AM   Modules accepted: Orders

## 2021-10-25 NOTE — Addendum Note (Signed)
Addended by: Stanton Kidney on: 10/25/2021 10:47 AM   Modules accepted: Orders

## 2021-10-29 ENCOUNTER — Other Ambulatory Visit: Payer: Self-pay | Admitting: Cardiology

## 2021-10-29 NOTE — Telephone Encounter (Signed)
Outpatient Medication Detail   Disp Refills Start End   losartan (COZAAR) 100 MG tablet 90 tablet 3 10/25/2021    Sig - Route: Take 1 tablet (100 mg total) by mouth daily. - Oral   Sent to pharmacy as: losartan (COZAAR) 100 MG tablet   E-Prescribing Status: Receipt confirmed by pharmacy (10/25/2021 11:01 AM EDT)    Pharmacy  [facility], [place]

## 2021-11-23 ENCOUNTER — Other Ambulatory Visit: Payer: Self-pay | Admitting: Cardiology

## 2021-11-29 ENCOUNTER — Other Ambulatory Visit: Payer: Self-pay

## 2021-11-29 MED ORDER — POTASSIUM CHLORIDE ER 10 MEQ PO TBCR: EXTENDED_RELEASE_TABLET | ORAL | 11 refills | Status: DC

## 2021-11-29 MED ORDER — FUROSEMIDE 40 MG PO TABS: 40.0000 mg | ORAL_TABLET | Freq: Every day | ORAL | 11 refills | Status: DC | PRN

## 2022-01-24 ENCOUNTER — Other Ambulatory Visit: Payer: Self-pay | Admitting: Family Medicine

## 2022-01-24 DIAGNOSIS — J42 Unspecified chronic bronchitis: Secondary | ICD-10-CM

## 2022-02-22 ENCOUNTER — Encounter: Payer: Self-pay | Admitting: Gastroenterology

## 2022-03-21 ENCOUNTER — Emergency Department (HOSPITAL_COMMUNITY): Payer: PPO

## 2022-03-21 ENCOUNTER — Emergency Department (HOSPITAL_COMMUNITY)
Admission: EM | Admit: 2022-03-21 | Discharge: 2022-03-21 | Disposition: A | Discharge status: Home / Self Care | Payer: PPO | Attending: Emergency Medicine | Admitting: Emergency Medicine

## 2022-03-21 ENCOUNTER — Encounter (HOSPITAL_COMMUNITY): Payer: Self-pay | Admitting: Emergency Medicine

## 2022-03-21 ENCOUNTER — Other Ambulatory Visit: Payer: Self-pay

## 2022-03-21 DIAGNOSIS — K8020 Calculus of gallbladder without cholecystitis without obstruction: Secondary | ICD-10-CM | POA: Diagnosis not present

## 2022-03-21 DIAGNOSIS — I10 Essential (primary) hypertension: Secondary | ICD-10-CM | POA: Diagnosis not present

## 2022-03-21 DIAGNOSIS — R1084 Generalized abdominal pain: Secondary | ICD-10-CM

## 2022-03-21 DIAGNOSIS — K760 Fatty (change of) liver, not elsewhere classified: Secondary | ICD-10-CM | POA: Diagnosis not present

## 2022-03-21 LAB — COMPREHENSIVE METABOLIC PANEL
ALT: 27 U/L (ref 0–44)
AST: 18 U/L (ref 15–41)
Albumin: 4.1 g/dL (ref 3.5–5.0)
Alkaline Phosphatase: 73 U/L (ref 38–126)
Anion gap: 13 (ref 5–15)
BUN: 9 mg/dL (ref 8–23)
CO2: 21 mmol/L — ABNORMAL LOW (ref 22–32)
Calcium: 9.2 mg/dL (ref 8.9–10.3)
Chloride: 100 mmol/L (ref 98–111)
Creatinine, Ser: 0.8 mg/dL (ref 0.61–1.24)
GFR, Estimated: >60 mL/min (ref >60)
Glucose, Bld: 141 mg/dL — ABNORMAL HIGH (ref 70–99)
Potassium: 4 mmol/L (ref 3.5–5.1)
Sodium: 134 mmol/L — ABNORMAL LOW (ref 135–145)
Total Bilirubin: 0.7 mg/dL (ref 0.3–1.2)
Total Protein: 7.3 g/dL (ref 6.5–8.1)

## 2022-03-21 LAB — URINALYSIS, ROUTINE W REFLEX MICROSCOPIC
Bilirubin Urine: NEGATIVE
Glucose, UA: NEGATIVE mg/dL
Hgb urine dipstick: NEGATIVE
Ketones, ur: NEGATIVE mg/dL
Leukocytes,Ua: NEGATIVE
Nitrite: NEGATIVE
Protein, ur: NEGATIVE mg/dL
Specific Gravity, Urine: 1.014 (ref 1.005–1.030)
pH: 5 (ref 5.0–8.0)

## 2022-03-21 LAB — CBC
HCT: 51.6 % (ref 39.0–52.0)
Hemoglobin: 18 g/dL — ABNORMAL HIGH (ref 13.0–17.0)
MCH: 33 pg (ref 26.0–34.0)
MCHC: 34.9 g/dL (ref 30.0–36.0)
MCV: 94.5 fL (ref 80.0–100.0)
Platelets: 205 10*3/uL (ref 150–400)
RBC: 5.46 MIL/uL (ref 4.22–5.81)
RDW: 13.4 % (ref 11.5–15.5)
WBC: 9.1 10*3/uL (ref 4.0–10.5)
nRBC: 0 % (ref 0.0–0.2)

## 2022-03-21 LAB — TROPONIN I (HIGH SENSITIVITY): Troponin I (High Sensitivity): 5 ng/L (ref <18)

## 2022-03-21 LAB — LIPASE, BLOOD: Lipase: 36 U/L (ref 11–51)

## 2022-03-21 MED ORDER — DICYCLOMINE HCL 20 MG PO TABS: 20.0000 mg | ORAL_TABLET | Freq: Two times a day (BID) | ORAL | 0 refills | Status: DC

## 2022-03-21 MED ORDER — SODIUM CHLORIDE 0.9 % IV BOLUS
1000.0000 mL | Freq: Once | INTRAVENOUS | Status: AC
  Administered 2022-03-21: 1000 mL via INTRAVENOUS

## 2022-03-21 MED ORDER — ONDANSETRON HCL 4 MG/2ML IJ SOLN
4.0000 mg | Freq: Once | INTRAMUSCULAR | Status: AC
  Administered 2022-03-21: 4 mg via INTRAVENOUS
  Filled 2022-03-21: qty 2

## 2022-03-21 MED ORDER — IOHEXOL 350 MG/ML SOLN
75.0000 mL | Freq: Once | INTRAVENOUS | Status: AC | PRN
  Administered 2022-03-21: 75 mL via INTRAVENOUS

## 2022-03-21 MED ORDER — DICYCLOMINE HCL 10 MG/ML IM SOLN
20.0000 mg | Freq: Once | INTRAMUSCULAR | Status: AC
  Administered 2022-03-21: 20 mg via INTRAMUSCULAR
  Filled 2022-03-21: qty 2

## 2022-03-21 MED ORDER — ALUM & MAG HYDROXIDE-SIMETH 200-200-20 MG/5ML PO SUSP
30.0000 mL | Freq: Once | ORAL | Status: AC
  Administered 2022-03-21: 30 mL via ORAL
  Filled 2022-03-21: qty 30

## 2022-03-21 MED ORDER — FAMOTIDINE IN NACL 20-0.9 MG/50ML-% IV SOLN
20.0000 mg | Freq: Once | INTRAVENOUS | Status: AC
  Administered 2022-03-21: 20 mg via INTRAVENOUS
  Filled 2022-03-21: qty 50

## 2022-03-21 MED ORDER — MORPHINE SULFATE (PF) 4 MG/ML IV SOLN
4.0000 mg | Freq: Once | INTRAVENOUS | Status: AC
  Administered 2022-03-21: 4 mg via INTRAVENOUS
  Filled 2022-03-21: qty 1

## 2022-03-21 NOTE — ED Triage Notes (Signed)
Pt reports upper abdominal pain that started about 7:30pm last night.  Pt states he has not been able to get comfortable experiencing 8/10 cramping pain.

## 2022-03-21 NOTE — ED Provider Notes (Signed)
Patient care assumed at shift handoff from [employer], [place].  Please see their note for full details  68 year old male patient presented to the emergency department with chief complaint of generalized abdominal pain which began at approximately 730 last night.  Pain was described as diffuse, gas, bloating.  Patient denied vomiting, diarrhea.  He denied prior abdominal surgeries.   Physical Exam  BP (!) 168/91   Pulse 83   Temp 98 F (36.7 C) (Oral)   Resp 16   Ht 5' 10.5" (1.791 m)   Wt 108.9 kg   SpO2 94%   BMI 33.95 kg/m   Physical Exam  Procedures  Procedures  ED Course / MDM    Medical Decision Making Amount and/or Complexity of Data Reviewed Labs: ordered. Radiology: ordered.  Risk OTC drugs. Prescription drug management.   I ordered and interpreted imaging including a CT abdomen pelvis which showed no acute cause for the patient's pain. 1. No acute findings in the abdomen or pelvis.  2. Cholelithiasis without evidence of acute cholecystitis.  3. Mild hepatic steatosis with stable hepatic cysts.  4. Stable 2.5 cm right adrenal adenoma.  5. Tiny renal cortical hypodensities which are too small to  characterize.  6. Aortic atherosclerosis.  7. Osteopenia and degenerative change with acquired spinal stenosis  L4-5, acquired foraminal stenosis L4-5 and L5-S1.    Aortic Atherosclerosis  I agree with radiologist findings  I ordered the patient a GI cocktail and Bentyl after the patient had already been given Pepcid and 2 doses of morphine.  The Bentyl seem to provide the most relief.  The patient seems much more comfortable at this time  I did order a troponin which was 5, combined with a normal ECG very low clinical suspicion at this time of ACS.  Patient's symptoms are consistent with a gastritis.  Plan to discharge patient home with prescription for Bentyl and recommendations for follow-up with his primary care provider.      Ronny Bacon 03/21/22 [id number]    Gareth Morgan, MD 03/22/22 2317

## 2022-03-21 NOTE — ED Provider Notes (Signed)
  [facility] Emergency Department Provider Note MRN:  [id number]  Arrival date & time: 03/21/22     Chief Complaint   Abdominal Pain   History of Present Illness   Mitchell Parrish is a 67 y.o. year-old male presents to the ED with chief complaint of abdominal pain.  Onset about 8 hours ago.  States the pain is diffuse.  Denies vomiting or diarrhea.  Denies prior abdominal surgeries.  States he feels constipated.  Rates pain 8/10.  History provided by patient.   Review of Systems  Pertinent positive and negative review of systems noted in HPI.    Physical Exam   Vitals:   03/21/22 0335 03/21/22 0415  BP: (!) 164/90 (!) 172/88  Pulse: 83 79  Resp: 20 18  Temp: 98.2 F (36.8 C)   SpO2: 98% 95%    CONSTITUTIONAL:  uncomfortable-appearing, NAD NEURO:  Alert and oriented x 3, CN 3-12 grossly intact EYES:  eyes equal and reactive ENT/NECK:  Supple, no stridor  CARDIO:  normal rate, appears well-perfused  PULM:  No respiratory distress,  GI/GU:  non-distended, generalized abdominal discomfort MSK/SPINE:  No gross deformities, no edema, moves all extremities  SKIN:  no rash, atraumatic   *Additional and/or pertinent findings included in MDM below  Diagnostic and Interventional Summary    EKG Interpretation  Date/Time:    Ventricular Rate:    PR Interval:    QRS Duration:   QT Interval:    QTC Calculation:   R Axis:     Text Interpretation:         Labs Reviewed  CBC - Abnormal; Notable for the following components:      Result Value   Hemoglobin 18.0 (*)    All other components within normal limits  URINALYSIS, ROUTINE W REFLEX MICROSCOPIC  LIPASE, BLOOD  COMPREHENSIVE METABOLIC PANEL    No orders to display    Medications - No data to display   Procedures  /  Critical Care Procedures  ED Course and Medical Decision Making  I have reviewed the triage vital signs, the nursing notes, and pertinent available records from the  EMR.  Social Determinants Affecting Complexity of Care: Patient has no clinically significant social determinants affecting this chief complaint..   ED Course:    Medical Decision Making Patient with generalized abdominal pain.  No vomiting or diarrhea.  Onset about 8 hours ago during the [employer] show.  Had been eating wings and sliders.    Generalized abdominal discomfort.  Will treat symptoms, check labs and imaging.  Amount and/or Complexity of Data Reviewed Labs: ordered.    Details: No leukocytosis Normal electrolytes Normal lipase Radiology: ordered.    Details: CT pending at shift change  Risk Prescription drug management.     Consultants: N/a   Treatment and Plan: Patient signed out to oncoming team at shift change.  Pending CT.    Final Clinical Impressions(s) / ED Diagnoses  No diagnosis found.  ED Discharge Orders     None         Discharge Instructions Discussed with and Provided to Patient:   Discharge Instructions   None      Arshia, [place], PA-C 03/21/22 [id number]    Orpah Greek, MD 03/22/22 [phone]

## 2022-03-21 NOTE — Discharge Instructions (Addendum)
You were evaluated today for abdominal pain.  Your symptoms are consistent with inflammation.  I have prescribed a medication called Bentyl.  Please take as directed.  I recommend following up with primary care for further evaluation and management.  If you develop any life-threatening symptoms such as chest pain, shortness of breath, intractable nausea, please return to the emergency department

## 2022-04-01 ENCOUNTER — Telehealth: Payer: Self-pay

## 2022-04-01 NOTE — Telephone Encounter (Signed)
PA approved.   23-FEB-24:31-DEC-24 Symbicort 160-4.5MCG/ACT IN AERO Quantity:10;

## 2022-04-01 NOTE — Telephone Encounter (Signed)
Called left detailed message to patient regarding results.

## 2022-04-01 NOTE — Telephone Encounter (Signed)
PA initiated via Covermymeds; KEY: [id number]. Awaiting determination.

## 2022-05-03 ENCOUNTER — Emergency Department (HOSPITAL_COMMUNITY): Payer: PPO

## 2022-05-03 ENCOUNTER — Emergency Department (HOSPITAL_COMMUNITY)
Admission: EM | Admit: 2022-05-03 | Discharge: 2022-05-03 | Disposition: A | Discharge status: Home / Self Care | Payer: PPO | Attending: Emergency Medicine | Admitting: Emergency Medicine

## 2022-05-03 ENCOUNTER — Ambulatory Visit: Admission: EM | Admit: 2022-05-03 | Payer: PPO | Source: Home / Self Care

## 2022-05-03 ENCOUNTER — Encounter (HOSPITAL_COMMUNITY): Payer: Self-pay | Admitting: Emergency Medicine

## 2022-05-03 ENCOUNTER — Other Ambulatory Visit: Payer: Self-pay

## 2022-05-03 DIAGNOSIS — S81811A Laceration without foreign body, right lower leg, initial encounter: Secondary | ICD-10-CM

## 2022-05-03 DIAGNOSIS — S8991XA Unspecified injury of right lower leg, initial encounter: Secondary | ICD-10-CM | POA: Diagnosis present

## 2022-05-03 DIAGNOSIS — W208XXA Other cause of strike by thrown, projected or falling object, initial encounter: Secondary | ICD-10-CM | POA: Diagnosis not present

## 2022-05-03 DIAGNOSIS — Z96651 Presence of right artificial knee joint: Secondary | ICD-10-CM | POA: Diagnosis not present

## 2022-05-03 MED ORDER — MIDAZOLAM HCL 2 MG/2ML IJ SOLN
1.0000 mg | Freq: Once | INTRAMUSCULAR | Status: AC
  Administered 2022-05-03: 1 mg via INTRAVENOUS
  Filled 2022-05-03: qty 2

## 2022-05-03 MED ORDER — LIDOCAINE-EPINEPHRINE (PF) 2 %-1:200000 IJ SOLN
10.0000 mL | Freq: Once | INTRAMUSCULAR | Status: AC
  Administered 2022-05-03: 10 mL via INTRADERMAL
  Filled 2022-05-03: qty 20

## 2022-05-03 MED ORDER — BACITRACIN ZINC 500 UNIT/GM EX OINT: TOPICAL_OINTMENT | Freq: Two times a day (BID) | CUTANEOUS | Status: DC

## 2022-05-03 MED ORDER — CEPHALEXIN 500 MG PO CAPS: ORAL_CAPSULE | ORAL | 0 refills | Status: DC

## 2022-05-03 MED ORDER — ONDANSETRON HCL 4 MG/2ML IJ SOLN
4.0000 mg | Freq: Once | INTRAMUSCULAR | Status: AC
  Administered 2022-05-03: 4 mg via INTRAVENOUS
  Filled 2022-05-03: qty 2

## 2022-05-03 MED ORDER — BACITRACIN ZINC 500 UNIT/GM EX OINT
TOPICAL_OINTMENT | Freq: Once | CUTANEOUS | Status: AC
  Administered 2022-05-03: 1 via TOPICAL
  Filled 2022-05-03: qty 0.9

## 2022-05-03 MED ORDER — CEFAZOLIN SODIUM-DEXTROSE 1-4 GM/50ML-% IV SOLN
1.0000 g | Freq: Once | INTRAVENOUS | Status: AC
  Administered 2022-05-03: 1 g via INTRAVENOUS
  Filled 2022-05-03: qty 50

## 2022-05-03 MED ORDER — MORPHINE SULFATE (PF) 4 MG/ML IV SOLN
4.0000 mg | Freq: Once | INTRAVENOUS | Status: AC
  Administered 2022-05-03: 4 mg via INTRAVENOUS
  Filled 2022-05-03: qty 1

## 2022-05-03 NOTE — ED Triage Notes (Addendum)
Pt presents with avulsion to right lower leg from tree branch that fell on him. Bleeding controled on arrival. Sensation and motor function intact.

## 2022-05-03 NOTE — ED Notes (Addendum)
Pt with large avulsion to the right lower leg from tree limb; bleeding controlled at present; wrapped wound and family took pt to ED for further eval and management

## 2022-05-03 NOTE — ED Provider Notes (Signed)
[facility] Provider Note   CSN: [id number] Arrival date & time: 05/03/22  1745     History  Chief Complaint  Patient presents with   Leg Injury    Mitchell Parrish is a 67 y.o. male.  67 yo M with a chief complaints of a injury to his right shin.  The patient states that he was riding a lawnmower and ran into a branch that had partially broken on a tree.  He tried to move it out of the way and in doing so it broke off and went into his leg.  He does not think that anything stuck in the leg.  He went to urgent care and they were concerned about the extent of the wound and sent him here for evaluation.  He has been able to ambulate on it without issue.        Home Medications Prior to Admission medications   Medication Sig Start Date End Date Taking? Authorizing Provider  cephALEXin (KEFLEX) 500 MG capsule 2 caps po bid x 7 days 05/03/22  Yes Deno Etienne, DO  albuterol (VENTOLIN HFA) 108 (90 Base) MCG/ACT inhaler INHALE 2 PUFFS INTO THE LUNGS EVERY 4 HOURS AS NEEDED FOR WHEEZING OR SHORTNESS OF BREATH 01/25/22   Wendling, Crosby Oyster, DO  co-enzyme Q-10 30 MG capsule Take 200 mg by mouth daily.    [provider]  COVID-19 mRNA bivalent vaccine, Pfizer, (PFIZER COVID-19 VAC BIVALENT) injection Inject into the muscle. 01/12/21   Carlyle Basques, MD  dicyclomine (BENTYL) 20 MG tablet Take 1 tablet (20 mg total) by mouth 2 (two) times daily. 03/21/22   Dorothyann Peng, PA-C  fluticasone (FLONASE) 50 MCG/ACT nasal spray Place 2 sprays into both nostrils as needed for allergies or rhinitis. 12/14/18   [name], DO  furosemide (LASIX) 40 MG tablet Take 1 tablet (40 mg total) by mouth daily as needed. 11/29/21   Camnitz, Will Hassell Done, MD  INCRUSE ELLIPTA 62.5 MCG/ACT AEPB INHALE 1 PUFF INTO THE LUNGS DAILY 01/25/22   [name], DO  losartan (COZAAR) 100 MG tablet Take 1 tablet (100 mg total) by mouth daily.  10/25/21   Camnitz, Ocie Doyne, MD  metoprolol succinate (TOPROL-XL) 25 MG 24 hr tablet Take 1 tablet (25 mg total) by mouth 2 (two) times daily. Please keep upcoming appt.in [place] provider in order to receive future refills. Thank You. 11/24/21   Camnitz, Ocie Doyne, MD  potassium chloride (KLOR-CON) 10 MEQ tablet TAKE 1 TABLET BY MOUTH DAILY AS NEEDED WITH LASIX 11/29/21   Camnitz, Ocie Doyne, MD  SYMBICORT 160-4.5 MCG/ACT inhaler INHALE 1 PUFF INTO THE LUNGS IN THE MORNING AND AT BEDTIME 01/25/22   Wendling, Crosby Oyster, DO  tiotropium (SPIRIVA HANDIHALER) 18 MCG inhalation capsule Place 1 capsule (18 mcg total) into inhaler and inhale daily. 05/24/21   [name], DO  tiZANidine (ZANAFLEX) 2 MG tablet Take 1-2 tablets (2-4 mg total) by mouth every 6 (six) hours as needed for muscle spasms. 01/12/21   [name], DO      Allergies    Lodine [etodolac]    Review of Systems   Review of Systems  Physical Exam Updated Vital Signs BP (!) 149/74 (BP Location: Right Arm)   Pulse 74   Temp 98 F (36.7 C) (Oral)   Resp 16   Ht 5\' 11"  (1.803 m)   Wt 108.9 kg   SpO2 96%   BMI  33.47 kg/m  Physical Exam Vitals and nursing note reviewed.  Constitutional:      Appearance: He is well-developed.  HENT:     Head: Normocephalic and atraumatic.  Eyes:     Pupils: Pupils are equal, round, and reactive to light.  Neck:     Vascular: No JVD.  Cardiovascular:     Rate and Rhythm: Normal rate and regular rhythm.     Heart sounds: No murmur heard.    No friction rub. No gallop.  Pulmonary:     Effort: No respiratory distress.     Breath sounds: No wheezing.  Abdominal:     General: There is no distension.     Tenderness: There is no abdominal tenderness. There is no guarding or rebound.  Musculoskeletal:        General: Normal range of motion.     Cervical back: Normal range of motion and neck supple.  Skin:    Coloration: Skin is not pale.     Findings: No  rash.  Neurological:     Mental Status: He is alert and oriented to person, place, and time.  Psychiatric:        Behavior: Behavior normal.     ED Results / Procedures / Treatments   Labs (all labs ordered are listed, but only abnormal results are displayed) Labs Reviewed - No data to display  EKG None  Radiology DG Tibia/Fibula Right  Result Date: 05/03/2022 CLINICAL DATA:  Wound.  Laceration to the medial right tib fib area. EXAM: RIGHT TIBIA AND FIBULA - 2 VIEW COMPARISON:  Right knee 11/21/2016 FINDINGS: Right total knee arthroplasty with patellar femoral component. Components appear well seated. No evidence of acute fracture or dislocation involving the tibia or fibula. No radiopaque soft tissue foreign bodies or soft tissue gas collections. IMPRESSION: Right knee arthroplasty. No acute bony abnormalities. No soft tissue foreign bodies. Electronically Signed   By: Lucienne Capers M.D.   On: 05/03/2022 19:11    Procedures .Marland KitchenLaceration Repair  Date/Time: 05/03/2022 8:39 PM  Performed by: Deno Etienne, DO Authorized by: Deno Etienne, DO   Consent:    Consent obtained:  Verbal   Consent given by:  Patient   Risks, benefits, and alternatives were discussed: yes     Risks discussed:  Infection, pain, poor cosmetic result and poor wound healing   Alternatives discussed:  No treatment Universal protocol:    Procedure explained and questions answered to patient or proxy's satisfaction: yes     Immediately prior to procedure, a time out was called: yes     Patient identity confirmed:  Verbally with patient Anesthesia:    Anesthesia method:  Local infiltration   Local anesthetic:  Lidocaine 2% WITH epi Laceration details:    Location:  Leg   Leg location:  R lower leg   Length (cm):  32 Pre-procedure details:    Preparation:  Patient was prepped and draped in usual sterile fashion Exploration:    Hemostasis achieved with:  Epinephrine and direct pressure   Wound extent: no  underlying fracture     Contaminated: yes   Treatment:    Area cleansed with:  Chlorhexidine   Amount of cleaning:  Standard   Irrigation solution:  Sterile saline   Irrigation volume:  3L   Irrigation method:  Pressure wash   Visualized foreign bodies/material removed: yes     Debridement:  None   Undermining:  None   Scar revision: no   Skin repair:  Repair method:  Sutures   Suture size:  3-0   Suture material:  Nylon   Suture technique:  Simple interrupted   Number of sutures:  21 Approximation:    Approximation:  Close Repair type:    Repair type:  Intermediate Post-procedure details:    Dressing:  Antibiotic ointment and adhesive bandage   Procedure completion:  Tolerated well, no immediate complications     Medications Ordered in ED Medications  bacitracin ointment (has no administration in time range)  ceFAZolin (ANCEF) IVPB 1 g/50 mL premix (0 g Intravenous Stopped 05/03/22 1923)  lidocaine-EPINEPHrine (XYLOCAINE W/EPI) 2 %-1:200000 (PF) injection 10 mL (10 mLs Intradermal Given by Other 05/03/22 1948)  morphine (PF) 4 MG/ML injection 4 mg (4 mg Intravenous Given 05/03/22 1943)  ondansetron (ZOFRAN) injection 4 mg (4 mg Intravenous Given 05/03/22 1939)  midazolam (VERSED) injection 1 mg (1 mg Intravenous Given 05/03/22 1948)    ED Course/ Medical Decision Making/ A&P                             Medical Decision Making Amount and/or Complexity of Data Reviewed Radiology: ordered.  Risk OTC drugs. Prescription drug management.   67 yo M with a chief complaints of right leg pain.  He unfortunately had a penetrating injury with a tree branch.  He has a likely avulsion of skin that was adjacent to the tibia.  I am not sure that I will be able to fully close this at bedside.  Plain film independently interpreted by me without fracture or obvious foreign body.  Will discuss with Ortho.   Discussed the case with Dr. Wynelle Link, recommended repair as best they can and  wet-to-dry the rest and we will see him in the office.  Recommend starting antibiotics as well.  8:40 PM:  I have discussed the diagnosis/risks/treatment options with the patient and family.  Evaluation and diagnostic testing in the emergency department does not suggest an emergent condition requiring admission or immediate intervention beyond what has been performed at this time.  They will follow up with Ortho. We also discussed returning to the ED immediately if new or worsening sx occur. We discussed the sx which are most concerning (e.g., sudden worsening pain, fever, inability to tolerate by mouth, redness, drainage) that necessitate immediate return. Medications administered to the patient during their visit and any new prescriptions provided to the patient are listed below.  Medications given during this visit Medications  bacitracin ointment (has no administration in time range)  ceFAZolin (ANCEF) IVPB 1 g/50 mL premix (0 g Intravenous Stopped 05/03/22 1923)  lidocaine-EPINEPHrine (XYLOCAINE W/EPI) 2 %-1:200000 (PF) injection 10 mL (10 mLs Intradermal Given by Other 05/03/22 1948)  morphine (PF) 4 MG/ML injection 4 mg (4 mg Intravenous Given 05/03/22 1943)  ondansetron (ZOFRAN) injection 4 mg (4 mg Intravenous Given 05/03/22 1939)  midazolam (VERSED) injection 1 mg (1 mg Intravenous Given 05/03/22 1948)     The patient appears reasonably screen and/or stabilized for discharge and I doubt any other medical condition or other [facility] requiring further screening, evaluation, or treatment in the ED at this time prior to discharge.           Final Clinical Impression(s) / ED Diagnoses Final diagnoses:  Leg laceration, right, initial encounter    Rx / DC Orders ED Discharge Orders          Ordered    cephALEXin (KEFLEX) 500 MG capsule  05/03/22 2037              Deno Etienne, DO 05/03/22 2040

## 2022-05-03 NOTE — Discharge Instructions (Signed)
Return for redness drainage or if you get a fever.  The sutures placed in the leg are usually removed between day 10 and 14..  The area can get wet but not fully immersed underwater.  No scrubbing.  If you really want to clean it you can apply a half-and-half hydrogen peroxide solution with water on a Q-tip.  You can apply an ointment a couple times a day this could be as simple as Vaseline but could also be an antibiotic ointment if you wish.

## 2022-05-04 ENCOUNTER — Telehealth: Payer: Self-pay

## 2022-05-04 DIAGNOSIS — S81811A Laceration without foreign body, right lower leg, initial encounter: Secondary | ICD-10-CM | POA: Diagnosis not present

## 2022-05-04 NOTE — Telephone Encounter (Signed)
        Patient  visited [facility] on 3/26 Telephone encounter attempt :   1st A HIPAA compliant voice message was left requesting a return call.  Instructed patient to call back .    [facility] [phone], [place], [place] [address] Phone: [phone] Email: [email or web address]

## 2022-05-05 ENCOUNTER — Telehealth: Payer: Self-pay

## 2022-05-05 NOTE — Telephone Encounter (Signed)
        Patient  visited [facility] on 3/26     Telephone encounter attempt :2nd  A HIPAA compliant voice message was left requesting a return call.  Instructed patient to call back .   [facility] [phone] [address], [place], [place] [address] Phone: [phone] Email: [email or web address]

## 2022-05-16 ENCOUNTER — Emergency Department (HOSPITAL_COMMUNITY): Payer: PPO

## 2022-05-16 ENCOUNTER — Emergency Department (HOSPITAL_COMMUNITY)
Admission: EM | Admit: 2022-05-16 | Discharge: 2022-05-16 | Disposition: A | Discharge status: Home / Self Care | Payer: PPO | Attending: Emergency Medicine | Admitting: Emergency Medicine

## 2022-05-16 ENCOUNTER — Encounter (HOSPITAL_COMMUNITY): Payer: Self-pay

## 2022-05-16 ENCOUNTER — Other Ambulatory Visit: Payer: Self-pay

## 2022-05-16 DIAGNOSIS — Z79899 Other long term (current) drug therapy: Secondary | ICD-10-CM | POA: Diagnosis not present

## 2022-05-16 DIAGNOSIS — L03115 Cellulitis of right lower limb: Secondary | ICD-10-CM | POA: Diagnosis not present

## 2022-05-16 DIAGNOSIS — I10 Essential (primary) hypertension: Secondary | ICD-10-CM | POA: Diagnosis not present

## 2022-05-16 DIAGNOSIS — M7989 Other specified soft tissue disorders: Secondary | ICD-10-CM | POA: Diagnosis not present

## 2022-05-16 DIAGNOSIS — Z7901 Long term (current) use of anticoagulants: Secondary | ICD-10-CM | POA: Diagnosis not present

## 2022-05-16 DIAGNOSIS — R2241 Localized swelling, mass and lump, right lower limb: Secondary | ICD-10-CM | POA: Diagnosis present

## 2022-05-16 LAB — BASIC METABOLIC PANEL
Anion gap: 11 (ref 5–15)
BUN: 10 mg/dL (ref 8–23)
CO2: 22 mmol/L (ref 22–32)
Calcium: 9.2 mg/dL (ref 8.9–10.3)
Chloride: 103 mmol/L (ref 98–111)
Creatinine, Ser: 0.87 mg/dL (ref 0.61–1.24)
GFR, Estimated: >60 mL/min (ref >60)
Glucose, Bld: 93 mg/dL (ref 70–99)
Potassium: 3.7 mmol/L (ref 3.5–5.1)
Sodium: 136 mmol/L (ref 135–145)

## 2022-05-16 LAB — CBC WITH DIFFERENTIAL/PLATELET
Abs Immature Granulocytes: 0.04 10*3/uL (ref 0.00–0.07)
Basophils Absolute: 0.1 10*3/uL (ref 0.0–0.1)
Basophils Relative: 1 %
Eosinophils Absolute: 0.2 10*3/uL (ref 0.0–0.5)
Eosinophils Relative: 2 %
HCT: 50.2 % (ref 39.0–52.0)
Hemoglobin: 16.7 g/dL (ref 13.0–17.0)
Immature Granulocytes: 1 %
Lymphocytes Relative: 21 %
Lymphs Abs: 1.9 10*3/uL (ref 0.7–4.0)
MCH: 32.2 pg (ref 26.0–34.0)
MCHC: 33.3 g/dL (ref 30.0–36.0)
MCV: 96.7 fL (ref 80.0–100.0)
Monocytes Absolute: 0.7 10*3/uL (ref 0.1–1.0)
Monocytes Relative: 8 %
Neutro Abs: 6 10*3/uL (ref 1.7–7.7)
Neutrophils Relative %: 67 %
Platelets: 259 10*3/uL (ref 150–400)
RBC: 5.19 MIL/uL (ref 4.22–5.81)
RDW: 13.3 % (ref 11.5–15.5)
WBC: 8.9 10*3/uL (ref 4.0–10.5)
nRBC: 0 % (ref 0.0–0.2)

## 2022-05-16 LAB — LACTIC ACID, PLASMA
Lactic Acid, Venous: 1.1 mmol/L (ref 0.5–1.9)
Lactic Acid, Venous: 1 mmol/L (ref 0.5–1.9)

## 2022-05-16 LAB — SEDIMENTATION RATE: Sed Rate: 19 mm/hr — ABNORMAL HIGH (ref 0–16)

## 2022-05-16 LAB — C-REACTIVE PROTEIN: CRP: 0.6 mg/dL

## 2022-05-16 MED ORDER — SODIUM CHLORIDE 0.9 % IV SOLN
1.0000 g | Freq: Once | INTRAVENOUS | Status: AC
  Administered 2022-05-16: 1 g via INTRAVENOUS
  Filled 2022-05-16: qty 10

## 2022-05-16 MED ORDER — DEXTROSE 5 % IV SOLN: 1500.0000 mg | Freq: Once | INTRAVENOUS | Status: DC

## 2022-05-16 MED ORDER — CEFDINIR 300 MG PO CAPS: 300.0000 mg | ORAL_CAPSULE | Freq: Two times a day (BID) | ORAL | 0 refills | Status: AC

## 2022-05-16 MED ORDER — DOXYCYCLINE HYCLATE 100 MG PO TABS
100.0000 mg | ORAL_TABLET | Freq: Once | ORAL | Status: AC
  Administered 2022-05-16: 100 mg via ORAL
  Filled 2022-05-16: qty 1

## 2022-05-16 MED ORDER — DOXYCYCLINE HYCLATE 100 MG PO CAPS: 100.0000 mg | ORAL_CAPSULE | Freq: Two times a day (BID) | ORAL | 0 refills | Status: AC

## 2022-05-16 NOTE — ED Triage Notes (Addendum)
Reports was here on 3/26 and had large lac on right lower leg from tree branch.  Patient showed pic to ortho doc and referred to wound clinic but can't be seen for 4 weeks so he came here due to possible infection starting  Right leg is swollen and red.

## 2022-05-16 NOTE — ED Provider Notes (Signed)
[facility] EMERGENCY DEPARTMENT AT [facility] Provider Note   CSN: [id number] Arrival date & time: 05/16/22  1234     History  Chief Complaint  Patient presents with   Wound Infection    Mitchell Parrish is a 67 y.o. male.  68 year old male with history of hypertension, hyperlipidemia, and atrial fibrillation on Eliquis who presented to the emergency department with rash.  Patient was recently seen and treated for leg laceration that he sustained after cutting his leg on a branch.  Says that since going home has developed worsening redness and some mild amount of pain near the laceration since this weekend.  No fevers or chills.  Was on Keflex which finished on last Tuesday.  Was not able to get a wound care appointment until next month.  Not on any immunosuppressants and no history of diabetes.       Home Medications Prior to Admission medications   Medication Sig Start Date End Date Taking? Authorizing Provider  cefdinir (OMNICEF) 300 MG capsule Take 1 capsule (300 mg total) by mouth 2 (two) times daily for 10 days. 05/16/22 05/26/22 Yes Rondel Baton, MD  doxycycline (VIBRAMYCIN) 100 MG capsule Take 1 capsule (100 mg total) by mouth 2 (two) times daily for 10 days. 05/16/22 05/26/22 Yes Rondel Baton, MD  albuterol (VENTOLIN HFA) 108 (90 Base) MCG/ACT inhaler INHALE 2 PUFFS INTO THE LUNGS EVERY 4 HOURS AS NEEDED FOR WHEEZING OR SHORTNESS OF BREATH 01/25/22   Wendling, Jilda Roche, DO  cephALEXin (KEFLEX) 500 MG capsule 2 caps po bid x 7 days 05/03/22   Melene Plan, DO  co-enzyme Q-10 30 MG capsule Take 200 mg by mouth daily.    [provider]  COVID-19 mRNA bivalent vaccine, Pfizer, (PFIZER COVID-19 VAC BIVALENT) injection Inject into the muscle. 01/12/21   Judyann Munson, MD  dicyclomine (BENTYL) 20 MG tablet Take 1 tablet (20 mg total) by mouth 2 (two) times daily. 03/21/22   Darrick Grinder, PA-C  fluticasone (FLONASE) 50 MCG/ACT nasal spray Place 2  sprays into both nostrils as needed for allergies or rhinitis. 12/14/18   Sharlene Dory, DO  furosemide (LASIX) 40 MG tablet Take 1 tablet (40 mg total) by mouth daily as needed. 11/29/21   Camnitz, Will Daphine Deutscher, MD  INCRUSE ELLIPTA 62.5 MCG/ACT AEPB INHALE 1 PUFF INTO THE LUNGS DAILY 01/25/22   Sharlene Dory, DO  losartan (COZAAR) 100 MG tablet Take 1 tablet (100 mg total) by mouth daily. 10/25/21   Camnitz, Andree Coss, MD  metoprolol succinate (TOPROL-XL) 25 MG 24 hr tablet Take 1 tablet (25 mg total) by mouth 2 (two) times daily. Please keep upcoming appt.in Dec.with provider in order to receive future refills. Thank You. 11/24/21   Camnitz, Andree Coss, MD  potassium chloride (KLOR-CON) 10 MEQ tablet TAKE 1 TABLET BY MOUTH DAILY AS NEEDED WITH LASIX 11/29/21   Camnitz, Andree Coss, MD  SYMBICORT 160-4.5 MCG/ACT inhaler INHALE 1 PUFF INTO THE LUNGS IN THE MORNING AND AT BEDTIME 01/25/22   Wendling, Jilda Roche, DO  tiotropium (SPIRIVA HANDIHALER) 18 MCG inhalation capsule Place 1 capsule (18 mcg total) into inhaler and inhale daily. 05/24/21   Sharlene Dory, DO  tiZANidine (ZANAFLEX) 2 MG tablet Take 1-2 tablets (2-4 mg total) by mouth every 6 (six) hours as needed for muscle spasms. 01/12/21   Sharlene Dory, DO      Allergies    Lodine [etodolac]    Review of Systems  Review of Systems  Physical Exam Updated Vital Signs BP 138/79   Pulse 76   Temp 98 F (36.7 C)   Resp (!) 8   Ht 5\' 11"  (1.803 m)   Wt 108.9 kg   SpO2 98%   BMI 33.47 kg/m  Physical Exam Vitals and nursing note reviewed.  Constitutional:      General: He is not in acute distress.    Appearance: He is well-developed.  HENT:     Head: Normocephalic and atraumatic.     Right Ear: External ear normal.     Left Ear: External ear normal.     Nose: Nose normal.  Eyes:     Extraocular Movements: Extraocular movements intact.     Conjunctiva/sclera: Conjunctivae normal.      Pupils: Pupils are equal, round, and reactive to light.  Cardiovascular:     Rate and Rhythm: Normal rate and regular rhythm.  Pulmonary:     Effort: Pulmonary effort is normal. No respiratory distress.  Abdominal:     Palpations: Abdomen is soft.  Musculoskeletal:     Cervical back: Normal range of motion and neck supple.     Right lower leg: Edema present.     Left lower leg: Edema present.  Skin:    General: Skin is warm and dry.  Neurological:     Mental Status: He is alert. Mental status is at baseline.  Psychiatric:        Mood and Affect: Mood normal.        Behavior: Behavior normal.    Right lower extremity  ED Results / Procedures / Treatments   Labs (all labs ordered are listed, but only abnormal results are displayed) Labs Reviewed  SEDIMENTATION RATE - Abnormal; Notable for the following components:      Result Value   Sed Rate 19 (*)    All other components within normal limits  CULTURE, BLOOD (ROUTINE X 2)  CULTURE, BLOOD (ROUTINE X 2)  CBC WITH DIFFERENTIAL/PLATELET  BASIC METABOLIC PANEL  LACTIC ACID, PLASMA  C-REACTIVE PROTEIN  LACTIC ACID, PLASMA    EKG None  Radiology DG Tibia/Fibula Right  Result Date: 05/16/2022 CLINICAL DATA:  Wound swelling EXAM: RIGHT TIBIA AND FIBULA - 2 VIEW COMPARISON:  05/03/2022. FINDINGS: Patient is status post total knee arthroplasty. No acute fracture, dislocation or subluxation. No bony destructive process. There is a large plantar calcaneal spur. There is ankle mortise soft tissue swelling. No radiopaque foreign bodies. IMPRESSION: Soft tissue swelling of the ankle. Large plantar calcaneal spur. No acute osseous abnormalities. Electronically Signed   By: Layla Maw M.D.   On: 05/16/2022 13:43    Procedures Procedures   Medications Ordered in ED Medications  cefTRIAXone (ROCEPHIN) 1 g in sodium chloride 0.9 % 100 mL IVPB (0 g Intravenous Stopped 05/16/22 1529)  doxycycline (VIBRA-TABS) tablet 100 mg (100 mg  Oral Given 05/16/22 1443)    ED Course/ Medical Decision Making/ A&P                            Medical Decision Making Amount and/or Complexity of Data Reviewed Labs: ordered.  Risk Prescription drug management.   Mitchell Parrish is a 67 y.o. male with comorbidities that complicate the patient evaluation including hypertension, hyperlipidemia, and atrial fibrillation on Eliquis who presents emergency department with right lower extremity redness and pain  Initial Ddx:  Cellulitis, DVT, abscess, necrotizing fasciitis  MDM:  Feel the patient  likely has cellulitis based on his examination today.  Given the fact that the redness and erythema and warmth is only on the anterior portion of his leg feel DVT is less likely.  Additionally he is already on Eliquis and reports compliance.  No fluctuance noted or concern for abscess.  Does not have any crepitance or pain out of proportion to exam to suggest necrotizing fasciitis and is overall well-appearing.  Plan:  Labs Antibiotics  ED Summary/Re-evaluation:  Discussed Dalvance versus ceftriaxone and doxycycline with pharmacy since he has failed Keflex.  They recommended him be prescribed a third-generation cephalosporin such as ceftriaxone and doxycycline for outpatient treatment rather than dalbavancin since it is very expensive.  Patient's labs returned and were reassuring.  X-ray ordered from triage did not show any subcutaneous emphysema or other abnormalities.  Will have him follow-up with his primary doctor in several days and prescribed him antibiotics.  This patient presents to the ED for concern of complaints listed in HPI, this involves an extensive number of treatment options, and is a complaint that carries with it a high risk of complications and morbidity. Disposition including potential need for admission considered.   Dispo: DC Home. Return precautions discussed including, but not limited to, those listed in the AVS. Allowed pt  time to ask questions which were answered fully prior to dc.  Records reviewed Outpatient Clinic Notes The following labs were independently interpreted: Chemistry and show no acute abnormality I independently reviewed the following imaging with scope of interpretation limited to determining acute life threatening conditions related to emergency care: Extremity x-ray(s) and agree with the radiologist interpretation with the following exceptions: None I personally reviewed and interpreted cardiac monitoring: normal sinus rhythm  I personally reviewed and interpreted the pt's EKG: see above for interpretation  I have reviewed the patients home medications and made adjustments as needed  Final Clinical Impression(s) / ED Diagnoses Final diagnoses:  Cellulitis of right lower extremity    Rx / DC Orders ED Discharge Orders          Ordered    Ambulatory referral to Infectious Disease       Comments: Cellulitis patient:  Received dalbavancin on 05/16/2022.   05/16/22 1401    cefdinir (OMNICEF) 300 MG capsule  2 times daily        05/16/22 1533    doxycycline (VIBRAMYCIN) 100 MG capsule  2 times daily        05/16/22 1533              Rondel BatonPaterson, Cleaven C, MD 05/16/22 [phone]

## 2022-05-16 NOTE — Discharge Instructions (Signed)
You were seen for your leg infection (cellulitis) in the emergency department.   At home, please take the antibiotics (Omnicef and doxycycline) for your cellulitis.    Follow-up with your primary doctor in 2-3 days regarding your visit.    Return immediately to the emergency department if you experience any of the following: Fevers, spreading of the redness after 2 to 3 days of the antibiotics, or any other concerning symptoms.    Thank you for visiting our Emergency Department. It was a pleasure taking care of you today.

## 2022-05-17 ENCOUNTER — Encounter: Payer: Self-pay | Admitting: Family Medicine

## 2022-05-17 LAB — CULTURE, BLOOD (ROUTINE X 2)

## 2022-05-18 DIAGNOSIS — S81811D Laceration without foreign body, right lower leg, subsequent encounter: Secondary | ICD-10-CM | POA: Diagnosis not present

## 2022-05-18 LAB — CULTURE, BLOOD (ROUTINE X 2)

## 2022-05-19 ENCOUNTER — Telehealth: Payer: Self-pay | Admitting: *Deleted

## 2022-05-19 NOTE — Telephone Encounter (Signed)
        Patient  visited [facility] ed on 05/16/2022  for treatment    Telephone encounter attempt :  1st  A HIPAA compliant voice message was left requesting a return call.  Instructed patient to call back at [phone].  [name] -[facility], Population Health [phone] [address] [place] , [place] [address] Email : [name]

## 2022-05-20 ENCOUNTER — Encounter: Payer: Self-pay | Admitting: Family Medicine

## 2022-05-20 ENCOUNTER — Ambulatory Visit (INDEPENDENT_AMBULATORY_CARE_PROVIDER_SITE_OTHER): Payer: PPO | Admitting: Family Medicine

## 2022-05-20 ENCOUNTER — Telehealth: Payer: Self-pay | Admitting: *Deleted

## 2022-05-20 VITALS — BP 136/84 | HR 116 | Temp 98.0°F | Ht 71.0 in | Wt 251.2 lb

## 2022-05-20 DIAGNOSIS — S81801A Unspecified open wound, right lower leg, initial encounter: Secondary | ICD-10-CM | POA: Diagnosis not present

## 2022-05-20 LAB — CULTURE, BLOOD (ROUTINE X 2): Culture: NO GROWTH

## 2022-05-20 MED ORDER — TRAMADOL HCL 50 MG PO TABS
50.0000 mg | ORAL_TABLET | Freq: Three times a day (TID) | ORAL | 0 refills | Status: DC | PRN
Start: 2022-05-20 — End: 2022-06-11

## 2022-05-20 NOTE — Telephone Encounter (Signed)
        Patient  visited [facility] ed on 05/16/2022  for treatment    Telephone encounter attempt :  2nd  A HIPAA compliant voice message was left requesting a return call.  Instructed patient to call back at [phone]. [name] -[facility] , Population Health [phone] [address] [place] , [place] [address] Email : [name]

## 2022-05-20 NOTE — Patient Instructions (Addendum)
Keep dressing with what you have.   Finish the antibiotics.   When you do wash it, use only soap and water. Do not vigorously scrub.  Keep the area clean and dry.   Things to look out for: increasing pain not relieved by ibuprofen/acetaminophen, fevers, spreading redness, drainage of pus, or foul odor.  Please contact the GI team at: [phone]  Let us know if you need anything.

## 2022-05-20 NOTE — Progress Notes (Signed)
Chief Complaint  Patient presents with   Laceration    Mitchell Parrish is a 67 y.o. male here for a skin complaint.  Duration:  2.5  weeks Location: RLE Pruritic? No Painful? Yes Drainage? No Cut leg, had sutures and is seeing ortho/wound care.  Other associated symptoms: no fevers, redness improving Therapies tried thus far: doxycycline, Omnicef  Past Medical History:  Diagnosis Date   Acute diastolic CHF (congestive heart failure) 05/29/2018   Arthritis    left hip   Atrial fibrillation    Emphysema lung    Hyperlipidemia    Hypertension    Tubular adenoma of colon 11/2011    BP 136/84 (BP Location: Left Arm, Cuff Size: Large)   Pulse (!) 116   Temp 98 F (36.7 C) (Oral)   Ht 5\' 11"  (1.803 m)   Wt 251 lb 4 oz (114 kg)   SpO2 99%   BMI 35.04 kg/m  Gen: awake, alert, appearing stated age Lungs: No accessory muscle use Skin: See below. Mild ttp, drainage of serous material, otherwise no excessive warmth, fluctuance Psych: Age appropriate judgment and insight   RLE  Wound of right lower extremity, initial encounter - Plan: traMADol (ULTRAM) 50 MG tablet  He has an appointment with the wound care team in less than a month and is following with orthopedic surgery team.  Continue dressing the wound and keeping the area clean and dry.  I did give him some tramadol for as needed use.  Warnings about this medication verbalized and written down.  He will use it mainly to help him during painful nights. Warning signs and symptoms verbalized and written down in AVS. I will see him in a couple months for physical. [facility] info provided. The patient voiced understanding and agreement to the plan.  Jilda Roche [place], DO 05/20/22 9:19 AM

## 2022-05-21 LAB — CULTURE, BLOOD (ROUTINE X 2): Culture: NO GROWTH

## 2022-05-26 DIAGNOSIS — S81811A Laceration without foreign body, right lower leg, initial encounter: Secondary | ICD-10-CM | POA: Diagnosis not present

## 2022-05-30 ENCOUNTER — Ambulatory Visit: Payer: PPO | Admitting: Internal Medicine

## 2022-06-01 ENCOUNTER — Ambulatory Visit (HOSPITAL_BASED_OUTPATIENT_CLINIC_OR_DEPARTMENT_OTHER): Payer: PPO | Admitting: General Surgery

## 2022-06-02 ENCOUNTER — Encounter (HOSPITAL_BASED_OUTPATIENT_CLINIC_OR_DEPARTMENT_OTHER): Payer: PPO | Attending: Internal Medicine | Admitting: General Surgery

## 2022-06-02 DIAGNOSIS — E11622 Type 2 diabetes mellitus with other skin ulcer: Secondary | ICD-10-CM | POA: Diagnosis not present

## 2022-06-02 DIAGNOSIS — I5032 Chronic diastolic (congestive) heart failure: Secondary | ICD-10-CM | POA: Diagnosis not present

## 2022-06-02 DIAGNOSIS — S80811A Abrasion, right lower leg, initial encounter: Secondary | ICD-10-CM | POA: Diagnosis not present

## 2022-06-02 DIAGNOSIS — I110 Hypertensive heart disease with heart failure: Secondary | ICD-10-CM | POA: Insufficient documentation

## 2022-06-02 DIAGNOSIS — F17210 Nicotine dependence, cigarettes, uncomplicated: Secondary | ICD-10-CM | POA: Diagnosis not present

## 2022-06-02 DIAGNOSIS — G4730 Sleep apnea, unspecified: Secondary | ICD-10-CM | POA: Insufficient documentation

## 2022-06-02 DIAGNOSIS — M1990 Unspecified osteoarthritis, unspecified site: Secondary | ICD-10-CM | POA: Insufficient documentation

## 2022-06-02 DIAGNOSIS — L97812 Non-pressure chronic ulcer of other part 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: Secondary | ICD-10-CM | POA: Diagnosis not present

## 2022-06-02 DIAGNOSIS — J449 Chronic obstructive pulmonary disease, unspecified: Secondary | ICD-10-CM | POA: Insufficient documentation

## 2022-06-03 NOTE — Progress Notes (Signed)
KAMARRI, LOVVORN ([id number]) [id number] Page 1 of 9 Visit Report for 06/02/2022 Chief Complaint Document Details Patient Name: Date of Service: Mitchell Parrish, Mitchell Parrish 06/02/2022 9:00 A M Medical Record Number: [id number] Patient Account Number: [id number] Date of Birth/Sex: Treating RN: [birth year] (67 y.o. M) Primary Care Provider: Arva Chafe Other Clinician: Referring Provider: Treating Provider/Extender: Vivien Rossetti in Treatment: 0 Information Obtained from: Patient Chief Complaint Patient seen for complaints of Non-Healing Wound. Electronic Signature(s) Signed: 06/02/2022 10:49:34 AM By: Duanne Guess MD FACS Entered By: Duanne Guess on 06/02/2022 10:49:33 -------------------------------------------------------------------------------- Debridement Details Patient Name: Date of Service: Mitchell Savage Parrish. 06/02/2022 9:00 A M Medical Record Number: [id number] Patient Account Number: [id number] Date of Birth/Sex: Treating RN: [birth year] (67 y.o. M) Primary Care Provider: Arva Chafe Other Clinician: Referring Provider: Treating Provider/Extender: Vivien Rossetti in Treatment: 0 Debridement Performed for Assessment: Wound #1 Right,Anterior Lower Leg Performed By: Physician Duanne Guess, MD Debridement Type: Debridement Level of Consciousness (Pre-procedure): Awake and Alert Pre-procedure Verification/Time Out Yes - 09:40 Taken: Start Time: 09:43 Pain Control: Lidocaine 4% Topical Solution Percent of Wound Bed Debrided: 100% T Area Debrided (cm): otal 34.7 Tissue and other material debrided: Non-Viable, Eschar, Slough, Subcutaneous, Skin: Dermis , Skin: Epidermis, Slough Level: Skin/Subcutaneous Tissue Debridement Description: Excisional Instrument: Curette, Forceps, Scissors Bleeding: Minimum Hemostasis Achieved: Pressure Procedural Pain: 0 Post Procedural Pain:  0 Response to Treatment: Procedure was tolerated well Level of Consciousness (Post- Awake and Alert procedure): Post Debridement Measurements of Total Wound Length: (cm) 13 Width: (cm) 3.4 Depth: (cm) 0.1 Volume: (cm) 3.471 Character of Wound/Ulcer Post Debridement: Improved Post Procedure Diagnosis Same as Pre-procedure Notes Scribed for Dr Lady Gary by Redmond Pulling, RN Electronic Signature(s) Signed: 06/02/2022 10:55:11 AM By: Duanne Guess MD FACS Entered By: Duanne Guess on 06/02/2022 10:55:11 Mitchell Parrish, Mitchell Parrish ([id number]) [id number] Page 2 of 9 -------------------------------------------------------------------------------- HPI Details Patient Name: Date of Service: Mitchell Parrish, Mitchell Parrish 06/02/2022 9:00 A M Medical Record Number: [id number] Patient Account Number: [id number] Date of Birth/Sex: Treating RN: [birth year] (67 y.o. M) Primary Care Provider: Arva Chafe Other Clinician: Referring Provider: Treating Provider/Extender: Vivien Rossetti in Treatment: 0 History of Present Illness HPI Description: ADMISSION 06/02/2022 This is a 67 year old none diabetic with a fairly extensive cardiac history who was mowing his lawn on May 02, 2021 when he went into a branch that is partially broken off telemetry. It lacerated his leg and he presented to the emergency department for further evaluation and management. The wound was sutured and he was given a course of Keflex. He returned to the ED a little more than a week later with increased swelling and erythema. He was given cefdinir and doxycycline. He has since been treating it by applying Xeroform to the site. He does smoke, admitting to half a pack per day. He is not diabetic. ABI in clinic today 0.98. The sutures have been removed. There is a longitudinal wound on his anterior tibial surface. There is slough and nonviable skin on the surface. It appears to have  filled in considerably since the time of the initial injury when comparing to the photographs in the electronic medical record. There is no periwound erythema. He does have 1+ pitting edema. Electronic Signature(s) Signed: 06/02/2022 10:53:30 AM By: Duanne Guess MD FACS Entered By: Duanne Guess on 06/02/2022 10:53:29 -------------------------------------------------------------------------------- Physical Exam Details Patient Name: Date of Service: Mitchell Parrish, Mitchell BERT Parrish. 06/02/2022 9:00 A M Medical Record Number: [id number] Patient Account Number: [id number] Date  of Birth/Sex: Treating RN: 26-Jul-1955 (67 y.o. M) Primary Care Provider: Arva Chafe Other Clinician: Referring Provider: Treating Provider/Extender: Vivien Rossetti in Treatment: 0 Constitutional Hypertensive, asymptomatic. . . . No acute distress. Respiratory Normal work of breathing on room air. Cardiovascular 1+ pitting edema. Notes 06/02/2022: There is a longitudinal wound on his anterior tibial surface. There is slough and nonviable skin on the surface. It appears to have filled in considerably since the time of the initial injury when comparing to the photographs in the electronic medical record. There is no periwound erythema. He does have 1+ pitting edema. Electronic Signature(s) Signed: 06/02/2022 10:54:31 AM By: Duanne Guess MD FACS Entered By: Duanne Guess on 06/02/2022 10:54:31 -------------------------------------------------------------------------------- Physician Orders Details Patient Name: Date of Service: Mitchell Savage Parrish. 06/02/2022 9:00 A M Medical Record Number: [id number] Patient Account Number: [id number] Date of Birth/Sex: Treating RN: 25-Jun-1955 (67 y.o. Mitchell Parrish Primary Care Provider: Arva Chafe Other Clinician: Referring Provider: Treating Provider/Extender: Vivien Rossetti in Treatment: 0 Verbal / Phone  Orders: No Mitchell Parrish, Mitchell Parrish ([id number]) [id number] Page 3 of 9 Diagnosis Coding ICD-10 Coding Code Description [phone] Non-pressure chronic ulcer of other part 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 I50.32 Chronic diastolic (congestive) heart failure J44.9 Chronic obstructive pulmonary disease, unspecified I10 Essential (primary) hypertension Z72.0 Tobacco use Follow-up Appointments ppointment in 1 week. - Dr Lady Gary - room 1 Return A Thursday May 2 @ 0815 Anesthetic Wound #1 Right,Anterior Lower Leg (In clinic) Topical Lidocaine 4% applied to wound bed Bathing/ Shower/ Hygiene May shower with protection but do not get wound dressing(s) wet. Protect dressing(s) with water repellant cover (for example, large plastic bag) or a cast cover and may then take shower. Edema Control - Lymphedema / SCD / Other Right Lower Extremity Elevate legs to the level of the heart or above for 30 minutes daily and/or when sitting for 3-4 times a day throughout the day. Avoid standing for long periods of time. Exercise regularly Moisturize legs daily. Wound Treatment Wound #1 - Lower Leg Wound Laterality: Right, Anterior Cleanser: Soap and Water 1 x Per Week/30 Days Discharge Instructions: May shower and wash wound with dial antibacterial soap and water prior to dressing change. Cleanser: Vashe 5.8 (oz) 1 x Per Week/30 Days Discharge Instructions: Cleanse the wound with Vashe prior to applying a clean dressing using gauze sponges, not tissue or cotton balls. Peri-Wound Care: Sween Lotion (Moisturizing lotion) 1 x Per Week/30 Days Discharge Instructions: Apply moisturizing lotion as directed Prim Dressing: IODOFLEX 0.9% Cadexomer Iodine Pad 4x6 cm 1 x Per Week/30 Days ary Discharge Instructions: Apply to wound bed as instructed Secondary Dressing: ABD Pad, 8x10 1 x Per Week/30 Days Discharge Instructions: Apply over primary dressing as directed. Compression Wrap:  Urgo K2 Lite, two layer compression system, regular 1 x Per Week/30 Days Discharge Instructions: Apply Urgo K2 Lite as directed (alternative to 3 layer compression). Compression Wrap: Surgilast Tubular Elastic Stretch Net Dressing Size 4 1 x Per Week/30 Days Patient Medications llergies: etodolac A Notifications Medication Indication Start End prior to debridement 06/02/2022 lidocaine DOSE topical 4 % cream - cream topical once daily Electronic Signature(s) Signed: 06/02/2022 3:29:20 PM By: Redmond Pulling RN, BSN Signed: 06/02/2022 4:24:20 PM By: Duanne Guess MD FACS Entered By: Redmond Pulling on 06/02/2022 09:52:28 -------------------------------------------------------------------------------- Problem List Details Patient Name: Date of Service: Mitchell Parrish. 06/02/2022 9:00 A M Medical Record Number: [id number] Patient Account Number: [id number] Date of Birth/Sex: Treating RN:  [birth year] (66 y.o. Mitchell Parrish, Mitchell Parrish ([id number]) [id number] Page 4 of 9 Primary Care Provider: Arva Chafe Other Clinician: Referring Provider: Treating Provider/Extender: Vivien Rossetti in Treatment: 0 Active Problems ICD-10 Encounter Code Description Active Date MDM Diagnosis L97.812 Non-pressure chronic ulcer of other part of right lower leg with fat layer 06/02/2022 No Yes exposed I50.32 Chronic diastolic (congestive) heart failure 06/02/2022 No Yes J44.9 Chronic obstructive pulmonary disease, unspecified 06/02/2022 No Yes I10 Essential (primary) hypertension 06/02/2022 No Yes Z72.0 Tobacco use 06/02/2022 No Yes Inactive Problems Resolved Problems Electronic Signature(s) Signed: 06/02/2022 9:22:23 AM By: Duanne Guess MD FACS Entered By: Duanne Guess on 06/02/2022 09:22:23 -------------------------------------------------------------------------------- Progress Note Details Patient Name: Date of Service: Mitchell Savage Parrish. 06/02/2022 9:00 A M Medical Record Number: [id number] Patient Account Number: [id number] Date of Birth/Sex: Treating RN: 12-03-1955 (67 y.o. M) Primary Care Provider: Arva Chafe Other Clinician: Referring Provider: Treating Provider/Extender: Vivien Rossetti in Treatment: 0 Subjective Chief Complaint Information obtained from Patient Patient seen for complaints of Non-Healing Wound. History of Present Illness (HPI) ADMISSION 06/02/2022 This is a 67 year old none diabetic with a fairly extensive cardiac history who was mowing his lawn on May 02, 2021 when he went into a branch that is partially broken off telemetry. It lacerated his leg and he presented to the emergency department for further evaluation and management. The wound was sutured and he was given a course of Keflex. He returned to the ED a little more than a week later with increased swelling and erythema. He was given cefdinir and doxycycline. He has since been treating it by applying Xeroform to the site. He does smoke, admitting to half a pack per day. He is not diabetic. ABI in clinic today 0.98. The sutures have been removed. There is a longitudinal wound on his anterior tibial surface. There is slough and nonviable skin on the surface. It appears to have filled in considerably since the time of the initial injury when comparing to the photographs in the electronic medical record. There is no periwound erythema. He does have 1+ pitting edema. Patient History Information obtained from Patient. Allergies etodolac (Severity: Mild, Reaction: rash) Mitchell Parrish, Mitchell Parrish ([id number]) [id number] Page 5 of 9 Family History Cancer - Mother,Father, Diabetes - Father, Heart Disease - Father, Hypertension - Father, Lung Disease - Mother, Stroke - Father, Tuberculosis - Paternal Grandparents, No family history of Kidney Disease, Seizures, Thyroid Problems. Social  History Current every day smoker - 1/2 pack day 20+ years, Marital Status - Married, Alcohol Use - Moderate, Drug Use - No History, Caffeine Use - Daily. Medical History Respiratory Patient has history of Chronic Obstructive Pulmonary Disease (COPD), Sleep Apnea Cardiovascular Patient has history of Arrhythmia, Congestive Heart Failure, Hypertension Musculoskeletal Patient has history of Osteoarthritis Medical A Surgical History Notes nd Ear/Nose/Mouth/Throat seasonal allergies Review of Systems (ROS) Constitutional Symptoms (General Health) Denies complaints or symptoms of Fatigue, Fever, Chills, Marked Weight Change. Eyes Complains or has symptoms of Glasses / Contacts. Respiratory Denies complaints or symptoms of Chronic or frequent coughs, Shortness of Breath. Gastrointestinal Denies complaints or symptoms of Frequent diarrhea, Nausea, Vomiting. Endocrine Denies complaints or symptoms of Heat/cold intolerance. Genitourinary Denies complaints or symptoms of Frequent urination. Integumentary (Skin) Complains or has symptoms of Wounds - left lower leg. Musculoskeletal Denies complaints or symptoms of Muscle Pain, Muscle Weakness. Neurologic Denies complaints or symptoms of Numbness/parasthesias. Psychiatric Denies complaints or symptoms of Claustrophobia. Objective Constitutional Hypertensive, asymptomatic. No acute distress. Vitals Time Taken:  8:47 AM, Height: 71 in, Weight: 247 lbs, BMI: 34.4, Temperature: 98.4 F, Pulse: 76 bpm, Respiratory Rate: 20 breaths/min, Blood Pressure: 163/84 mmHg. Respiratory Normal work of breathing on room air. Cardiovascular 1+ pitting edema. General Notes: 06/02/2022: There is a longitudinal wound on his anterior tibial surface. There is slough and nonviable skin on the surface. It appears to have filled in considerably since the time of the initial injury when comparing to the photographs in the electronic medical record. There is no  periwound erythema. He does have 1+ pitting edema. Integumentary (Hair, Skin) Wound #1 status is Open. Original cause of wound was Trauma. The date acquired was: 05/03/2022. The wound is located on the Right,Anterior Lower Leg. The wound measures 13cm length x 3.4cm width x 0.1cm depth; 34.715cm^2 area and 3.471cm^3 volume. There is Fat Layer (Subcutaneous Tissue) exposed. There is no tunneling or undermining noted. There is a medium amount of serosanguineous drainage noted. There is medium (34-66%) red granulation within the wound bed. There is a medium (34-66%) amount of necrotic tissue within the wound bed including Eschar and Adherent Slough. Assessment Active Problems ICD-10 Non-pressure chronic ulcer of other part 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 Chronic diastolic (congestive) heart failure Chronic obstructive pulmonary disease, unspecified Essential (primary) hypertension Tobacco use Mitchell Parrish, Mitchell Parrish ([id number]) [phone].pdf Page 6 of 9 Procedures Wound #1 Pre-procedure diagnosis of Wound #1 is an Abrasion located on the Right,Anterior Lower Leg . There was a Excisional Skin/Subcutaneous Tissue Debridement with a total area of 34.7 sq cm performed by Duanne Guess, MD. With the following instrument(s): Curette, Forceps, and Scissors to remove Non-Viable tissue/material. Material removed includes Eschar, Subcutaneous Tissue, Slough, Skin: Dermis, and Skin: Epidermis after achieving pain control using Lidocaine 4% T opical Solution. No specimens were taken. A time out was conducted at 09:40, prior to the start of the procedure. A Minimum amount of bleeding was controlled with Pressure. The procedure was tolerated well with a pain level of 0 throughout and a pain level of 0 following the procedure. Post Debridement Measurements: 13cm length x 3.4cm width x 0.1cm depth; 3.471cm^3 volume. Character of Wound/Ulcer Post Debridement is improved. Post  procedure Diagnosis Wound #1: Same as Pre-Procedure General Notes: Scribed for Dr Lady Gary by Redmond Pulling, RN. Plan Follow-up Appointments: Return Appointment in 1 week. - Dr Lady Gary - room 1 Thursday May 2 @ 0815 Anesthetic: Wound #1 Right,Anterior Lower Leg: (In clinic) Topical Lidocaine 4% applied to wound bed Bathing/ Shower/ Hygiene: May shower with protection but do not get wound dressing(s) wet. Protect dressing(s) with water repellant cover (for example, large plastic bag) or a cast cover and may then take shower. Edema Control - Lymphedema / SCD / Other: Elevate legs to the level of the heart or above for 30 minutes daily and/or when sitting for 3-4 times a day throughout the day. Avoid standing for long periods of time. Exercise regularly Moisturize legs daily. The following medication(s) was prescribed: lidocaine topical 4 % cream cream topical once daily for prior to debridement was prescribed at facility WOUND #1: - Lower Leg Wound Laterality: Right, Anterior Cleanser: Soap and Water 1 x Per Week/30 Days Discharge Instructions: May shower and wash wound with dial antibacterial soap and water prior to dressing change. Cleanser: Vashe 5.8 (oz) 1 x Per Week/30 Days Discharge Instructions: Cleanse the wound with Vashe prior to applying a clean dressing using gauze sponges, not tissue or cotton balls. Peri-Wound Care: Sween Lotion (Moisturizing lotion) 1 x Per Week/30  Days Discharge Instructions: Apply moisturizing lotion as directed Prim Dressing: IODOFLEX 0.9% Cadexomer Iodine Pad 4x6 cm 1 x Per Week/30 Days ary Discharge Instructions: Apply to wound bed as instructed Secondary Dressing: ABD Pad, 8x10 1 x Per Week/30 Days Discharge Instructions: Apply over primary dressing as directed. Com pression Wrap: Urgo K2 Lite, two layer compression system, regular 1 x Per Week/30 Days Discharge Instructions: Apply Urgo K2 Lite as directed (alternative to 3 layer compression). Com  pression Wrap: Surgilast Tubular Elastic Stretch Net Dressing Size 4 1 x Per Week/30 Days 06/02/2022: This is a 67 year old nondiabetic who suffered a laceration from a tree branch well on a riding mower. There is a longitudinal wound on his anterior tibial surface. There is slough and nonviable skin on the surface. It appears to have filled in considerably since the time of the initial injury when comparing to the photographs in the electronic medical record. There is no periwound erythema. He does have 1+ pitting edema. I used a combination of curette, scissors, and forceps to debride the wound. I included skin, eschar, slough, and subcutaneous tissue in my debridement. The wound will benefit from further chemical debridement so we will apply Iodoflex. Will use 3 layer equivalent compression. I discussed elevation during the day is much as possible and at night while sleeping. I reinforced the need to minimize nicotine exposure to aid in wound healing, as well as the importance of adequate protein intake. He will follow-up in 1 week. Electronic Signature(s) Signed: 06/02/2022 10:56:31 AM By: Duanne Guess MD FACS Entered By: Duanne Guess on 06/02/2022 10:56:31 -------------------------------------------------------------------------------- HxROS Details Patient Name: Date of Service: Mitchell Parrish, Mitchell BERT Parrish. 06/02/2022 9:00 A M Medical Record Number: [id number] Patient Account Number: [id number] Date of Birth/Sex: Treating RN: 31-Jul-1955 (66 y.o. Mitchell Parrish Primary Care Provider: Arva Chafe Other Clinician: Referring Provider: Treating Provider/Extender: Vivien Rossetti in Treatment: [address] Mitchell Parrish, Mitchell Parrish ([id number]) [id number] Page 7 of 9 Information Obtained From Patient Constitutional Symptoms (General Health) Complaints and Symptoms: Negative for: Fatigue; Fever; Chills; Marked Weight Change Eyes Complaints and  Symptoms: Positive for: Glasses / Contacts Respiratory Complaints and Symptoms: Negative for: Chronic or frequent coughs; Shortness of Breath Medical History: Positive for: Chronic Obstructive Pulmonary Disease (COPD); Sleep Apnea Gastrointestinal Complaints and Symptoms: Negative for: Frequent diarrhea; Nausea; Vomiting Endocrine Complaints and Symptoms: Negative for: Heat/cold intolerance Genitourinary Complaints and Symptoms: Negative for: Frequent urination Integumentary (Skin) Complaints and Symptoms: Positive for: Wounds - left lower leg Musculoskeletal Complaints and Symptoms: Negative for: Muscle Pain; Muscle Weakness Medical History: Positive for: Osteoarthritis Neurologic Complaints and Symptoms: Negative for: Numbness/parasthesias Psychiatric Complaints and Symptoms: Negative for: Claustrophobia Ear/Nose/Mouth/Throat Medical History: Past Medical History Notes: seasonal allergies Hematologic/Lymphatic Cardiovascular Medical History: Positive for: Arrhythmia; Congestive Heart Failure; Hypertension Immunological Oncologic Immunizations Mitchell Parrish, Mitchell Parrish ([id number]) [id number] Page 8 of 9 Pneumococcal Vaccine: Received Pneumococcal Vaccination: Yes Received Pneumococcal Vaccination On or After 60th Birthday: Yes Implantable Devices None Family and Social History Cancer: Yes - Mother,Father; Diabetes: Yes - Father; Heart Disease: Yes - Father; Hypertension: Yes - Father; Kidney Disease: No; Lung Disease: Yes - Mother; Seizures: No; Stroke: Yes - Father; Thyroid Problems: No; Tuberculosis: Yes - Paternal Grandparents; Current every day smoker - 1/2 pack day 20+ years; Marital Status - Married; Alcohol Use: Moderate; Drug Use: No History; Caffeine Use: Daily; Financial Concerns: No; Food, Clothing or Shelter Needs: No; Support System Lacking: No; Transportation Concerns: No Psychologist, prison and probation services) Signed: 06/02/2022 3:29:20 PM By:  Redmond Pulling RN, BSN  Signed: 06/02/2022 4:24:20 PM By: Duanne Guess MD FACS Entered By: Redmond Pulling on 06/02/2022 09:20:13 -------------------------------------------------------------------------------- SuperBill Details Patient Name: Date of Service: Mitchell Parrish 06/02/2022 Medical Record Number: [id number] Patient Account Number: [id number] Date of Birth/Sex: Treating RN: 1955/07/25 (67 y.o. Mitchell Parrish Primary Care Provider: Arva Chafe Other Clinician: Referring Provider: Treating Provider/Extender: Vivien Rossetti in Treatment: 0 Diagnosis Coding ICD-10 Codes Code Description [phone] Non-pressure chronic ulcer of other part 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 I50.32 Chronic diastolic (congestive) heart failure J44.9 Chronic obstructive pulmonary disease, unspecified I10 Essential (primary) hypertension Z72.0 Tobacco use Facility Procedures : CPT4 Code: [id number] Description: 99213 - WOUND CARE VISIT-LEV 3 EST PT Modifier: 25 Quantity: 1 : CPT4 Code: [id number] Description: 11042 - DEB SUBQ TISSUE 20 SQ CM/< ICD-10 Diagnosis Description L97.812 Non-pressure chronic ulcer of other part of right lower leg with fat layer expo Modifier: sed Quantity: 1 : CPT4 Code: [id number] Description: 11045 - DEB SUBQ TISS EA ADDL 20CM ICD-10 Diagnosis Description L97.812 Non-pressure chronic ulcer of other part of right lower leg with fat layer expo Modifier: sed Quantity: 1 Physician Procedures : CPT4 Code Description Modifier [id number] 99204 - WC PHYS LEVEL 4 - NEW PT 25 ICD-10 Diagnosis Description L97.812 Non-pressure chronic ulcer of other part 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 I50.32 Chronic diastolic (congestive) heart failure J44.9  Chronic obstructive pulmonary disease, unspecified Z72.0 Tobacco use Quantity: 1 : [id number] 11042 - WC PHYS SUBQ TISS 20 SQ CM ICD-10 Diagnosis Description L97.812 Non-pressure chronic ulcer  of other part 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 Quantity: 1 : [id number] 11045 - WC PHYS SUBQ TISS EA ADDL 20 CM ICD-10 Diagnosis Description L97.812 Non-pressure chronic ulcer of other part 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 BREYTON, VANSCYOC Parrish ([id number]) [phone].pdf Pa Quantity: 1 ge 9 of 9 Electronic Signature(s) Signed: 06/02/2022 10:57:53 AM By: Duanne Guess MD FACS Entered By: Duanne Guess on 06/02/2022 10:57:53

## 2022-06-03 NOTE — Progress Notes (Signed)
OLON, RUSS ([id number]) [id number] Page 1 of 4 Visit Report for 06/02/2022 Abuse Risk Screen Details Patient Name: Date of Service: Mitchell Parrish, Mitchell Parrish 06/02/2022 9:00 A M Medical Record Number: [id number] Patient Account Number: [id number] Date of Birth/Sex: Treating RN: 08[birth year]/Mitchell Parrish (67 y.o. Cline Cools Primary Care Jewelz Kobus: Arva Chafe Other Clinician: Referring Yonis Carreon: Treating Arrianna Catala/Extender: Vivien Rossetti in Treatment: 0 Abuse Risk Screen Items Answer ABUSE RISK SCREEN: Has anyone close to you tried to hurt or harm you recentlyo No Do you feel uncomfortable with anyone in your familyo No Has anyone forced you do things that you didnt want to doo No Electronic Signature(s) Signed: 06/02/2022 3:29:20 PM By: Redmond Pulling RN, BSN Entered By: Redmond Pulling on 06/02/2022 09:20:21 -------------------------------------------------------------------------------- Activities of Daily Living Details Patient Name: Date of Service: Mitchell Parrish, Mitchell Parrish 06/02/2022 9:00 A M Medical Record Number: [id number] Patient Account Number: [id number] Date of Birth/Sex: Treating RN: [birth year]-Mitchell Parrish (67 y.o. Cline Cools Primary Care Anberlin Diez: Arva Chafe Other Clinician: Referring Travious Vanover: Treating Wendie Diskin/Extender: Vivien Rossetti in Treatment: 0 Activities of Daily Living Items Answer Activities of Daily Living (Please select one for each item) Drive Automobile Completely Able T Medications ake Completely Able Use T elephone Completely Able Care for Appearance Completely Able Use T oilet Completely Able Bath / Shower Completely Able Dress Self Completely Able Feed Self Completely Able Walk Completely Able Get In / Out Bed Completely Able Housework Completely Able Prepare Meals Completely Able Handle Money Completely Able Shop for Self Completely Able Electronic  Signature(s) Signed: 06/02/2022 3:29:20 PM By: Redmond Pulling RN, BSN Entered By: Redmond Pulling on 06/02/2022 09:20:49 -------------------------------------------------------------------------------- Education Screening Details Patient Name: Date of Service: Mitchell Savage Parrish. 06/02/2022 9:00 A M Medical Record Number: [id number] Patient Account Number: [id number] Date of Birth/Sex: Treating RN: [birth year]Mitchell Parrish (67 y.o. Cline Cools Primary Care Tacoya Altizer: Arva Chafe Other Clinician: Referring Johney Perotti: Treating Shaela Boer/Extender: Vivien Rossetti in Treatment: [address] Mitchell Parrish, Mitchell Parrish ([id number]) [id number] Page 2 of 4 Learning Preferences/Education Level/Primary Language Learning Preference: Explanation, Demonstration, Printed Material Highest Education Level: High School Preferred Language: Economist Language Barrier: No Translator Needed: No Memory Deficit: No Emotional Barrier: No Cultural/Religious Beliefs Affecting Medical Care: No Physical Barrier Impaired Vision: No Impaired Hearing: No Decreased Hand dexterity: No Knowledge/Comprehension Knowledge Level: High Comprehension Level: High Ability to understand written instructions: High Ability to understand verbal instructions: High Motivation Anxiety Level: Calm Cooperation: Cooperative Education Importance: Acknowledges Need Interest in Health Problems: Asks Questions Perception: Coherent Willingness to Engage in Self-Management High Activities: Readiness to Engage in Self-Management High Activities: Electronic Signature(s) Signed: 06/02/2022 3:29:20 PM By: Redmond Pulling RN, BSN Entered By: Redmond Pulling on 06/02/2022 09:21:21 -------------------------------------------------------------------------------- Fall Risk Assessment Details Patient Name: Date of Service: Mitchell Parrish, Mitchell BERT Parrish. 06/02/2022 9:00 A M Medical Record Number:  [id number] Patient Account Number: [id number] Date of Birth/Sex: Treating RN: 07/[birth year]/Mitchell Parrish (67 y.o. Cline Cools Primary Care Michiel Sivley: Arva Chafe Other Clinician: Referring Sheranda Seabrooks: Treating Lydiah Pong/Extender: Vivien Rossetti in Treatment: 0 Fall Risk Assessment Items Have you had 2 or more falls in the last 12 monthso 0 No Have you had any fall that resulted in injury in the last 12 monthso 0 No FALLS RISK SCREEN History of falling - immediate or within 3 months 0 No Secondary diagnosis (Do you have 2 or more medical diagnoseso) 0 No Ambulatory aid None/bed rest/wheelchair/nurse 0 No Crutches/cane/walker 0 No Furniture 0 No Intravenous  therapy Access/Saline/Heparin Lock 0 No Gait/Transferring Normal/ bed rest/ wheelchair 0 No Weak (short steps with or without shuffle, stooped but able to lift head while walking, may seek 0 No support from furniture) Impaired (short steps with shuffle, may have difficulty arising from chair, head down, impaired 0 No balance) Mental Status Oriented to own ability 0 No Overestimates or forgets limitations 0 No Risk Level: Low Risk Score: 0 Mitchell Parrish, Mitchell Parrish ([id number]) [phone] [id number] Page 3 of 4 Electronic Signature(s) -------------------------------------------------------------------------------- Foot Assessment Details Patient Name: Date of Service: Mitchell Parrish, Mitchell Parrish 06/02/2022 9:00 A M Medical Record Number: [id number] Patient Account Number: [id number] Date of Birth/Sex: Treating RN: Mitchell Parrish[birth year] (67 y.o. Cline Cools Primary Care Mya Suell: Arva Chafe Other Clinician: Referring Ayeza Therriault: Treating Janney Priego/Extender: Vivien Rossetti in Treatment: 0 Foot Assessment Items Site Locations + = Sensation present, - = Sensation absent, C = Callus, U = Ulcer R = Redness, W = Warmth, M = Maceration, PU = Pre-ulcerative lesion F = Fissure, S  = Swelling, D = Dryness Assessment Right: Left: Other Deformity: No No Prior Foot Ulcer: No No Prior Amputation: No No Charcot Joint: No No Ambulatory Status: Gait: Electronic Signature(s) Signed: 06/02/2022 3:29:20 PM By: Redmond Pulling RN, BSN Entered By: Redmond Pulling on 06/02/2022 09:22:43 -------------------------------------------------------------------------------- Nutrition Risk Screening Details Patient Name: Date of Service: Mitchell Savage Parrish. 06/02/2022 9:00 A M Medical Record Number: [id number] Patient Account Number: [id number] Date of Birth/Sex: Treating RN: Mitchell Parrish, Mitchell Parrish (67 y.o. Cline Cools Primary Care Willian Donson: Arva Chafe Other Clinician: Referring Kjuan Seipp: Treating Timithy Arons/Extender: Vivien Rossetti in Treatment: 0 Height (in): 71 Weight (lbs): 247 Body Mass Index (BMI): 34.4 Saunders, Mishawn Parrish ([id number]) [phone] [id number] Page 4 of 4 Nutrition Risk Screening Items Score Screening NUTRITION RISK SCREEN: I have an illness or condition that made me change the kind and/or amount of food I eat 0 No I eat fewer than two meals per day 0 No I eat few fruits and vegetables, or milk products 0 No I have three or more drinks of beer, liquor or wine almost every day 0 No I have tooth or mouth problems that make it hard for me to eat 0 No I don't always have enough money to buy the food I need 0 No I eat alone most of the time 0 No I take three or more different prescribed or over-the-counter drugs a day 1 Yes Without wanting to, I have lost or gained 10 pounds in the last six months 0 No I am not always physically able to shop, cook and/or feed myself 0 No Nutrition Protocols Good Risk Protocol Moderate Risk Protocol High Risk Proctocol Risk Level: Good Risk Score: 1 Electronic Signature(s) Signed: 06/02/2022 3:29:20 PM By: Redmond Pulling RN, BSN Entered By: Redmond Pulling on 06/02/2022  09:21:52

## 2022-06-04 NOTE — Progress Notes (Signed)
JVION, TURGEON ([id number]) [id number] Page 1 of 8 Visit Report for 06/02/2022 Allergy List Details Patient Name: Date of Service: IOKEPA, GEFFRE 06/02/2022 9:00 A M Medical Record Number: [id number] Patient Account Number: [id number] Date of Birth/Sex: Treating RN: November 04, 1955 (67 y.o. Cline Cools Primary Care Guilianna Mckoy: Arva Chafe Other Clinician: Referring Vennessa Affinito: Treating Shadman Tozzi/Extender: Ocie Doyne Weeks in Treatment: 0 Allergies Active Allergies etodolac Reaction: rash Severity: Mild Allergy Notes Electronic Signature(s) Signed: 06/02/2022 3:29:20 PM By: Redmond Pulling RN, BSN Entered By: Redmond Pulling on 06/02/2022 09:13:02 -------------------------------------------------------------------------------- Arrival Information Details Patient Name: Date of Service: [name]. 06/02/2022 9:00 A M Medical Record Number: [id number] Patient Account Number: [id number] Date of Birth/Sex: Treating RN: Feb 04, 1956 (67 y.o. M) Primary Care Odaliz Mcqueary: Arva Chafe Other Clinician: Referring Shuaib Corsino: Treating Osker Ayoub/Extender: Vivien Rossetti in Treatment: 0 Visit Information Patient Arrived: Ambulatory Arrival Time: 08:47 Accompanied By: self Transfer Assistance: None Patient Identification Verified: Yes Secondary Verification Process Completed: Yes Electronic Signature(s) Signed: 06/02/2022 3:29:20 PM By: Redmond Pulling RN, BSN Entered By: Redmond Pulling on 06/02/2022 09:07:23 -------------------------------------------------------------------------------- Clinic Level of Care Assessment Details Patient Name: Date of Service: SHERIDAN, GETTEL 06/02/2022 9:00 A M Medical Record Number: [id number] Patient Account Number: [id number] Date of Birth/Sex: Treating RN: [birth year] (67 y.o. Cline Cools Primary Care Bhavesh Vazquez: Arva Chafe Other  Clinician: Referring Gershom Brobeck: Treating Hager Compston/Extender: Vivien Rossetti in Treatment: 0 Clinic Level of Care Assessment Items TOOL 1 Quantity Score X- 1 0 Use when EandM and Procedure is performed on INITIAL visit ASSESSMENTS - Nursing Assessment / Reassessment X- 1 20 General Physical Exam (combine w/ comprehensive assessment (listed just below) when performed on new pt. evals) X- 1 25 Comprehensive Assessment (HX, ROS, Risk Assessments, Wounds Hx, etc.) KAYVAN, HOEFLING ([id number]) [id number] Page 2 of 8 ASSESSMENTS - Wound and Skin Assessment / Reassessment []  - 0 Dermatologic / Skin Assessment (not related to wound area) ASSESSMENTS - Ostomy and/or Continence Assessment and Care []  - 0 Incontinence Assessment and Management []  - 0 Ostomy Care Assessment and Management (repouching, etc.) PROCESS - Coordination of Care X - Simple Patient / Family Education for ongoing care 1 15 []  - 0 Complex (extensive) Patient / Family Education for ongoing care X- 1 10 Staff obtains Chiropractor, Records, T Results / Process Orders est []  - 0 Staff telephones HHA, Nursing Homes / Clarify orders / etc []  - 0 Routine Transfer to another Facility (non-emergent condition) []  - 0 Routine Hospital Admission (non-emergent condition) X- 1 15 New Admissions / Manufacturing engineer / Ordering NPWT Apligraf, etc. , []  - 0 Emergency Hospital Admission (emergent condition) PROCESS - Special Needs []  - 0 Pediatric / Minor Patient Management []  - 0 Isolation Patient Management []  - 0 Hearing / Language / Visual special needs []  - 0 Assessment of Community assistance (transportation, D/C planning, etc.) []  - 0 Additional assistance / Altered mentation []  - 0 Support Surface(s) Assessment (bed, cushion, seat, etc.) INTERVENTIONS - Miscellaneous []  - 0 External ear exam []  - 0 Patient Transfer (multiple staff / Nurse, adult / Similar  devices) []  - 0 Simple Staple / Suture removal (25 or less) []  - 0 Complex Staple / Suture removal (26 or more) []  - 0 Hypo/Hyperglycemic Management (do not check if billed separately) X- 1 15 Ankle / Brachial Index (ABI) - do not check if billed separately Has the patient been seen at the hospital within the last three years: Yes Total  Score: 100 Level Of Care: New/Established - Level 3 Electronic Signature(s) Signed: 06/02/2022 3:29:20 PM By: Redmond Pulling RN, BSN Entered By: Redmond Pulling on 06/02/2022 10:17:58 -------------------------------------------------------------------------------- Encounter Discharge Information Details Patient Name: Date of Service: [name]. 06/02/2022 9:00 A M Medical Record Number: [id number] Patient Account Number: [id number] Date of Birth/Sex: Treating RN: 1955-12-02 (67 y.o. Cline Cools Primary Care Jevaughn Degollado: Arva Chafe Other Clinician: Referring Yocelyn Brocious: Treating Prerana Strayer/Extender: Vivien Rossetti in Treatment: 0 Encounter Discharge Information Items Post Procedure Vitals Discharge Condition: Stable Temperature (F): 98.4 Ambulatory Status: Ambulatory Pulse (bpm): 76 Discharge Destination: Home Respiratory Rate (breaths/min): 20 Transportation: Private Auto Blood Pressure (mmHg): 163/84 Accompanied By: self Schedule Follow-up Appointment: Yes Clinical Summary of Care: Patient SENDER, RUEB ([id number]) [id number] Page 3 of 8 Electronic Signature(s) Signed: 06/02/2022 3:29:20 PM By: Redmond Pulling RN, BSN Entered By: Redmond Pulling on 06/02/2022 10:20:01 -------------------------------------------------------------------------------- Lower Extremity Assessment Details Patient Name: Date of Service: ROLLEN, SELDERS. 06/02/2022 9:00 A M Medical Record Number: [id number] Patient Account Number: [id number] Date of Birth/Sex: Treating RN: 1955-04-19  (67 y.o. Cline Cools Primary Care Thadius Smisek: Arva Chafe Other Clinician: Referring Marte Celani: Treating Jamari Diana/Extender: Vivien Rossetti in Treatment: 0 Edema Assessment Assessed: Kyra Searles: No] Franne Forts: Yes] [Left: Edema] [Right: :] Calf Left: Right: Point of Measurement: 30 cm From Medial Instep 38 cm Ankle Left: Right: Point of Measurement: 11 cm From Medial Instep 26 cm Knee To Floor Left: Right: From Medial Instep 40 cm Vascular Assessment Pulses: Dorsalis Pedis Palpable: [Left:Yes] [Right:Yes] Blood Pressure: Brachial: [Right:163] Ankle: [Right:Dorsalis Pedis: 160 0.98] Electronic Signature(s) Signed: 06/02/2022 3:29:20 PM By: Redmond Pulling RN, BSN Entered By: Redmond Pulling on 06/02/2022 09:49:13 -------------------------------------------------------------------------------- Multi Wound Chart Details Patient Name: Date of Service: [name]. 06/02/2022 9:00 A M Medical Record Number: [id number] Patient Account Number: [id number] Date of Birth/Sex: Treating RN: January 14, 1956 (67 y.o. M) Primary Care Mell Mellott: Arva Chafe Other Clinician: Referring Rochester Serpe: Treating Emsley Custer/Extender: Vivien Rossetti in Treatment: 0 Vital Signs Height(in): 71 Pulse(bpm): 76 Weight(lbs): 247 Blood Pressure(mmHg): 163/84 Body Mass Index(BMI): 34.4 Temperature(F): 98.4 Respiratory Rate(breaths/min): 20 [1:Photos:] [N/A:N/A] Right, Anterior Lower Leg N/A N/A Wound Location: Trauma N/A N/A Wounding Event: Abrasion N/A N/A Primary Etiology: 05/03/2022 N/A N/A Date Acquired: 0 N/A N/A Weeks of Treatment: Open N/A N/A Wound Status: No N/A N/A Wound Recurrence: 13x3.4x0.1 N/A N/A Measurements L x W x D (cm) 34.715 N/A N/A A (cm) : rea 3.471 N/A N/A Volume (cm) : Full Thickness Without Exposed N/A N/A Classification: Support Structures Medium N/A N/A Exudate A mount: Serosanguineous N/A  N/A Exudate Type: red, brown N/A N/A Exudate Color: Medium (34-66%) N/A N/A Granulation A mount: Red N/A N/A Granulation Quality: Medium (34-66%) N/A N/A Necrotic A mount: Eschar, Adherent Slough N/A N/A Necrotic Tissue: Fat Layer (Subcutaneous Tissue): Yes N/A N/A Exposed Structures: Fascia: No Tendon: No Muscle: No Joint: No Bone: No None N/A N/A Epithelialization: Debridement - Excisional N/A N/A Debridement: Pre-procedure Verification/Time Out 09:40 N/A N/A Taken: Lidocaine 4% Topical Solution N/A N/A Pain Control: Necrotic/Eschar, Subcutaneous, N/A N/A Tissue Debrided: Slough Skin/Subcutaneous Tissue N/A N/A Level: 34.7 N/A N/A Debridement A (sq cm): rea Curette N/A N/A Instrument: Minimum N/A N/A Bleeding: Pressure N/A N/A Hemostasis Achieved: 0 N/A N/A Procedural Pain: 0 N/A N/A Post Procedural Pain: Debridement Treatment Response: Procedure was tolerated well N/A N/A Post Debridement Measurements L x 13x3.4x0.1 N/A N/A W x D (cm) 3.471 N/A N/A Post Debridement  Volume: (cm) Debridement N/A N/A Procedures Performed: Treatment Notes Wound #1 (Lower Leg) Wound Laterality: Right, Anterior Cleanser Soap and Water Discharge Instruction: May shower and wash wound with dial antibacterial soap and water prior to dressing change. Vashe 5.8 (oz) Discharge Instruction: Cleanse the wound with Vashe prior to applying a clean dressing using gauze sponges, not tissue or cotton balls. Peri-Wound Care Sween Lotion (Moisturizing lotion) Discharge Instruction: Apply moisturizing lotion as directed Topical Primary Dressing IODOFLEX 0.9% Cadexomer Iodine Pad 4x6 cm Discharge Instruction: Apply to wound bed as instructed Secondary Dressing ABD Pad, 8x10 Discharge Instruction: Apply over primary dressing as directed. Secured With Compression Wrap Urgo K2 Lite, two layer compression system, regular Discharge Instruction: Apply Urgo K2 Lite as directed  (alternative to 3 layer compression). YANIV, LAGE ([id number]) [id number] Page 5 of 8 Surgilast Tubular Elastic Stretch Net Dressing Size 4 Compression Stockings Add-Ons Electronic Signature(s) Signed: 06/02/2022 10:49:24 AM By: Duanne Guess MD FACS Entered By: Duanne Guess on 06/02/2022 10:49:24 -------------------------------------------------------------------------------- Multi-Disciplinary Care Plan Details Patient Name: Date of Service: [name]. 06/02/2022 9:00 A M Medical Record Number: [id number] Patient Account Number: [id number] Date of Birth/Sex: Treating RN: [birth year] (67 y.o. Cline Cools Primary Care Venisa Frampton: Arva Chafe Other Clinician: Referring Janecia Palau: Treating Lurie Mullane/Extender: Vivien Rossetti in Treatment: 0 Active Inactive Orientation to the Wound Care Program Nursing Diagnoses: Knowledge deficit related to the wound healing center program Goals: Patient/caregiver will verbalize understanding of the Wound Healing Center Program Date Initiated: 06/02/2022 Target Resolution Date: 06/16/2022 Goal Status: Active Interventions: Provide education on orientation to the wound center Notes: Wound/Skin Impairment Nursing Diagnoses: Impaired tissue integrity Knowledge deficit related to ulceration/compromised skin integrity Goals: Patient/caregiver will verbalize understanding of skin care regimen Date Initiated: 06/02/2022 Target Resolution Date: 06/23/2022 Goal Status: Active Ulcer/skin breakdown will have a volume reduction of 30% by week 4 Date Initiated: 06/02/2022 Target Resolution Date: 06/30/2022 Goal Status: Active Interventions: Assess patient/caregiver ability to obtain necessary supplies Assess patient/caregiver ability to perform ulcer/skin care regimen upon admission and as needed Assess ulceration(s) every visit Provide education on ulcer and skin  care Notes: Electronic Signature(s) Signed: 06/02/2022 3:29:20 PM By: Redmond Pulling RN, BSN Entered By: Redmond Pulling on 06/02/2022 09:39:33 -------------------------------------------------------------------------------- Pain Assessment Details Patient Name: Date of Service: [name]. 06/02/2022 9:00 A M Medical Record Number: [id number] Patient Account Number: [id number] Date of Birth/Sex: Treating RN: June 01, 1955 (67 y.o. MALIEK, SCHELLHORN ([id number]) [id number] Page 6 of 8 Primary Care Charrisse Masley: Arva Chafe Other Clinician: Referring Kasten Leveque: Treating Mavi Un/Extender: Vivien Rossetti in Treatment: 0 Active Problems Location of Pain Severity and Description of Pain Patient Has Paino No Site Locations Pain Management and Medication Current Pain Management: Electronic Signature(s) Signed: 06/03/2022 9:07:30 AM By: Dayton Scrape Entered By: Dayton Scrape on 06/02/2022 08:54:03 -------------------------------------------------------------------------------- Patient/Caregiver Education Details Patient Name: Date of Service: Klabunde, RO BERT K. 4/25/2024andnbsp9:00 A M Medical Record Number: [id number] Patient Account Number: [id number] Date of Birth/Gender: Treating RN: 05/18/1955 (67 y.o. Cline Cools Primary Care Physician: Arva Chafe Other Clinician: Referring Physician: Treating Physician/Extender: Vivien Rossetti in Treatment: 0 Education Assessment Education Provided To: Patient Education Topics Provided Welcome T [facility]New Patient Packet: o Handouts: Welcome T [facility] o Methods: Printed Responses: State content correctly Wound/Skin Impairment: Handouts: Caring for Your Ulcer, Smoking and Wound Healing Methods: Printed Responses: State content correctly Electronic Signature(s) Signed: 06/02/2022 3:29:20 PM By: Redmond Pulling  RN, BSN Entered  By: Redmond Pulling on 06/02/2022 09:45:00 Fogel, Chales Salmon ([id number]) [id number] Page 7 of 8 -------------------------------------------------------------------------------- Wound Assessment Details Patient Name: Date of Service: JAFAR, POFFENBERGER 06/02/2022 9:00 A M Medical Record Number: [id number] Patient Account Number: [id number] Date of Birth/Sex: Treating RN: April 15, 1955 (67 y.o. M) Primary Care Cory Rama: Arva Chafe Other Clinician: Referring Karielle Davidow: Treating Khai Torbert/Extender: Vivien Rossetti in Treatment: 0 Wound Status Wound Number: 1 Primary Etiology: Abrasion Wound Location: Right, Anterior Lower Leg Wound Status: Open Wounding Event: Trauma Date Acquired: 05/03/2022 Weeks Of Treatment: 0 Clustered Wound: No Photos Wound Measurements Length: (cm) 13 Width: (cm) 3.4 Depth: (cm) 0.1 Area: (cm) 34.715 Volume: (cm) 3.471 % Reduction in Area: % Reduction in Volume: Epithelialization: None Tunneling: No Undermining: No Wound Description Classification: Full Thickness Without Exposed Support Structures Exudate Amount: Medium Exudate Type: Serosanguineous Exudate Color: red, brown Foul Odor After Cleansing: No Slough/Fibrino Yes Wound Bed Granulation Amount: Medium (34-66%) Exposed Structure Granulation Quality: Red Fascia Exposed: No Necrotic Amount: Medium (34-66%) Fat Layer (Subcutaneous Tissue) Exposed: Yes Necrotic Quality: Eschar, Adherent Slough Tendon Exposed: No Muscle Exposed: No Joint Exposed: No Bone Exposed: No Periwound Skin Texture Texture Color No Abnormalities Noted: No No Abnormalities Noted: No Moisture No Abnormalities Noted: No Treatment Notes Wound #1 (Lower Leg) Wound Laterality: Right, Anterior Cleanser Soap and Water Discharge Instruction: May shower and wash wound with dial antibacterial soap and water prior to dressing change. Vashe 5.8  (oz) Discharge Instruction: Cleanse the wound with Vashe prior to applying a clean dressing using gauze sponges, not tissue or cotton balls. Peri-Wound Care Sween Lotion (Moisturizing lotion) Discharge Instruction: Apply moisturizing lotion as directed UNIQUE, SEARFOSS ([id number]) [id number] Page 8 of 8 Topical Primary Dressing IODOFLEX 0.9% Cadexomer Iodine Pad 4x6 cm Discharge Instruction: Apply to wound bed as instructed Secondary Dressing ABD Pad, 8x10 Discharge Instruction: Apply over primary dressing as directed. Secured With Compression Wrap Urgo K2 Lite, two layer compression system, regular Discharge Instruction: Apply Urgo K2 Lite as directed (alternative to 3 layer compression). Surgilast Tubular Elastic Stretch Net Dressing Size 4 Compression Stockings Add-Ons Electronic Signature(s) Signed: 06/02/2022 3:29:20 PM By: Redmond Pulling RN, BSN Entered By: Redmond Pulling on 06/02/2022 09:10:15 -------------------------------------------------------------------------------- Vitals Details Patient Name: Date of Service: [name] 06/02/2022 9:00 A M Medical Record Number: [id number] Patient Account Number: [id number] Date of Birth/Sex: Treating RN: January 31, 1956 (67 y.o. M) Primary Care Catalina Salasar: Arva Chafe Other Clinician: Referring Endora Teresi: Treating Anayia Eugene/Extender: Vivien Rossetti in Treatment: 0 Vital Signs Time Taken: 08:47 Temperature (F): 98.4 Height (in): 71 Pulse (bpm): 76 Weight (lbs): 247 Respiratory Rate (breaths/min): 20 Body Mass Index (BMI): 34.4 Blood Pressure (mmHg): 163/84 Reference Range: 80 - 120 mg / dl Electronic Signature(s) Signed: 06/02/2022 3:29:20 PM By: Redmond Pulling RN, BSN Entered By: Redmond Pulling on 06/02/2022 09:07:31

## 2022-06-09 ENCOUNTER — Encounter (HOSPITAL_BASED_OUTPATIENT_CLINIC_OR_DEPARTMENT_OTHER): Payer: PPO | Attending: General Surgery | Admitting: General Surgery

## 2022-06-09 ENCOUNTER — Encounter (HOSPITAL_BASED_OUTPATIENT_CLINIC_OR_DEPARTMENT_OTHER): Payer: PPO | Admitting: Internal Medicine

## 2022-06-09 DIAGNOSIS — F17210 Nicotine dependence, cigarettes, uncomplicated: Secondary | ICD-10-CM | POA: Insufficient documentation

## 2022-06-09 DIAGNOSIS — I110 Hypertensive heart disease with heart failure: Secondary | ICD-10-CM | POA: Insufficient documentation

## 2022-06-09 DIAGNOSIS — J449 Chronic obstructive pulmonary disease, unspecified: Secondary | ICD-10-CM | POA: Diagnosis not present

## 2022-06-09 DIAGNOSIS — L97812 Non-pressure chronic ulcer of other part 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: Secondary | ICD-10-CM | POA: Diagnosis not present

## 2022-06-09 DIAGNOSIS — I5032 Chronic diastolic (congestive) heart failure: Secondary | ICD-10-CM | POA: Insufficient documentation

## 2022-06-09 DIAGNOSIS — S80811A Abrasion, right lower leg, initial encounter: Secondary | ICD-10-CM | POA: Diagnosis not present

## 2022-06-10 ENCOUNTER — Encounter: Payer: Self-pay | Admitting: Family Medicine

## 2022-06-10 NOTE — Progress Notes (Signed)
SHUBH, REYNAUD ([id number]) [id number] Page 1 of 7 Visit Report for 06/09/2022 Arrival Information Details Patient Name: Date of Service: Mitchell Parrish, Mitchell Parrish 06/09/2022 8:15 A M Medical Record Number: [id number] Patient Account Number: [id number] Date of Birth/Sex: Treating RN: 01-19-1956 (67 y.o. M) Primary Care Alyse Kathan: Arva Chafe Other Clinician: Referring Jerricka Carvey: Treating Idonia Zollinger/Extender: Vivien Rossetti in Treatment: 1 Visit Information History Since Last Visit All ordered tests and consults were completed: No Patient Arrived: Ambulatory Added or deleted any medications: No Arrival Time: 08:04 Any new allergies or adverse reactions: No Accompanied By: self Had a fall or experienced change in No Transfer Assistance: None activities of daily living that may affect Patient Identification Verified: Yes risk of falls: Secondary Verification Process Completed: Yes Signs or symptoms of abuse/neglect since last visito No Hospitalized since last visit: No Implantable device outside of the clinic excluding No cellular tissue based products placed in the center since last visit: Has Dressing in Place as Prescribed: Yes Has Compression in Place as Prescribed: Yes Pain Present Now: No Electronic Signature(Mitchell) Signed: 06/09/2022 2:21:56 PM By: Zenaida Deed RN, BSN Entered By: Zenaida Deed on 06/09/2022 08:34:35 -------------------------------------------------------------------------------- Compression Therapy Details Patient Name: Date of Service: Mitchell Savage K. 06/09/2022 8:15 A M Medical Record Number: [id number] Patient Account Number: [id number] Date of Birth/Sex: Treating RN: April 03, 1955 (67 y.o. [name] Primary Care Lorrie Gargan: Arva Chafe Other Clinician: Referring Deondra Labrador: Treating Infant Zink/Extender: Vivien Rossetti in Treatment: 1 Compression Therapy  Performed for Wound Assessment: Wound #1 Right,Anterior Lower Leg Performed By: Clinician Zenaida Deed, RN Compression Type: Double Layer Post Procedure Diagnosis Same as Pre-procedure Notes Stephannie Li Electronic Signature(Mitchell) Signed: 06/09/2022 2:21:56 PM By: Zenaida Deed RN, BSN Entered By: Zenaida Deed on 06/09/2022 08:51:36 -------------------------------------------------------------------------------- Encounter Discharge Information Details Patient Name: Date of Service: Mitchell Reno. 06/09/2022 8:15 A M Medical Record Number: [id number] Patient Account Number: [id number] Date of Birth/Sex: Treating RN: [birth year] (67 y.o. [name] Primary Care Suhayb Anzalone: Arva Chafe Other Clinician: Referring Dajiah Kooi: Treating Illias Pantano/Extender: Vivien Rossetti in Treatment: 1 Encounter Discharge Information Items Post Procedure Vitals Mitchell Parrish, Mitchell Parrish ([id number]) [id number] Page 2 of 7 Discharge Condition: Stable Temperature (F): 98.4 Ambulatory Status: Ambulatory Pulse (bpm): 75 Discharge Destination: Home Respiratory Rate (breaths/min): 18 Transportation: Private Auto Blood Pressure (mmHg): 144/83 Accompanied By: self Schedule Follow-up Appointment: Yes Clinical Summary of Care: Patient Declined Electronic Signature(Mitchell) Signed: 06/09/2022 2:21:56 PM By: Zenaida Deed RN, BSN Entered By: Zenaida Deed on 06/09/2022 09:11:00 -------------------------------------------------------------------------------- Lower Extremity Assessment Details Patient Name: Date of Service: Mitchell Reno. 06/09/2022 8:15 A M Medical Record Number: [id number] Patient Account Number: [id number] Date of Birth/Sex: Treating RN: Oct 29, 1955 (67 y.o. [name] Primary Care Randen Kauth: Arva Chafe Other Clinician: Referring Rosamary Boudreau: Treating Marcella Dunnaway/Extender: Vivien Rossetti in  Treatment: 1 Edema Assessment Assessed: [Left: No] [Right: No] Edema: [Left: Ye] [Right: Mitchell] Calf Left: Right: Point of Measurement: 30 cm From Medial Instep 38 cm Ankle Left: Right: Point of Measurement: 11 cm From Medial Instep 25.5 cm Vascular Assessment Pulses: Dorsalis Pedis Palpable: [Right:Yes] Electronic Signature(Mitchell) Signed: 06/09/2022 2:21:56 PM By: Zenaida Deed RN, BSN Entered By: Zenaida Deed on 06/09/2022 08:37:34 -------------------------------------------------------------------------------- Multi Wound Chart Details Patient Name: Date of Service: Mitchell Reno. 06/09/2022 8:15 A M Medical Record Number: [id number] Patient Account Number: [id number] Date of Birth/Sex: Treating RN: [birth year] (67 y.o. M) Primary Care Devereaux Grayson: Arva Chafe Other Clinician: Referring Kona Lover: Treating Denielle Bayard/Extender: Lady Gary  Ruby Cola, Mitchell Parrish in Treatment: 1 Vital Signs Height(in): 71 Pulse(bpm): 75 Weight(lbs): 247 Blood Pressure(mmHg): 144/83 Body Mass Index(BMI): 34.4 Temperature(F): 98.4 Respiratory Rate(breaths/min): 20 [1:Photos:] [N/A:N/A] Right, Anterior Lower Leg N/A N/A Wound Location: Trauma N/A N/A Wounding Event: Abrasion N/A N/A Primary Etiology: Chronic Obstructive Pulmonary N/A N/A Comorbid History: Disease (COPD), Sleep Apnea, Arrhythmia, Congestive Heart Failure, Hypertension, Osteoarthritis 05/03/2022 N/A N/A Date Acquired: 1 N/A N/A Parrish of Treatment: Open N/A N/A Wound Status: No N/A N/A Wound Recurrence: 13.6x3.1x0.1 N/A N/A Measurements L x W x D (cm) 33.112 N/A N/A A (cm) : rea 3.311 N/A N/A Volume (cm) : 4.60% N/A N/A % Reduction in A rea: 4.60% N/A N/A % Reduction in Volume: Full Thickness Without Exposed N/A N/A Classification: Support Structures Medium N/A N/A Exudate A mount: Serosanguineous N/A N/A Exudate Type: red, brown N/A N/A Exudate Color: Flat and Intact N/A N/A Wound  Margin: Small (1-33%) N/A N/A Granulation A mount: Red N/A N/A Granulation Quality: Large (67-100%) N/A N/A Necrotic A mount: Eschar, Adherent Slough N/A N/A Necrotic Tissue: Fat Layer (Subcutaneous Tissue): Yes N/A N/A Exposed Structures: Fascia: No Tendon: No Muscle: No Joint: No Bone: No None N/A N/A Epithelialization: Debridement - Excisional N/A N/A Debridement: Pre-procedure Verification/Time Out 08:45 N/A N/A Taken: Lidocaine 4% Topical Solution N/A N/A Pain Control: Necrotic/Eschar, Subcutaneous, N/A N/A Tissue Debrided: Slough Skin/Subcutaneous Tissue N/A N/A Level: 33.1 N/A N/A Debridement A (sq cm): rea Curette N/A N/A Instrument: Minimum N/A N/A Bleeding: Pressure N/A N/A Hemostasis Achieved: 4 N/A N/A Procedural Pain: 2 N/A N/A Post Procedural Pain: Debridement Treatment Response: Procedure was tolerated well N/A N/A Post Debridement Measurements L x 13.6x3.1x0.1 N/A N/A W x D (cm) 3.311 N/A N/A Post Debridement Volume: (cm) No Abnormalities Noted N/A N/A Periwound Skin Texture: No Abnormalities Noted N/A N/A Periwound Skin Moisture: No Abnormalities Noted N/A N/A Periwound Skin Color: No Abnormality N/A N/A Temperature: Yes N/A N/A Tenderness on Palpation: Compression Therapy N/A N/A Procedures Performed: Debridement Treatment Notes Wound #1 (Lower Leg) Wound Laterality: Right, Anterior Cleanser Soap and Water Discharge Instruction: May shower and wash wound with dial antibacterial soap and water prior to dressing change. Vashe 5.8 (oz) Discharge Instruction: Cleanse the wound with Vashe prior to applying a clean dressing using gauze sponges, not tissue or cotton balls. Peri-Wound Care Sween Lotion (Moisturizing lotion) Discharge Instruction: Apply moisturizing lotion as directed Topical Primary Dressing IODOFLEX 0.9% Cadexomer Iodine Pad 4x6 cm STODDARD, Parrish ([id number]) [phone].pdf Page 4 of  7 Discharge Instruction: Apply to wound bed as instructed Secondary Dressing ABD Pad, 8x10 Discharge Instruction: Apply over primary dressing as directed. Secured With Compression Wrap Urgo K2 Lite, two layer compression system, regular Discharge Instruction: Apply Urgo K2 Lite as directed (alternative to 3 layer compression). Surgilast Tubular Elastic Stretch Net Dressing Size 4 Compression Stockings Add-Ons Electronic Signature(Mitchell) Signed: 06/09/2022 9:13:35 AM By: Duanne Guess MD FACS Entered By: Duanne Guess on 06/09/2022 09:13:35 -------------------------------------------------------------------------------- Multi-Disciplinary Care Plan Details Patient Name: Date of Service: Mitchell Reno. 06/09/2022 8:15 A M Medical Record Number: [id number] Patient Account Number: [id number] Date of Birth/Sex: Treating RN: 09-Jun-1955 (67 y.o. [name] Primary Care Sinan Tuch: Arva Chafe Other Clinician: Referring Wilhelmenia Addis: Treating Conn Trombetta/Extender: Vivien Rossetti in Treatment: 1 Multidisciplinary Care Plan reviewed with physician Active Inactive Wound/Skin Impairment Nursing Diagnoses: Impaired tissue integrity Knowledge deficit related to ulceration/compromised skin integrity Goals: Patient/caregiver will verbalize understanding of skin care regimen Date Initiated: 06/02/2022 Target Resolution Date: 06/23/2022 Goal Status:  Active Ulcer/skin breakdown will have a volume reduction of 30% by week 4 Date Initiated: 06/02/2022 Target Resolution Date: 06/30/2022 Goal Status: Active Interventions: Assess patient/caregiver ability to obtain necessary supplies Assess patient/caregiver ability to perform ulcer/skin care regimen upon admission and as needed Assess ulceration(Mitchell) every visit Provide education on ulcer and skin care Notes: Electronic Signature(Mitchell) Signed: 06/09/2022 2:21:56 PM By: Zenaida Deed RN, BSN Entered By: Zenaida Deed on 06/09/2022 08:42:10 -------------------------------------------------------------------------------- Pain Assessment Details Patient Name: Date of Service: Mitchell Savage K. 06/09/2022 8:15 A M Medical Record Number: [id number] Patient Account Number: [id number] Date of Birth/Sex: Treating RN: 1955-04-18 (67 y.o. M) Primary Care Lasalle Abee: Arva Chafe Other Clinician: ASHOK, Mitchell Parrish ([id number]) [id number] Page 5 of 7 Referring Ulys Favia: Treating Khaza Blansett/Extender: Vivien Rossetti in Treatment: 1 Active Problems Location of Pain Severity and Description of Pain Patient Has Paino No Site Locations With Dressing Change: Yes Duration of the Pain. Constant / Intermittento Intermittent Rate the pain. Current Pain Level: 0 Worst Pain Level: 3 Character of Pain Describe the Pain: Other: stinging Pain Management and Medication Current Pain Management: Other: tolerable Is the Current Pain Management Adequate: Adequate How does your wound impact your activities of daily livingo Sleep: No Bathing: No Appetite: No Relationship With Others: No Bladder Continence: No Emotions: No Bowel Continence: No Work: No Toileting: No Drive: No Dressing: No Hobbies: No Psychologist, prison and probation services) Signed: 06/09/2022 2:21:56 PM By: Zenaida Deed RN, BSN Entered By: Zenaida Deed on 06/09/2022 08:35:14 -------------------------------------------------------------------------------- Patient/Caregiver Education Details Patient Name: Date of Service: Mitchell Reno 5/2/2024andnbsp8:15 A M Medical Record Number: [id number] Patient Account Number: [id number] Date of Birth/Gender: Treating RN: 04/01/1955 (67 y.o. [name] Primary Care Physician: Arva Chafe Other Clinician: Referring Physician: Treating Physician/Extender: Vivien Rossetti in Treatment: 1 Education  Assessment Education Provided To: Patient Education Topics Provided Venous: Methods: Explain/Verbal Responses: Reinforcements needed, State content correctly Wound Debridement: Methods: Explain/Verbal Responses: Reinforcements needed, State content correctly Mitchell Parrish, Mitchell Parrish ([id number]) [phone].pdf Page 6 of 7 Wound/Skin Impairment: Methods: Explain/Verbal Responses: Reinforcements needed, State content correctly Electronic Signature(Mitchell) Signed: 06/09/2022 2:21:56 PM By: Zenaida Deed RN, BSN Entered By: Zenaida Deed on 06/09/2022 08:42:42 -------------------------------------------------------------------------------- Wound Assessment Details Patient Name: Date of Service: Mitchell Savage K. 06/09/2022 8:15 A M Medical Record Number: [id number] Patient Account Number: [id number] Date of Birth/Sex: Treating RN: 29-Aug-1955 (68 y.o. [name] Primary Care Chalonda Schlatter: Arva Chafe Other Clinician: Referring Edgerrin Correia: Treating Gryphon Vanderveen/Extender: Vivien Rossetti in Treatment: 1 Wound Status Wound Number: 1 Primary Abrasion Etiology: Wound Location: Right, Anterior Lower Leg Wound Open Wounding Event: Trauma Status: Date Acquired: 05/03/2022 Comorbid Chronic Obstructive Pulmonary Disease (COPD), Sleep Apnea, Parrish Of Treatment: 1 History: Arrhythmia, Congestive Heart Failure, Hypertension, Osteoarthritis Clustered Wound: No Photos Wound Measurements Length: (cm) 13.6 Width: (cm) 3.1 Depth: (cm) 0.1 Area: (cm) 33.112 Volume: (cm) 3.311 % Reduction in Area: 4.6% % Reduction in Volume: 4.6% Epithelialization: None Tunneling: No Undermining: No Wound Description Classification: Full Thickness Without Exposed Support Structures Wound Margin: Flat and Intact Exudate Amount: Medium Exudate Type: Serosanguineous Exudate Color: red, brown Foul Odor After Cleansing: No Slough/Fibrino Yes Wound  Bed Granulation Amount: Small (1-33%) Exposed Structure Granulation Quality: Red Fascia Exposed: No Necrotic Amount: Large (67-100%) Fat Layer (Subcutaneous Tissue) Exposed: Yes Necrotic Quality: Eschar, Adherent Slough Tendon Exposed: No Muscle Exposed: No Joint Exposed: No Bone Exposed: No Periwound Skin Texture Texture Color No Abnormalities Noted: Yes No Abnormalities Noted: Yes Moisture Temperature / Pain  No Abnormalities Noted: Yes Temperature: No Abnormality Tenderness on Palpation: Mitchell Parrish, Mitchell Parrish ([id number]) [id number] Page 7 of 7 Treatment Notes Wound #1 (Lower Leg) Wound Laterality: Right, Anterior Cleanser Soap and Water Discharge Instruction: May shower and wash wound with dial antibacterial soap and water prior to dressing change. Vashe 5.8 (oz) Discharge Instruction: Cleanse the wound with Vashe prior to applying a clean dressing using gauze sponges, not tissue or cotton balls. Peri-Wound Care Sween Lotion (Moisturizing lotion) Discharge Instruction: Apply moisturizing lotion as directed Topical Primary Dressing IODOFLEX 0.9% Cadexomer Iodine Pad 4x6 cm Discharge Instruction: Apply to wound bed as instructed Secondary Dressing ABD Pad, 8x10 Discharge Instruction: Apply over primary dressing as directed. Secured With Compression Wrap Urgo K2 Lite, two layer compression system, regular Discharge Instruction: Apply Urgo K2 Lite as directed (alternative to 3 layer compression). Surgilast Tubular Elastic Stretch Net Dressing Size 4 Compression Stockings Add-Ons Electronic Signature(Mitchell) Signed: 06/09/2022 2:21:56 PM By: Zenaida Deed RN, BSN Entered By: Zenaida Deed on 06/09/2022 08:41:27 -------------------------------------------------------------------------------- Vitals Details Patient Name: Date of Service: Mitchell Savage K. 06/09/2022 8:15 A M Medical Record Number: [id number] Patient Account Number:  [id number] Date of Birth/Sex: Treating RN: 24-Sep-1955 (67 y.o. M) Primary Care Joshuah Minella: Arva Chafe Other Clinician: Referring Hyun Reali: Treating Keta [place]/Extender: Vivien Rossetti in Treatment: 1 Vital Signs Time Taken: 08:04 Temperature (F): 98.4 Height (in): 71 Pulse (bpm): 75 Weight (lbs): 247 Respiratory Rate (breaths/min): 20 Body Mass Index (BMI): 34.4 Blood Pressure (mmHg): 144/83 Reference Range: 80 - 120 mg / dl Electronic Signature(Mitchell) Signed: 06/09/2022 2:21:56 PM By: Zenaida Deed RN, BSN Entered By: Zenaida Deed on 06/09/2022 08:34:41

## 2022-06-10 NOTE — Progress Notes (Signed)
KHOBE, ELLENBECKER ([id number]) [id number] Page 1 of 8 Visit Report for 06/09/2022 Chief Complaint Document Details Patient Name: Date of Service: Mitchell Parrish, Mitchell Parrish 06/09/2022 8:15 A M Medical Record Number: [id number] Patient Account Number: [id number] Date of Birth/Sex: Treating RN: 30-Jul-1955 (67 y.o. M) Primary Care Provider: Arva Parrish Other Clinician: Referring Provider: Treating Provider/Extender: Mitchell Parrish in Treatment: 1 Information Obtained from: Patient Chief Complaint Patient seen for complaints of Non-Healing Wound. Electronic Signature(s) Signed: 06/09/2022 9:13:41 AM By: Mitchell Guess MD FACS Entered By: Mitchell Parrish on 06/09/2022 09:13:41 -------------------------------------------------------------------------------- Debridement Details Patient Name: Date of Service: Mitchell Parrish. 06/09/2022 8:15 A M Medical Record Number: [id number] Patient Account Number: [id number] Date of Birth/Sex: Treating RN: [birth year] (67 y.o. Mitchell Parrish Primary Care Provider: Arva Parrish Other Clinician: Referring Provider: Treating Provider/Extender: Mitchell Parrish in Treatment: 1 Debridement Performed for Assessment: Wound #1 Right,Anterior Lower Leg Performed By: Physician Mitchell Guess, MD Debridement Type: Debridement Level of Consciousness (Pre-procedure): Awake and Alert Pre-procedure Verification/Time Out Yes - 08:45 Taken: Start Time: 08:48 Pain Control: Lidocaine 4% T opical Solution Percent of Wound Bed Debrided: 100% T Area Debrided (cm): otal 33.1 Tissue and other material debrided: Viable, Non-Viable, Eschar, Slough, Subcutaneous, Slough Level: Skin/Subcutaneous Tissue Debridement Description: Excisional Instrument: Curette Bleeding: Minimum Hemostasis Achieved: Pressure Procedural Pain: 4 Post Procedural Pain: 2 Response to Treatment:  Procedure was tolerated well Level of Consciousness (Post- Awake and Alert procedure): Post Debridement Measurements of Total Wound Length: (cm) 13.6 Width: (cm) 3.1 Depth: (cm) 0.1 Volume: (cm) 3.311 Character of Wound/Ulcer Post Debridement: Improved Post Procedure Diagnosis Same as Pre-procedure Notes Scribed for Dr Lady Gary by Zenaida Deed, RN Electronic Signature(s) Signed: 06/09/2022 12:51:59 PM By: Mitchell Guess MD FACS Signed: 06/09/2022 2:21:56 PM By: Zenaida Deed RN, BSN [name], [name] ([id number]) [id number] Page 2 of 8 Entered By: Zenaida Deed on 06/09/2022 08:51:02 -------------------------------------------------------------------------------- HPI Details Patient Name: Date of Service: Mitchell Parrish, Mitchell Parrish 06/09/2022 8:15 A M Medical Record Number: [id number] Patient Account Number: [id number] Date of Birth/Sex: Treating RN: December 08, 1955 (67 y.o. M) Primary Care Provider: Arva Parrish Other Clinician: Referring Provider: Treating Provider/Extender: Mitchell Parrish in Treatment: 1 History of Present Illness HPI Description: ADMISSION 06/02/2022 This is a 67 year old none diabetic with a fairly extensive cardiac history who was mowing his lawn on May 02, 2021 when he went into a branch that is partially broken off telemetry. It lacerated his leg and he presented to the emergency department for further evaluation and management. The wound was sutured and he was given a course of Keflex. He returned to the ED a little more than a week later with increased swelling and erythema. He was given cefdinir and doxycycline. He has since been treating it by applying Xeroform to the site. He does smoke, admitting to half a pack per day. He is not diabetic. ABI in clinic today 0.98. The sutures have been removed. There is a longitudinal wound on his anterior tibial surface. There is slough and nonviable skin on the  surface. It appears to have filled in considerably since the time of the initial injury when comparing to the photographs in the electronic medical record. There is no periwound erythema. He does have 1+ pitting edema. 06/09/2022: The Iodoflex did a very good job of softening up the eschar and slough. There is more granulation tissue emerging underneath this. His edema control is much improved. Electronic Signature(s) Signed: 06/09/2022 9:14:17 AM By: Mitchell Guess  MD FACS Entered By: Mitchell Parrish on 06/09/2022 09:14:17 -------------------------------------------------------------------------------- Physical Exam Details Patient Name: Date of Service: Mitchell Parrish, Mitchell Parrish 06/09/2022 8:15 A M Medical Record Number: [id number] Patient Account Number: [id number] Date of Birth/Sex: Treating RN: 1956/01/26 (67 y.o. M) Primary Care Provider: Arva Parrish Other Clinician: Referring Provider: Treating Provider/Extender: Mitchell Parrish in Treatment: 1 Constitutional Slightly hypertensive. . . . Marland Kitchen Respiratory Normal work of breathing on room air. Notes 06/09/2022: The Iodoflex did a very good job of softening up the eschar and slough. There is more granulation tissue emerging underneath this. His edema control is much improved. Electronic Signature(s) Signed: 06/09/2022 9:14:42 AM By: Mitchell Guess MD FACS Entered By: Mitchell Parrish on 06/09/2022 09:14:42 -------------------------------------------------------------------------------- Physician Orders Details Patient Name: Date of Service: Mitchell Savage K. 06/09/2022 8:15 A M Medical Record Number: [id number] Patient Account Number: [id number] Date of Birth/Sex: Treating RN: 04-03-1955 (67 y.o. Mitchell Parrish Primary Care Provider: Arva Parrish Other Clinician: Referring Provider: Treating Provider/Extender: Mitchell Parrish in Treatment: 1 Verbal / Phone Orders:  No Diagnosis [address] [name], [name] ([id number]) [id number] Page 3 of 8 ICD-10 Coding Code Description [phone] Non-pressure chronic ulcer of other part 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 I50.32 Chronic diastolic (congestive) heart failure J44.9 Chronic obstructive pulmonary disease, unspecified I10 Essential (primary) hypertension Z72.0 Tobacco use Follow-up Appointments ppointment in 1 week. - Dr Lady Gary - room 1 Return A Thursday May 2 @ 2:45 pm Anesthetic Wound #1 Right,Anterior Lower Leg (In clinic) Topical Lidocaine 4% applied to wound bed Bathing/ Shower/ Hygiene May shower with protection but do not get wound dressing(s) wet. Protect dressing(s) with water repellant cover (for example, large plastic bag) or a cast cover and may then take shower. Edema Control - Lymphedema / SCD / Other Right Lower Extremity Elevate legs to the level of the heart or above for 30 minutes daily and/or when sitting for 3-4 times a day throughout the day. Avoid standing for long periods of time. Exercise regularly Wound Treatment Wound #1 - Lower Leg Wound Laterality: Right, Anterior Cleanser: Soap and Water 1 x Per Week/30 Days Discharge Instructions: May shower and wash wound with dial antibacterial soap and water prior to dressing change. Cleanser: Vashe 5.8 (oz) 1 x Per Week/30 Days Discharge Instructions: Cleanse the wound with Vashe prior to applying a clean dressing using gauze sponges, not tissue or cotton balls. Peri-Wound Care: Sween Lotion (Moisturizing lotion) 1 x Per Week/30 Days Discharge Instructions: Apply moisturizing lotion as directed Prim Dressing: IODOFLEX 0.9% Cadexomer Iodine Pad 4x6 cm 1 x Per Week/30 Days ary Discharge Instructions: Apply to wound bed as instructed Secondary Dressing: ABD Pad, 8x10 1 x Per Week/30 Days Discharge Instructions: Apply over primary dressing as directed. Compression Wrap: Urgo K2 Lite, two layer  compression system, regular 1 x Per Week/30 Days Discharge Instructions: Apply Urgo K2 Lite as directed (alternative to 3 layer compression). Compression Wrap: Surgilast Tubular Elastic Stretch Net Dressing Size 4 1 x Per Week/30 Days Patient Medications llergies: etodolac A Notifications Medication Indication Start End prior to debridement 06/09/2022 lidocaine DOSE topical 4 % cream - cream topical Electronic Signature(s) Signed: 06/09/2022 12:51:59 PM By: Mitchell Guess MD FACS Entered By: Mitchell Parrish on 06/09/2022 09:14:49 -------------------------------------------------------------------------------- Problem List Details Patient Name: Date of Service: Mitchell Savage K. 06/09/2022 8:15 A M Medical Record Number: [id number] Patient Account Number: [id number] Date of Birth/Sex: Treating RN: 1956-02-07 (67 y.o. Mitchell Parrish Primary Care Provider: Carmelia Roller,  Janyth Pupa Other Clinician: Referring Provider: Treating Provider/Extender: Mitchell Parrish in Treatment: 1 MITCHEAL, RABBITT ([id number]) [id number] Page 4 of 8 Active Problems ICD-10 Encounter Code Description Active Date MDM Diagnosis L97.812 Non-pressure chronic ulcer of other part of right lower leg with fat layer 06/02/2022 No Yes exposed I50.32 Chronic diastolic (congestive) heart failure 06/02/2022 No Yes J44.9 Chronic obstructive pulmonary disease, unspecified 06/02/2022 No Yes I10 Essential (primary) hypertension 06/02/2022 No Yes Z72.0 Tobacco use 06/02/2022 No Yes Inactive Problems Resolved Problems Electronic Signature(s) Signed: 06/09/2022 9:13:30 AM By: Mitchell Guess MD FACS Entered By: Mitchell Parrish on 06/09/2022 09:13:30 -------------------------------------------------------------------------------- Progress Note Details Patient Name: Date of Service: Mitchell Savage K. 06/09/2022 8:15 A M Medical Record Number: [id number] Patient Account  Number: [id number] Date of Birth/Sex: Treating RN: [birth year] (67 y.o. M) Primary Care Provider: Arva Parrish Other Clinician: Referring Provider: Treating Provider/Extender: Mitchell Parrish in Treatment: 1 Subjective Chief Complaint Information obtained from Patient Patient seen for complaints of Non-Healing Wound. History of Present Illness (HPI) ADMISSION 06/02/2022 This is a 67 year old none diabetic with a fairly extensive cardiac history who was mowing his lawn on May 02, 2021 when he went into a branch that is partially broken off telemetry. It lacerated his leg and he presented to the emergency department for further evaluation and management. The wound was sutured and he was given a course of Keflex. He returned to the ED a little more than a week later with increased swelling and erythema. He was given cefdinir and doxycycline. He has since been treating it by applying Xeroform to the site. He does smoke, admitting to half a pack per day. He is not diabetic. ABI in clinic today 0.98. The sutures have been removed. There is a longitudinal wound on his anterior tibial surface. There is slough and nonviable skin on the surface. It appears to have filled in considerably since the time of the initial injury when comparing to the photographs in the electronic medical record. There is no periwound erythema. He does have 1+ pitting edema. 06/09/2022: The Iodoflex did a very good job of softening up the eschar and slough. There is more granulation tissue emerging underneath this. His edema control is much improved. Patient History Information obtained from Patient. Family History Cancer - Mother,Father, Diabetes - Father, Heart Disease - Father, Hypertension - Father, Lung Disease - Mother, Stroke - Father, Tuberculosis - Paternal Grandparents, No family history of Kidney Disease, Seizures, Thyroid Problems. BAUER, MALINSKY ([id number])  [id number] Page 5 of 8 Social History Current every day smoker - 1/2 pack day 20+ years, Marital Status - Married, Alcohol Use - Moderate, Drug Use - No History, Caffeine Use - Daily. Medical History Respiratory Patient has history of Chronic Obstructive Pulmonary Disease (COPD), Sleep Apnea Cardiovascular Patient has history of Arrhythmia, Congestive Heart Failure, Hypertension Musculoskeletal Patient has history of Osteoarthritis Medical A Surgical History Notes nd Ear/Nose/Mouth/Throat seasonal allergies Objective Constitutional Slightly hypertensive. Vitals Time Taken: 8:04 AM, Height: 71 in, Weight: 247 lbs, BMI: 34.4, Temperature: 98.4 F, Pulse: 75 bpm, Respiratory Rate: 20 breaths/min, Blood Pressure: 144/83 mmHg. Respiratory Normal work of breathing on room air. General Notes: 06/09/2022: The Iodoflex did a very good job of softening up the eschar and slough. There is more granulation tissue emerging underneath this. His edema control is much improved. Integumentary (Hair, Skin) Wound #1 status is Open. Original cause of wound was Trauma. The date acquired was: 05/03/2022. The wound has been in treatment 1 weeks. The  wound is located on the Right,Anterior Lower Leg. The wound measures 13.6cm length x 3.1cm width x 0.1cm depth; 33.112cm^2 area and 3.311cm^3 volume. There is Fat Layer (Subcutaneous Tissue) exposed. There is no tunneling or undermining noted. There is a medium amount of serosanguineous drainage noted. The wound margin is flat and intact. There is small (1-33%) red granulation within the wound bed. There is a large (67-100%) amount of necrotic tissue within the wound bed including Eschar and Adherent Slough. The periwound skin appearance had no abnormalities noted for texture. The periwound skin appearance had no abnormalities noted for moisture. The periwound skin appearance had no abnormalities noted for color. Periwound temperature was  noted as No Abnormality. The periwound has tenderness on palpation. 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 There was a Excisional Skin/Subcutaneous Tissue Debridement with a total area of 33.1 sq cm performed by Mitchell Guess, MD. With the following instrument(s): Curette to remove Viable and Non-Viable tissue/material. Material removed includes Eschar, Subcutaneous Tissue, and Slough after achieving pain control using Lidocaine 4% T opical Solution. No specimens were taken. A time out was conducted at 08:45, prior to the start of the procedure. A Minimum amount of bleeding was controlled with Pressure. The procedure was tolerated well with a pain level of 4 throughout and a pain level of 2 following the procedure. Post Debridement Measurements: 13.6cm length x 3.1cm width x 0.1cm depth; 3.311cm^3 volume. Character of Wound/Ulcer Post Debridement is improved. Post procedure Diagnosis Wound #1: Same as Pre-Procedure General Notes: Scribed for Dr Lady Gary by Zenaida Deed, RN. Pre-procedure diagnosis of Wound #1 is an Abrasion located on the Right,Anterior Lower Leg . There was a Double Layer Compression Therapy Procedure by Zenaida Deed, RN. Post procedure Diagnosis Wound #1: Same as Pre-Procedure Notes: urgo lite. Mitchell Parrish, Mitchell Parrish ([id number]) [id number] Page 6 of 8 Plan Follow-up Appointments: Return Appointment in 1 week. - Dr Lady Gary - room 1 Thursday May 2 @ 2:45 pm Anesthetic: Wound #1 Right,Anterior Lower Leg: (In clinic) Topical Lidocaine 4% applied to wound bed Bathing/ Shower/ Hygiene: May shower with protection but  do not get wound dressing(s) wet. Protect dressing(s) with water repellant cover (for example, large plastic bag) or a cast cover and may then take shower. Edema Control - Lymphedema / SCD / Other: Elevate legs to the level of the heart or above for 30 minutes daily and/or when sitting for 3-4 times a day throughout the day. Avoid standing for long periods of time. Exercise regularly The following medication(s) was prescribed: lidocaine topical 4 % cream cream topical for prior to debridement was prescribed at facility WOUND #1: - Lower Leg Wound Laterality: Right, Anterior Cleanser: Soap and Water 1 x Per Week/30 Days Discharge Instructions: May shower and wash wound with dial antibacterial soap and water prior to dressing change. Cleanser: Vashe 5.8 (oz) 1 x Per Week/30 Days Discharge Instructions: Cleanse the wound with Vashe prior to applying a clean dressing using gauze sponges, not tissue or cotton balls. Peri-Wound Care: Sween Lotion (Moisturizing lotion) 1 x Per Week/30 Days Discharge Instructions: Apply moisturizing lotion as directed Prim Dressing: IODOFLEX 0.9% Cadexomer Iodine Pad 4x6 cm 1 x Per Week/30 Days ary Discharge Instructions: Apply to wound bed as instructed Secondary Dressing: ABD  Pad, 8x10 1 x Per Week/30 Days Discharge Instructions: Apply over primary dressing as directed. Com pression Wrap: Urgo K2 Lite, two layer compression system, regular 1 x Per Week/30 Days Discharge Instructions: Apply Urgo K2 Lite as directed (alternative to 3 layer compression). Com pression Wrap: Surgilast Tubular Elastic Stretch Net Dressing Size 4 1 x Per Week/30 Days 06/09/2022: The Iodoflex did a very good job of softening up the eschar and slough. There is more granulation tissue emerging underneath this. His edema control is much improved. I used a curette to debride slough, eschar, and subcutaneous tissue from the wound. I think 1 more week of Iodoflex for further chemical debridement  will be beneficial. We will continue this with 3 layer compression. Follow-up in 1 week. Electronic Signature(s) Signed: 06/09/2022 9:15:21 AM By: Mitchell Guess MD FACS Entered By: Mitchell Parrish on 06/09/2022 09:15:21 -------------------------------------------------------------------------------- HxROS Details Patient Name: Date of Service: Mitchell Parrish, Mitchell BERT K. 06/09/2022 8:15 A M Medical Record Number: [id number] Patient Account Number: [id number] Date of Birth/Sex: Treating RN: 03/02/1955 (67 y.o. M) Primary Care Provider: Arva Parrish Other Clinician: Referring Provider: Treating Provider/Extender: Mitchell Parrish in Treatment: 1 Information Obtained From Patient Ear/Nose/Mouth/Throat Medical History: Past Medical History Notes: seasonal allergies Respiratory Medical History: Positive for: Chronic Obstructive Pulmonary Disease (COPD); Sleep Apnea Cardiovascular Medical History: Positive for: Arrhythmia; Congestive Heart Failure; Hypertension Musculoskeletal Mitchell Parrish, Mitchell Parrish ([id number]) [id number] Page 7 of 8 Medical History: Positive for: Osteoarthritis Immunizations Pneumococcal Vaccine: Received Pneumococcal Vaccination: Yes Received Pneumococcal Vaccination On or After 60th Birthday: Yes Implantable Devices None Family and Social History Cancer: Yes - Mother,Father; Diabetes: Yes - Father; Heart Disease: Yes - Father; Hypertension: Yes - Father; Kidney Disease: No; Lung Disease: Yes - Mother; Seizures: No; Stroke: Yes - Father; Thyroid Problems: No; Tuberculosis: Yes - Paternal Grandparents; Current every day smoker - 1/2 pack day 20+ years; Marital Status - Married; Alcohol Use: Moderate; Drug Use: No History; Caffeine Use: Daily; Financial Concerns: No; Food, Clothing or Shelter Needs: No; Support System Lacking: No; Transportation Concerns: No Electronic Signature(s) Signed: 06/09/2022 12:51:59 PM By:  Mitchell Guess MD FACS Entered By: Mitchell Parrish on 06/09/2022 09:14:23 -------------------------------------------------------------------------------- SuperBill Details Patient Name: Date of Service: Mitchell Parrish. 06/09/2022 Medical Record Number: [id number] Patient Account Number: [id number] Date of Birth/Sex: Treating RN: [birth year] (67 y.o. M) Primary Care Provider: Arva Parrish Other Clinician: Referring Provider: Treating Provider/Extender: Mitchell Parrish in Treatment: 1 Diagnosis Coding ICD-10 Codes Code Description [phone] Non-pressure chronic ulcer of other part 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 I50.32 Chronic diastolic (congestive) heart failure J44.9 Chronic obstructive pulmonary disease, unspecified I10 Essential (primary) hypertension Z72.0 Tobacco use Facility Procedures : CPT4 Code: [id number] Description: 11042 - DEB SUBQ TISSUE 20 SQ CM/< ICD-10 Diagnosis Description L97.812 Non-pressure chronic ulcer of other part of right lower leg with fat layer exp Modifier: osed Quantity: 1 : CPT4 Code: [id number] Description: 11045 - DEB SUBQ TISS EA ADDL 20CM ICD-10 Diagnosis Description L97.812 Non-pressure chronic ulcer of other part of right lower leg with fat layer exp Modifier: osed Quantity: 1 Physician Procedures : CPT4 Code Description Modifier [id number] 99213 - WC PHYS LEVEL 3 - EST PT 25 ICD-10 Diagnosis Description L97.812 Non-pressure chronic ulcer of other part 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 I50.32 Chronic diastolic (congestive) heart failure J44.9  Chronic obstructive pulmonary disease, unspecified Z72.0 Tobacco use Quantity: 1 : [id number] 11042 - WC PHYS SUBQ TISS 20 SQ CM ICD-10 Diagnosis Description  W09.811 Non-pressure chronic ulcer of other part 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 Quantity: 1 : [id number] 11045 - WC PHYS SUBQ TISS EA ADDL 20 CM BOSSIE, GIAMBRA ([id number])  [phone] ICD-10 Diagnosis Description L97.812 Non-pressure chronic ulcer of other part 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 Quantity: 1 .pdf Page 8 of 8 Electronic Signature(s) Signed: 06/09/2022 9:15:38 AM By: Mitchell Guess MD FACS Entered By: Mitchell Parrish on 06/09/2022 09:15:38

## 2022-06-11 ENCOUNTER — Other Ambulatory Visit: Payer: Self-pay | Admitting: Family Medicine

## 2022-06-11 DIAGNOSIS — S81801A Unspecified open wound, right lower leg, initial encounter: Secondary | ICD-10-CM

## 2022-06-11 MED ORDER — TRAMADOL HCL 50 MG PO TABS
50.0000 mg | ORAL_TABLET | Freq: Three times a day (TID) | ORAL | 0 refills | Status: AC | PRN
Start: 2022-06-11 — End: 2022-06-16

## 2022-06-13 ENCOUNTER — Other Ambulatory Visit: Payer: Self-pay | Admitting: Cardiology

## 2022-06-14 MED ORDER — METOPROLOL SUCCINATE ER 25 MG PO TB24: 25.0000 mg | ORAL_TABLET | Freq: Two times a day (BID) | ORAL | 0 refills | Status: DC

## 2022-06-16 ENCOUNTER — Encounter (HOSPITAL_BASED_OUTPATIENT_CLINIC_OR_DEPARTMENT_OTHER): Payer: PPO | Admitting: General Surgery

## 2022-06-16 DIAGNOSIS — L97812 Non-pressure chronic ulcer of other part 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: Secondary | ICD-10-CM | POA: Diagnosis not present

## 2022-06-16 DIAGNOSIS — S80811A Abrasion, right lower leg, initial encounter: Secondary | ICD-10-CM | POA: Diagnosis not present

## 2022-06-17 NOTE — Progress Notes (Signed)
MILTON, LANIER ([id number]) [id number] Page 1 of 8 Visit Report for 06/16/2022 Chief Complaint Document Details Patient Name: Date of Service: Mitchell Parrish, Mitchell Parrish 06/16/2022 2:45 PM Medical Record Number: [id number] Patient Account Number: [id number] Date of Birth/Sex: Treating RN: 1955/02/20 (67 y.o. M) Primary Care Provider: Arva Chafe Other Clinician: Referring Provider: Treating Provider/Extender: Vivien Rossetti in Treatment: 2 Information Obtained from: Patient Chief Complaint Patient seen for complaints of Non-Healing Wound. Electronic Signature(s) Signed: 06/16/2022 4:00:10 PM By: Duanne Guess MD FACS Entered By: Duanne Guess on 06/16/2022 16:00:10 -------------------------------------------------------------------------------- Debridement Details Patient Name: Date of Service: Mitchell Parrish. 06/16/2022 2:45 PM Medical Record Number: [id number] Patient Account Number: [id number] Date of Birth/Sex: Treating RN: 07-02-1955 (67 y.o. Mitchell Parrish Primary Care Provider: Arva Chafe Other Clinician: Referring Provider: Treating Provider/Extender: Vivien Rossetti in Treatment: 2 Debridement Performed for Assessment: Wound #1 Right,Anterior Lower Leg Performed By: Physician Duanne Guess, MD Debridement Type: Debridement Level of Consciousness (Pre-procedure): Awake and Alert Pre-procedure Verification/Time Out Yes - 15:35 Taken: Start Time: 15:36 Pain Control: Lidocaine 4% T opical Solution Percent of Wound Bed Debrided: 100% T Area Debrided (cm): otal 31.09 Tissue and other material debrided: Viable, Non-Viable, Eschar, Slough, Subcutaneous, Slough Level: Skin/Subcutaneous Tissue Debridement Description: Excisional Instrument: Curette Bleeding: Minimum Hemostasis Achieved: Pressure Procedural Pain: 0 Post Procedural Pain: 0 Response to Treatment:  Procedure was tolerated well Level of Consciousness (Post- Awake and Alert procedure): Post Debridement Measurements of Total Wound Length: (cm) 13.2 Width: (cm) 3 Depth: (cm) 0.1 Volume: (cm) 3.11 Character of Wound/Ulcer Post Debridement: Improved Post Procedure Diagnosis Same as Pre-procedure Notes Scribed for Dr Lady Gary by Zenaida Deed, RN Electronic Signature(s) Signed: 06/16/2022 4:15:25 PM By: Duanne Guess MD FACS Signed: 06/16/2022 5:16:24 PM By: Zenaida Deed RN, BSN Mitchell Parrish, Mitchell Parrish ([id number]) [id number] Page 2 of 8 Entered By: Zenaida Deed on 06/16/2022 15:37:58 -------------------------------------------------------------------------------- HPI Details Patient Name: Date of Service: Mitchell Parrish, Mitchell Parrish 06/16/2022 2:45 PM Medical Record Number: [id number] Patient Account Number: [id number] Date of Birth/Sex: Treating RN: 07-Apr-1955 (67 y.o. M) Primary Care Provider: Arva Chafe Other Clinician: Referring Provider: Treating Provider/Extender: Vivien Rossetti in Treatment: 2 History of Present Illness HPI Description: ADMISSION 06/02/2022 This is a 67 year old none diabetic with a fairly extensive cardiac history who was mowing his lawn on May 02, 2021 when he went into a branch that is partially broken off telemetry. It lacerated his leg and he presented to the emergency department for further evaluation and management. The wound was sutured and he was given a course of Keflex. He returned to the ED a little more than a week later with increased swelling and erythema. He was given cefdinir and doxycycline. He has since been treating it by applying Xeroform to the site. He does smoke, admitting to half a pack per day. He is not diabetic. ABI in clinic today 0.98. The sutures have been removed. There is a longitudinal wound on his anterior tibial surface. There is slough and nonviable skin on the  surface. It appears to have filled in considerably since the time of the initial injury when comparing to the photographs in the electronic medical record. There is no periwound erythema. He does have 1+ pitting edema. 06/09/2022: The Iodoflex did a very good job of softening up the eschar and slough. There is more granulation tissue emerging underneath this. His edema control is much improved. 06/16/2022: He still has a fair amount of slough, but the wound  is getting smaller and there is more granulation tissue present. Edema control is good. Electronic Signature(s) Signed: 06/16/2022 4:01:08 PM By: Duanne Guess MD FACS Entered By: Duanne Guess on 06/16/2022 16:01:08 -------------------------------------------------------------------------------- Physical Exam Details Patient Name: Date of Service: Mitchell Parrish. 06/16/2022 2:45 PM Medical Record Number: [id number] Patient Account Number: [id number] Date of Birth/Sex: Treating RN: Oct 25, 1955 (67 y.o. M) Primary Care Provider: Arva Chafe Other Clinician: Referring Provider: Treating Provider/Extender: Vivien Rossetti in Treatment: 2 Constitutional Slightly hypertensive. . . . no acute distress. Respiratory Normal work of breathing on room air. Notes 06/16/2022: He still has a fair amount of slough, but the wound is getting smaller and there is more granulation tissue present. Edema control is good. Electronic Signature(s) Signed: 06/16/2022 4:01:38 PM By: Duanne Guess MD FACS Entered By: Duanne Guess on 06/16/2022 16:01:37 -------------------------------------------------------------------------------- Physician Orders Details Patient Name: Date of Service: Mitchell Parrish 06/16/2022 2:45 PM Medical Record Number: [id number] Patient Account Number: [id number] Date of Birth/Sex: Treating RN: 23-Dec-1955 (67 y.o. Mitchell Parrish Primary Care Provider: Arva Chafe Other  Clinician: Referring Provider: Treating Provider/Extender: Vivien Rossetti in Treatment: 2 Verbal / Phone Orders: No Mitchell Parrish, Mitchell Parrish ([id number]) [id number] Page 3 of 8 Diagnosis Coding ICD-10 Coding Code Description [phone] Non-pressure chronic ulcer of other part 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 I50.32 Chronic diastolic (congestive) heart failure J44.9 Chronic obstructive pulmonary disease, unspecified I10 Essential (primary) hypertension Z72.0 Tobacco use Follow-up Appointments ppointment in 1 week. - Dr Lady Gary - room 1 Return A Thursday May 16 @ 2:45 pm Anesthetic Wound #1 Right,Anterior Lower Leg (In clinic) Topical Lidocaine 4% applied to wound bed Bathing/ Shower/ Hygiene May shower with protection but do not get wound dressing(s) wet. Protect dressing(s) with water repellant cover (for example, large plastic bag) or a cast cover and may then take shower. Edema Control - Lymphedema / SCD / Other Right Lower Extremity Elevate legs to the level of the heart or above for 30 minutes daily and/or when sitting for 3-4 times a day throughout the day. Avoid standing for long periods of time. Exercise regularly Wound Treatment Wound #1 - Lower Leg Wound Laterality: Right, Anterior Cleanser: Soap and Water 1 x Per Week/30 Days Discharge Instructions: May shower and wash wound with dial antibacterial soap and water prior to dressing change. Cleanser: Vashe 5.8 (oz) 1 x Per Week/30 Days Discharge Instructions: Cleanse the wound with Vashe prior to applying a clean dressing using gauze sponges, not tissue or cotton balls. Peri-Wound Care: Triamcinolone 15 (g) 1 x Per Week/30 Days Discharge Instructions: Use triamcinolone 15 (g) as directed Peri-Wound Care: Sween Lotion (Moisturizing lotion) 1 x Per Week/30 Days Discharge Instructions: Apply moisturizing lotion as directed Prim Dressing: Hydrofera Blue Classic Foam,  4x4 in 1 x Per Week/30 Days ary Discharge Instructions: Moisten with saline prior to applying to wound bed Secondary Dressing: Zetuvit Plus 4x8 in 1 x Per Week/30 Days Discharge Instructions: Apply over primary dressing as directed. Compression Wrap: Urgo K2 Lite, two layer compression system, regular 1 x Per Week/30 Days Discharge Instructions: Apply Urgo K2 Lite as directed (alternative to 3 layer compression). Electronic Signature(s) Signed: 06/16/2022 4:15:25 PM By: Duanne Guess MD FACS Entered By: Duanne Guess on 06/16/2022 16:03:22 -------------------------------------------------------------------------------- Problem List Details Patient Name: Date of Service: Mitchell Parrish 06/16/2022 2:45 PM Medical Record Number: [id number] Patient Account Number: [id number] Date of Birth/Sex: Treating RN: 12/11/1955 (67 y.o. Mitchell Parrish Primary Care  Provider: Arva Chafe Other Clinician: Referring Provider: Treating Provider/Extender: Vivien Rossetti in Treatment: 2 Active Problems ICD-10 Encounter Code Description Active Date MDM Diagnosis Mitchell Parrish, Mitchell Parrish ([id number]) [id number] Page 4 of 8 [phone] Non-pressure chronic ulcer of other part of right lower leg with fat layer 06/02/2022 No Yes exposed I50.32 Chronic diastolic (congestive) heart failure 06/02/2022 No Yes J44.9 Chronic obstructive pulmonary disease, unspecified 06/02/2022 No Yes I10 Essential (primary) hypertension 06/02/2022 No Yes Z72.0 Tobacco use 06/02/2022 No Yes Inactive Problems Resolved Problems Electronic Signature(s) Signed: 06/16/2022 3:59:58 PM By: Duanne Guess MD FACS Entered By: Duanne Guess on 06/16/2022 15:59:58 -------------------------------------------------------------------------------- Progress Note Details Patient Name: Date of Service: Mitchell Savage Parrish. 06/16/2022 2:45 PM Medical Record Number: [id number] Patient  Account Number: [id number] Date of Birth/Sex: Treating RN: 25-Aug-1955 (67 y.o. M) Primary Care Provider: Arva Chafe Other Clinician: Referring Provider: Treating Provider/Extender: Vivien Rossetti in Treatment: 2 Subjective Chief Complaint Information obtained from Patient Patient seen for complaints of Non-Healing Wound. History of Present Illness (HPI) ADMISSION 06/02/2022 This is a 67 year old none diabetic with a fairly extensive cardiac history who was mowing his lawn on May 02, 2021 when he went into a branch that is partially broken off telemetry. It lacerated his leg and he presented to the emergency department for further evaluation and management. The wound was sutured and he was given a course of Keflex. He returned to the ED a little more than a week later with increased swelling and erythema. He was given cefdinir and doxycycline. He has since been treating it by applying Xeroform to the site. He does smoke, admitting to half a pack per day. He is not diabetic. ABI in clinic today 0.98. The sutures have been removed. There is a longitudinal wound on his anterior tibial surface. There is slough and nonviable skin on the surface. It appears to have filled in considerably since the time of the initial injury when comparing to the photographs in the electronic medical record. There is no periwound erythema. He does have 1+ pitting edema. 06/09/2022: The Iodoflex did a very good job of softening up the eschar and slough. There is more granulation tissue emerging underneath this. His edema control is much improved. 06/16/2022: He still has a fair amount of slough, but the wound is getting smaller and there is more granulation tissue present. Edema control is good. Patient History Information obtained from Patient. Family History Cancer - Mother,Father, Diabetes - Father, Heart Disease - Father, Hypertension - Father, Lung Disease - Mother, Stroke -  Father, Tuberculosis - Paternal Grandparents, No family history of Kidney Disease, Seizures, Thyroid Problems. Social History Current every day smoker - 1/2 pack day 20+ years, Marital Status - Married, Alcohol Use - Moderate, Drug Use - No History, Caffeine Use - Daily. Medical History Respiratory Patient has history of Chronic Obstructive Pulmonary Disease (COPD), Sleep Apnea Mitchell Parrish, Mitchell Parrish ([id number]) [id number] Page 5 of 8 Cardiovascular Patient has history of Arrhythmia, Congestive Heart Failure, Hypertension Musculoskeletal Patient has history of Osteoarthritis Medical A Surgical History Notes nd Ear/Nose/Mouth/Throat seasonal allergies Objective Constitutional Slightly hypertensive. no acute distress. Vitals Time Taken: 2:40 AM, Height: 71 in, Weight: 247 lbs, BMI: 34.4, Temperature: 98.3 F, Pulse: 88 bpm, Respiratory Rate: 20 breaths/min, Blood Pressure: 148/76 mmHg. Respiratory Normal work of breathing on room air. General Notes: 06/16/2022: He still has a fair amount of slough, but the wound is getting smaller and there is more granulation tissue present. Edema control is good. Integumentary (  Hair, Skin) Wound #1 status is Open. Original cause of wound was Trauma. The date acquired was: 05/03/2022. The wound has been in treatment 2 weeks. The wound is located on the Right,Anterior Lower Leg. The wound measures 13.2cm length x 3cm width x 0.1cm depth; 31.102cm^2 area and 3.11cm^3 volume. There is Fat Layer (Subcutaneous Tissue) exposed. There is no tunneling or undermining noted. There is a medium amount of serosanguineous drainage noted. The wound margin is flat and intact. There is medium (34-66%) red granulation within the wound bed. There is a medium (34-66%) amount of necrotic tissue within the wound bed including Adherent Slough. The periwound skin appearance had no abnormalities noted for texture. The periwound skin appearance had  no abnormalities noted for moisture. The periwound skin appearance had no abnormalities noted for color. Periwound temperature was noted as No Abnormality. The periwound has tenderness on palpation. 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 There was a Excisional Skin/Subcutaneous Tissue Debridement with a total area of 31.09 sq cm performed by Duanne Guess, MD. With the following instrument(s): Curette to remove Viable and Non-Viable tissue/material. Material removed includes Eschar, Subcutaneous Tissue, and Slough after achieving pain control using Lidocaine 4% T opical Solution. No specimens were taken. A time out was conducted at 15:35, prior to the start of the procedure. A Minimum amount of bleeding was controlled with Pressure. The procedure was tolerated well with a pain level of 0 throughout and a pain level of 0 following the procedure. Post Debridement Measurements: 13.2cm length x 3cm width x 0.1cm depth; 3.11cm^3 volume. Character of Wound/Ulcer Post Debridement is improved. Post procedure Diagnosis Wound #1: Same as Pre-Procedure General Notes: Scribed for Dr Lady Gary by Zenaida Deed, RN. Pre-procedure diagnosis of Wound #1 is an Abrasion located on the Right,Anterior Lower Leg . There was a Double Layer Compression Therapy Procedure by Zenaida Deed, RN. Post procedure Diagnosis Wound #1: Same as Pre-Procedure Plan FREAD, ACKROYD ([id number]) [phone].pdf Page 6 of 8 Follow-up Appointments: Return Appointment in 1 week. - Dr Lady Gary - room 1 Thursday May 16 @ 2:45 pm Anesthetic: Wound #1 Right,Anterior Lower  Leg: (In clinic) Topical Lidocaine 4% applied to wound bed Bathing/ Shower/ Hygiene: May shower with protection but do not get wound dressing(s) wet. Protect dressing(s) with water repellant cover (for example, large plastic bag) or a cast cover and may then take shower. Edema Control - Lymphedema / SCD / Other: Elevate legs to the level of the heart or above for 30 minutes daily and/or when sitting for 3-4 times a day throughout the day. Avoid standing for long periods of time. Exercise regularly WOUND #1: - Lower Leg Wound Laterality: Right, Anterior Cleanser: Soap and Water 1 x Per Week/30 Days Discharge Instructions: May shower and wash wound with dial antibacterial soap and water prior to dressing change. Cleanser: Vashe 5.8 (oz) 1 x Per Week/30 Days Discharge Instructions: Cleanse the wound with Vashe prior to applying a clean dressing using gauze sponges, not tissue or cotton balls. Peri-Wound Care: Triamcinolone 15 (g) 1 x Per Week/30 Days Discharge Instructions: Use triamcinolone 15 (g) as directed Peri-Wound Care: Sween Lotion (Moisturizing lotion) 1 x Per Week/30 Days Discharge Instructions: Apply moisturizing lotion as directed Prim Dressing: Hydrofera Blue Classic Foam,  4x4 in 1 x Per Week/30 Days ary Discharge Instructions: Moisten with saline prior to applying to wound bed Secondary Dressing: Zetuvit Plus 4x8 in 1 x Per Week/30 Days Discharge Instructions: Apply over primary dressing as directed. Com pression Wrap: Urgo K2 Lite, two layer compression system, regular 1 x Per Week/30 Days Discharge Instructions: Apply Urgo K2 Lite as directed (alternative to 3 layer compression). 06/16/2022: He still has a fair amount of slough, but the wound is getting smaller and there is more granulation tissue present. Edema control is good. I used a curette to debride slough and subcutaneous tissue from the wound. I am going to change his contact layer to [facility] classic and we  will continue the 3 layer compression equivalent. Follow-up in 1 week. Electronic Signature(s) Signed: 06/16/2022 4:04:06 PM By: Duanne Guess MD FACS Entered By: Duanne Guess on 06/16/2022 16:04:06 -------------------------------------------------------------------------------- HxROS Details Patient Name: Date of Service: Mitchell Parrish, Mitchell BERT Parrish. 06/16/2022 2:45 PM Medical Record Number: [id number] Patient Account Number: [id number] Date of Birth/Sex: Treating RN: [birth year] (67 y.o. M) Primary Care Provider: Arva Chafe Other Clinician: Referring Provider: Treating Provider/Extender: Vivien Rossetti in Treatment: 2 Information Obtained From Patient Ear/Nose/Mouth/Throat Medical History: Past Medical History Notes: seasonal allergies Respiratory Medical History: Positive for: Chronic Obstructive Pulmonary Disease (COPD); Sleep Apnea Cardiovascular Medical History: Positive for: Arrhythmia; Congestive Heart Failure; Hypertension Musculoskeletal Medical History: Positive for: Osteoarthritis Immunizations Pneumococcal Vaccine: Received Pneumococcal Vaccination: Mitchell Parrish, Mitchell Parrish ([id number]) [id number] Page 7 of 8 Received Pneumococcal Vaccination On or After 60th Birthday: Yes Implantable Devices None Family and Social History Cancer: Yes - Mother,Father; Diabetes: Yes - Father; Heart Disease: Yes - Father; Hypertension: Yes - Father; Kidney Disease: No; Lung Disease: Yes - Mother; Seizures: No; Stroke: Yes - Father; Thyroid Problems: No; Tuberculosis: Yes - Paternal Grandparents; Current every day smoker - 1/2 pack day 20+ years; Marital Status - Married; Alcohol Use: Moderate; Drug Use: No History; Caffeine Use: Daily; Financial Concerns: No; Food, Clothing or Shelter Needs: No; Support System Lacking: No; Transportation Concerns: No Electronic Signature(s) Signed: 06/16/2022 4:15:25 PM By: Duanne Guess MD  FACS Entered By: Duanne Guess on 06/16/2022 16:01:14 -------------------------------------------------------------------------------- SuperBill Details Patient Name: Date of Service: Mitchell Parrish. 06/16/2022 Medical Record Number: [id number] Patient Account Number: [id number] Date of Birth/Sex: Treating RN: 08-31-1955 (67 y.o. M) Primary Care Provider: Arva Chafe Other Clinician: Referring Provider: Treating Provider/Extender: Vivien Rossetti in Treatment: 2 Diagnosis Coding ICD-10 Codes Code Description [id number] Non-pressure chronic ulcer of other part 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 I50.32 Chronic diastolic (congestive) heart failure J44.9 Chronic obstructive pulmonary disease, unspecified I10 Essential (primary) hypertension Z72.0 Tobacco use Facility Procedures : CPT4 Code: [id number] Description: 11042 - DEB SUBQ TISSUE 20 SQ CM/< ICD-10 Diagnosis Description Mitchell Parrish Non-pressure chronic ulcer of other part of right lower leg with fat layer exp Modifier: osed Quantity: 1 : CPT4 Code: [id number] Description: 11045 - DEB SUBQ TISS EA ADDL 20CM ICD-10 Diagnosis Description Mitchell Parrish Non-pressure chronic ulcer of other part of right lower leg with fat layer exp Modifier: osed Quantity: 1 Physician Procedures : CPT4 Code Description Modifier [id number] - WC PHYS LEVEL 3 - EST PT 25 ICD-10 Diagnosis Description Mitchell Parrish Non-pressure chronic ulcer of other part 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 I50.32 Chronic diastolic (congestive) heart failure Z72.0  Tobacco use J44.9 Chronic obstructive pulmonary disease, unspecified Quantity: 1 : [id number] 11042 - WC PHYS SUBQ TISS 20 SQ CM ICD-10 Diagnosis  Description [phone] Non-pressure chronic ulcer of other part 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 Quantity: 1 : [id number] 11045 - WC PHYS SUBQ TISS EA ADDL 20 CM ICD-10 Diagnosis Description Mitchell Parrish Non-pressure chronic ulcer of other  part 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 Quantity: 1 Electronic Signature(s) Signed: 06/16/2022 4:04:27 PM By: Duanne Guess MD FACS Pavlovic,Signed: 06/16/2022 4:04:27 PM By: Duanne Guess MD Hulda Marin ([id number]) [id number] Page 8 of 8 Entered By: Duanne Guess on 06/16/2022 16:04:27

## 2022-06-17 NOTE — Progress Notes (Signed)
[place], [place] ([id number]) [id number] Page 1 of 6 Visit Report for 06/16/2022 Arrival Information Details Patient Name: Date of Service: Mitchell, Parrish 06/16/2022 2:45 PM Medical Record Number: [id number] Patient Account Number: [id number] Date of Birth/Sex: Treating RN: 05/23/1955 (67 y.o. M) Primary Care Nuno Brubacher: Arva Chafe Other Clinician: Referring Thanh Pomerleau: Treating Danis Pembleton/Extender: Vivien Rossetti in Treatment: 2 Visit Information History Since Last Visit All ordered tests and consults were completed: No Patient Arrived: Ambulatory Added or deleted any medications: No Arrival Time: 14:39 Any new allergies or adverse reactions: No Accompanied By: self Had a fall or experienced change in No Transfer Assistance: None activities of daily living that may affect Patient Identification Verified: Yes risk of falls: Secondary Verification Process Completed: Yes Signs or symptoms of abuse/neglect since last visito No Hospitalized since last visit: No Implantable device outside of the clinic excluding No cellular tissue based products placed in the center since last visit: Has Dressing in Place as Prescribed: Yes Has Compression in Place as Prescribed: Yes Pain Present Now: Yes Electronic Signature(s) Signed: 06/16/2022 5:16:24 PM By: Zenaida Deed RN, BSN Entered By: Zenaida Deed on 06/16/2022 15:22:36 -------------------------------------------------------------------------------- Compression Therapy Details Patient Name: Date of Service: Mitchell Parrish. 06/16/2022 2:45 PM Medical Record Number: [id number] Patient Account Number: [id number] Date of Birth/Sex: Treating RN: 03-11-1955 (67 y.o. [name] Primary Care Ahri Olson: Arva Chafe Other Clinician: Referring Catarina [name]: Treating Fermina Mishkin/Extender: Vivien Rossetti in Treatment: 2 Compression Therapy  Performed for Wound Assessment: Wound #1 Right,Anterior Lower Leg Performed By: Clinician Zenaida Deed, RN Compression Type: Double Layer Post Procedure Diagnosis Same as Pre-procedure Electronic Signature(s) Signed: 06/16/2022 5:16:24 PM By: Zenaida Deed RN, BSN Entered By: Zenaida Deed on 06/16/2022 15:39:42 -------------------------------------------------------------------------------- Lower Extremity Assessment Details Patient Name: Date of Service: Mitchell Parrish 06/16/2022 2:45 PM Medical Record Number: [id number] Patient Account Number: [id number] Date of Birth/Sex: Treating RN: 02/04/1956 (67 y.o. [name] Primary Care Avrohom Mckelvin: Arva Chafe Other Clinician: Referring Dawanda Mapel: Treating Aurther Harlin/Extender: Vivien Rossetti in Treatment: 2 Edema Assessment Assessed: [Left: No] [Right: No] Edema: [Left: Ye] [Right: s] P[Left: Mitchell Parrish, Mitchell Parrish ([id number])] [Right: [id number] Page 2 of 6] Calf Left: Right: Point of Measurement: 30 cm From Medial Instep 37.5 cm Ankle Left: Right: Point of Measurement: 11 cm From Medial Instep 27 cm Vascular Assessment Pulses: Dorsalis Pedis Palpable: [Right:Yes] Electronic Signature(s) Signed: 06/16/2022 5:16:24 PM By: Zenaida Deed RN, BSN Entered By: Zenaida Deed on 06/16/2022 15:26:38 -------------------------------------------------------------------------------- Multi Wound Chart Details Patient Name: Date of Service: Mitchell Parrish. 06/16/2022 2:45 PM Medical Record Number: [id number] Patient Account Number: [id number] Date of Birth/Sex: Treating RN: 1955/11/30 (67 y.o. M) Primary Care Rainy Rothman: Arva Chafe Other Clinician: Referring Latorie Montesano: Treating Masey Scheiber/Extender: Vivien Rossetti in Treatment: 2 Vital Signs Height(in): 71 Pulse(bpm): 88 Weight(lbs): 247 Blood Pressure(mmHg): 148/76 Body Mass  Index(BMI): 34.4 Temperature(F): 98.3 Respiratory Rate(breaths/min): 20 [1:Photos:] [N/A:N/A] Right, Anterior Lower Leg N/A N/A Wound Location: Trauma N/A N/A Wounding Event: Abrasion N/A N/A Primary Etiology: Chronic Obstructive Pulmonary N/A N/A Comorbid History: Disease (COPD), Sleep Apnea, Arrhythmia, Congestive Heart Failure, Hypertension, Osteoarthritis 05/03/2022 N/A N/A Date Acquired: 2 N/A N/A Weeks of Treatment: Open N/A N/A Wound Status: No N/A N/A Wound Recurrence: 13.2x3x0.1 N/A N/A Measurements L x W x D (cm) 31.102 N/A N/A A (cm) : rea 3.11 N/A N/A Volume (cm) : 10.40% N/A N/A % Reduction in Area: 10.40% N/A N/A % Reduction in Volume: Full Thickness Without  Exposed N/A N/A Classification: Support Structures Medium N/A N/A Exudate Amount: Serosanguineous N/A N/A Exudate Type: red, brown N/A N/A Exudate Color: Flat and Intact N/A N/A Wound Margin: Medium (34-66%) N/A N/A Granulation Amount: Red N/A N/A Granulation Quality: Medium (34-66%) N/A N/A Necrotic Amount: Fat Layer (Subcutaneous Tissue): Yes N/A N/A Exposed Structures: Fascia: No Mitchell, Parrish ([id number]) [id number] Page 3 of 6 Tendon: No Muscle: No Joint: No Bone: No Small (1-33%) N/A N/A Epithelialization: Debridement - Excisional N/A N/A Debridement: Pre-procedure Verification/Time Out 15:35 N/A N/A Taken: Lidocaine 4% Topical Solution N/A N/A Pain Control: Necrotic/Eschar, Subcutaneous, N/A N/A Tissue Debrided: Slough Skin/Subcutaneous Tissue N/A N/A Level: 31.09 N/A N/A Debridement A (sq cm): rea Curette N/A N/A Instrument: Minimum N/A N/A Bleeding: Pressure N/A N/A Hemostasis Achieved: 0 N/A N/A Procedural Pain: 0 N/A N/A Post Procedural Pain: Debridement Treatment Response: Procedure was tolerated well N/A N/A Post Debridement Measurements L x 13.2x3x0.1 N/A N/A W x D (cm) 3.11 N/A N/A Post Debridement Volume:  (cm) No Abnormalities Noted N/A N/A Periwound Skin Texture: No Abnormalities Noted N/A N/A Periwound Skin Moisture: No Abnormalities Noted N/A N/A Periwound Skin Color: No Abnormality N/A N/A Temperature: Yes N/A N/A Tenderness on Palpation: Compression Therapy N/A N/A Procedures Performed: Debridement Treatment Notes Electronic Signature(s) Signed: 06/16/2022 4:00:03 PM By: Duanne Guess MD FACS Entered By: Duanne Guess on 06/16/2022 16:00:03 -------------------------------------------------------------------------------- Multi-Disciplinary Care Plan Details Patient Name: Date of Service: Mitchell Parrish. 06/16/2022 2:45 PM Medical Record Number: [id number] Patient Account Number: [id number] Date of Birth/Sex: Treating RN: [birth year] (67 y.o. [name] Primary Care Kamorah Nevils: Arva Chafe Other Clinician: Referring Haywood Meinders: Treating Berlie Hatchel/Extender: Vivien Rossetti in Treatment: 2 Multidisciplinary Care Plan reviewed with physician Active Inactive Wound/Skin Impairment Nursing Diagnoses: Impaired tissue integrity Knowledge deficit related to ulceration/compromised skin integrity Goals: Patient/caregiver will verbalize understanding of skin care regimen Date Initiated: 06/02/2022 Target Resolution Date: 06/23/2022 Goal Status: Active Ulcer/skin breakdown will have a volume reduction of 30% by week 4 Date Initiated: 06/02/2022 Target Resolution Date: 06/30/2022 Goal Status: Active Interventions: Assess patient/caregiver ability to obtain necessary supplies Assess patient/caregiver ability to perform ulcer/skin care regimen upon admission and as needed Assess ulceration(s) every visit Provide education on ulcer and skin care Notes: Electronic Signature(s) Mitchell, Parrish ([id number]) [id number] Page 4 of 6 Signed: 06/16/2022 5:16:24 PM By: Zenaida Deed RN, BSN Entered By: Zenaida Deed on  06/16/2022 15:29:22 -------------------------------------------------------------------------------- Pain Assessment Details Patient Name: Date of Service: Mitchell, Parrish 06/16/2022 2:45 PM Medical Record Number: [id number] Patient Account Number: [id number] Date of Birth/Sex: Treating RN: 1955-07-30 (67 y.o. M) Primary Care Sherlyne Crownover: Arva Chafe Other Clinician: Referring Treyton Slimp: Treating Samara Stankowski/Extender: Vivien Rossetti in Treatment: 2 Active Problems Location of Pain Severity and Description of Pain Patient Has Paino Yes Site Locations Pain Location: Pain in Ulcers With Dressing Change: Yes Duration of the Pain. Constant / Intermittento Intermittent Rate the pain. Current Pain Level: 0 Worst Pain Level: 6 Least Pain Level: 0 Character of Pain Describe the Pain: Aching Pain Management and Medication Current Pain Management: Medication: Yes Other: time Is the Current Pain Management Adequate: Adequate How does your wound impact your activities of daily livingo Sleep: No Bathing: No Appetite: No Relationship With Others: No Bladder Continence: No Emotions: No Bowel Continence: No Work: No Toileting: No Drive: No Dressing: No Hobbies: No Electronic Signature(s) Signed: 06/16/2022 5:16:24 PM By: Zenaida Deed RN, BSN Entered By: Zenaida Deed on 06/16/2022 15:23:28 -------------------------------------------------------------------------------- Patient/Caregiver Education Details Patient  Name: Date of Service: Mitchell, Parrish 5/9/2024andnbsp2:45 PM Medical Record Number: [id number] Patient Account Number: [id number] Date of Birth/Gender: Treating RN: March 01, 1955 (67 y.o. [name] Primary Care Physician: Arva Chafe Other Clinician: Referring Physician: Treating Physician/Extender: Vivien Rossetti in Treatment: 2 Education [address] [place] ([id number])  [id number] Page 5 of 6 Education Provided To: Patient Education Topics Provided Venous: Methods: Explain/Verbal Responses: Reinforcements needed, State content correctly Wound/Skin Impairment: Methods: Explain/Verbal Responses: Reinforcements needed, State content correctly Electronic Signature(s) Signed: 06/16/2022 5:16:24 PM By: Zenaida Deed RN, BSN Entered By: Zenaida Deed on 06/16/2022 15:29:48 -------------------------------------------------------------------------------- Wound Assessment Details Patient Name: Date of Service: Mitchell Parrish. 06/16/2022 2:45 PM Medical Record Number: [id number] Patient Account Number: [id number] Date of Birth/Sex: Treating RN: 01/22/1956 (67 y.o. [name] Primary Care Daveon Arpino: Arva Chafe Other Clinician: Referring Leomar Westberg: Treating Toula Miyasaki/Extender: Vivien Rossetti in Treatment: 2 Wound Status Wound Number: 1 Primary Abrasion Etiology: Wound Location: Right, Anterior Lower Leg Wound Open Wounding Event: Trauma Status: Date Acquired: 05/03/2022 Comorbid Chronic Obstructive Pulmonary Disease (COPD), Sleep Apnea, Weeks Of Treatment: 2 History: Arrhythmia, Congestive Heart Failure, Hypertension, Osteoarthritis Clustered Wound: No Photos Wound Measurements Length: (cm) 13.2 Width: (cm) 3 Depth: (cm) 0.1 Area: (cm) 31.102 Volume: (cm) 3.11 % Reduction in Area: 10.4% % Reduction in Volume: 10.4% Epithelialization: Small (1-33%) Tunneling: No Undermining: No Wound Description Classification: Full Thickness Without Exposed Suppor Wound Margin: Flat and Intact Exudate Amount: Medium Exudate Type: Serosanguineous Exudate Color: red, brown t Structures Foul Odor After Cleansing: No Slough/Fibrino Yes Wound Bed Granulation Amount: Medium (34-66%) Exposed Structure Granulation Quality: Red Fascia Exposed: No Necrotic Amount: Medium (34-66%) Fat Layer  (Subcutaneous Tissue) Exposed: Yes Necrotic Quality: Adherent Slough Tendon Exposed: No Muscle Exposed: No Joint Exposed: No Mitchell, [place] Parrish ([id number]) [id number].pdf Page 6 of 6 Bone Exposed: No Periwound Skin Texture Texture Color No Abnormalities Noted: Yes No Abnormalities Noted: Yes Moisture Temperature / Pain No Abnormalities Noted: Yes Temperature: No Abnormality Tenderness on Palpation: Yes Electronic Signature(s) Signed: 06/16/2022 5:16:24 PM By: Zenaida Deed RN, BSN Entered By: Zenaida Deed on 06/16/2022 15:27:45 -------------------------------------------------------------------------------- Vitals Details Patient Name: Date of Service: Mitchell Parrish. 06/16/2022 2:45 PM Medical Record Number: [id number] Patient Account Number: [id number] Date of Birth/Sex: Treating RN: [birth year] (67 y.o. M) Primary Care Dorenda Pfannenstiel: Arva Chafe Other Clinician: Referring Tanairi Cypert: Treating Zarion Oliff/Extender: Vivien Rossetti in Treatment: 2 Vital Signs Time Taken: 02:40 Temperature (F): 98.3 Height (in): 71 Pulse (bpm): 88 Weight (lbs): 247 Respiratory Rate (breaths/min): 20 Body Mass Index (BMI): 34.4 Blood Pressure (mmHg): 148/76 Reference Range: 80 - 120 mg / dl Electronic Signature(s) Signed: 06/16/2022 5:16:24 PM By: Zenaida Deed RN, BSN Entered By: Zenaida Deed on 06/16/2022 15:22:41

## 2022-06-23 ENCOUNTER — Encounter (HOSPITAL_BASED_OUTPATIENT_CLINIC_OR_DEPARTMENT_OTHER): Payer: PPO | Admitting: General Surgery

## 2022-06-23 DIAGNOSIS — L97812 Non-pressure chronic ulcer of other part 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: Secondary | ICD-10-CM | POA: Diagnosis not present

## 2022-06-23 DIAGNOSIS — S80811A Abrasion, right lower leg, initial encounter: Secondary | ICD-10-CM | POA: Diagnosis not present

## 2022-06-30 ENCOUNTER — Encounter (HOSPITAL_BASED_OUTPATIENT_CLINIC_OR_DEPARTMENT_OTHER): Payer: PPO | Admitting: General Surgery

## 2022-06-30 DIAGNOSIS — S80811A Abrasion, right lower leg, initial encounter: Secondary | ICD-10-CM | POA: Diagnosis not present

## 2022-06-30 DIAGNOSIS — L97812 Non-pressure chronic ulcer of other part 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: Secondary | ICD-10-CM | POA: Diagnosis not present

## 2022-07-07 ENCOUNTER — Encounter (HOSPITAL_BASED_OUTPATIENT_CLINIC_OR_DEPARTMENT_OTHER): Payer: PPO | Admitting: General Surgery

## 2022-07-07 DIAGNOSIS — S80811A Abrasion, right lower leg, initial encounter: Secondary | ICD-10-CM | POA: Diagnosis not present

## 2022-07-07 DIAGNOSIS — L97812 Non-pressure chronic ulcer of other part 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: Secondary | ICD-10-CM | POA: Diagnosis not present

## 2022-07-11 ENCOUNTER — Encounter (HOSPITAL_BASED_OUTPATIENT_CLINIC_OR_DEPARTMENT_OTHER): Payer: PPO | Admitting: General Surgery

## 2022-07-14 ENCOUNTER — Encounter (HOSPITAL_BASED_OUTPATIENT_CLINIC_OR_DEPARTMENT_OTHER): Payer: PPO | Attending: General Surgery | Admitting: General Surgery

## 2022-07-14 DIAGNOSIS — I110 Hypertensive heart disease with heart failure: Secondary | ICD-10-CM | POA: Insufficient documentation

## 2022-07-14 DIAGNOSIS — F17210 Nicotine dependence, cigarettes, uncomplicated: Secondary | ICD-10-CM | POA: Insufficient documentation

## 2022-07-14 DIAGNOSIS — L97812 Non-pressure chronic ulcer of other part 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: Secondary | ICD-10-CM | POA: Diagnosis not present

## 2022-07-14 DIAGNOSIS — S80811A Abrasion, right lower leg, initial encounter: Secondary | ICD-10-CM | POA: Diagnosis not present

## 2022-07-14 DIAGNOSIS — I5032 Chronic diastolic (congestive) heart failure: Secondary | ICD-10-CM | POA: Diagnosis not present

## 2022-07-14 DIAGNOSIS — J449 Chronic obstructive pulmonary disease, unspecified: Secondary | ICD-10-CM | POA: Diagnosis not present

## 2022-07-18 ENCOUNTER — Telehealth: Payer: Self-pay | Admitting: Family Medicine

## 2022-07-18 NOTE — Telephone Encounter (Signed)
Copied from CRM [id number]. Topic: Medicare AWV >> Jul 18, 2022 11:12 AM Payton Doughty wrote: Reason for CRM: LM 07/18/2022 to schedule AWV   Verlee Rossetti; Care Guide Ambulatory Clinical Support [place] [facility] Direct Dial: [phone]

## 2022-07-21 ENCOUNTER — Encounter (HOSPITAL_BASED_OUTPATIENT_CLINIC_OR_DEPARTMENT_OTHER): Payer: PPO | Admitting: General Surgery

## 2022-07-21 DIAGNOSIS — L97812 Non-pressure chronic ulcer of other part 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: Secondary | ICD-10-CM | POA: Diagnosis not present

## 2022-07-21 DIAGNOSIS — S80811A Abrasion, right lower leg, initial encounter: Secondary | ICD-10-CM | POA: Diagnosis not present

## 2022-07-23 NOTE — Progress Notes (Signed)
Mitchell, Parrish ([id number]) [id number] Page 1 of 8 Visit Report for 07/21/2022 Chief Complaint Document Details Patient Name: Date of Service: Mitchell Parrish, Mitchell Parrish 07/21/2022 2:45 PM Medical Record Number: [id number] Patient Account Number: [id number] Date of Birth/Sex: Treating RN: 11-Oct-1955 (67 y.o. M) Primary Care Provider: Arva Chafe Other Clinician: Referring Provider: Treating Provider/Extender: Vivien Rossetti in Treatment: 7 Information Obtained from: Patient Chief Complaint Patient seen for complaints of Non-Healing Wound. Electronic Signature(s) Signed: 07/21/2022 3:37:56 PM By: Mitchell Parrish Entered By: Mitchell Guess on 07/21/2022 15:37:55 -------------------------------------------------------------------------------- Debridement Details Patient Name: Date of Service: Mitchell Parrish. 07/21/2022 2:45 PM Medical Record Number: [id number] Patient Account Number: [id number] Date of Birth/Sex: Treating RN: 04/24/1955 (67 y.o. [name] Primary Care Provider: Arva Chafe Other Clinician: Referring Provider: Treating Provider/Extender: Vivien Rossetti in Treatment: 7 Debridement Performed for Assessment: Wound #1 Right,Anterior Lower Leg Performed By: Physician Mitchell Guess, MD Debridement Type: Debridement Level of Consciousness (Pre-procedure): Awake and Alert Pre-procedure Verification/Time Out Yes - 15:15 Taken: Start Time: 15:15 Pain Control: Lidocaine 5% topical ointment Percent of Wound Bed Debrided: 75% T Area Debrided (cm): otal 7.95 Tissue and other material debrided: Non-Viable, Eschar, Slough, Slough Level: Non-Viable Tissue Debridement Description: Selective/Open Wound Instrument: Curette Bleeding: Minimum Hemostasis Achieved: Pressure Procedural Pain: 0 Post Procedural Pain: 0 Response to Treatment: Procedure was tolerated  well Level of Consciousness (Post- Awake and Alert procedure): Post Debridement Measurements of Total Wound Length: (cm) 9 Width: (cm) 1.5 Depth: (cm) 0.1 Volume: (cm) 1.06 Character of Wound/Ulcer Post Debridement: Improved Post Procedure Diagnosis JASSIEL, TESFAI K ([id number]) [phone].pdf Page 2 of 8 Same as Pre-procedure Notes scribed for Dr. Lady Gary by Zenaida Deed, RN Electronic Signature(s) Signed: 07/21/2022 4:25:23 PM By: Mitchell Parrish Signed: 07/21/2022 4:48:54 PM By: Zenaida Deed RN, BSN Entered By: Zenaida Deed on 07/21/2022 15:18:09 -------------------------------------------------------------------------------- HPI Details Patient Name: Date of Service: Mitchell Parrish. 07/21/2022 2:45 PM Medical Record Number: [id number] Patient Account Number: [id number] Date of Birth/Sex: Treating RN: [birth year] (67 y.o. M) Primary Care Provider: Arva Chafe Other Clinician: Referring Provider: Treating Provider/Extender: Vivien Rossetti in Treatment: 7 History of Present Illness HPI Description: ADMISSION 06/02/2022 This is a 67 year old none diabetic with a fairly extensive cardiac history who was mowing his lawn on May 02, 2021 when he went into a branch that is partially broken off telemetry. It lacerated his leg and he presented to the emergency department for further evaluation and management. The wound was sutured and he was given a course of Keflex. He returned to the ED a little more than a week later with increased swelling and erythema. He was given cefdinir and doxycycline. He has since been treating it by applying Xeroform to the site. He does smoke, admitting to half a pack per day. He is not diabetic. ABI in clinic today 0.98. The sutures have been removed. There is a longitudinal wound on his anterior tibial surface. There is slough and nonviable skin on the surface. It appears to have  filled in considerably since the time of the initial injury when comparing to the photographs in the electronic medical record. There is no periwound erythema. He does have 1+ pitting edema. 06/09/2022: The Iodoflex did a very good job of softening up the eschar and slough. There is more granulation tissue emerging underneath this. His edema control is much improved. 06/16/2022: He still has a fair amount of slough, but the wound is getting  smaller and there is more granulation tissue present. Edema control is good. 06/23/2022: The wound is shorter in length by about half a centimeter. There is good granulation tissue on the surface and substantially less slough. There is still some depth to the wound and some nonviable fat at the distal end. 06/30/2022: The wound is shorter by another centimeter this week. There is very little slough. There is still some depth at the distal end but the rest has filled with good granulation tissue and is flush with the surrounding skin. 07/07/2022: The wound was shorter by about half a centimeter this week. Minimal slough accumulation. The distal end depth is filling in and the entire wound is nearly flush with the surrounding skin now. 07/14/2022: The wound continues to contract. There is more and more epithelium emerging. Very minimal slough present. 07/21/2022: The epithelium has expanded to the point that it has divided the wound into 2 separate sections. The surface is fairly clean with just a small amount of slough present and some thin dry eschar around the edges. Electronic Signature(s) Signed: 07/21/2022 3:41:53 PM By: Mitchell Parrish Entered By: Mitchell Guess on 07/21/2022 15:41:53 -------------------------------------------------------------------------------- Physical Exam Details Patient Name: Date of Service: Mitchell Savage K. 07/21/2022 2:45 PM Medical Record Number: [id number] Patient Account Number: [id number] Date of Birth/Sex: Treating  RN: 1955-07-15 (67 y.o. M) Primary Care Provider: Arva Chafe Other Clinician: Referring Provider: Treating Provider/Extender: Higinio, Lene ([id number]) [id number] Page 3 of 8 Weeks in Treatment: 7 Constitutional Hypertensive, asymptomatic. . . . no acute distress. Respiratory Normal work of breathing on room air. Notes 07/21/2022: The epithelium has expanded to the point that it has divided the wound into 2 separate sections. The surface is fairly clean with just a small amount of slough present and some thin dry eschar around the edges. Electronic Signature(s) Signed: 07/21/2022 3:44:41 PM By: Mitchell Parrish Entered By: Mitchell Guess on 07/21/2022 15:44:40 -------------------------------------------------------------------------------- Physician Orders Details Patient Name: Date of Service: Mitchell Parrish. 07/21/2022 2:45 PM Medical Record Number: [id number] Patient Account Number: [id number] Date of Birth/Sex: Treating RN: 1955-08-23 (67 y.o. [name] Primary Care Provider: Arva Chafe Other Clinician: Referring Provider: Treating Provider/Extender: Vivien Rossetti in Treatment: 7 Verbal / Phone Orders: No Diagnosis Coding ICD-10 Coding Code Description [phone] Non-pressure chronic ulcer of other part 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 I50.32 Chronic diastolic (congestive) heart failure J44.9 Chronic obstructive pulmonary disease, unspecified I10 Essential (primary) hypertension Z72.0 Tobacco use Follow-up Appointments ppointment in 2 weeks. - Dr Lady Gary - room 1 Return A Thursday 6/27 @ 2:45 pm Nurse Visit: - Thursday 6/20 @ 2:45 pm RM 1 Anesthetic Wound #1 Right,Anterior Lower Leg (In clinic) Topical Lidocaine 4% applied to wound bed Bathing/ Shower/ Hygiene May shower with protection but do not get wound dressing(s) wet.  Protect dressing(s) with water repellant cover (for example, large plastic bag) or a cast cover and may then take shower. Edema Control - Lymphedema / SCD / Other Right Lower Extremity Elevate legs to the level of the heart or above for 30 minutes daily and/or when sitting for 3-4 times a day throughout the day. Avoid standing for long periods of time. Exercise regularly Wound Treatment Wound #1 - Lower Leg Wound Laterality: Right, Anterior Cleanser: Soap and Water 1 x Per Week/30 Days Discharge Instructions: May shower and wash wound with dial antibacterial soap and water prior to dressing change. Cleanser: Vashe 5.8 (  oz) 1 x Per Week/30 Days Discharge Instructions: Cleanse the wound with Vashe prior to applying a clean dressing using gauze sponges, not tissue or cotton balls. Peri-Wound Care: Triamcinolone 15 (g) 1 x Per Week/30 Days Discharge Instructions: Use triamcinolone 15 (g) as directed HITOSHI, BORSCH ([id number]) [id number] Page 4 of 8 Peri-Wound Care: Sween Lotion (Moisturizing lotion) 1 x Per Week/30 Days Discharge Instructions: Apply moisturizing lotion as directed Prim Dressing: Hydrofera Blue Classic Foam, 4x4 in 1 x Per Week/30 Days ary Discharge Instructions: Moisten with saline prior to applying to wound bed Secondary Dressing: Woven Gauze Sponge, Non-Sterile 4x4 in 1 x Per Week/30 Days Discharge Instructions: Apply over primary dressing as directed. Compression Wrap: Urgo K2 Lite, (equivalent to a 3 layer) two layer compression system, regular 1 x Per Week/30 Days Discharge Instructions: Apply Urgo K2 Lite as directed (alternative to 3 layer compression). Electronic Signature(s) Signed: 07/21/2022 4:25:23 PM By: Mitchell Parrish Entered By: Mitchell Guess on 07/21/2022 15:44:55 -------------------------------------------------------------------------------- Problem List Details Patient Name: Date of Service: Mitchell Parrish. 07/21/2022 2:45 PM Medical Record Number: [id number] Patient Account Number: [id number] Date of Birth/Sex: Treating RN: 01-31-1956 (67 y.o. [name] Primary Care Provider: Arva Chafe Other Clinician: Referring Provider: Treating Provider/Extender: Vivien Rossetti in Treatment: 7 Active Problems ICD-10 Encounter Code Description Active Date MDM Diagnosis [phone] Non-pressure chronic ulcer of other part of right lower leg with fat layer 06/02/2022 No Yes exposed I50.32 Chronic diastolic (congestive) heart failure 06/02/2022 No Yes J44.9 Chronic obstructive pulmonary disease, unspecified 06/02/2022 No Yes I10 Essential (primary) hypertension 06/02/2022 No Yes Z72.0 Tobacco use 06/02/2022 No Yes Inactive Problems Resolved Problems Electronic Signature(s) Signed: 07/21/2022 3:37:41 PM By: Mitchell Parrish Entered By: Mitchell Guess on 07/21/2022 15:37:41 Kaeding, Chales Salmon ([id number]) [id number] Page 5 of 8 -------------------------------------------------------------------------------- Progress Note Details Patient Name: Date of Service: MARSHAUN, MARRESE 07/21/2022 2:45 PM Medical Record Number: [id number] Patient Account Number: [id number] Date of Birth/Sex: Treating RN: [birth year] (67 y.o. M) Primary Care Provider: Arva Chafe Other Clinician: Referring Provider: Treating Provider/Extender: Vivien Rossetti in Treatment: 7 Subjective Chief Complaint Information obtained from Patient Patient seen for complaints of Non-Healing Wound. History of Present Illness (HPI) ADMISSION 06/02/2022 This is a 67 year old none diabetic with a fairly extensive cardiac history who was mowing his lawn on May 02, 2021 when he went into a branch that is partially broken off telemetry. It lacerated his leg and he presented to the emergency department for further evaluation and  management. The wound was sutured and he was given a course of Keflex. He returned to the ED a little more than a week later with increased swelling and erythema. He was given cefdinir and doxycycline. He has since been treating it by applying Xeroform to the site. He does smoke, admitting to half a pack per day. He is not diabetic. ABI in clinic today 0.98. The sutures have been removed. There is a longitudinal wound on his anterior tibial surface. There is slough and nonviable skin on the surface. It appears to have filled in considerably since the time of the initial injury when comparing to the photographs in the electronic medical record. There is no periwound erythema. He does have 1+ pitting edema. 06/09/2022: The Iodoflex did a very good job of softening up the eschar and slough. There is more granulation tissue emerging underneath this. His edema control is much improved. 06/16/2022: He still has a fair amount of slough,  but the wound is getting smaller and there is more granulation tissue present. Edema control is good. 06/23/2022: The wound is shorter in length by about half a centimeter. There is good granulation tissue on the surface and substantially less slough. There is still some depth to the wound and some nonviable fat at the distal end. 06/30/2022: The wound is shorter by another centimeter this week. There is very little slough. There is still some depth at the distal end but the rest has filled with good granulation tissue and is flush with the surrounding skin. 07/07/2022: The wound was shorter by about half a centimeter this week. Minimal slough accumulation. The distal end depth is filling in and the entire wound is nearly flush with the surrounding skin now. 07/14/2022: The wound continues to contract. There is more and more epithelium emerging. Very minimal slough present. 07/21/2022: The epithelium has expanded to the point that it has divided the wound into 2 separate sections. The  surface is fairly clean with just a small amount of slough present and some thin dry eschar around the edges. Patient History Information obtained from Patient. Family History Cancer - Mother,Father, Diabetes - Father, Heart Disease - Father, Hypertension - Father, Lung Disease - Mother, Stroke - Father, Tuberculosis - Paternal Grandparents, No family history of Kidney Disease, Seizures, Thyroid Problems. Social History Current every day smoker - 1/2 pack day 20+ years, Marital Status - Married, Alcohol Use - Moderate, Drug Use - No History, Caffeine Use - Daily. Medical History Respiratory Patient has history of Chronic Obstructive Pulmonary Disease (COPD), Sleep Apnea Cardiovascular Patient has history of Arrhythmia, Congestive Heart Failure, Hypertension Musculoskeletal Patient has history of Osteoarthritis Medical A Surgical History Notes nd Ear/Nose/Mouth/Throat seasonal allergies Objective NORWOOD, DENHART ([id number]) [id number] Page 6 of 8 Constitutional Hypertensive, asymptomatic. no acute distress. Vitals Time Taken: 2:36 AM, Height: 71 in, Weight: 247 lbs, BMI: 34.4, Temperature: 97.6 F, Pulse: 81 bpm, Respiratory Rate: 20 breaths/min, Blood Pressure: 162/94 mmHg. Respiratory Normal work of breathing on room air. General Notes: 07/21/2022: The epithelium has expanded to the point that it has divided the wound into 2 separate sections. The surface is fairly clean with just a small amount of slough present and some thin dry eschar around the edges. Integumentary (Hair, Skin) Wound #1 status is Open. Original cause of wound was Trauma. The date acquired was: 05/03/2022. The wound has been in treatment 7 weeks. The wound is located on the Right,Anterior Lower Leg. The wound measures 9cm length x 1.5cm width x 0.2cm depth; 10.603cm^2 area and 2.121cm^3 volume. There is Fat Layer (Subcutaneous Tissue) exposed. There is no tunneling or undermining  noted. There is a medium amount of serosanguineous drainage noted. The wound margin is flat and intact. There is large (67-100%) red granulation within the wound bed. There is a small (1-33%) amount of necrotic tissue within the wound bed including Adherent Slough. The periwound skin appearance had no abnormalities noted for texture. The periwound skin appearance had no abnormalities noted for moisture. The periwound skin appearance had no abnormalities noted for color. Periwound temperature was noted as No Abnormality. 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 There was a Selective/Open Wound Non-Viable Tissue Debridement with a total area of 7.95  sq cm performed by Mitchell Guess, MD. With the following instrument(s): Curette to remove Non-Viable tissue/material. Material removed includes Eschar and Slough and after achieving pain control using Lidocaine 5% topical ointment. No specimens were taken. A time out was conducted at 15:15, prior to the start of the procedure. A Minimum amount of bleeding was controlled with Pressure. The procedure was tolerated well with a pain level of 0 throughout and a pain level of 0 following the procedure. Post Debridement Measurements: 9cm length x 1.5cm width x 0.1cm depth; 1.06cm^3 volume. Character of Wound/Ulcer Post Debridement is improved. Post procedure Diagnosis Wound #1: Same as Pre-Procedure General Notes: scribed for Dr. Lady Gary by Zenaida Deed, RN. Pre-procedure diagnosis of Wound #1 is an Abrasion located on the Right,Anterior Lower Leg . There was a Double Layer Compression Therapy Procedure by Zenaida Deed,  RN. Post procedure Diagnosis Wound #1: Same as Pre-Procedure Notes: urgo lite. Plan Follow-up Appointments: Return Appointment in 2 weeks. - Dr Lady Gary - room 1 Thursday 6/27 @ 2:45 pm Nurse Visit: - Thursday 6/20 @ 2:45 pm RM 1 Anesthetic: Wound #1 Right,Anterior Lower Leg: (In clinic) Topical Lidocaine 4% applied to wound bed Bathing/ Shower/ Hygiene: May shower with protection but do not get wound dressing(s) wet. Protect dressing(s) with water repellant cover (for example, large plastic bag) or a cast cover and may then take shower. Edema Control - Lymphedema / SCD / Other: Elevate legs to the level of the heart or above for 30 minutes daily and/or when sitting for 3-4 times a day throughout the day. Avoid standing for long periods of time. Exercise regularly WOUND #1: - Lower Leg Wound Laterality: Right, Anterior Cleanser: Soap and Water 1 x Per Week/30 Days Discharge Instructions: May shower and wash wound with dial antibacterial soap and water prior to dressing change. Cleanser: Vashe 5.8 (oz) 1 x Per Week/30 Days Discharge Instructions: Cleanse the wound with Vashe prior to applying a clean dressing using gauze sponges, not tissue or cotton balls. Peri-Wound Care: Triamcinolone 15 (g) 1 x Per Week/30 Days Discharge Instructions: Use triamcinolone 15 (g) as directed Peri-Wound Care: Sween Lotion (Moisturizing lotion) 1 x Per Week/30 Days LEVELLE, BALSAMO ([id number]) [phone].pdf Page 7 of 8 Discharge Instructions: Apply moisturizing lotion as directed Prim Dressing: Hydrofera Blue Classic Foam, 4x4 in 1 x Per Week/30 Days ary Discharge Instructions: Moisten with saline prior to applying to wound bed Secondary Dressing: Woven Gauze Sponge, Non-Sterile 4x4 in 1 x Per Week/30 Days Discharge Instructions: Apply over primary dressing as directed. Com pression Wrap: Urgo K2 Lite, (equivalent to a 3 layer) two layer compression system, regular 1 x Per  Week/30 Days Discharge Instructions: Apply Urgo K2 Lite as directed (alternative to 3 layer compression). 07/21/2022: The epithelium has expanded to the point that it has divided the wound into 2 separate sections. The surface is fairly clean with just a small amount of slough present and some thin dry eschar around the edges. I used a curette to debride slough and eschar from the wound. We will continue Hydrofera Blue classic with 3 layer compression/equivalent. Due to clinic scheduling, he will have a nurse visit next week and I will see him in 2 weeks. Electronic Signature(s) Signed: 07/21/2022 3:45:29 PM By: Mitchell Parrish Entered By: Mitchell Guess on 07/21/2022 15:45:29 -------------------------------------------------------------------------------- HxROS Details Patient Name: Date of Service: Vear Clock, RO BERT K. 07/21/2022 2:45 PM Medical Record Number: [id number] Patient Account Number: [id number] Date of Birth/Sex: Treating RN: 1955/05/14 (67 y.o. M) Primary  Care Provider: Arva Chafe Other Clinician: Referring Provider: Treating Provider/Extender: Vivien Rossetti in Treatment: 7 Information Obtained From Patient Ear/Nose/Mouth/Throat Medical History: Past Medical History Notes: seasonal allergies Respiratory Medical History: Positive for: Chronic Obstructive Pulmonary Disease (COPD); Sleep Apnea Cardiovascular Medical History: Positive for: Arrhythmia; Congestive Heart Failure; Hypertension Musculoskeletal Medical History: Positive for: Osteoarthritis Immunizations Pneumococcal Vaccine: Received Pneumococcal Vaccination: Yes Received Pneumococcal Vaccination On or After 60th Birthday: Yes Implantable Devices None Family and Social History Cancer: Yes - Mother,Father; Diabetes: Yes - Father; Heart Disease: Yes - Father; Hypertension: Yes - Father; Kidney Disease: No; Lung Disease: Yes - Mother; Seizures: No; Stroke: Yes -  Father; Thyroid Problems: No; Tuberculosis: Yes - Paternal Grandparents; Current every day smoker - 1/2 pack day 20+ years; Marital Status - Married; Alcohol Use: Moderate; Drug Use: No History; Caffeine Use: Daily; Financial Concerns: No; Food, Clothing or Shelter Needs: No; Support System Lacking: No; Transportation Concerns: No ARTHAR, SICHER ([id number]) [id number] Page 8 of 8 Electronic Signature(s) Signed: 07/21/2022 4:25:23 PM By: Mitchell Parrish Entered By: Mitchell Guess on 07/21/2022 15:41:59 -------------------------------------------------------------------------------- SuperBill Details Patient Name: Date of Service: Barbaraann Boys BERT K. 07/21/2022 Medical Record Number: [id number] Patient Account Number: [id number] Date of Birth/Sex: Treating RN: 11-16-1955 (67 y.o. M) Primary Care Provider: Arva Chafe Other Clinician: Referring Provider: Treating Provider/Extender: Vivien Rossetti in Treatment: 7 Diagnosis Coding ICD-10 Codes Code Description [phone] Non-pressure chronic ulcer of other part 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 I50.32 Chronic diastolic (congestive) heart failure J44.9 Chronic obstructive pulmonary disease, unspecified I10 Essential (primary) hypertension Z72.0 Tobacco use Facility Procedures : CPT4 Code: [id number] Description: [phone] - DEBRIDE WOUND 1ST 20 SQ CM OR < ICD-10 Diagnosis Description L97.812 Non-pressure chronic ulcer of other part of right lower leg with fat layer expos Modifier: ed Quantity: 1 Physician Procedures : CPT4 Code Description Modifier [id number] - WC PHYS LEVEL 3 - EST PT 25 ICD-10 Diagnosis Description L97.812 Non-pressure chronic ulcer of other part 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 Z72.0 Tobacco use I50.32 Chronic diastolic (congestive)  heart failure J44.9 Chronic obstructive pulmonary disease, unspecified Quantity: 1 : [id number]  - WC PHYS DEBR WO ANESTH 20 SQ CM ICD-10 Diagnosis Description L97.812 Non-pressure chronic ulcer of other part 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 Quantity: 1 Electronic Signature(s) Signed: 07/21/2022 3:45:53 PM By: Mitchell Parrish Entered By: Mitchell Guess on 07/21/2022 15:45:52

## 2022-07-23 NOTE — Progress Notes (Signed)
BENNY, TUMOLO ([id number]) [id number] Page 1 of 8 Visit Report for 07/21/2022 Arrival Information Details Patient Name: Date of Service: KENDELL, BERDING 07/21/2022 2:45 PM Medical Record Number: [id number] Patient Account Number: [id number] Date of Birth/Sex: Treating RN: [birth year] (67 y.o. M) Primary Care Keerthi Hazell: Arva Chafe Other Clinician: Referring Kianna Billet: Treating Geniece Akers/Extender: Vivien Rossetti in Treatment: 7 Visit Information History Since Last Visit All ordered tests and consults were completed: No Patient Arrived: Ambulatory Added or deleted any medications: No Arrival Time: 14:35 Any new allergies or adverse reactions: No Accompanied By: self Had a fall or experienced change in No Transfer Assistance: None activities of daily living that may affect Patient Identification Verified: Yes risk of falls: Secondary Verification Process Completed: Yes Signs or symptoms of abuse/neglect since last visito No Patient Requires Transmission-Based Precautions: No Hospitalized since last visit: No Patient Has Alerts: No Implantable device outside of the clinic excluding No cellular tissue based products placed in the center since last visit: Pain Present Now: No Electronic Signature(s) Signed: 07/21/2022 2:50:46 PM By: Dayton Scrape Entered By: Dayton Scrape on 07/21/2022 14:36:12 -------------------------------------------------------------------------------- Compression Therapy Details Patient Name: Date of Service: SOPHEAK, KISSLING 07/21/2022 2:45 PM Medical Record Number: [id number] Patient Account Number: [id number] Date of Birth/Sex: Treating RN: February 02, 1956 (67 y.o. [name] Primary Care Valor Quaintance: Arva Chafe Other Clinician: Referring Kimberely Mccannon: Treating Aylla Huffine/Extender: Vivien Rossetti in Treatment: 7 Compression Therapy Performed for Wound Assessment:  Wound #1 Right,Anterior Lower Leg Performed By: Clinician Zenaida Deed, RN Compression Type: Double Layer Post Procedure Diagnosis Same as Pre-procedure Notes urgo lite Electronic Signature(s) Signed: 07/21/2022 4:48:54 PM By: Zenaida Deed RN, BSN Entered By: Zenaida Deed on 07/21/2022 14:51:26 Offord, Chales Salmon ([id number]) [id number] Page 2 of 8 -------------------------------------------------------------------------------- Encounter Discharge Information Details Patient Name: Date of Service: EDDRICK, BOLEK 07/21/2022 2:45 PM Medical Record Number: [id number] Patient Account Number: [id number] Date of Birth/Sex: Treating RN: 1955/06/17 (67 y.o. [name] Primary Care Franklyn Cafaro: Arva Chafe Other Clinician: Referring Sayer Masini: Treating Lenor Provencher/Extender: Vivien Rossetti in Treatment: 7 Encounter Discharge Information Items Post Procedure Vitals Discharge Condition: Stable Temperature (F): 97.6 Ambulatory Status: Ambulatory Pulse (bpm): 81 Discharge Destination: Home Respiratory Rate (breaths/min): 18 Transportation: Private Auto Blood Pressure (mmHg): 162/94 Accompanied By: self Schedule Follow-up Appointment: Yes Clinical Summary of Care: Patient Declined Electronic Signature(s) Signed: 07/21/2022 4:48:54 PM By: Zenaida Deed RN, BSN Entered By: Zenaida Deed on 07/21/2022 15:35:31 -------------------------------------------------------------------------------- Lower Extremity Assessment Details Patient Name: Date of Service: [name]. 07/21/2022 2:45 PM Medical Record Number: [id number] Patient Account Number: [id number] Date of Birth/Sex: Treating RN: Mar 04, 1955 (67 y.o. [name] Primary Care Filomeno Cromley: Arva Chafe Other Clinician: Referring Emmons Toth: Treating Dhanvin Szeto/Extender: Vivien Rossetti in Treatment: 7 Edema  Assessment Assessed: [Left: No] [Right: No] Edema: [Left: Ye] [Right: s] Calf Left: Right: Point of Measurement: 30 cm From Medial Instep 37 cm Ankle Left: Right: Point of Measurement: 11 cm From Medial Instep 27 cm Vascular Assessment Pulses: Dorsalis Pedis Palpable: [Right:Yes] Electronic Signature(s) Signed: 07/21/2022 4:48:54 PM By: Zenaida Deed RN, BSN Entered By: Zenaida Deed on 07/21/2022 15:15:59 Mcneish, Chales Salmon ([id number]) [id number] Page 3 of 8 -------------------------------------------------------------------------------- Multi Wound Chart Details Patient Name: Date of Service: TONIE, ADKISSON 07/21/2022 2:45 PM Medical Record Number: [id number] Patient Account Number: [id number] Date of Birth/Sex: Treating RN: 08-17-1955 (67 y.o. M) Primary Care Francesco Provencal: Arva Chafe Other Clinician: Referring Navya Timmons: Treating Puneet Masoner/Extender: Ocie Doyne  Weeks in Treatment: 7 Vital Signs Height(in): 71 Pulse(bpm): 81 Weight(lbs): 247 Blood Pressure(mmHg): 162/94 Body Mass Index(BMI): 34.4 Temperature(F): 97.6 Respiratory Rate(breaths/min): 20 [1:Photos:] [N/A:N/A] Right, Anterior Lower Leg N/A N/A Wound Location: Trauma N/A N/A Wounding Event: Abrasion N/A N/A Primary Etiology: Chronic Obstructive Pulmonary N/A N/A Comorbid History: Disease (COPD), Sleep Apnea, Arrhythmia, Congestive Heart Failure, Hypertension, Osteoarthritis 05/03/2022 N/A N/A Date Acquired: 7 N/A N/A Weeks of Treatment: Open N/A N/A Wound Status: No N/A N/A Wound Recurrence: 9x1.5x0.2 N/A N/A Measurements L x W x D (cm) 10.603 N/A N/A A (cm) : rea 2.121 N/A N/A Volume (cm) : 69.50% N/A N/A % Reduction in A rea: 38.90% N/A N/A % Reduction in Volume: Full Thickness Without Exposed N/A N/A Classification: Support Structures Medium N/A N/A Exudate A mount: Serosanguineous N/A N/A Exudate Type: red, brown N/A  N/A Exudate Color: Flat and Intact N/A N/A Wound Margin: Large (67-100%) N/A N/A Granulation A mount: Red N/A N/A Granulation Quality: Small (1-33%) N/A N/A Necrotic A mount: Fat Layer (Subcutaneous Tissue): Yes N/A N/A Exposed Structures: Fascia: No Tendon: No Muscle: No Joint: No Bone: No Medium (34-66%) N/A N/A Epithelialization: Debridement - Selective/Open Wound N/A N/A Debridement: Pre-procedure Verification/Time Out 15:15 N/A N/A Taken: Lidocaine 5% topical ointment N/A N/A Pain Control: Necrotic/Eschar, Slough N/A N/A Tissue Debrided: Non-Viable Tissue N/A N/A Level: 7.95 N/A N/A Debridement A (sq cm): rea Curette N/A N/A Instrument: Minimum N/A N/A Bleeding: Pressure N/A N/A Hemostasis A chieved: 0 N/A N/A Procedural Pain: 0 N/A N/A Post Procedural Pain: Procedure was tolerated well N/A N/A Debridement Treatment Response: 9x1.5x0.1 N/A N/A Post Debridement Measurements L x W x D (cm) ISACC, VANDERKOOI ([id number]) [id number] Page 4 of 8 1.06 N/A N/A Post Debridement Volume: (cm) No Abnormalities Noted N/A N/A Periwound Skin Texture: No Abnormalities Noted N/A N/A Periwound Skin Moisture: No Abnormalities Noted N/A N/A Periwound Skin Color: No Abnormality N/A N/A Temperature: Compression Therapy N/A N/A Procedures Performed: Debridement Treatment Notes Wound #1 (Lower Leg) Wound Laterality: Right, Anterior Cleanser Soap and Water Discharge Instruction: May shower and wash wound with dial antibacterial soap and water prior to dressing change. Vashe 5.8 (oz) Discharge Instruction: Cleanse the wound with Vashe prior to applying a clean dressing using gauze sponges, not tissue or cotton balls. Peri-Wound Care Triamcinolone 15 (g) Discharge Instruction: Use triamcinolone 15 (g) as directed Sween Lotion (Moisturizing lotion) Discharge Instruction: Apply moisturizing lotion as directed Topical Primary  Dressing Hydrofera Blue Classic Foam, 4x4 in Discharge Instruction: Moisten with saline prior to applying to wound bed Secondary Dressing Woven Gauze Sponge, Non-Sterile 4x4 in Discharge Instruction: Apply over primary dressing as directed. Secured With Compression Wrap Urgo K2 Lite, (equivalent to a 3 layer) two layer compression system, regular Discharge Instruction: Apply Urgo K2 Lite as directed (alternative to 3 layer compression). Compression Stockings Add-Ons Electronic Signature(s) Signed: 07/21/2022 3:37:49 PM By: Duanne Guess MD FACS Entered By: Duanne Guess on 07/21/2022 15:37:48 -------------------------------------------------------------------------------- Multi-Disciplinary Care Plan Details Patient Name: Date of Service: [name] 07/21/2022 2:45 PM Medical Record Number: [id number] Patient Account Number: [id number] Date of Birth/Sex: Treating RN: 02/19/1955 (67 y.o. [name] Primary Care Mayjor Ager: Arva Chafe Other Clinician: Referring Wanisha Shiroma: Treating Danyelle Brookover/Extender: Vivien Rossetti in Treatment: 7 Multidisciplinary Care Plan reviewed with physician Active Inactive Venous Leg Ulcer Nursing Diagnoses: Knowledge deficit related to disease process and management SHIGEO, ROSEMANN ([id number]) [phone].pdf Page 5 of 8 Potential for venous Insuffiency (use before diagnosis confirmed) Goals: Patient will maintain  optimal edema control Date Initiated: 06/30/2022 Target Resolution Date: 08/18/2022 Goal Status: Active Interventions: Assess peripheral edema status every visit. Compression as ordered Treatment Activities: Therapeutic compression applied : 06/30/2022 Notes: Wound/Skin Impairment Nursing Diagnoses: Impaired tissue integrity Knowledge deficit related to ulceration/compromised skin integrity Goals: Patient/caregiver will verbalize understanding of skin care  regimen Date Initiated: 06/02/2022 Target Resolution Date: 08/18/2022 Goal Status: Active Ulcer/skin breakdown will have a volume reduction of 30% by week 4 Date Initiated: 06/02/2022 Date Inactivated: 06/30/2022 Target Resolution Date: 06/30/2022 Goal Status: Met Ulcer/skin breakdown will have a volume reduction of 50% by week 8 Date Initiated: 06/30/2022 Date Inactivated: 07/21/2022 Target Resolution Date: 07/28/2022 Goal Status: Met Ulcer/skin breakdown will have a volume reduction of 80% by week 12 Date Initiated: 07/21/2022 Target Resolution Date: 08/25/2022 Goal Status: Active Interventions: Assess patient/caregiver ability to obtain necessary supplies Assess patient/caregiver ability to perform ulcer/skin care regimen upon admission and as needed Assess ulceration(s) every visit Provide education on ulcer and skin care Notes: Electronic Signature(s) Signed: 07/21/2022 4:48:54 PM By: Zenaida Deed RN, BSN Entered By: Zenaida Deed on 07/21/2022 14:50:20 -------------------------------------------------------------------------------- Pain Assessment Details Patient Name: Date of Service: [name]. 07/21/2022 2:45 PM Medical Record Number: [id number] Patient Account Number: [id number] Date of Birth/Sex: Treating RN: Jan 08, 1956 (67 y.o. M) Primary Care [place] Every: Arva Chafe Other Clinician: Referring Valoria Tamburri: Treating Ezequiel Macauley/Extender: Vivien Rossetti in Treatment: 7 Active Problems Location of Pain Severity and Description of Pain Patient Has Paino No Site Locations Zarak, Caton Booth K ([id number]) [id number] Page 6 of 8 Pain Management and Medication Current Pain Management: Electronic Signature(s) Signed: 07/21/2022 2:50:46 PM By: Dayton Scrape Entered By: Dayton Scrape on 07/21/2022 14:36:40 -------------------------------------------------------------------------------- Patient/Caregiver Education  Details Patient Name: Date of Service: [name] 6/13/2024andnbsp2:45 PM Medical Record Number: [id number] Patient Account Number: [id number] Date of Birth/Gender: Treating RN: Feb 20, 1955 (67 y.o. [name] Primary Care Physician: Arva Chafe Other Clinician: Referring Physician: Treating Physician/Extender: Vivien Rossetti in Treatment: 7 Education Assessment Education Provided To: Patient Education Topics Provided Venous: Methods: Explain/Verbal Responses: Reinforcements needed, State content correctly Wound/Skin Impairment: Methods: Explain/Verbal Responses: Reinforcements needed, State content correctly Electronic Signature(s) Signed: 07/21/2022 4:48:54 PM By: Zenaida Deed RN, BSN Entered By: Zenaida Deed on 07/21/2022 14:50:39 -------------------------------------------------------------------------------- Wound Assessment Details Patient Name: Date of Service: Vear Clock, RO BERT K. 07/21/2022 2:45 PM Marney Setting ([id number]) [id number] Page 7 of 8 Medical Record Number: [id number] Patient Account Number: [id number] Date of Birth/Sex: Treating RN: 08/23/1955 (67 y.o. M) Primary Care Eden Toohey: Arva Chafe Other Clinician: Referring Vali Capano: Treating Tyleah Loh/Extender: Vivien Rossetti in Treatment: 7 Wound Status Wound Number: 1 Primary Abrasion Etiology: Wound Location: Right, Anterior Lower Leg Wound Open Wounding Event: Trauma Status: Date Acquired: 05/03/2022 Comorbid Chronic Obstructive Pulmonary Disease (COPD), Sleep Apnea, Weeks Of Treatment: 7 History: Arrhythmia, Congestive Heart Failure, Hypertension, Osteoarthritis Clustered Wound: No Photos Wound Measurements Length: (cm) 9 Width: (cm) 1.5 Depth: (cm) 0.2 Area: (cm) 10.603 Volume: (cm) 2.121 % Reduction in Area: 69.5% % Reduction in Volume: 38.9% Epithelialization: Medium  (34-66%) Tunneling: No Undermining: No Wound Description Classification: Full Thickness Without Exposed Support Structures Wound Margin: Flat and Intact Exudate Amount: Medium Exudate Type: Serosanguineous Exudate Color: red, brown Foul Odor After Cleansing: No Slough/Fibrino Yes Wound Bed Granulation Amount: Large (67-100%) Exposed Structure Granulation Quality: Red Fascia Exposed: No Necrotic Amount: Small (1-33%) Fat Layer (Subcutaneous Tissue) Exposed: Yes Necrotic Quality: Adherent Slough Tendon Exposed: No Muscle Exposed: No Joint Exposed: No Bone Exposed:  No Periwound Skin Texture Texture Color No Abnormalities Noted: Yes No Abnormalities Noted: Yes Moisture Temperature / Pain No Abnormalities Noted: Yes Temperature: No Abnormality Treatment Notes Wound #1 (Lower Leg) Wound Laterality: Right, Anterior Cleanser Soap and Water Discharge Instruction: May shower and wash wound with dial antibacterial soap and water prior to dressing change. Vashe 5.8 (oz) Discharge Instruction: Cleanse the wound with Vashe prior to applying a clean dressing using gauze sponges, not tissue or cotton balls. Peri-Wound Care Triamcinolone 15 (g) Discharge Instruction: Use triamcinolone 15 (g) as directed Sween Lotion (Moisturizing lotion) Discharge Instruction: Apply moisturizing lotion as directed JAPETH, DULSKI ([id number]) [id number] Page 8 of 8 Topical Primary Dressing Hydrofera Blue Classic Foam, 4x4 in Discharge Instruction: Moisten with saline prior to applying to wound bed Secondary Dressing Woven Gauze Sponge, Non-Sterile 4x4 in Discharge Instruction: Apply over primary dressing as directed. Secured With Compression Wrap Urgo K2 Lite, (equivalent to a 3 layer) two layer compression system, regular Discharge Instruction: Apply Urgo K2 Lite as directed (alternative to 3 layer compression). Compression Stockings Add-Ons Electronic  Signature(s) Signed: 07/21/2022 4:48:54 PM By: Zenaida Deed RN, BSN Previous Signature: 07/21/2022 2:50:46 PM Version By: Dayton Scrape Entered By: Zenaida Deed on 07/21/2022 15:16:45 -------------------------------------------------------------------------------- Vitals Details Patient Name: Date of Service: [name]. 07/21/2022 2:45 PM Medical Record Number: [id number] Patient Account Number: [id number] Date of Birth/Sex: Treating RN: [birth year] (66 y.o. M) Primary Care Marguita Venning: Arva Chafe Other Clinician: Referring Viann Nielson: Treating Berenis Corter/Extender: Vivien Rossetti in Treatment: 7 Vital Signs Time Taken: 02:36 Temperature (F): 97.6 Height (in): 71 Pulse (bpm): 81 Weight (lbs): 247 Respiratory Rate (breaths/min): 20 Body Mass Index (BMI): 34.4 Blood Pressure (mmHg): 162/94 Reference Range: 80 - 120 mg / dl Electronic Signature(s) Signed: 07/21/2022 2:50:46 PM By: Dayton Scrape Entered By: Dayton Scrape on 07/21/2022 14:36:34

## 2022-07-26 ENCOUNTER — Other Ambulatory Visit: Payer: Self-pay | Admitting: Family Medicine

## 2022-07-26 DIAGNOSIS — J42 Unspecified chronic bronchitis: Secondary | ICD-10-CM

## 2022-07-27 ENCOUNTER — Ambulatory Visit (INDEPENDENT_AMBULATORY_CARE_PROVIDER_SITE_OTHER): Payer: PPO | Admitting: *Deleted

## 2022-07-27 ENCOUNTER — Other Ambulatory Visit: Payer: Self-pay | Admitting: Family Medicine

## 2022-07-27 DIAGNOSIS — Z0000 Encounter for general adult medical examination without abnormal findings: Secondary | ICD-10-CM | POA: Diagnosis not present

## 2022-07-27 DIAGNOSIS — J42 Unspecified chronic bronchitis: Secondary | ICD-10-CM

## 2022-07-27 NOTE — Patient Instructions (Signed)
Mitchell Parrish , Thank you for taking time to come for your Medicare Wellness Visit. I appreciate your ongoing commitment to your health goals. Please review the following plan we discussed and let me know if I can assist you in the future.   These are the goals we discussed:  Goals   None     This is a list of the screening recommended for you and due dates:  Health Maintenance  Topic Date Due   Pneumonia Vaccine (2 of 2 - PCV) 03/24/2017   Screening for Lung Cancer  12/29/2020   Colon Cancer Screening  11/13/2021   COVID-19 Vaccine (5 - 2023-24 season) 11/19/2022*   Flu Shot  09/08/2022   Medicare Annual Wellness Visit  07/27/2023   DTaP/Tdap/Td vaccine (2 - Td or Tdap) 12/13/2028   Hepatitis C Screening  Completed   Zoster (Shingles) Vaccine  Completed   HPV Vaccine  Aged Out  *Topic was postponed. The date shown is not the original due date.     Next appointment: Follow up in one year for your annual wellness visit.   Preventive Care 66 Years and Older, Male Preventive care refers to lifestyle choices and visits with your health care provider that can promote health and wellness. What does preventive care include? A yearly physical exam. This is also called an annual well check. Dental exams once or twice a year. Routine eye exams. Ask your health care provider how often you should have your eyes checked. Personal lifestyle choices, including: Daily care of your teeth and gums. Regular physical activity. Eating a healthy diet. Avoiding tobacco and drug use. Limiting alcohol use. Practicing safe sex. Taking low doses of aspirin every day. Taking vitamin and mineral supplements as recommended by your health care provider. What happens during an annual well check? The services and screenings done by your health care provider during your annual well check will depend on your age, overall health, lifestyle risk factors, and family history of disease. Counseling  Your health  care provider may ask you questions about your: Alcohol use. Tobacco use. Drug use. Emotional well-being. Home and relationship well-being. Sexual activity. Eating habits. History of falls. Memory and ability to understand (cognition). Work and work Astronomer. Screening  You may have the following tests or measurements: Height, weight, and BMI. Blood pressure. Lipid and cholesterol levels. These may be checked every 5 years, or more frequently if you are over 59 years old. Skin check. Lung cancer screening. You may have this screening every year starting at age 32 if you have a 30-pack-year history of smoking and currently smoke or have quit within the past 15 years. Fecal occult blood test (FOBT) of the stool. You may have this test every year starting at age 31. Flexible sigmoidoscopy or colonoscopy. You may have a sigmoidoscopy every 5 years or a colonoscopy every 10 years starting at age 5. Prostate cancer screening. Recommendations will vary depending on your family history and other risks. Hepatitis C blood test. Hepatitis B blood test. Sexually transmitted disease (STD) testing. Diabetes screening. This is done by checking your blood sugar (glucose) after you have not eaten for a while (fasting). You may have this done every 1-3 years. Abdominal aortic aneurysm (AAA) screening. You may need this if you are a current or former smoker. Osteoporosis. You may be screened starting at age 72 if you are at high risk. Talk with your health care provider about your test results, treatment options, and if necessary, the need  for more tests. Vaccines  Your health care provider may recommend certain vaccines, such as: Influenza vaccine. This is recommended every year. Tetanus, diphtheria, and acellular pertussis (Tdap, Td) vaccine. You may need a Td booster every 10 years. Zoster vaccine. You may need this after age 24. Pneumococcal 13-valent conjugate (PCV13) vaccine. One dose is  recommended after age 77. Pneumococcal polysaccharide (PPSV23) vaccine. One dose is recommended after age 86. Talk to your health care provider about which screenings and vaccines you need and how often you need them. This information is not intended to replace advice given to you by your health care provider. Make sure you discuss any questions you have with your health care provider. Document Released: 02/20/2015 Document Revised: 10/14/2015 Document Reviewed: 11/25/2014 Elsevier Interactive Patient Education  2017 [employer] Prevention in the Home Falls can cause injuries. They can happen to people of all ages. There are many things you can do to make your home safe and to help prevent falls. What can I do on the outside of my home? Regularly fix the edges of walkways and driveways and fix any cracks. Remove anything that might make you trip as you walk through a door, such as a raised step or threshold. Trim any bushes or trees on the path to your home. Use bright outdoor lighting. Clear any walking paths of anything that might make someone trip, such as rocks or tools. Regularly check to see if handrails are loose or broken. Make sure that both sides of any steps have handrails. Any raised decks and porches should have guardrails on the edges. Have any leaves, snow, or ice cleared regularly. Use sand or salt on walking paths during winter. Clean up any spills in your garage right away. This includes oil or grease spills. What can I do in the bathroom? Use night lights. Install grab bars by the toilet and in the tub and shower. Do not use towel bars as grab bars. Use non-skid mats or decals in the tub or shower. If you need to sit down in the shower, use a plastic, non-slip stool. Keep the floor dry. Clean up any water that spills on the floor as soon as it happens. Remove soap buildup in the tub or shower regularly. Attach bath mats securely with double-sided non-slip rug  tape. Do not have throw rugs and other things on the floor that can make you trip. What can I do in the bedroom? Use night lights. Make sure that you have a light by your bed that is easy to reach. Do not use any sheets or blankets that are too big for your bed. They should not hang down onto the floor. Have a firm chair that has side arms. You can use this for support while you get dressed. Do not have throw rugs and other things on the floor that can make you trip. What can I do in the kitchen? Clean up any spills right away. Avoid walking on wet floors. Keep items that you use a lot in easy-to-reach places. If you need to reach something above you, use a strong step stool that has a grab bar. Keep electrical cords out of the way. Do not use floor polish or wax that makes floors slippery. If you must use wax, use non-skid floor wax. Do not have throw rugs and other things on the floor that can make you trip. What can I do with my stairs? Do not leave any items on the stairs.  Make sure that there are handrails on both sides of the stairs and use them. Fix handrails that are broken or loose. Make sure that handrails are as long as the stairways. Check any carpeting to make sure that it is firmly attached to the stairs. Fix any carpet that is loose or worn. Avoid having throw rugs at the top or bottom of the stairs. If you do have throw rugs, attach them to the floor with carpet tape. Make sure that you have a light switch at the top of the stairs and the bottom of the stairs. If you do not have them, ask someone to add them for you. What else can I do to help prevent falls? Wear shoes that: Do not have high heels. Have rubber bottoms. Are comfortable and fit you well. Are closed at the toe. Do not wear sandals. If you use a stepladder: Make sure that it is fully opened. Do not climb a closed stepladder. Make sure that both sides of the stepladder are locked into place. Ask someone to  hold it for you, if possible. Clearly mark and make sure that you can see: Any grab bars or handrails. First and last steps. Where the edge of each step is. Use tools that help you move around (mobility aids) if they are needed. These include: Canes. Walkers. Scooters. Crutches. Turn on the lights when you go into a dark area. Replace any light bulbs as soon as they burn out. Set up your furniture so you have a clear path. Avoid moving your furniture around. If any of your floors are uneven, fix them. If there are any pets around you, be aware of where they are. Review your medicines with your doctor. Some medicines can make you feel dizzy. This can increase your chance of falling. Ask your doctor what other things that you can do to help prevent falls. This information is not intended to replace advice given to you by your health care provider. Make sure you discuss any questions you have with your health care provider. Document Released: 11/20/2008 Document Revised: 07/02/2015 Document Reviewed: 02/28/2014 Elsevier Interactive Patient Education  2017 [employer].

## 2022-07-27 NOTE — Progress Notes (Signed)
Subjective:   Mitchell Parrish is a 67 y.o. male who presents for an Initial Medicare Annual Wellness Visit.  Visit Complete: Virtual  I connected with  Marney Setting on 07/27/22 by a audio enabled telemedicine application and verified that I am speaking with the correct person using two identifiers.  Patient Location: Home  Provider Location: Office/Clinic  I discussed the limitations of evaluation and management by telemedicine. The patient expressed understanding and agreed to proceed.   Review of Systems     Cardiac Risk Factors include: male gender;smoking/ tobacco exposure;dyslipidemia;hypertension;advanced age (>18men, >77 women)     Objective:    Today's Vitals   There is no height or weight on file to calculate BMI.     07/27/2022    3:38 PM 05/16/2022   12:45 PM 05/03/2022    5:50 PM 04/03/2019    7:04 AM 01/28/2019    6:52 PM 01/24/2019    1:30 PM 06/26/2018    7:45 AM  Advanced Directives  Does Patient Have a Medical Advance Directive? No No No No No No No  Would patient like information on creating a medical advance directive? No - Patient declined No - Patient declined No - Patient declined No - Patient declined No - Patient declined No - Patient declined No - Patient declined    Current Medications (verified) Outpatient Encounter Medications as of 07/27/2022  Medication Sig   albuterol (VENTOLIN HFA) 108 (90 Base) MCG/ACT inhaler INHALE 2 PUFFS INTO THE LUNGS EVERY 4 HOURS AS NEEDED FOR WHEEZING OR SHORTNESS OF BREATH   co-enzyme Q-10 30 MG capsule Take 200 mg by mouth daily.   COVID-19 mRNA bivalent vaccine, Pfizer, (PFIZER COVID-19 VAC BIVALENT) injection Inject into the muscle.   dicyclomine (BENTYL) 20 MG tablet Take 1 tablet (20 mg total) by mouth 2 (two) times daily.   fluticasone (FLONASE) 50 MCG/ACT nasal spray Place 2 sprays into both nostrils as needed for allergies or rhinitis.   furosemide (LASIX) 40 MG tablet Take 1 tablet (40 mg total) by  mouth daily as needed.   INCRUSE ELLIPTA 62.5 MCG/ACT AEPB INHALE 1 PUFF INTO THE LUNGS DAILY   losartan (COZAAR) 100 MG tablet Take 1 tablet (100 mg total) by mouth daily.   metoprolol succinate (TOPROL-XL) 25 MG 24 hr tablet Take 1 tablet (25 mg total) by mouth 2 (two) times daily. Please call [phone] to schedule an appointment for September for future refills. Thank you.   potassium chloride (KLOR-CON) 10 MEQ tablet TAKE 1 TABLET BY MOUTH DAILY AS NEEDED WITH LASIX   SYMBICORT 160-4.5 MCG/ACT inhaler INHALE 1 PUFF INTO THE LUNGS IN THE MORNING AND AT BEDTIME   tiotropium (SPIRIVA HANDIHALER) 18 MCG inhalation capsule Place 1 capsule (18 mcg total) into inhaler and inhale daily.   tiZANidine (ZANAFLEX) 2 MG tablet Take 1-2 tablets (2-4 mg total) by mouth every 6 (six) hours as needed for muscle spasms.   No facility-administered encounter medications on file as of 07/27/2022.    Allergies (verified) Lodine [etodolac]   History: Past Medical History:  Diagnosis Date   Acute diastolic CHF (congestive heart failure) (HCC) 05/29/2018   Arthritis    left hip   Atrial fibrillation (HCC)    Emphysema lung (HCC)    Hyperlipidemia    Hypertension    Tubular adenoma of colon 11/2011   Past Surgical History:  Procedure Laterality Date   ATRIAL FIBRILLATION ABLATION N/A 04/03/2019   Procedure: ATRIAL FIBRILLATION ABLATION;  Surgeon: Elberta Fortis, Will  Daphine Deutscher, MD;  Location: [facility] INVASIVE CV LAB;  Service: Cardiovascular;  Laterality: N/A;   BACK SURGERY     CARDIOVERSION N/A 06/25/2018   Procedure: CARDIOVERSION (CATH LAB);  Surgeon: Marinus Maw, MD;  Location: [facility] INVASIVE CV LAB;  Service: Cardiovascular;  Laterality: N/A;   HERNIA REPAIR     SPINE SURGERY     cervical fusion c6, c7 (1994), and c3,4,5,6 (2004)   TOTAL KNEE ARTHROPLASTY Right 01/28/2019   Procedure: TOTAL KNEE ARTHROPLASTY;  Surgeon: Ollen Gross, MD;  Location: [facility] ORS;  Service: Orthopedics;  Laterality: Right;   WISDOM  TOOTH EXTRACTION     Family History  Problem Relation Age of Onset   Diabetes Mother    Lung cancer Mother 38   Diabetes Father    Heart disease Father    Colon cancer Father 53       rectal cancer   Social History   Socioeconomic History   Marital status: Married    Spouse name: Not on file   Number of children: 4   Years of education: Not on file   Highest education level: 12th grade  Occupational History   Occupation: Environmental education officer: [employer]  Tobacco Use   Smoking status: Some Days    Packs/day: 0.50    Years: 45.00    Additional pack years: 0.00    Total pack years: 22.50    Types: Cigarettes   Smokeless tobacco: Never  Vaping Use   Vaping Use: Never used  Substance and Sexual Activity   Alcohol use: Yes    Alcohol/week: 1.0 - 2.0 standard drink of alcohol    Types: 1 - 2 Cans of beer per week    Comment: 2-3 drinks/ day   Drug use: No   Sexual activity: Yes  Other Topics Concern   Not on file  Social History Narrative   Not on file   Social Determinants of Health   Financial Resource Strain: Low Risk  (07/27/2022)   Overall Financial Resource Strain (CARDIA)    Difficulty of Paying Living Expenses: Not hard at all  Food Insecurity: No Food Insecurity (05/19/2022)   Hunger Vital Sign    Worried About Running Out of Food in the Last Year: Never true    Ran Out of Food in the Last Year: Never true  Transportation Needs: No Transportation Needs (05/19/2022)   PRAPARE - Administrator, Civil Service (Medical): No    Lack of Transportation (Non-Medical): No  Physical Activity: Unknown (05/19/2022)   Exercise Vital Sign    Days of Exercise per Week: 0 days    Minutes of Exercise per Session: Not on file  Stress: No Stress Concern Present (05/19/2022)   [employer] of Occupational Health - Occupational Stress Questionnaire    Feeling of Stress : Not at all  Social Connections: Unknown (05/19/2022)   Social Connection and Isolation  Panel [NHANES]    Frequency of Communication with Friends and Family: Patient declined    Frequency of Social Gatherings with Friends and Family: Patient declined    Attends Religious Services: Patient declined    Database administrator or Organizations: Patient declined    Attends Engineer, structural: Not on file    Marital Status: Married    Tobacco Counseling Ready to quit: Not Answered Counseling given: Not Answered   Clinical Intake:  Pre-visit preparation completed: Yes  Pain : No/denies pain  Nutritional Risks: None Diabetes: No  How  often do you need to have someone help you when you read instructions, pamphlets, or other written materials from your doctor or pharmacy?: 1 - Never  Interpreter Needed?: No  Information entered by :: Donne Anon, CMA   Activities of Daily Living    07/27/2022    3:44 PM  In your present state of health, do you have any difficulty performing the following activities:  Hearing? 0  Vision? 0  Difficulty concentrating or making decisions? 0  Walking or climbing stairs? 0  Dressing or bathing? 0  Doing errands, shopping? 0  Preparing Food and eating ? N  Using the Toilet? N  In the past six months, have you accidently leaked urine? Y  Do you have problems with loss of bowel control? N  Managing your Medications? N  Managing your Finances? N  Housekeeping or managing your Housekeeping? N    Patient Care Team: Sharlene Dory, DO as PCP - General (Family Medicine) Regan Lemming, MD as PCP - Electrophysiology (Cardiology)  Indicate any recent Medical Services you may have received from other than [facility] providers in the past year (date may be approximate).     Assessment:   This is a routine wellness examination for [place].  Hearing/Vision screen No results found.  Dietary issues and exercise activities discussed:     Goals Addressed   None    Depression Screen    07/27/2022    3:43 PM  05/20/2022    8:45 AM 06/23/2020    2:33 PM 12/25/2019    2:35 PM 05/05/2016    2:59 PM  PHQ 2/9 Scores  PHQ - 2 Score 0 0 0 0 0  PHQ- 9 Score     0    Fall Risk    07/27/2022    3:48 PM 06/23/2020    2:33 PM  Fall Risk   Falls in the past year? 0 0  Number falls in past yr: 0 0  Injury with Fall? 0 0  Risk for fall due to : No Fall Risks   Follow up Falls evaluation completed Falls evaluation completed    MEDICARE RISK AT HOME:  Medicare Risk at Home - 07/27/22 1547     Any stairs in or around the home? Yes    If so, are there any without handrails? No    Home free of loose throw rugs in walkways, pet beds, electrical cords, etc? Yes    Adequate lighting in your home to reduce risk of falls? Yes    Life alert? No    Use of a cane, walker or w/c? No    Grab bars in the bathroom? Yes    Shower chair or bench in shower? Yes    Elevated toilet seat or a handicapped toilet? Yes             TIMED UP AND GO:  Was the test performed? No    Cognitive Function:        07/27/2022    3:49 PM  6CIT Screen  What Year? 0 points  What month? 0 points  What time? 0 points  Count back from 20 0 points  Months in reverse 0 points  Repeat phrase 0 points  Total Score 0 points    Immunizations Immunization History  Administered Date(s) Administered   Influenza,inj,Quad PF,6+ Mos 01/13/2016, 11/21/2016, 12/14/2018, 12/25/2019, 01/12/2021   Influenza,inj,quad, With Preservative 12/10/2018   Influenza-Unspecified 12/21/2017   PFIZER(Purple Top)SARS-COV-2 Vaccination 04/22/2019, 05/14/2019, 11/23/2019  Research officer, trade union 76yrs & up 01/12/2021   Pneumococcal Polysaccharide-23 03/24/2016   Tdap 12/14/2018   Tetanus 01/13/2008   Zoster Recombinat (Shingrix) 12/14/2018, 03/06/2019    TDAP status: Up to date  Flu Vaccine status: Up to date  Pneumococcal vaccine status: Due, Education has been provided regarding the importance of this vaccine. Advised  may receive this vaccine at local pharmacy or Health Dept. Aware to provide a copy of the vaccination record if obtained from local pharmacy or Health Dept. Verbalized acceptance and understanding.  Covid-19 vaccine status: Information provided on how to obtain vaccines.   Qualifies for Shingles Vaccine? Yes   Zostavax completed No   Shingrix Completed?: Yes  Screening Tests Health Maintenance  Topic Date Due   Medicare Annual Wellness (AWV)  Never done   Pneumonia Vaccine 78+ Years old (2 of 2 - PCV) 03/24/2017   Lung Cancer Screening  12/29/2020   Colonoscopy  11/13/2021   COVID-19 Vaccine (5 - 2023-24 season) 11/19/2022 (Originally 10/08/2021)   INFLUENZA VACCINE  09/08/2022   DTaP/Tdap/Td (2 - Td or Tdap) 12/13/2028   Hepatitis C Screening  Completed   Zoster Vaccines- Shingrix  Completed   HPV VACCINES  Aged Out    Health Maintenance  Health Maintenance Due  Topic Date Due   Medicare Annual Wellness (AWV)  Never done   Pneumonia Vaccine 71+ Years old (2 of 2 - PCV) 03/24/2017   Lung Cancer Screening  12/29/2020   Colonoscopy  11/13/2021    Colorectal cancer screening: Type of screening: Colonoscopy. Completed 11/14/18. Repeat every 3 years  Lung Cancer Screening: (Low Dose CT Chest recommended if Age 41-80 years, 20 pack-year currently smoking OR have quit w/in 15years.) does qualify.   Lung Cancer Screening Referral: pt declined at this time  Additional Screening:  Hepatitis C Screening: does qualify; Completed 02/07/14  Vision Screening: Recommended annual ophthalmology exams for early detection of glaucoma and other disorders of the eye. Is the patient up to date with their annual eye exam?  Yes  Who is the provider or what is the name of the office in which the patient attends annual eye exams? Dr. Emi Holes - [facility] If pt is not established with a provider, would they like to be referred to a provider to establish care? No .   Dental Screening:  Recommended annual dental exams for proper oral hygiene  Diabetic Foot Exam: N/a  Community Resource Referral / Chronic Care Management: CRR required this visit?  No   CCM required this visit?  No    Plan:     I have personally reviewed and noted the following in the patient's chart:   Medical and social history Use of alcohol, tobacco or illicit drugs  Current medications and supplements including opioid prescriptions. Patient is not currently taking opioid prescriptions. Functional ability and status Nutritional status Physical activity Advanced directives List of other physicians Hospitalizations, surgeries, and ER visits in previous 12 months Vitals Screenings to include cognitive, depression, and falls Referrals and appointments  In addition, I have reviewed and discussed with patient certain preventive protocols, quality metrics, and best practice recommendations. A written personalized care plan for preventive services as well as general preventive health recommendations were provided to patient.     Donne Anon, CMA   07/27/2022   After Visit Summary: (MyChart) Due to this being a telephonic visit, the after visit summary with patients personalized plan was offered to patient via MyChart   Nurse Notes:  None

## 2022-07-28 ENCOUNTER — Encounter (HOSPITAL_BASED_OUTPATIENT_CLINIC_OR_DEPARTMENT_OTHER): Payer: PPO | Admitting: General Surgery

## 2022-07-28 DIAGNOSIS — L97812 Non-pressure chronic ulcer of other part 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: Secondary | ICD-10-CM | POA: Diagnosis not present

## 2022-07-28 NOTE — Progress Notes (Signed)
[name], [name] ([id number]) [id number] Page 1 of 1 Visit Report for 07/28/2022 SuperBill Details Patient Name: Date of Service: Mitchell Parrish, Mitchell Parrish 07/28/2022 Medical Record Number: [id number] Patient Account Number: [id number] Date of Birth/Sex: Treating RN: 01-23-1956 (67 y.o. [name] Primary Care Provider: Arva Chafe Other Clinician: Referring Provider: Treating Provider/Extender: Vivien Rossetti in Treatment: 8 Diagnosis Coding ICD-10 Codes Code Description [phone] Non-pressure chronic ulcer of other part 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 I50.32 Chronic diastolic (congestive) heart failure J44.9 Chronic obstructive pulmonary disease, unspecified I10 Essential (primary) hypertension Z72.0 Tobacco use Facility Procedures CPT4 Code Description Modifier Quantity [id number] (Facility Use Only) [phone] - APPLY MULTLAY COMPRS LWR RT LEG 1 Electronic Signature(s) Signed: 07/28/2022 3:25:23 PM By: Duanne Guess MD FACS Signed: 07/28/2022 4:29:08 PM By: Zenaida Deed RN, BSN Entered By: Zenaida Deed on 07/28/2022 15:17:31

## 2022-07-28 NOTE — Progress Notes (Signed)
Mitchell Parrish ([id number]) [id number] Page 1 of 4 Visit Report for 07/28/2022 Arrival Information Details Patient Name: Date of Service: Mitchell Parrish, Mitchell Parrish 07/28/2022 2:45 PM Medical Record Number: [id number] Patient Account Number: [id number] Date of Birth/Sex: Treating RN: 29-Nov-1955 (67 y.o. Mitchell Parrish, Mitchell Parrish Primary Care Vedder Brittian: Arva Chafe Other Clinician: Referring Korban Shearer: Treating Nicholai Willette/Extender: Vivien Rossetti in Treatment: 8 Visit Information History Since Last Visit Added or deleted any medications: No Patient Arrived: Ambulatory Any new allergies or adverse reactions: No Arrival Time: 15:14 Had a fall or experienced change in No Accompanied By: self activities of daily living that may affect Transfer Assistance: None risk of falls: Patient Identification Verified: Yes Signs or symptoms of abuse/neglect since last visito No Secondary Verification Process Completed: Yes Hospitalized since last visit: No Patient Requires Transmission-Based Precautions: No Implantable device outside of the clinic excluding No Patient Has Alerts: No cellular tissue based products placed in the center since last visit: Has Dressing in Place as Prescribed: Yes Has Compression in Place as Prescribed: Yes Pain Present Now: No Electronic Signature(s) Signed: 07/28/2022 4:29:08 PM By: Zenaida Deed RN, BSN Entered By: Zenaida Deed on 07/28/2022 15:15:28 -------------------------------------------------------------------------------- Compression Therapy Details Patient Name: Date of Service: Mitchell Parrish. 07/28/2022 2:45 PM Medical Record Number: [id number] Patient Account Number: [id number] Date of Birth/Sex: Treating RN: August 10, 1955 (67 y.o. Mitchell Parrish Primary Care Lovell Nuttall: Arva Chafe Other Clinician: Referring Kiona Blume: Treating [name]/Extender: Vivien Rossetti  in Treatment: 8 Compression Therapy Performed for Wound Assessment: Wound #1 Right,Anterior Lower Leg Performed By: Clinician Zenaida Deed, RN Compression Type: Double Layer Notes Stephannie Li Electronic Signature(s) Signed: 07/28/2022 4:29:08 PM By: Zenaida Deed RN, BSN Entered By: Zenaida Deed on 07/28/2022 15:16:22 Mitchell Parrish ([id number]) [id number] Page 2 of 4 -------------------------------------------------------------------------------- Encounter Discharge Information Details Patient Name: Date of Service: Mitchell, Parrish 07/28/2022 2:45 PM Medical Record Number: [id number] Patient Account Number: [id number] Date of Birth/Sex: Treating RN: 08-19-1955 (66 y.o. Mitchell Parrish Primary Care Clela Hagadorn: Arva Chafe Other Clinician: Referring Luz Mares: Treating Mitchell Parrish/Extender: Vivien Rossetti in Treatment: 8 Encounter Discharge Information Items Discharge Condition: Stable Ambulatory Status: Ambulatory Discharge Destination: Home Transportation: Private Auto Accompanied By: self Schedule Follow-up Appointment: Yes Clinical Summary of Care: Patient Declined Electronic Signature(s) Signed: 07/28/2022 4:29:08 PM By: Zenaida Deed RN, BSN Entered By: Zenaida Deed on 07/28/2022 15:17:19 -------------------------------------------------------------------------------- Patient/Caregiver Education Details Patient Name: Date of Service: Mitchell Parrish 6/20/2024andnbsp2:45 PM Medical Record Number: [id number] Patient Account Number: [id number] Date of Birth/Gender: Treating RN: 10-Apr-1955 (67 y.o. Mitchell Parrish Primary Care Physician: Arva Chafe Other Clinician: Referring Physician: Treating Physician/Extender: Vivien Rossetti in Treatment: 8 Education Assessment Education Provided To: Patient Education Topics Provided Venous: Methods:  Explain/Verbal Responses: Reinforcements needed, State content correctly Wound/Skin Impairment: Methods: Explain/Verbal Responses: Reinforcements needed, State content correctly Electronic Signature(s) Signed: 07/28/2022 4:29:08 PM By: Zenaida Deed RN, BSN Entered By: Zenaida Deed on 07/28/2022 15:17:03 -------------------------------------------------------------------------------- Wound Assessment Details Patient Name: Date of Service: Mitchell Parrish. 07/28/2022 2:45 PM Medical Record Number: [id number] Patient Account Number: [id number] Date of Birth/Sex: Treating RN: 1955/12/05 (67 y.o. Mitchell Parrish Primary Care Janes Colegrove: Arva Chafe Other Clinician: [name], [name] ([id number]) [id number] Page 3 of 4 Referring Estefani Bateson: Treating Shaily Librizzi/Extender: Vivien Rossetti in Treatment: 8 Wound Status Wound Number: 1 Primary Abrasion Etiology: Wound Location: Right, Anterior Lower Leg Wound Open Wounding Event: Trauma Status: Date Acquired: 05/03/2022 Comorbid Chronic Obstructive Pulmonary  Disease (COPD), Sleep Apnea, Weeks Of Treatment: 8 History: Arrhythmia, Congestive Heart Failure, Hypertension, Osteoarthritis Clustered Wound: No Wound Measurements Length: (cm) 9 Width: (cm) 1.5 Depth: (cm) 0.2 Area: (cm) 10.603 Volume: (cm) 2.121 % Reduction in Area: 69.5% % Reduction in Volume: 38.9% Epithelialization: Large (67-100%) Tunneling: No Undermining: No Wound Description Classification: Full Thickness Without Exposed Support Structures Wound Margin: Flat and Intact Exudate Amount: Small Exudate Type: Serosanguineous Exudate Color: red, brown Foul Odor After Cleansing: No Slough/Fibrino Yes Wound Bed Granulation Amount: Large (67-100%) Exposed Structure Granulation Quality: Red Fascia Exposed: No Necrotic Amount: Small (1-33%) Fat Layer (Subcutaneous Tissue) Exposed: Yes Necrotic Quality:  Adherent Slough Tendon Exposed: No Muscle Exposed: No Joint Exposed: No Bone Exposed: No Periwound Skin Texture Texture Color No Abnormalities Noted: Yes No Abnormalities Noted: Yes Moisture Temperature / Pain No Abnormalities Noted: Yes Temperature: No Abnormality Treatment Notes Wound #1 (Lower Leg) Wound Laterality: Right, Anterior Cleanser Soap and Water Discharge Instruction: May shower and wash wound with dial antibacterial soap and water prior to dressing change. Vashe 5.8 (oz) Discharge Instruction: Cleanse the wound with Vashe prior to applying a clean dressing using gauze sponges, not tissue or cotton balls. Peri-Wound Care Triamcinolone 15 (g) Discharge Instruction: Use triamcinolone 15 (g) as directed Sween Lotion (Moisturizing lotion) Discharge Instruction: Apply moisturizing lotion as directed Topical Primary Dressing Hydrofera Blue Classic Foam, 4x4 in Discharge Instruction: Moisten with saline prior to applying to wound bed Secondary Dressing Woven Gauze Sponge, Non-Sterile 4x4 in Discharge Instruction: Apply over primary dressing as directed. Secured With Compression Wrap Urgo K2 Lite, (equivalent to a 3 layer) two layer compression system, regular Discharge Instruction: Apply Urgo K2 Lite as directed (alternative to 3 layer compression). Compression Stockings EXCELL, BOSTOCK ([id number]) [id number] Page 4 of 4 Add-Ons Electronic Signature(s) Signed: 07/28/2022 4:29:08 PM By: Zenaida Deed RN, BSN Entered By: Zenaida Deed on 07/28/2022 15:15:57

## 2022-08-04 ENCOUNTER — Encounter (HOSPITAL_BASED_OUTPATIENT_CLINIC_OR_DEPARTMENT_OTHER): Payer: PPO | Admitting: General Surgery

## 2022-08-04 DIAGNOSIS — J449 Chronic obstructive pulmonary disease, unspecified: Secondary | ICD-10-CM | POA: Diagnosis not present

## 2022-08-04 DIAGNOSIS — I10 Essential (primary) hypertension: Secondary | ICD-10-CM | POA: Diagnosis not present

## 2022-08-04 DIAGNOSIS — L97812 Non-pressure chronic ulcer of other part 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: Secondary | ICD-10-CM | POA: Diagnosis not present

## 2022-08-04 NOTE — Progress Notes (Signed)
Mitchell, Parrish ([id number]) [id number] Page 1 of 6 Visit Report for 08/04/2022 Chief Complaint Document Details Patient Name: Date of Service: Mitchell Parrish, Mitchell Parrish 08/04/2022 2:30 PM Medical Record Number: [id number] Patient Account Number: [id number] Date of Birth/Sex: Treating RN: 1955-10-16 (67 y.o. M) Primary Care Provider: Arva Parrish Other Clinician: Referring Provider: Treating Provider/Extender: Mitchell Parrish in Treatment: 9 Information Obtained from: Patient Chief Complaint Patient seen for complaints of Non-Healing Wound. Electronic Signature(s) Signed: 08/04/2022 3:11:36 PM By: Duanne Guess MD FACS Entered By: Duanne Guess on 08/04/2022 15:11:36 -------------------------------------------------------------------------------- HPI Details Patient Name: Date of Service: Mitchell Parrish, Mitchell Mitchell K. 08/04/2022 2:30 PM Medical Record Number: [id number] Patient Account Number: [id number] Date of Birth/Sex: Treating RN: 1955/08/07 (67 y.o. M) Primary Care Provider: Arva Parrish Other Clinician: Referring Provider: Treating Provider/Extender: Mitchell Parrish in Treatment: 9 History of Present Illness HPI Description: ADMISSION 06/02/2022 This is a 67 year old none diabetic with a fairly extensive cardiac history who was mowing his lawn on May 02, 2021 when he went into a branch that is partially broken off telemetry. It lacerated his leg and he presented to the emergency department for further evaluation and management. The wound was sutured and he was given a course of Keflex. He returned to the ED a little more than a week later with increased swelling and erythema. He was given cefdinir and doxycycline. He has since been treating it by applying Xeroform to the site. He does smoke, admitting to half a pack per day. He is not diabetic. ABI in clinic today 0.98. The sutures have been  removed. There is a longitudinal wound on his anterior tibial surface. There is slough and nonviable skin on the surface. It appears to have filled in considerably since the time of the initial injury when comparing to the photographs in the electronic medical record. There is no periwound erythema. He does have 1+ pitting edema. 06/09/2022: The Iodoflex did a very good job of softening up the eschar and slough. There is more granulation tissue emerging underneath this. His edema control is much improved. 06/16/2022: He still has a fair amount of slough, but the wound is getting smaller and there is more granulation tissue present. Edema control is good. 06/23/2022: The wound is shorter in length by about half a centimeter. There is good granulation tissue on the surface and substantially less slough. There is still some depth to the wound and some nonviable fat at the distal end. 06/30/2022: The wound is shorter by another centimeter this week. There is very little slough. There is still some depth at the distal end but the rest has filled with good granulation tissue and is flush with the surrounding skin. 07/07/2022: The wound was shorter by about half a centimeter this week. Minimal slough accumulation. The distal end depth is filling in and the entire wound is nearly flush with the surrounding skin now. 07/14/2022: The wound continues to contract. There is more and more epithelium emerging. Very minimal slough present. 07/21/2022: The epithelium has expanded to the point that it has divided the wound into 2 separate sections. The surface is fairly clean with just a small amount of slough present and some thin dry eschar around the edges. 08/04/2022: His wound is completely healed. Mitchell Parrish, Mitchell Parrish ([id number]) [id number] Page 2 of 6 Electronic Signature(s) Signed: 08/04/2022 3:11:58 PM By: Duanne Guess MD FACS Entered By: Duanne Guess on 08/04/2022  15:11:58 -------------------------------------------------------------------------------- Physical Exam Details Patient Name: Date of Service: Mitchell Parrish, Mitchell  Mitchell K. 08/04/2022 2:30 PM Medical Record Number: [id number] Patient Account Number: [id number] Date of Birth/Sex: Treating RN: 03-27-1955 (67 y.o. M) Primary Care Provider: Arva Parrish Other Clinician: Referring Provider: Treating Provider/Extender: Mitchell Parrish in Treatment: 9 Constitutional Hypertensive, asymptomatic. . . . no acute distress. Respiratory Normal work of breathing on room air. Notes 08/04/2022: The wound is completely epithelialized and healed. Electronic Signature(s) Signed: 08/04/2022 3:12:36 PM By: Duanne Guess MD FACS Entered By: Duanne Guess on 08/04/2022 15:12:36 -------------------------------------------------------------------------------- Physician Orders Details Patient Name: Date of Service: Mitchell Reno. 08/04/2022 2:30 PM Medical Record Number: [id number] Patient Account Number: [id number] Date of Birth/Sex: Treating RN: [birth year] (67 y.o. Mitchell Parrish Primary Care Provider: Arva Parrish Other Clinician: Referring Provider: Treating Provider/Extender: Mitchell Parrish in Treatment: 9 Verbal / Phone Orders: No Diagnosis Coding ICD-10 Coding Code Description [phone] Non-pressure chronic ulcer of other part 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 I50.32 Chronic diastolic (congestive) heart failure J44.9 Chronic obstructive pulmonary disease, unspecified I10 Essential (primary) hypertension Z72.0 Tobacco use Discharge From [facility] Discharge from Wound Care Center - Please call us if you need [place]. Congratulation on healing!! Bathing/ Shower/ Hygiene May shower and wash wound with soap and water. Electronic Signature(s) Signed: 08/04/2022 3:12:53 PM By: Duanne Guess MD FACS Mitchell Parrish,Signed: 08/04/2022  3:12:53 PM By: Duanne Guess MD Hulda Marin ([id number]) [id number] Page 3 of 6 Entered By: Duanne Guess on 08/04/2022 15:12:53 -------------------------------------------------------------------------------- Problem List Details Patient Name: Date of Service: Mitchell Parrish, Mitchell Parrish 08/04/2022 2:30 PM Medical Record Number: [id number] Patient Account Number: [id number] Date of Birth/Sex: Treating RN: October 31, 1955 (67 y.o. M) Primary Care Provider: Arva Parrish Other Clinician: Referring Provider: Treating Provider/Extender: Mitchell Parrish in Treatment: 9 Active Problems ICD-10 Encounter Code Description Active Date MDM Diagnosis L97.812 Non-pressure chronic ulcer of other part of right lower leg with fat layer 06/02/2022 No Yes exposed I50.32 Chronic diastolic (congestive) heart failure 06/02/2022 No Yes J44.9 Chronic obstructive pulmonary disease, unspecified 06/02/2022 No Yes I10 Essential (primary) hypertension 06/02/2022 No Yes Z72.0 Tobacco use 06/02/2022 No Yes Inactive Problems Resolved Problems Electronic Signature(s) Signed: 08/04/2022 3:11:21 PM By: Duanne Guess MD FACS Previous Signature: 08/04/2022 3:09:57 PM Version By: Duanne Guess MD FACS Entered By: Duanne Guess on 08/04/2022 15:11:21 -------------------------------------------------------------------------------- Progress Note Details Patient Name: Date of Service: Mitchell Parrish K. 08/04/2022 2:30 PM Medical Record Number: [id number] Patient Account Number: [id number] Date of Birth/Sex: Treating RN: 29-Jan-1956 (67 y.o. M) Primary Care Provider: Arva Parrish Other Clinician: Referring Provider: Treating Provider/Extender: Mitchell Parrish in Treatment: [address] [place] Salmon ([id number]) [id number] Page 4 of 6 Chief Complaint Information obtained from Patient Patient  seen for complaints of Non-Healing Wound. History of Present Illness (HPI) ADMISSION 06/02/2022 This is a 67 year old none diabetic with a fairly extensive cardiac history who was mowing his lawn on May 02, 2021 when he went into a branch that is partially broken off telemetry. It lacerated his leg and he presented to the emergency department for further evaluation and management. The wound was sutured and he was given a course of Keflex. He returned to the ED a little more than a week later with increased swelling and erythema. He was given cefdinir and doxycycline. He has since been treating it by applying Xeroform to the site. He does smoke, admitting to half a pack per day. He is not diabetic. ABI in clinic today 0.98. The sutures have been removed. There  is a longitudinal wound on his anterior tibial surface. There is slough and nonviable skin on the surface. It appears to have filled in considerably since the time of the initial injury when comparing to the photographs in the electronic medical record. There is no periwound erythema. He does have 1+ pitting edema. 06/09/2022: The Iodoflex did a very good job of softening up the eschar and slough. There is more granulation tissue emerging underneath this. His edema control is much improved. 06/16/2022: He still has a fair amount of slough, but the wound is getting smaller and there is more granulation tissue present. Edema control is good. 06/23/2022: The wound is shorter in length by about half a centimeter. There is good granulation tissue on the surface and substantially less slough. There is still some depth to the wound and some nonviable fat at the distal end. 06/30/2022: The wound is shorter by another centimeter this week. There is very little slough. There is still some depth at the distal end but the rest has filled with good granulation tissue and is flush with the surrounding skin. 07/07/2022: The wound was shorter by about half a  centimeter this week. Minimal slough accumulation. The distal end depth is filling in and the entire wound is nearly flush with the surrounding skin now. 07/14/2022: The wound continues to contract. There is more and more epithelium emerging. Very minimal slough present. 07/21/2022: The epithelium has expanded to the point that it has divided the wound into 2 separate sections. The surface is fairly clean with just a small amount of slough present and some thin dry eschar around the edges. 08/04/2022: His wound is completely healed. Patient History Information obtained from Patient. Family History Cancer - Mother,Father, Diabetes - Father, Heart Disease - Father, Hypertension - Father, Lung Disease - Mother, Stroke - Father, Tuberculosis - Paternal Grandparents, No family history of Kidney Disease, Seizures, Thyroid Problems. Social History Current every day smoker - 1/2 pack day 20+ years, Marital Status - Married, Alcohol Use - Moderate, Drug Use - No History, Caffeine Use - Daily. Medical History Respiratory Patient has history of Chronic Obstructive Pulmonary Disease (COPD), Sleep Apnea Cardiovascular Patient has history of Arrhythmia, Congestive Heart Failure, Hypertension Musculoskeletal Patient has history of Osteoarthritis Medical A Surgical History Notes nd Ear/Nose/Mouth/Throat seasonal allergies Objective Constitutional Hypertensive, asymptomatic. no acute distress. Vitals Time Taken: 2:27 PM, Height: 71 in, Weight: 247 lbs, BMI: 34.4, Temperature: 97.8 F, Pulse: 89 bpm, Respiratory Rate: 20 breaths/min, Blood Pressure: 190/96 mmHg. Respiratory Normal work of breathing on room air. General Notes: 08/04/2022: The wound is completely epithelialized and healed. Integumentary (Hair, Skin) Wound #1 status is Healed - Epithelialized. Original cause of wound was Trauma. The date acquired was: 05/03/2022. The wound has been in treatment 9 weeks. The wound is located on the  Right,Anterior Lower Leg. The wound measures 0cm length x 0cm width x 0cm depth; 0cm^2 area and 0cm^3 volume. There is Fat Layer (Subcutaneous Tissue) exposed. There is no tunneling or undermining noted. There is a small amount of serosanguineous drainage noted. The wound Mitchell Parrish, Mitchell Parrish ([id number]) [id number] Page 5 of 6 margin is flat and intact. There is large (67-100%) granulation within the wound bed. There is a small (1-33%) amount of necrotic tissue within the wound bed. The periwound skin appearance had no abnormalities noted for texture. The periwound skin appearance had no abnormalities noted for moisture. The periwound skin appearance had no abnormalities noted for color. Periwound temperature was noted as No Abnormality. Assessment  Active Problems ICD-10 Non-pressure chronic ulcer of other part 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 Chronic diastolic (congestive) heart failure Chronic obstructive pulmonary disease, unspecified Essential (primary) hypertension Tobacco use Plan Discharge From [facility] Services: Discharge from Wound Care Center - Please call us if you need [place]. Congratulation on healing!! Bathing/ Shower/ Hygiene: May shower and wash wound with soap and water. 08/04/2022: The wound is completely epithelialized and healed. He was cautioned to protect the area from trauma for the next month or so as the freshly healed skin is still a bit delicate, as well as to protect it from UV radiation as it will be more sun sensitive. We will discharge him from the wound care center. He may follow-up as needed. Electronic Signature(s) Signed: 08/04/2022 3:13:42 PM By: Duanne Guess MD FACS Entered By: Duanne Guess on 08/04/2022 15:13:42 -------------------------------------------------------------------------------- HxROS Details Patient Name: Date of Service: Mitchell Parrish, Mitchell Mitchell K. 08/04/2022 2:30 PM Medical Record Number: [id number] Patient  Account Number: [id number] Date of Birth/Sex: Treating RN: [birth year] (67 y.o. M) Primary Care Provider: Arva Parrish Other Clinician: Referring Provider: Treating Provider/Extender: Mitchell Parrish in Treatment: 9 Information Obtained From Patient Ear/Nose/Mouth/Throat Medical History: Past Medical History Notes: seasonal allergies Respiratory Medical History: Positive for: Chronic Obstructive Pulmonary Disease (COPD); Sleep Apnea Cardiovascular Medical History: Positive for: Arrhythmia; Congestive Heart Failure; Hypertension Mitchell Parrish, Mitchell Parrish ([id number]) [id number] Page 6 of 6 Musculoskeletal Medical History: Positive for: Osteoarthritis Immunizations Pneumococcal Vaccine: Received Pneumococcal Vaccination: Yes Received Pneumococcal Vaccination On or After 60th Birthday: Yes Implantable Devices None Family and Social History Cancer: Yes - Mother,Father; Diabetes: Yes - Father; Heart Disease: Yes - Father; Hypertension: Yes - Father; Kidney Disease: No; Lung Disease: Yes - Mother; Seizures: No; Stroke: Yes - Father; Thyroid Problems: No; Tuberculosis: Yes - Paternal Grandparents; Current every day smoker - 1/2 pack day 20+ years; Marital Status - Married; Alcohol Use: Moderate; Drug Use: No History; Caffeine Use: Daily; Financial Concerns: No; Food, Clothing or Shelter Needs: No; Support System Lacking: No; Transportation Concerns: No Electronic Signature(s) Signed: 08/04/2022 3:23:03 PM By: Duanne Guess MD FACS Entered By: Duanne Guess on 08/04/2022 15:12:06 -------------------------------------------------------------------------------- SuperBill Details Patient Name: Date of Service: Mitchell Reno. 08/04/2022 Medical Record Number: [id number] Patient Account Number: [id number] Date of Birth/Sex: Treating RN: 08/15/1955 (67 y.o. Mitchell Parrish Primary Care Provider: Arva Parrish Other  Clinician: Referring Provider: Treating Provider/Extender: Mitchell Parrish in Treatment: 9 Diagnosis Coding ICD-10 Codes Code Description [phone] Non-pressure chronic ulcer of other part 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 I50.32 Chronic diastolic (congestive) heart failure J44.9 Chronic obstructive pulmonary disease, unspecified I10 Essential (primary) hypertension Z72.0 Tobacco use Facility Procedures : CPT4 Code: [id number] Description: [phone] - WOUND CARE VISIT-LEV 2 EST PT Modifier: Quantity: 1 Physician Procedures : CPT4 Code Description Modifier [id number] - WC PHYS LEVEL 3 - EST PT ICD-10 Diagnosis Description L97.812 Non-pressure chronic ulcer of other part 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 I50.32 Chronic diastolic (congestive) heart failure J44.9  Chronic obstructive pulmonary disease, unspecified Z72.0 Tobacco use Quantity: 1 Electronic Signature(s) Signed: 08/04/2022 3:14:15 PM By: Duanne Guess MD FACS Entered By: Duanne Guess on 08/04/2022 15:14:15

## 2022-08-08 NOTE — Progress Notes (Signed)
Mitchell Parrish ([id number]) [id number] Page 1 of 7 Visit Report for 08/04/2022 Arrival Information Details Patient Name: Date of Service: Mitchell Parrish, Mitchell Parrish 08/04/2022 2:30 PM Medical Record Number: [id number] Patient Account Number: [id number] Date of Birth/Sex: Treating RN: August 01, 1955 (67 y.o. Mitchell Parrish Primary Care Christy Ehrsam: Arva Chafe Other Clinician: Referring Lachanda Buczek: Treating Nia Nathaniel/Extender: Vivien Rossetti in Treatment: 9 Visit Information History Since Last Visit All ordered tests and consults were completed: Yes Patient Arrived: Ambulatory Added or deleted any medications: No Arrival Time: 14:26 Any new allergies or adverse reactions: No Accompanied By: self Had a fall or experienced change in No Transfer Assistance: None activities of daily living that may affect Patient Requires Transmission-Based Precautions: No risk of falls: Patient Has Alerts: No Signs or symptoms of abuse/neglect since last visito No Hospitalized since last visit: No Implantable device outside of the clinic excluding No cellular tissue based products placed in the center since last visit: Has Dressing in Place as Prescribed: Yes Pain Present Now: No Electronic Signature(s) Signed: 08/08/2022 2:57:52 PM By: Brenton Grills Entered By: Brenton Grills on 08/04/2022 14:26:45 -------------------------------------------------------------------------------- Clinic Level of Care Assessment Details Patient Name: Date of Service: Mitchell Parrish 08/04/2022 2:30 PM Medical Record Number: [id number] Patient Account Number: [id number] Date of Birth/Sex: Treating RN: [birth year] (67 y.o. Mitchell Parrish Primary Care Bell Carbo: Arva Chafe Other Clinician: Referring Kani Chauvin: Treating Leightyn Cina/Extender: Vivien Rossetti in Treatment: 9 Clinic Level of Care Assessment Items TOOL 4 Quantity  Score X- 1 0 Use when only an EandM is performed on FOLLOW-UP visit ASSESSMENTS - Nursing Assessment / Reassessment X- 1 10 Reassessment of Co-morbidities (includes updates in patient status) X- 1 5 Reassessment of Adherence to Treatment Plan ASSESSMENTS - Wound and Skin A ssessment / Reassessment X - Simple Wound Assessment / Reassessment - one wound 1 5 []  - 0 Complex Wound Assessment / Reassessment - multiple wounds []  - 0 Dermatologic / Skin Assessment (not related to wound area) ASSESSMENTS - Focused Assessment X- 1 5 Circumferential Edema Measurements - multi extremities []  - 0 Nutritional Assessment / Counseling / Intervention Mitchell Parrish, Mitchell Parrish ([id number]) [id number] Page 2 of 7 []  - 0 Lower Extremity Assessment (monofilament, tuning fork, pulses) []  - 0 Peripheral Arterial Disease Assessment (using hand held doppler) ASSESSMENTS - Ostomy and/or Continence Assessment and Care []  - 0 Incontinence Assessment and Management []  - 0 Ostomy Care Assessment and Management (repouching, etc.) PROCESS - Coordination of Care X - Simple Patient / Family Education for ongoing care 1 15 []  - 0 Complex (extensive) Patient / Family Education for ongoing care X- 1 10 Staff obtains Chiropractor, Records, T Results / Process Orders est []  - 0 Staff telephones HHA, Nursing Homes / Clarify orders / etc []  - 0 Routine Transfer to another Facility (non-emergent condition) []  - 0 Routine Hospital Admission (non-emergent condition) []  - 0 New Admissions / Manufacturing engineer / Ordering NPWT Apligraf, etc. , []  - 0 Emergency Hospital Admission (emergent condition) X- 1 10 Simple Discharge Coordination []  - 0 Complex (extensive) Discharge Coordination PROCESS - Special Needs []  - 0 Pediatric / Minor Patient Management []  - 0 Isolation Patient Management []  - 0 Hearing / Language / Visual special needs []  - 0 Assessment of Community assistance  (transportation, D/C planning, etc.) []  - 0 Additional assistance / Altered mentation []  - 0 Support Surface(s) Assessment (bed, cushion, seat, etc.) INTERVENTIONS - Wound Cleansing / Measurement X - Simple Wound Cleansing - one wound  1 5 []  - 0 Complex Wound Cleansing - multiple wounds X- 1 5 Wound Imaging (photographs - any number of wounds) []  - 0 Wound Tracing (instead of photographs) []  - 0 Simple Wound Measurement - one wound []  - 0 Complex Wound Measurement - multiple wounds INTERVENTIONS - Wound Dressings []  - 0 Small Wound Dressing one or multiple wounds []  - 0 Medium Wound Dressing one or multiple wounds []  - 0 Large Wound Dressing one or multiple wounds []  - 0 Application of Medications - topical []  - 0 Application of Medications - injection INTERVENTIONS - Miscellaneous []  - 0 External ear exam []  - 0 Specimen Collection (cultures, biopsies, blood, body fluids, etc.) []  - 0 Specimen(s) / Culture(s) sent or taken to Lab for analysis []  - 0 Patient Transfer (multiple staff / Nurse, adult / Similar devices) []  - 0 Simple Staple / Suture removal (25 or less) []  - 0 Complex Staple / Suture removal (26 or more) []  - 0 Hypo / Hyperglycemic Management (close monitor of Blood Glucose) Mitchell Parrish, Mitchell Parrish ([id number]) [id number] Page 3 of 7 []  - 0 Ankle / Brachial Index (ABI) - do not check if billed separately X- 1 5 Vital Signs Has the patient been seen at the hospital within the last three years: Yes Total Score: 75 Level Of Care: New/Established - Level 2 Electronic Signature(s) Signed: 08/08/2022 2:57:52 PM By: Brenton Grills Entered By: Brenton Grills on 08/04/2022 15:19:39 -------------------------------------------------------------------------------- Lower Extremity Assessment Details Patient Name: Date of Service: Mitchell Parrish, Mitchell Parrish 08/04/2022 2:30 PM Medical Record Number: [id number] Patient Account Number: [id number] Date  of Birth/Sex: Treating RN: 1955-06-03 (67 y.o. Mitchell Parrish Primary Care Elorah Dewing: Arva Chafe Other Clinician: Referring Michoel Kunin: Treating Triva Hueber/Extender: Vivien Rossetti in Treatment: 9 Edema Assessment Assessed: Kyra Searles: No] Franne Forts: No] Edema: [Left: Ye] [Right: s] Calf Left: Right: Point of Measurement: 30 cm From Medial Instep 36.4 cm Ankle Left: Right: Point of Measurement: 11 cm From Medial Instep 27 cm Vascular Assessment Pulses: Dorsalis Pedis Palpable: [Right:Yes] Electronic Signature(s) Signed: 08/08/2022 2:57:52 PM By: Brenton Grills Entered By: Brenton Grills on 08/04/2022 14:28:06 -------------------------------------------------------------------------------- Multi Wound Chart Details Patient Name: Date of Service: Mitchell Parrish. 08/04/2022 2:30 PM Medical Record Number: [id number] Patient Account Number: [id number] Date of Birth/Sex: Treating RN: [birth year] (67 y.o. M) Primary Care Donnette Macmullen: Arva Chafe Other Clinician: Referring Dajanee Voorheis: Treating Dennie Vecchio/Extender: Vivien Rossetti in Treatment: 9 Vital Signs Height(in): 71 Pulse(bpm): 89 Weight(lbs): 247 Blood Pressure(mmHg): 190/96 Body Mass Index(BMI): 34.4 [place], [place] Parrish ([id number]) [id number] Page 4 of 7 Temperature(F): 97.8 Respiratory Rate(breaths/min): 20 [1:Photos:] [N/A:N/A] Right, Anterior Lower Leg N/A N/A Wound Location: Trauma N/A N/A Wounding Event: Abrasion N/A N/A Primary Etiology: Chronic Obstructive Pulmonary N/A N/A Comorbid History: Disease (COPD), Sleep Apnea, Arrhythmia, Congestive Heart Failure, Hypertension, Osteoarthritis 05/03/2022 N/A N/A Date Acquired: 9 N/A N/A Weeks of Treatment: Healed - Epithelialized N/A N/A Wound Status: No N/A N/A Wound Recurrence: 0x0x0 N/A N/A Measurements L x W x D (cm) 0 N/A N/A A (cm) : rea 0 N/A N/A Volume (cm) : 100.00%  N/A N/A % Reduction in Area: 100.00% N/A N/A % Reduction in Volume: Full Thickness Without Exposed N/A N/A Classification: Support Structures Small N/A N/A Exudate Amount: Serosanguineous N/A N/A Exudate Type: red, brown N/A N/A Exudate Color: Flat and Intact N/A N/A Wound Margin: Large (67-100%) N/A N/A Granulation Amount: Small (1-33%) N/A N/A Necrotic Amount: Fat Layer (Subcutaneous Tissue): Yes N/A N/A Exposed Structures: Fascia: No Tendon:  No Muscle: No Joint: No Bone: No Large (67-100%) N/A N/A Epithelialization: No Abnormalities Noted N/A N/A Periwound Skin Texture: No Abnormalities Noted N/A N/A Periwound Skin Moisture: No Abnormalities Noted N/A N/A Periwound Skin Color: No Abnormality N/A N/A Temperature: Treatment Notes Electronic Signature(s) Signed: 08/04/2022 3:11:31 PM By: Duanne Guess MD FACS Previous Signature: 08/04/2022 3:10:07 PM Version By: Duanne Guess MD FACS Entered By: Duanne Guess on 08/04/2022 15:11:31 -------------------------------------------------------------------------------- Multi-Disciplinary Care Plan Details Patient Name: Date of Service: Mitchell Parrish. 08/04/2022 2:30 PM Medical Record Number: [id number] Patient Account Number: [id number] Date of Birth/Sex: Treating RN: 09-20-1955 (67 y.o. Mitchell Parrish Primary Care Madix Blowe: Arva Chafe Other Clinician: Referring Markasia Carrol: Treating Desarae Placide/Extender: Vivien Rossetti in Treatment: 9 Multidisciplinary Care Plan reviewed with physician [address] Mitchell Parrish, Mitchell Parrish ([id number]) [id number] Page 5 of 7 Electronic Signature(s) Signed: 08/08/2022 2:57:52 PM By: Brenton Grills Entered By: Brenton Grills on 08/04/2022 15:14:14 -------------------------------------------------------------------------------- Pain Assessment Details Patient Name: Date of Service: Mitchell Parrish, Mitchell Parrish 08/04/2022 2:30  PM Medical Record Number: [id number] Patient Account Number: [id number] Date of Birth/Sex: Treating RN: 06-21-1955 (67 y.o. Mitchell Parrish Primary Care Krisann Mckenna: Arva Chafe Other Clinician: Referring Baden Betsch: Treating Jakarius Flamenco/Extender: Vivien Rossetti in Treatment: 9 Active Problems Location of Pain Severity and Description of Pain Patient Has Paino No Site Locations Pain Management and Medication Current Pain Management: Electronic Signature(s) Signed: 08/08/2022 2:57:52 PM By: Brenton Grills Entered By: Brenton Grills on 08/04/2022 14:27:38 -------------------------------------------------------------------------------- Patient/Caregiver Education Details Patient Name: Date of Service: Mitchell Parrish 6/27/2024andnbsp2:30 PM Medical Record Number: [id number] Patient Account Number: [id number] Date of Birth/Gender: Treating RN: [birth year] (67 y.o. Mitchell Parrish Primary Care Physician: Arva Chafe Other Clinician: Referring Physician: Treating Physician/Extender: Vivien Rossetti in Treatment: [address] [place] Parrish ([id number]) [id number] Page 6 of 7 Education Provided To: Patient Education Topics Provided Wound/Skin Impairment: Methods: Explain/Verbal Responses: State content correctly Electronic Signature(s) Signed: 08/08/2022 2:57:52 PM By: Brenton Grills Entered By: Brenton Grills on 08/04/2022 14:36:03 -------------------------------------------------------------------------------- Wound Assessment Details Patient Name: Date of Service: Mitchell Parrish. 08/04/2022 2:30 PM Medical Record Number: [id number] Patient Account Number: [id number] Date of Birth/Sex: Treating RN: [birth year] (67 y.o. Mitchell Parrish Primary Care Nakaila Freeze: Arva Chafe Other Clinician: Referring Deeandra Jerry: Treating Reathel Turi/Extender: Vivien Rossetti in Treatment: 9 Wound Status Wound Number: 1 Primary Abrasion Etiology: Wound Location: Right, Anterior Lower Leg Wound Healed - Epithelialized Wounding Event: Trauma Status: Date Acquired: 05/03/2022 Comorbid Chronic Obstructive Pulmonary Disease (COPD), Sleep Apnea, Weeks Of Treatment: 9 History: Arrhythmia, Congestive Heart Failure, Hypertension, Osteoarthritis Clustered Wound: No Photos Wound Measurements Length: (cm) Width: (cm) Depth: (cm) Area: (cm) Volume: (cm) 0 % Reduction in Area: 100% 0 % Reduction in Volume: 100% 0 Epithelialization: Large (67-100%) 0 Tunneling: No 0 Undermining: No Wound Description Classification: Full Thickness Without Exposed Support Structures Wound Margin: Flat and Intact Exudate Amount: Small Exudate Type: Serosanguineous Exudate Color: red, brown Foul Odor After Cleansing: No Slough/Fibrino Yes Wound Bed Granulation Amount: Large (67-100%) Exposed Structure Necrotic Amount: Small (1-33%) Fascia Exposed: No Fat Layer (Subcutaneous Tissue) Exposed: Yes Tendon Exposed: No Muscle Exposed: No Joint Exposed: No Mitchell Parrish, Mitchell Parrish ([id number]) [id number] Page 7 of 7 Bone Exposed: No Periwound Skin Texture Texture Color No Abnormalities Noted: Yes No Abnormalities Noted: Yes Moisture Temperature / Pain No Abnormalities Noted: Yes Temperature: No Abnormality Electronic Signature(s) Signed: 08/04/2022 3:11:09 PM By: Duanne Guess MD FACS Signed: 08/08/2022 2:57:52 PM By: Pincus Large,  Criss Alvine By: Duanne Guess on 08/04/2022 15:11:08 -------------------------------------------------------------------------------- Vitals Details Patient Name: Date of Service: Mitchell Parrish, Mitchell Parrish 08/04/2022 2:30 PM Medical Record Number: [id number] Patient Account Number: [id number] Date of Birth/Sex: Treating RN: 14-Jan-1956 (67 y.o. Mitchell Parrish Primary Care Tesla Bochicchio: Arva Chafe Other  Clinician: Referring Reily Treloar: Treating Kadi Hession/Extender: Vivien Rossetti in Treatment: 9 Vital Signs Time Taken: 14:27 Temperature (F): 97.8 Height (in): 71 Pulse (bpm): 89 Weight (lbs): 247 Respiratory Rate (breaths/min): 20 Body Mass Index (BMI): 34.4 Blood Pressure (mmHg): 190/96 Reference Range: 80 - 120 mg / dl Electronic Signature(s) Signed: 08/08/2022 2:57:52 PM By: Brenton Grills Entered By: Brenton Grills on 08/04/2022 14:27:30

## 2022-08-12 ENCOUNTER — Ambulatory Visit (HOSPITAL_BASED_OUTPATIENT_CLINIC_OR_DEPARTMENT_OTHER): Payer: PPO | Admitting: General Surgery

## 2022-08-12 DIAGNOSIS — H353210 Exudative age-related macular degeneration, right eye, stage unspecified: Secondary | ICD-10-CM | POA: Diagnosis not present

## 2022-08-12 DIAGNOSIS — H353122 Nonexudative age-related macular degeneration, left eye, intermediate dry stage: Secondary | ICD-10-CM | POA: Diagnosis not present

## 2022-08-18 ENCOUNTER — Ambulatory Visit (HOSPITAL_BASED_OUTPATIENT_CLINIC_OR_DEPARTMENT_OTHER): Payer: PPO | Admitting: General Surgery

## 2022-08-18 DIAGNOSIS — H35373 Puckering of macula, bilateral: Secondary | ICD-10-CM | POA: Diagnosis not present

## 2022-08-18 DIAGNOSIS — H353133 Nonexudative age-related macular degeneration, bilateral, advanced atrophic without subfoveal involvement: Secondary | ICD-10-CM | POA: Diagnosis not present

## 2022-08-18 DIAGNOSIS — H353211 Exudative age-related macular degeneration, right eye, with active choroidal neovascularization: Secondary | ICD-10-CM | POA: Diagnosis not present

## 2022-08-18 DIAGNOSIS — H43811 Vitreous degeneration, right eye: Secondary | ICD-10-CM | POA: Diagnosis not present

## 2022-08-24 ENCOUNTER — Telehealth: Payer: Self-pay | Admitting: Family Medicine

## 2022-08-24 NOTE — Telephone Encounter (Signed)
Pt called stating that [employer] had stated that they were not going to cover his AWV phone call for DOS: 6.19.24. Pt stated the bill looks like it is going to be ~$400 for the visit and is wondering why it isn't covered. Pt had no overlap in appts. Please Advise.

## 2022-08-25 ENCOUNTER — Ambulatory Visit (HOSPITAL_BASED_OUTPATIENT_CLINIC_OR_DEPARTMENT_OTHER): Payer: PPO | Admitting: General Surgery

## 2022-08-31 ENCOUNTER — Telehealth: Payer: Self-pay

## 2022-08-31 NOTE — Telephone Encounter (Signed)
Reached out to patient to set up follow up visit or physical with provider to discuss chronic conditions.  Telephone encounter attempt : 1   A HIPAA compliant voice message was left requesting a return call.  Instructed patient to call office or to call me at [phone].  Elijio Miles [facility]

## 2022-09-01 ENCOUNTER — Ambulatory Visit (HOSPITAL_BASED_OUTPATIENT_CLINIC_OR_DEPARTMENT_OTHER): Payer: PPO | Admitting: General Surgery

## 2022-09-15 DIAGNOSIS — H35373 Puckering of macula, bilateral: Secondary | ICD-10-CM | POA: Diagnosis not present

## 2022-09-15 DIAGNOSIS — H43811 Vitreous degeneration, right eye: Secondary | ICD-10-CM | POA: Diagnosis not present

## 2022-09-15 DIAGNOSIS — H353133 Nonexudative age-related macular degeneration, bilateral, advanced atrophic without subfoveal involvement: Secondary | ICD-10-CM | POA: Diagnosis not present

## 2022-09-15 DIAGNOSIS — H2513 Age-related nuclear cataract, bilateral: Secondary | ICD-10-CM | POA: Diagnosis not present

## 2022-09-15 DIAGNOSIS — H353211 Exudative age-related macular degeneration, right eye, with active choroidal neovascularization: Secondary | ICD-10-CM | POA: Diagnosis not present

## 2022-09-22 ENCOUNTER — Encounter (INDEPENDENT_AMBULATORY_CARE_PROVIDER_SITE_OTHER): Payer: Self-pay

## 2022-10-07 ENCOUNTER — Other Ambulatory Visit: Payer: Self-pay | Admitting: Cardiology

## 2022-10-13 DIAGNOSIS — H353133 Nonexudative age-related macular degeneration, bilateral, advanced atrophic without subfoveal involvement: Secondary | ICD-10-CM | POA: Diagnosis not present

## 2022-10-13 DIAGNOSIS — H43811 Vitreous degeneration, right eye: Secondary | ICD-10-CM | POA: Diagnosis not present

## 2022-10-13 DIAGNOSIS — H2513 Age-related nuclear cataract, bilateral: Secondary | ICD-10-CM | POA: Diagnosis not present

## 2022-10-13 DIAGNOSIS — H35373 Puckering of macula, bilateral: Secondary | ICD-10-CM | POA: Diagnosis not present

## 2022-10-13 DIAGNOSIS — H353211 Exudative age-related macular degeneration, right eye, with active choroidal neovascularization: Secondary | ICD-10-CM | POA: Diagnosis not present

## 2022-10-28 ENCOUNTER — Encounter: Payer: Self-pay | Admitting: Family Medicine

## 2022-10-28 ENCOUNTER — Ambulatory Visit (INDEPENDENT_AMBULATORY_CARE_PROVIDER_SITE_OTHER): Payer: PPO | Admitting: Family Medicine

## 2022-10-28 VITALS — BP 130/78 | HR 72 | Temp 98.1°F | Ht 71.0 in | Wt 249.5 lb

## 2022-10-28 DIAGNOSIS — Z122 Encounter for screening for malignant neoplasm of respiratory organs: Secondary | ICD-10-CM | POA: Diagnosis not present

## 2022-10-28 DIAGNOSIS — I10 Essential (primary) hypertension: Secondary | ICD-10-CM

## 2022-10-28 DIAGNOSIS — Z0000 Encounter for general adult medical examination without abnormal findings: Secondary | ICD-10-CM | POA: Diagnosis not present

## 2022-10-28 DIAGNOSIS — Z23 Encounter for immunization: Secondary | ICD-10-CM

## 2022-10-28 DIAGNOSIS — Z1211 Encounter for screening for malignant neoplasm of colon: Secondary | ICD-10-CM

## 2022-10-28 DIAGNOSIS — J42 Unspecified chronic bronchitis: Secondary | ICD-10-CM

## 2022-10-28 DIAGNOSIS — Z125 Encounter for screening for malignant neoplasm of prostate: Secondary | ICD-10-CM | POA: Diagnosis not present

## 2022-10-28 NOTE — Addendum Note (Signed)
Addended by: Radene Gunning on: 10/28/2022 04:37 PM   Modules accepted: Level of Service

## 2022-10-28 NOTE — Progress Notes (Signed)
Chief Complaint  Patient presents with   Annual Exam    Well Male Mitchell Parrish is here for a complete physical.   His last physical was >1 year ago.  Current diet: in general, a "healthy" diet.   Current exercise: limited 2/2 a LE wound; some walking Weight trend: increased a bit Fatigue out of ordinary? Yes Seat belt? Yes.   Advanced directive? No  Health maintenance Shingrix- Yes Colonoscopy- Due Tetanus- Yes Hep C- Yes Lung cancer screening- Due Pneumonia vaccine- Due AAA screening- Yes - imaging noted no aneurysm on CT abd/pelv 2/24.   COPD Still smoking. Taking Symbicort and Incruse daily. Reports compliance, no AE's. Rinses mouth out after using Symbicort.   Past Medical History:  Diagnosis Date   Acute diastolic CHF (congestive heart failure) (HCC) 05/29/2018   Arthritis    left hip   Atrial fibrillation (HCC)    Emphysema lung (HCC)    Hyperlipidemia    Hypertension    Tubular adenoma of colon 11/2011     Past Surgical History:  Procedure Laterality Date   ATRIAL FIBRILLATION ABLATION N/A 04/03/2019   Procedure: ATRIAL FIBRILLATION ABLATION;  Surgeon: Regan Lemming, MD;  Location: [facility] INVASIVE CV LAB;  Service: Cardiovascular;  Laterality: N/A;   BACK SURGERY     CARDIOVERSION N/A 06/25/2018   Procedure: CARDIOVERSION (CATH LAB);  Surgeon: Marinus Maw, MD;  Location: [facility] INVASIVE CV LAB;  Service: Cardiovascular;  Laterality: N/A;   HERNIA REPAIR     SPINE SURGERY     cervical fusion c6, c7 (1994), and c3,4,5,6 (2004)   TOTAL KNEE ARTHROPLASTY Right 01/28/2019   Procedure: TOTAL KNEE ARTHROPLASTY;  Surgeon: Ollen Gross, MD;  Location: [facility] ORS;  Service: Orthopedics;  Laterality: Right;   WISDOM TOOTH EXTRACTION      Medications  Current Outpatient Medications on File Prior to Visit  Medication Sig Dispense Refill   albuterol (VENTOLIN HFA) 108 (90 Base) MCG/ACT inhaler INHALE 2 PUFFS INTO THE LUNGS EVERY 4 HOURS AS NEEDED FOR WHEEZING OR  SHORTNESS OF BREATH 8.5 g 5   co-enzyme Q-10 30 MG capsule Take 200 mg by mouth daily.     COVID-19 mRNA bivalent vaccine, Pfizer, (PFIZER COVID-19 VAC BIVALENT) injection Inject into the muscle. 0.3 mL 0   dicyclomine (BENTYL) 20 MG tablet Take 1 tablet (20 mg total) by mouth 2 (two) times daily. 20 tablet 0   fluticasone (FLONASE) 50 MCG/ACT nasal spray Place 2 sprays into both nostrils as needed for allergies or rhinitis. 16 g 5   furosemide (LASIX) 40 MG tablet Take 1 tablet (40 mg total) by mouth daily as needed. 30 tablet 11   INCRUSE ELLIPTA 62.5 MCG/ACT AEPB INHALE 1 PUFF INTO THE LUNGS DAILY 30 each 5   losartan (COZAAR) 100 MG tablet Take 1 tablet (100 mg total) by mouth daily. 90 tablet 0   metoprolol succinate (TOPROL-XL) 25 MG 24 hr tablet Take 1 tablet (25 mg total) by mouth 2 (two) times daily. Please call [phone] to schedule an appointment for September for future refills. Thank you. 180 tablet 0   potassium chloride (KLOR-CON) 10 MEQ tablet TAKE 1 TABLET BY MOUTH DAILY AS NEEDED WITH LASIX 30 tablet 11   SYMBICORT 160-4.5 MCG/ACT inhaler INHALE 1 PUFF INTO THE LUNGS IN THE MORNING AND AT BEDTIME 10.2 g 5   tiotropium (SPIRIVA HANDIHALER) 18 MCG inhalation capsule Place 1 capsule (18 mcg total) into inhaler and inhale daily. 30 capsule 12   tiZANidine (  ZANAFLEX) 2 MG tablet Take 1-2 tablets (2-4 mg total) by mouth every 6 (six) hours as needed for muscle spasms. 30 tablet 0   Allergies Allergies  Allergen Reactions   Lodine [Etodolac] Hives    Family History Family History  Problem Relation Age of Onset   Diabetes Mother    Lung cancer Mother 1   Diabetes Father    Heart disease Father    Colon cancer Father 61       rectal cancer    Review of Systems: Constitutional:  no fevers Eye: +worsening vision Ears:  No changes in hearing Nose/Mouth/Throat:  no complaints of nasal congestion, no sore throat Cardiovascular: no chest pain Respiratory:  No new shortness  of breath Gastrointestinal:  No change in bowel habits GU:  No frequency Integumentary:  no abnormal skin lesions reported Neurologic:  no headaches Endocrine:  denies unexplained weight changes  Exam BP 130/78 (BP Location: Right Arm, Patient Position: Sitting, Cuff Size: Large)   Pulse 72   Temp 98.1 F (36.7 C) (Oral)   Ht 5\' 11"  (1.803 m)   Wt 249 lb 8 oz (113.2 kg)   SpO2 93%   BMI 34.80 kg/m  General:  well developed, well nourished, in no apparent distress Skin:  no significant moles, warts, or growths Head:  no masses, lesions, or tenderness Eyes:  pupils equal and round, sclera anicteric without injection Ears:  canals without lesions, TMs shiny without retraction, no obvious effusion, no erythema Nose:  nares patent, mucosa normal Throat/Pharynx:  lips and gingiva without lesion; tongue and uvula midline; non-inflamed pharynx; no exudates or postnasal drainage Lungs:  clear to auscultation, breath sounds equal bilaterally, no respiratory distress Cardio:  regular rate and rhythm, no LE edema or bruits Rectal: Deferred GI: BS+, S, NT, ND, no masses or organomegaly Musculoskeletal:  symmetrical muscle groups noted without atrophy or deformity Neuro:  gait normal; deep tendon reflexes normal and symmetric Psych: well oriented with normal range of affect and appropriate judgment/insight  Assessment and Plan  Well adult exam  Primary hypertension - Plan: CBC, Comprehensive metabolic panel, Lipid panel  Chronic bronchitis, unspecified chronic bronchitis type (HCC)  Screening for lung cancer - Plan: Ambulatory Referral Lung Cancer Screening [facility]  Screen for colon cancer - Plan: Ambulatory referral to Gastroenterology  Screening for prostate cancer - Plan: PSA  Need for vaccination against Streptococcus pneumoniae - Plan: Pneumococcal conjugate vaccine 20-valent (Prevnar 20)   Well 67 y.o. male. Counseled on diet and exercise. Advanced directive form  provided today.  Other orders as above. CCS: Refer to GI as he is due for his colonoscopy. Lung cancer screening: Refer to pulmonology. COPD: Chronic, stable.  Continue Incruse daily, Symbicort twice daily, rinse mouth out after use.  Albuterol as needed.  Really needs to stop smoking. Follow up in 1 yr.  The patient voiced understanding and agreement to the plan.  Jilda Roche [place], DO 10/28/22 4:35 PM

## 2022-10-28 NOTE — Patient Instructions (Addendum)
Give [place] 2-3 business days to get the results of your labs back.  ? ?Keep the diet clean and stay active. ? ?Please get me a copy of your advanced directive form at your convenience.  ? ?If you do not hear anything about your referral in the next 1-2 weeks, call our office and ask for an update. ? ?Let us know if you need anything. ?

## 2022-10-29 LAB — COMPREHENSIVE METABOLIC PANEL
AG Ratio: 1.6 (calc) (ref 1.0–2.5)
ALT: 17 U/L (ref 9–46)
AST: 13 U/L (ref 10–35)
Albumin: 4.4 g/dL (ref 3.6–5.1)
Alkaline phosphatase (APISO): 82 U/L (ref 35–144)
BUN: 20 mg/dL (ref 7–25)
CO2: 23 mmol/L (ref 20–32)
Calcium: 9.2 mg/dL (ref 8.6–10.3)
Chloride: 104 mmol/L (ref 98–110)
Creat: 0.97 mg/dL (ref 0.70–1.35)
Globulin: 2.7 g/dL (calc) (ref 1.9–3.7)
Glucose, Bld: 87 mg/dL (ref 65–99)
Potassium: 4.2 mmol/L (ref 3.5–5.3)
Sodium: 138 mmol/L (ref 135–146)
Total Bilirubin: 1 mg/dL (ref 0.2–1.2)
Total Protein: 7.1 g/dL (ref 6.1–8.1)

## 2022-10-29 LAB — LIPID PANEL
Cholesterol: 229 mg/dL — ABNORMAL HIGH (ref <200)
HDL: 40 mg/dL (ref >=40)
LDL Cholesterol (Calc): 134 mg/dL (calc) — ABNORMAL HIGH
Non-HDL Cholesterol (Calc): 189 mg/dL (calc) — ABNORMAL HIGH (ref <130)
Total CHOL/HDL Ratio: 5.7 (calc) — ABNORMAL HIGH (ref <5.0)
Triglycerides: 383 mg/dL — ABNORMAL HIGH (ref <150)

## 2022-10-29 LAB — CBC
HCT: 55.1 % — ABNORMAL HIGH (ref 38.5–50.0)
Hemoglobin: 18.3 g/dL — ABNORMAL HIGH (ref 13.2–17.1)
MCH: 32.2 pg (ref 27.0–33.0)
MCHC: 33.2 g/dL (ref 32.0–36.0)
MCV: 97 fL (ref 80.0–100.0)
MPV: 10.9 fL (ref 7.5–12.5)
Platelets: 203 10*3/uL (ref 140–400)
RBC: 5.68 10*6/uL (ref 4.20–5.80)
RDW: 12.8 % (ref 11.0–15.0)
WBC: 9.2 10*3/uL (ref 3.8–10.8)

## 2022-10-29 LAB — PSA: PSA: 0.57 ng/mL (ref <=4.00)

## 2022-10-31 ENCOUNTER — Other Ambulatory Visit: Payer: Self-pay | Admitting: Family Medicine

## 2022-10-31 DIAGNOSIS — E782 Mixed hyperlipidemia: Secondary | ICD-10-CM

## 2022-10-31 DIAGNOSIS — D582 Other hemoglobinopathies: Secondary | ICD-10-CM

## 2022-11-17 DIAGNOSIS — H43811 Vitreous degeneration, right eye: Secondary | ICD-10-CM | POA: Diagnosis not present

## 2022-11-17 DIAGNOSIS — H353133 Nonexudative age-related macular degeneration, bilateral, advanced atrophic without subfoveal involvement: Secondary | ICD-10-CM | POA: Diagnosis not present

## 2022-11-17 DIAGNOSIS — H35373 Puckering of macula, bilateral: Secondary | ICD-10-CM | POA: Diagnosis not present

## 2022-11-17 DIAGNOSIS — H2513 Age-related nuclear cataract, bilateral: Secondary | ICD-10-CM | POA: Diagnosis not present

## 2022-11-17 DIAGNOSIS — H353211 Exudative age-related macular degeneration, right eye, with active choroidal neovascularization: Secondary | ICD-10-CM | POA: Diagnosis not present

## 2022-11-22 ENCOUNTER — Telehealth: Payer: Self-pay

## 2022-11-22 ENCOUNTER — Other Ambulatory Visit (INDEPENDENT_AMBULATORY_CARE_PROVIDER_SITE_OTHER): Payer: PPO

## 2022-11-22 ENCOUNTER — Other Ambulatory Visit: Payer: Self-pay | Admitting: Family Medicine

## 2022-11-22 ENCOUNTER — Encounter: Payer: Self-pay | Admitting: Family Medicine

## 2022-11-22 DIAGNOSIS — E782 Mixed hyperlipidemia: Secondary | ICD-10-CM

## 2022-11-22 DIAGNOSIS — D582 Other hemoglobinopathies: Secondary | ICD-10-CM | POA: Diagnosis not present

## 2022-11-22 LAB — LIPID PANEL
Cholesterol: 227 mg/dL — ABNORMAL HIGH (ref 0–200)
HDL: 46.9 mg/dL (ref >39.00)
LDL Cholesterol: 157 mg/dL — ABNORMAL HIGH (ref 0–99)
NonHDL: 180.22
Total CHOL/HDL Ratio: 5
Triglycerides: 115 mg/dL (ref 0.0–149.0)
VLDL: 23 mg/dL (ref 0.0–40.0)

## 2022-11-22 LAB — CBC
HCT: 55.1 % — ABNORMAL HIGH (ref 39.0–52.0)
Hemoglobin: 18.2 g/dL (ref 13.0–17.0)
MCHC: 33 g/dL (ref 30.0–36.0)
MCV: 97.5 fL (ref 78.0–100.0)
Platelets: 195 10*3/uL (ref 150.0–400.0)
RBC: 5.65 Mil/uL (ref 4.22–5.81)
RDW: 14.3 % (ref 11.5–15.5)
WBC: 6.6 10*3/uL (ref 4.0–10.5)

## 2022-11-22 MED ORDER — ROSUVASTATIN CALCIUM 20 MG PO TABS: 20.0000 mg | ORAL_TABLET | Freq: Every day | ORAL | 3 refills | Status: DC

## 2022-11-22 NOTE — Telephone Encounter (Signed)
CRITICAL VALUE STICKER  CRITICAL VALUE: Hemoglobin at 18.2  RECEIVER (on-site recipient of call): HS  DATE & TIME NOTIFIED: 11/22/2022 at 11:00  MESSENGER (representative from lab): Clydie Braun  MD NOTIFIED: Sent to PCP  TIME OF NOTIFICATION:  RESPONSE:

## 2022-11-24 LAB — ERYTHROPOIETIN: Erythropoietin: 11.7 m[IU]/mL (ref 2.6–18.5)

## 2022-11-28 ENCOUNTER — Other Ambulatory Visit: Payer: PPO

## 2022-12-21 ENCOUNTER — Other Ambulatory Visit: Payer: Self-pay | Admitting: Cardiology

## 2022-12-22 ENCOUNTER — Other Ambulatory Visit: Payer: Self-pay | Admitting: Cardiology

## 2022-12-28 ENCOUNTER — Inpatient Hospital Stay: Payer: PPO | Attending: Hematology & Oncology

## 2022-12-28 ENCOUNTER — Inpatient Hospital Stay: Payer: PPO | Admitting: Hematology & Oncology

## 2022-12-28 ENCOUNTER — Encounter: Payer: Self-pay | Admitting: Hematology & Oncology

## 2022-12-28 VITALS — BP 154/70 | HR 75 | Temp 98.6°F | Resp 20 | Ht 71.0 in | Wt 247.1 lb

## 2022-12-28 DIAGNOSIS — F17210 Nicotine dependence, cigarettes, uncomplicated: Secondary | ICD-10-CM | POA: Diagnosis not present

## 2022-12-28 DIAGNOSIS — Z79899 Other long term (current) drug therapy: Secondary | ICD-10-CM | POA: Diagnosis not present

## 2022-12-28 DIAGNOSIS — R0602 Shortness of breath: Secondary | ICD-10-CM | POA: Diagnosis not present

## 2022-12-28 DIAGNOSIS — I4891 Unspecified atrial fibrillation: Secondary | ICD-10-CM | POA: Insufficient documentation

## 2022-12-28 DIAGNOSIS — G4730 Sleep apnea, unspecified: Secondary | ICD-10-CM | POA: Diagnosis not present

## 2022-12-28 DIAGNOSIS — Z7982 Long term (current) use of aspirin: Secondary | ICD-10-CM | POA: Diagnosis not present

## 2022-12-28 DIAGNOSIS — D751 Secondary polycythemia: Secondary | ICD-10-CM

## 2022-12-28 DIAGNOSIS — J439 Emphysema, unspecified: Secondary | ICD-10-CM | POA: Diagnosis not present

## 2022-12-28 DIAGNOSIS — I5032 Chronic diastolic (congestive) heart failure: Secondary | ICD-10-CM | POA: Diagnosis not present

## 2022-12-28 DIAGNOSIS — I110 Hypertensive heart disease with heart failure: Secondary | ICD-10-CM | POA: Diagnosis not present

## 2022-12-28 HISTORY — DX: Secondary polycythemia: D75.1

## 2022-12-28 LAB — CMP (CANCER CENTER ONLY)
ALT: 26 U/L (ref 0–44)
AST: 18 U/L (ref 15–41)
Albumin: 4.4 g/dL (ref 3.5–5.0)
Alkaline Phosphatase: 70 U/L (ref 38–126)
Anion gap: 10 (ref 5–15)
BUN: 21 mg/dL (ref 8–23)
CO2: 27 mmol/L (ref 22–32)
Calcium: 9.3 mg/dL (ref 8.9–10.3)
Chloride: 103 mmol/L (ref 98–111)
Creatinine: 0.99 mg/dL (ref 0.61–1.24)
GFR, Estimated: >60 mL/min (ref >60)
Glucose, Bld: 105 mg/dL — ABNORMAL HIGH (ref 70–99)
Potassium: 4 mmol/L (ref 3.5–5.1)
Sodium: 140 mmol/L (ref 135–145)
Total Bilirubin: 1.5 mg/dL — ABNORMAL HIGH (ref <1.2)
Total Protein: 7.3 g/dL (ref 6.5–8.1)

## 2022-12-28 LAB — CBC WITH DIFFERENTIAL (CANCER CENTER ONLY)
Abs Immature Granulocytes: 0.03 10*3/uL (ref 0.00–0.07)
Basophils Absolute: 0.1 10*3/uL (ref 0.0–0.1)
Basophils Relative: 1 %
Eosinophils Absolute: 0.3 10*3/uL (ref 0.0–0.5)
Eosinophils Relative: 4 %
HCT: 51 % (ref 39.0–52.0)
Hemoglobin: 17.4 g/dL — ABNORMAL HIGH (ref 13.0–17.0)
Immature Granulocytes: 0 %
Lymphocytes Relative: 25 %
Lymphs Abs: 2.2 10*3/uL (ref 0.7–4.0)
MCH: 32.5 pg (ref 26.0–34.0)
MCHC: 34.1 g/dL (ref 30.0–36.0)
MCV: 95.1 fL (ref 80.0–100.0)
Monocytes Absolute: 0.7 10*3/uL (ref 0.1–1.0)
Monocytes Relative: 8 %
Neutro Abs: 5.5 10*3/uL (ref 1.7–7.7)
Neutrophils Relative %: 62 %
Platelet Count: 180 10*3/uL (ref 150–400)
RBC: 5.36 MIL/uL (ref 4.22–5.81)
RDW: 13.4 % (ref 11.5–15.5)
WBC Count: 8.8 10*3/uL (ref 4.0–10.5)
nRBC: 0 % (ref 0.0–0.2)

## 2022-12-28 LAB — FERRITIN: Ferritin: 215 ng/mL (ref 24–336)

## 2022-12-28 LAB — RETICULOCYTES
Immature Retic Fract: 10.1 % (ref 2.3–15.9)
RBC.: 5.41 MIL/uL (ref 4.22–5.81)
Retic Count, Absolute: 92 10*3/uL (ref 19.0–186.0)
Retic Ct Pct: 1.7 % (ref 0.4–3.1)

## 2022-12-28 LAB — SAVE SMEAR(SSMR), FOR PROVIDER SLIDE REVIEW

## 2022-12-28 LAB — VITAMIN B12: Vitamin B-12: 114 pg/mL — ABNORMAL LOW (ref 180–914)

## 2022-12-28 LAB — LACTATE DEHYDROGENASE: LDH: 221 U/L — ABNORMAL HIGH (ref 98–192)

## 2022-12-28 NOTE — Progress Notes (Signed)
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  HPI: Mitchell Parrish is a very nice 67 year old white male.  He actually is the husband of one of our patients.  He is followed by Dr. Carmelia Roller.  As always, Dr. Carmelia Roller is very thorough.  Mitchell Parrish has COPD.  He has hypertension.  He has sleep apnea.  He also has macular degeneration.  Dr. Carmelia Roller has noted that Mitchell Parrish has had an elevated hemoglobin hematocrit.  In reviewing all the labs, back in 2011, his hemoglobin was 16.9 with hematocrit of 48.5.  His blood counts have trended.  He has donated quite a bit of blood.  I am sure this may be why there was a few years that he had a relatively normal hemoglobin hematocrit.  He does not do any testosterone supplements.  He says he does not use a CPAP as much as he should.  He says uses it maybe once a week.  He does have underlying emphysema/COPD.  In 2022, his white cell count is 8.7.  Hemoglobin 15.1.  Platelet count 239,000.  In February 2024, his hemoglobin seemed to trend upward.  His white cell count was 9.1.  Hemoglobin 18 hematocrit 51.6.  Platelet count 205,000.  MCV was 95.  Most recently in October, his white cell count 6.6.  Hemoglobin 18.2.  Hematocrit 55.1.  Platelet count 195,000.  He is on baby aspirin.  He says he does have some easy bruising.  He has had no change in bowel or bladder habits.  He has had no weight loss or weight gain.  He is not a vegetarian.  He does have a significant tobacco use history.  He has smoked for 54 years.  He has about 100-pack-year history of tobacco use.  He probably smokes about a pack a day.  He has have alcoholic beverage intake.  As far as he knows, there is no history in the family of any kind of blood problems.  He has had surgery before.  He had a major surgery for an injury to his right lower leg.  This  is quite significant.  I would say that he is healed up quite nicely.  He has had knee surgery.  He has not had COVID.  Overall, I would say that his performance status is probably ECOG 0.    Past Medical History:  Diagnosis Date   Acute diastolic CHF (congestive heart failure) (HCC) 05/29/2018   Arthritis    left hip   Atrial fibrillation (HCC)    Emphysema lung (HCC)    Hyperlipidemia    Hypertension    Tubular adenoma of colon 11/2011  :   Past Surgical History:  Procedure Laterality Date   ATRIAL FIBRILLATION ABLATION N/A 04/03/2019   Procedure: ATRIAL FIBRILLATION ABLATION;  Surgeon: Regan Lemming, MD;  Location: [facility] INVASIVE CV LAB;  Service: Cardiovascular;  Laterality: N/A;   BACK SURGERY     CARDIOVERSION N/A 06/25/2018   Procedure: CARDIOVERSION (CATH LAB);  Surgeon: Marinus Maw, MD;  Location: [facility] INVASIVE CV LAB;  Service: Cardiovascular;  Laterality: N/A;   HERNIA REPAIR     SPINE SURGERY     cervical fusion c6, c7 (1994), and c3,4,5,6 (2004)   TOTAL KNEE ARTHROPLASTY Right 01/28/2019   Procedure: TOTAL KNEE ARTHROPLASTY;  Surgeon: Ollen Gross, MD;  Location: [facility];  Service: Orthopedics;  Laterality: Right;   WISDOM TOOTH EXTRACTION    :   Current Outpatient Medications:    albuterol (VENTOLIN HFA) 108 (90 Base) MCG/ACT inhaler, INHALE 2 PUFFS INTO THE LUNGS EVERY 4 HOURS AS NEEDED FOR WHEEZING OR SHORTNESS OF BREATH, Disp: 8.5 g, Rfl: 5   co-enzyme Q-10 30 MG capsule, Take 200 mg by mouth daily., Disp: , Rfl:    fluticasone (FLONASE) 50 MCG/ACT nasal spray, Place 2 sprays into both nostrils as needed for allergies or rhinitis., Disp: 16 g, Rfl: 5   furosemide (LASIX) 40 MG tablet, TAKE 1 TABLET BY MOUTH DAILY AS NEEDED, Disp: 15 tablet, Rfl: 0   INCRUSE ELLIPTA 62.5 MCG/ACT AEPB, INHALE 1 PUFF INTO THE LUNGS DAILY, Disp: 30 each, Rfl: 5   losartan (COZAAR) 100 MG tablet, Take 1 tablet (100 mg total) by mouth daily., Disp: 15 tablet, Rfl: 0    metoprolol succinate (TOPROL-XL) 25 MG 24 hr tablet, Take 1 tablet (25 mg total) by mouth 2 (two) times daily., Disp: 30 tablet, Rfl: 0   potassium chloride (KLOR-CON) 10 MEQ tablet, TAKE 1 TABLET BY MOUTH DAILY AS NEEDED WITH LASIX, Disp: 15 tablet, Rfl: 0   rosuvastatin (CRESTOR) 20 MG tablet, Take 1 tablet (20 mg total) by mouth daily., Disp: 90 tablet, Rfl: 3   SYMBICORT 160-4.5 MCG/ACT inhaler, INHALE 1 PUFF INTO THE LUNGS IN THE MORNING AND AT BEDTIME, Disp: 10.2 g, Rfl: 5:  :   Allergies  Allergen Reactions   Lodine [Etodolac] Hives  :   Family History  Problem Relation Age of Onset   Diabetes Mother    Lung cancer Mother 28   Diabetes Father    Heart disease Father    Colon cancer Father 39       rectal cancer  :   Social History   Socioeconomic History   Marital status: Married    Spouse name: Not on file   Number of children: 4   Years of education: Not on file   Highest education level: 12th grade  Occupational History   Occupation: Environmental education officer: [employer]  Tobacco Use   Smoking status: Some Days    Current packs/day: 0.50    Average packs/day: 0.5 packs/day for 45.0 years (22.5 ttl pk-yrs)    Types: Cigarettes   Smokeless tobacco: Never  Vaping Use   Vaping status: Never Used  Substance and Sexual Activity   Alcohol use: Yes    Alcohol/week: 1.0 - 2.0 standard drink of alcohol    Types: 1 - 2 Cans of beer per week    Comment: 2-3 drinks/ day   Drug use: No   Sexual activity: Yes  Other Topics Concern   Not on file  Social History Narrative   Not on file   Social Determinants of Health   Financial Resource Strain: Low Risk  (07/27/2022)   Overall Financial Resource Strain (CARDIA)    Difficulty of Paying Living Expenses: Not hard at all  Food Insecurity: No Food Insecurity (12/28/2022)   Hunger Vital Sign    Worried About Running Out of Food in the Last Year: Never true    Ran Out of Food in the Last Year: Never true   Transportation Needs: No Transportation Needs (12/28/2022)   PRAPARE - Administrator, Civil Service (Medical): No    Lack of Transportation (Non-Medical): No  Physical Activity: Unknown (05/19/2022)   Exercise Vital Sign    Days of Exercise per Week:  0 days    Minutes of Exercise per Session: Not on file  Stress: No Stress Concern Present (05/19/2022)   [employer] of Occupational Health - Occupational Stress Questionnaire    Feeling of Stress : Not at all  Social Connections: Unknown (05/19/2022)   Social Connection and Isolation Panel [NHANES]    Frequency of Communication with Friends and Family: Patient declined    Frequency of Social Gatherings with Friends and Family: Patient declined    Attends Religious Services: Patient declined    Database administrator or Organizations: Patient declined    Attends Banker Meetings: Not on file    Marital Status: Married  Intimate Partner Violence: Not At Risk (12/28/2022)   Humiliation, Afraid, Rape, and Kick questionnaire    Fear of Current or Ex-Partner: No    Emotionally Abused: No    Physically Abused: No    Sexually Abused: No  :  Review of Systems  Constitutional: Negative.   HENT: Negative.    Eyes:  Positive for photophobia.  Respiratory:  Positive for shortness of breath.   Cardiovascular:  Positive for palpitations and leg swelling.  Gastrointestinal: Negative.   Genitourinary: Negative.   Musculoskeletal:  Positive for joint pain and myalgias.  Skin: Negative.   Neurological: Negative.   Endo/Heme/Allergies:  Bruises/bleeds easily.  Psychiatric/Behavioral: Negative.       Exam: Vital signs show temperature of 98.6.  Pulse 75.  Blood pressure 154/70.  Weight is 247 pounds.  @IPVITALS @ Physical Exam Vitals reviewed.  HENT:     Head: Normocephalic and atraumatic.  Eyes:     Pupils: Pupils are equal, round, and reactive to light.     Comments: The conjunctive are slightly inflamed.   Cardiovascular:     Rate and Rhythm: Normal rate and regular rhythm.     Heart sounds: Normal heart sounds.  Pulmonary:     Effort: Pulmonary effort is normal.     Breath sounds: Normal breath sounds.  Abdominal:     General: Bowel sounds are normal.     Palpations: Abdomen is soft.     Comments: Abdominal exam is soft.  He has good bowel sounds.  He is obese.  There is no fluid wave.  I cannot palpate a spleen.  Musculoskeletal:        General: No tenderness or deformity. Normal range of motion.     Cervical back: Normal range of motion.  Lymphadenopathy:     Cervical: No cervical adenopathy.  Skin:    General: Skin is warm and dry.     Findings: No erythema or rash.     Comments: He definitely has a ruddy complexion to his skin.  Neurological:     Mental Status: He is alert and oriented to person, place, and time.  Psychiatric:        Behavior: Behavior normal.        Thought Content: Thought content normal.        Judgment: Judgment normal.     Recent Labs    12/28/22 1339  WBC 8.8  HGB 17.4*  HCT 51.0  PLT 180    Recent Labs    12/28/22 1339  NA 140  K 4.0  CL 103  CO2 27  GLUCOSE 105*  BUN 21  CREATININE 0.99  CALCIUM 9.3    Blood smear review: Normochromic and normocytic population of red blood cells.  There are no nucleated red blood cells.  I see no teardrop cells.  He has no schistocytes or spherocytes.  There are no rouleaux formation.  He has no inclusion bodies.  White blood cells appear normal in morphology and maturation.  There is no immature myeloid or lymphoid cells.  I see no hypersegmented polys.  Platelets are adequate in number and size.  Platelets are well granulated.  Pathology: None    Assessment and Plan: Mitchell Parrish is a very nice 67 year old white male.  I had to believe that this is classic for Spurious polycythemia.  He is obese.  He smokes.  Has hypertension.  His blood smear certainly does not look like he has an overactive  bone marrow.  Again I have to believe that this is going to be a spurious or secondary type of polycythemia.  I we will still send off a JAK2 assay on the blood just to make sure that we are not overlooking a primary bone marrow condition.  He is taking baby aspirin.  I think this is very important for him.  I told him that he could easily donate blood again.  He had donated blood in the past.  I really think this would be beneficial for him in the community.  He clearly has enough blood to donate.  Hide do not see that we have to do any scans on him.  He clearly does not need to have a bone marrow biopsy done.  I think if he uses CPAP machine daily, this may help some of the erythrocytosis.  For right now, we will just plan to get him back to see him probably after the New Year.  If all looks good, then we can try to see him maybe 3 or 4 times a year.

## 2022-12-29 DIAGNOSIS — H35373 Puckering of macula, bilateral: Secondary | ICD-10-CM | POA: Diagnosis not present

## 2022-12-29 DIAGNOSIS — H353123 Nonexudative age-related macular degeneration, left eye, advanced atrophic without subfoveal involvement: Secondary | ICD-10-CM | POA: Diagnosis not present

## 2022-12-29 DIAGNOSIS — H2513 Age-related nuclear cataract, bilateral: Secondary | ICD-10-CM | POA: Diagnosis not present

## 2022-12-29 DIAGNOSIS — H353211 Exudative age-related macular degeneration, right eye, with active choroidal neovascularization: Secondary | ICD-10-CM | POA: Diagnosis not present

## 2022-12-29 DIAGNOSIS — H43811 Vitreous degeneration, right eye: Secondary | ICD-10-CM | POA: Diagnosis not present

## 2022-12-29 LAB — IRON AND IRON BINDING CAPACITY (CC-WL,HP ONLY)
Iron: 144 ug/dL (ref 45–182)
Saturation Ratios: 38 % (ref 17.9–39.5)
TIBC: 381 ug/dL (ref 250–450)
UIBC: 237 ug/dL (ref 117–376)

## 2022-12-29 LAB — ERYTHROPOIETIN: Erythropoietin: 2.7 m[IU]/mL (ref 2.6–18.5)

## 2023-01-03 LAB — JAK2 (INCLUDING V617F AND EXON 12), MPL,& CALR-NEXT GEN SEQ

## 2023-01-18 ENCOUNTER — Other Ambulatory Visit: Payer: Self-pay | Admitting: Cardiology

## 2023-01-18 ENCOUNTER — Other Ambulatory Visit: Payer: Self-pay | Admitting: Family Medicine

## 2023-01-18 DIAGNOSIS — J42 Unspecified chronic bronchitis: Secondary | ICD-10-CM

## 2023-01-19 ENCOUNTER — Other Ambulatory Visit: Payer: Self-pay

## 2023-01-19 MED ORDER — FUROSEMIDE 40 MG PO TABS: 40.0000 mg | ORAL_TABLET | Freq: Every day | ORAL | 0 refills | Status: DC | PRN

## 2023-02-02 NOTE — Progress Notes (Signed)
Cardiology Office Note:  .   Date:  02/02/2023  ID:  Mitchell Parrish, DOB [birth year], MRN [id number] PCP: Sharlene Dory, DO  [facility] Providers Cardiologist:  None Electrophysiologist:  Will Jorja Loa, MD {  History of Present Illness: .   Mitchell Parrish is a 67 y.o. 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 Elberta Fortis 10/25/21, EF had normalized with rhythm control, no symptoms of his AFib, denied symptoms Low risk score, no on OAC No changes were made   Today's visit is scheduled as an annual visit  ROS:   He is struggling with macular degeneration (bot wet/dry) getting injections, though losing his vision. Saw heme for elevated Hct, reports their recommendation was to donate blood Otherwise doing OK, has some baseline SOB with his COPD, nothing out of the usual No CP, palpitations He was quite symptomatic with his AFib and feels his symptoms would be clear, none since his ablation Checks his rhythm daily via his Lourena Simmonds as well. No near syncope or syncope  Arrhythmia/AAD hx AFib diagnosis goes back to 2020 Amiodarone 2020 >> stopped Jun 2021 PVI ablation 04/03/2019  Studies Reviewed: Marland Kitchen    EKG not done today  04/03/19: EPS/ablation CONCLUSIONS: 1. Atrial fibrillation upon presentation.   2. Successful electrical isolation and anatomical encircling of all four pulmonary veins with radiofrequency current.  A WACA approach was used 3. Additional left atrial ablation was performed with a standard box lesion created along the posterior wall of the left atrium 4. No early apparent complications.    TTE 01/01/19    1. Left ventricular ejection fraction, by visual estimation, is 60 to 65%. The left ventricle has normal function. There is no left ventricular hypertrophy.  2. The left ventricle has no regional wall motion abnormalities.  3. Left ventricular diastolic parameters are consistent with Grade II diastolic  dysfunction (pseudonormalization).  4. Global right ventricle has normal systolic function.The right ventricular size is normal. No increase in right ventricular wall thickness.  5. Left atrial size was normal.  6. Right atrial size was normal.  7. The mitral valve is normal in structure. Trace mitral valve regurgitation. No evidence of mitral stenosis.  8. The tricuspid valve is normal in structure. Tricuspid valve regurgitation is trivial.  9. The aortic valve is tricuspid. Aortic valve regurgitation is not visualized. No evidence of aortic valve sclerosis or stenosis. 10. There is mild dilatation of the aortic root measuring 38 mm. 11. The inferior vena cava is normal in size with greater than 50% respiratory variability, suggesting right atrial pressure of 3 mmHg. 12. The tricuspid regurgitant velocity is 2.51 m/s, and with an assumed right atrial pressure of 3 mmHg, the estimated right ventricular systolic pressure is normal at 28.2 mmHg.   Risk Assessment/Calculations:    Physical Exam:   VS:  There were no vitals taken for this visit.   Wt Readings from Last 3 Encounters:  12/28/22 247 lb 1.9 oz (112.1 kg)  10/28/22 249 lb 8 oz (113.2 kg)  05/20/22 251 lb 4 oz (114 kg)    GEN: Well nourished, well developed in no acute distress NECK: No JVD; No carotid bruits CARDIAC: RRR, no murmurs, rubs, gallops RESPIRATORY:  CTA b/l without rales, wheezing or rhonchi  ABDOMEN: Soft, non-tender, non-distended EXTREMITIES:  No edema; No deformity   ASSESSMENT AND PLAN: .    persistent AFib CHA2DS2Vasc is 2 (age/HTN) >> now that he is >65 No symptoms  of AFib Has a reliable way of monitoring Daily checks on his Lourena Simmonds have not noted any AFib  Discussed risk score has changed, if he were to start seeing/feeling AFib, notify us and we would resume OAC   HTN Looks OK       Dispo: we will continue annual visits, sooner if needed  Signed, Sheilah Pigeon, PA-C

## 2023-02-03 ENCOUNTER — Encounter: Payer: Self-pay | Admitting: Physician Assistant

## 2023-02-03 ENCOUNTER — Ambulatory Visit: Payer: PPO | Attending: Physician Assistant | Admitting: Physician Assistant

## 2023-02-03 VITALS — BP 134/86 | HR 73 | Ht 71.0 in | Wt 257.2 lb

## 2023-02-03 DIAGNOSIS — I4819 Other persistent atrial fibrillation: Secondary | ICD-10-CM

## 2023-02-03 DIAGNOSIS — I10 Essential (primary) hypertension: Secondary | ICD-10-CM

## 2023-02-03 MED ORDER — METOPROLOL SUCCINATE ER 25 MG PO TB24: 25.0000 mg | ORAL_TABLET | Freq: Two times a day (BID) | ORAL | 3 refills | Status: DC

## 2023-02-03 MED ORDER — LOSARTAN POTASSIUM 100 MG PO TABS: 100.0000 mg | ORAL_TABLET | Freq: Every day | ORAL | 3 refills | Status: DC

## 2023-02-03 MED ORDER — POTASSIUM CHLORIDE ER 10 MEQ PO TBCR: EXTENDED_RELEASE_TABLET | ORAL | 0 refills | Status: DC

## 2023-02-03 MED ORDER — FUROSEMIDE 40 MG PO TABS: 40.0000 mg | ORAL_TABLET | Freq: Every day | ORAL | 0 refills | Status: DC | PRN

## 2023-02-03 NOTE — Patient Instructions (Signed)
Medication Instructions:   Your physician recommends that you continue on your current medications as directed. Please refer to the Current Medication list given to you today.   *If you need a refill on your cardiac medications before your next appointment, please call your pharmacy*   Lab Work: NONE ORDERED  TODAY   If you have labs (blood work) drawn today and your tests are completely normal, you will receive your results only by: MyChart Message (if you have MyChart) OR A paper copy in the mail If you have any lab test that is abnormal or we need to change your treatment, we will call you to review the results.   Testing/Procedures:  NONE ORDERED  TODAY     Follow-Up: At [facility], you and your health needs are our priority.  As part of our continuing mission to provide you with exceptional heart care, we have created designated Provider Care Teams.  These Care Teams include your primary Cardiologist (physician) and Advanced Practice Providers (APPs -  Physician Assistants and Nurse Practitioners) who all work together to provide you with the care you need, when you need it.  We recommend signing up for the patient portal called "MyChart".  Sign up information is provided on this After Visit Summary.  MyChart is used to connect with patients for Virtual Visits (Telemedicine).  Patients are able to view lab/test results, encounter notes, upcoming appointments, etc.  Non-urgent messages can be sent to your provider as well.   To learn more about what you can do with MyChart, go to [email or web address].    Your next appointment:   1 year(s)  Provider:   Loman Brooklyn, MD or Francis Dowse, PA-C    Other Instructions

## 2023-02-21 ENCOUNTER — Ambulatory Visit: Payer: PPO | Admitting: Hematology & Oncology

## 2023-02-21 ENCOUNTER — Inpatient Hospital Stay: Payer: PPO

## 2023-02-22 DIAGNOSIS — H35373 Puckering of macula, bilateral: Secondary | ICD-10-CM | POA: Diagnosis not present

## 2023-02-22 DIAGNOSIS — H43811 Vitreous degeneration, right eye: Secondary | ICD-10-CM | POA: Diagnosis not present

## 2023-02-22 DIAGNOSIS — H353211 Exudative age-related macular degeneration, right eye, with active choroidal neovascularization: Secondary | ICD-10-CM | POA: Diagnosis not present

## 2023-02-22 DIAGNOSIS — H2513 Age-related nuclear cataract, bilateral: Secondary | ICD-10-CM | POA: Diagnosis not present

## 2023-02-22 DIAGNOSIS — H353133 Nonexudative age-related macular degeneration, bilateral, advanced atrophic without subfoveal involvement: Secondary | ICD-10-CM | POA: Diagnosis not present

## 2023-03-08 ENCOUNTER — Inpatient Hospital Stay: Payer: PPO | Admitting: Hematology & Oncology

## 2023-03-08 ENCOUNTER — Inpatient Hospital Stay: Payer: PPO | Attending: Hematology & Oncology

## 2023-03-08 ENCOUNTER — Encounter: Payer: Self-pay | Admitting: Hematology & Oncology

## 2023-03-08 VITALS — BP 152/75 | HR 72 | Temp 98.2°F | Resp 18 | Ht 71.0 in | Wt 253.1 lb

## 2023-03-08 DIAGNOSIS — D751 Secondary polycythemia: Secondary | ICD-10-CM | POA: Insufficient documentation

## 2023-03-08 DIAGNOSIS — Z1509 Genetic susceptibility to other malignant neoplasm: Secondary | ICD-10-CM | POA: Diagnosis not present

## 2023-03-08 DIAGNOSIS — Z7982 Long term (current) use of aspirin: Secondary | ICD-10-CM | POA: Insufficient documentation

## 2023-03-08 DIAGNOSIS — Z7951 Long term (current) use of inhaled steroids: Secondary | ICD-10-CM | POA: Diagnosis not present

## 2023-03-08 DIAGNOSIS — Z79899 Other long term (current) drug therapy: Secondary | ICD-10-CM | POA: Insufficient documentation

## 2023-03-08 DIAGNOSIS — E538 Deficiency of other specified B group vitamins: Secondary | ICD-10-CM | POA: Insufficient documentation

## 2023-03-08 LAB — CMP (CANCER CENTER ONLY)
ALT: 28 U/L (ref 0–44)
AST: 18 U/L (ref 15–41)
Albumin: 4.7 g/dL (ref 3.5–5.0)
Alkaline Phosphatase: 71 U/L (ref 38–126)
Anion gap: 9 (ref 5–15)
BUN: 14 mg/dL (ref 8–23)
CO2: 29 mmol/L (ref 22–32)
Calcium: 9.2 mg/dL (ref 8.9–10.3)
Chloride: 102 mmol/L (ref 98–111)
Creatinine: 0.91 mg/dL (ref 0.61–1.24)
GFR, Estimated: >60 mL/min (ref >60)
Glucose, Bld: 95 mg/dL (ref 70–99)
Potassium: 4 mmol/L (ref 3.5–5.1)
Sodium: 140 mmol/L (ref 135–145)
Total Bilirubin: 1.3 mg/dL — ABNORMAL HIGH (ref 0.0–1.2)
Total Protein: 7 g/dL (ref 6.5–8.1)

## 2023-03-08 LAB — CBC WITH DIFFERENTIAL (CANCER CENTER ONLY)
Abs Immature Granulocytes: 0.04 10*3/uL (ref 0.00–0.07)
Basophils Absolute: 0.1 10*3/uL (ref 0.0–0.1)
Basophils Relative: 1 %
Eosinophils Absolute: 0.4 10*3/uL (ref 0.0–0.5)
Eosinophils Relative: 4 %
HCT: 47.3 % (ref 39.0–52.0)
Hemoglobin: 15.7 g/dL (ref 13.0–17.0)
Immature Granulocytes: 0 %
Lymphocytes Relative: 24 %
Lymphs Abs: 2.3 10*3/uL (ref 0.7–4.0)
MCH: 32.3 pg (ref 26.0–34.0)
MCHC: 33.2 g/dL (ref 30.0–36.0)
MCV: 97.3 fL (ref 80.0–100.0)
Monocytes Absolute: 0.7 10*3/uL (ref 0.1–1.0)
Monocytes Relative: 7 %
Neutro Abs: 6 10*3/uL (ref 1.7–7.7)
Neutrophils Relative %: 64 %
Platelet Count: 216 10*3/uL (ref 150–400)
RBC: 4.86 MIL/uL (ref 4.22–5.81)
RDW: 13.4 % (ref 11.5–15.5)
WBC Count: 9.4 10*3/uL (ref 4.0–10.5)
nRBC: 0 % (ref 0.0–0.2)

## 2023-03-08 LAB — IRON AND IRON BINDING CAPACITY (CC-WL,HP ONLY)
Iron: 115 ug/dL (ref 45–182)
Saturation Ratios: 28 % (ref 17.9–39.5)
TIBC: 417 ug/dL (ref 250–450)
UIBC: 302 ug/dL (ref 117–376)

## 2023-03-08 LAB — FERRITIN: Ferritin: 205 ng/mL (ref 24–336)

## 2023-03-08 NOTE — Progress Notes (Signed)
Hematology and Oncology Follow Up Visit  Mitchell Parrish [id number] 03/20/1955 68 y.o. 03/08/2023   Principle Diagnosis:  Spurious polycythemia  Current Therapy:   Patient is donating blood to [facility] EC ASA 81 mg p.o. daily     Interim History:  Mitchell Parrish is back for follow-up.  This is his second office visit.  We first saw him back in November.  At that time, we did our workup.  We did do the JAK2 assay.  This was all negative.  However, he was found to have a TET2 abnormality.  Again we will have to watch this.  He also was found to have a low B12 level.  I am little surprised at this.  He is totally asymptomatic with this something his B12 level is 114.  His iron studies that we did on him showed a ferritin of 215 with an iron saturation of 38%.  He is doing okay.  He says he is donated once.  He apparently donated pure red cells.  Not sure exactly how or why this was done.  He concern to give a whole pint of blood.  He is still working.  He has had no problems with headache.  He is on baby aspirin.  He has had no problem with COVID.Marland Kitchen  He is on quite a few medications.  Has had no obvious bleeding.  There is no change in bowel or bladder habits.  Overall, I would say his performance status is probably ECOG 0.  Medications:  Current Outpatient Medications:    albuterol (VENTOLIN HFA) 108 (90 Base) MCG/ACT inhaler, Inhale 2 puffs into the lungs every 4 (four) hours as needed for wheezing or shortness of breath., Disp: 18 g, Rfl: 5   aspirin EC 81 MG tablet, Take 81 mg by mouth daily. Swallow whole., Disp: , Rfl:    Avacincaptad Pegol (IZERVAY IZ), Inject as directed., Disp: , Rfl:    co-enzyme Q-10 30 MG capsule, Take 200 mg by mouth daily., Disp: , Rfl:    Faricimab-svoa (VABYSMO IZ), Inject as directed., Disp: , Rfl:    fluticasone (FLONASE) 50 MCG/ACT nasal spray, Place 2 sprays into both nostrils as needed for allergies or rhinitis., Disp: 16 g, Rfl: 5    furosemide (LASIX) 40 MG tablet, Take 1 tablet (40 mg total) by mouth daily as needed., Disp: 90 tablet, Rfl: 0   losartan (COZAAR) 100 MG tablet, Take 1 tablet (100 mg total) by mouth daily., Disp: 90 tablet, Rfl: 3   metoprolol succinate (TOPROL-XL) 25 MG 24 hr tablet, Take 1 tablet (25 mg total) by mouth 2 (two) times daily., Disp: 180 tablet, Rfl: 3   potassium chloride (KLOR-CON) 10 MEQ tablet, TAKE 1 TABLET BY MOUTH DAILY AS NEEDED WITH LASIX, Disp: 90 tablet, Rfl: 0   rosuvastatin (CRESTOR) 20 MG tablet, Take 1 tablet (20 mg total) by mouth daily., Disp: 90 tablet, Rfl: 3   SYMBICORT 160-4.5 MCG/ACT inhaler, Inhale 1 puff into the lungs 2 (two) times daily. in the morning and at bedtime., Disp: 10.2 g, Rfl: 5   umeclidinium bromide (INCRUSE ELLIPTA) 62.5 MCG/ACT AEPB, Inhale 1 puff into the lungs daily., Disp: 30 each, Rfl: 5  Allergies:  Allergies  Allergen Reactions   Lodine [Etodolac] Hives    Past Medical History, Surgical history, Social history, and Family History were reviewed and updated.  Review of Systems: Review of Systems  Constitutional: Negative.   HENT:  Negative.    Eyes: Negative.  Respiratory: Negative.    Cardiovascular: Negative.   Gastrointestinal: Negative.   Endocrine: Negative.   Genitourinary: Negative.    Musculoskeletal: Negative.   Skin: Negative.   Neurological: Negative.   Hematological: Negative.   Psychiatric/Behavioral: Negative.      Physical Exam:  height is 5\' 11"  (1.803 m) and weight is 253 lb 1.3 oz (114.8 kg). His oral temperature is 98.2 F (36.8 C). His blood pressure is 152/75 (abnormal) and his pulse is 72. His respiration is 18 and oxygen saturation is 95%.   Wt Readings from Last 3 Encounters:  03/08/23 253 lb 1.3 oz (114.8 kg)  02/03/23 257 lb 3.2 oz (116.7 kg)  12/28/22 247 lb 1.9 oz (112.1 kg)    Physical Exam Vitals reviewed.  HENT:     Head: Normocephalic and atraumatic.  Eyes:     Pupils: Pupils are equal,  round, and reactive to light.  Cardiovascular:     Rate and Rhythm: Normal rate and regular rhythm.     Heart sounds: Normal heart sounds.  Pulmonary:     Effort: Pulmonary effort is normal.     Breath sounds: Normal breath sounds.  Abdominal:     General: Bowel sounds are normal.     Palpations: Abdomen is soft.  Musculoskeletal:        General: No tenderness or deformity. Normal range of motion.     Cervical back: Normal range of motion.  Lymphadenopathy:     Cervical: No cervical adenopathy.  Skin:    General: Skin is warm and dry.     Findings: No erythema or rash.  Neurological:     Mental Status: He is alert and oriented to person, place, and time.  Psychiatric:        Behavior: Behavior normal.        Thought Content: Thought content normal.        Judgment: Judgment normal.      Lab Results  Component Value Date   WBC 9.4 03/08/2023   HGB 15.7 03/08/2023   HCT 47.3 03/08/2023   MCV 97.3 03/08/2023   PLT 216 03/08/2023     Chemistry      Component Value Date/Time   NA 140 03/08/2023 1150   NA 140 03/18/2019 0857   K 4.0 03/08/2023 1150   CL 102 03/08/2023 1150   CO2 29 03/08/2023 1150   BUN 14 03/08/2023 1150   BUN 10 03/18/2019 0857   CREATININE 0.91 03/08/2023 1150   CREATININE 0.97 10/28/2022 1518      Component Value Date/Time   CALCIUM 9.2 03/08/2023 1150   ALKPHOS 71 03/08/2023 1150   AST 18 03/08/2023 1150   ALT 28 03/08/2023 1150   BILITOT 1.3 (H) 03/08/2023 1150      Impression and Plan: Mitchell Parrish is a very nice 68 year old white male.  He has polycythemia.  Again this is spurious or secondary polycythemia.  He does not have a JAK2 mutation.  He is responding well to his donations.  Again he can donate every couple months for my opinion.  I will not sure what to really make of the low B12 level since he is not anemic.  I would like to get him back to see me in about 3 months or so.  Again, I told him that he could certainly donate  blood every 2 or 3 months.   Mitchell Macho, MD 1/29/202512:58 PM

## 2023-04-06 DIAGNOSIS — H2513 Age-related nuclear cataract, bilateral: Secondary | ICD-10-CM | POA: Diagnosis not present

## 2023-04-06 DIAGNOSIS — H353133 Nonexudative age-related macular degeneration, bilateral, advanced atrophic without subfoveal involvement: Secondary | ICD-10-CM | POA: Diagnosis not present

## 2023-04-06 DIAGNOSIS — H43811 Vitreous degeneration, right eye: Secondary | ICD-10-CM | POA: Diagnosis not present

## 2023-04-06 DIAGNOSIS — H353211 Exudative age-related macular degeneration, right eye, with active choroidal neovascularization: Secondary | ICD-10-CM | POA: Diagnosis not present

## 2023-04-06 DIAGNOSIS — H35373 Puckering of macula, bilateral: Secondary | ICD-10-CM | POA: Diagnosis not present

## 2023-05-04 ENCOUNTER — Other Ambulatory Visit: Payer: Self-pay | Admitting: Physician Assistant

## 2023-06-02 DIAGNOSIS — H353211 Exudative age-related macular degeneration, right eye, with active choroidal neovascularization: Secondary | ICD-10-CM | POA: Diagnosis not present

## 2023-06-02 DIAGNOSIS — H2513 Age-related nuclear cataract, bilateral: Secondary | ICD-10-CM | POA: Diagnosis not present

## 2023-06-02 DIAGNOSIS — H353133 Nonexudative age-related macular degeneration, bilateral, advanced atrophic without subfoveal involvement: Secondary | ICD-10-CM | POA: Diagnosis not present

## 2023-06-02 DIAGNOSIS — H43811 Vitreous degeneration, right eye: Secondary | ICD-10-CM | POA: Diagnosis not present

## 2023-06-02 DIAGNOSIS — H35373 Puckering of macula, bilateral: Secondary | ICD-10-CM | POA: Diagnosis not present

## 2023-06-03 ENCOUNTER — Ambulatory Visit (HOSPITAL_COMMUNITY)
Admission: EM | Admit: 2023-06-03 | Discharge: 2023-06-03 | Disposition: A | Discharge status: Home / Self Care | Attending: Emergency Medicine | Admitting: Emergency Medicine

## 2023-06-03 ENCOUNTER — Encounter (HOSPITAL_COMMUNITY): Payer: Self-pay | Admitting: *Deleted

## 2023-06-03 ENCOUNTER — Other Ambulatory Visit: Payer: Self-pay

## 2023-06-03 DIAGNOSIS — S81811A Laceration without foreign body, right lower leg, initial encounter: Secondary | ICD-10-CM | POA: Diagnosis not present

## 2023-06-03 MED ORDER — CEPHALEXIN 500 MG PO CAPS: 500.0000 mg | ORAL_CAPSULE | Freq: Four times a day (QID) | ORAL | 0 refills | Status: AC

## 2023-06-03 MED ORDER — LIDOCAINE HCL (PF) 1 % IJ SOLN
5.0000 mL | Freq: Once | INTRAMUSCULAR | Status: AC
  Administered 2023-06-03: 5 mL via INTRADERMAL

## 2023-06-03 MED ORDER — LIDOCAINE HCL 2 % IJ SOLN
INTRAMUSCULAR | Status: AC
  Filled 2023-06-03: qty 20

## 2023-06-03 MED ORDER — LIDOCAINE-EPINEPHRINE 1 %-1:100000 IJ SOLN
INTRAMUSCULAR | Status: AC
  Filled 2023-06-03: qty 1

## 2023-06-03 NOTE — ED Provider Notes (Signed)
 [facility]    CSN: [id number] Arrival date & time: 06/03/23  1255      History   Chief Complaint Chief Complaint  Patient presents with   Abrasion    HPI Mitchell Parrish is a 68 y.o. male. Patient presents to the urgent care today for concerns of a right lower leg abrasion. He states that he scraped against a stone pot and sustained a tear to the right lateral leg. States he has had some bleeding present throughout the morning. He takes aspirin  but no blood thinners. UTD on Tdap.  HPI  Past Medical History:  Diagnosis Date   Acute diastolic CHF (congestive heart failure) (HCC) 05/29/2018   Arthritis    left hip   Atrial fibrillation (HCC)    Emphysema lung (HCC)    Hyperlipidemia    Hypertension    Spurious polycythemia 12/28/2022   Tubular adenoma of colon 11/2011    Patient Active Problem List   Diagnosis Date Noted   Spurious polycythemia 12/28/2022   Emphysema lung (HCC)    Senile purpura (HCC) 12/25/2019   OA (osteoarthritis) of knee 01/28/2019   Sinus bradycardia 01/01/2019   Chronic pain of right knee 12/14/2018   Actinic keratosis 12/14/2018   Nonischemic cardiomyopathy (HCC) 10/08/2018   Chronic diastolic heart failure (HCC) 07/31/2018   Atrial fibrillation (HCC) 06/25/2018   Snoring 06/21/2018   Acute diastolic CHF (congestive heart failure) (HCC) 05/29/2018   Chronic obstructive pulmonary disease (HCC)    Other emphysema (HCC)    Tobacco abuse    Atrial fibrillation with RVR (HCC) 05/25/2018   Atrial flutter with rapid ventricular response (HCC) 05/25/2018   BPH (benign prostatic hyperplasia) 03/24/2016   Mixed hyperlipidemia 10/30/2012   Hip pain, chronic 10/30/2012   Degeneration of cervical intervertebral disc 10/30/2012   Insomnia 10/30/2012   Numbness of left anterior thigh 03/15/2012   Seborrheic keratosis 09/27/2011   Hypertension 09/26/2011   Obesity (BMI 30-39.9) 09/26/2011   Gilbert's syndrome 09/26/2011   Nicotine   abuse 09/26/2011    Past Surgical History:  Procedure Laterality Date   ATRIAL FIBRILLATION ABLATION N/A 04/03/2019   Procedure: ATRIAL FIBRILLATION ABLATION;  Surgeon: Lei Pump, MD;  Location: [facility];  Service: Cardiovascular;  Laterality: N/A;   BACK SURGERY     CARDIOVERSION N/A 06/25/2018   Procedure: CARDIOVERSION (CATH LAB);  Surgeon: Tammie Fall, MD;  Location[facility] CV LAB;  Service: Cardiovascular;  Laterality: N/A;   HERNIA REPAIR     SPINE SURGERY     cervical fusion c6, c7 (1994), and c3,4,5,6 (2004)   TOTAL KNEE ARTHROPLASTY Right 01/28/2019   Procedure: TOTAL KNEE ARTHROPLASTY;  Surgeon: Liliane Rei, MD;  Location: [facility];  Service: Orthopedics;  Laterality: Right;   WISDOM TOOTH EXTRACTION         Home Medications    Prior to Admission medications   Medication Sig Start Date End Date Taking? Authorizing Provider  albuterol  (VENTOLIN  HFA) 108 (90 Base) MCG/ACT inhaler Inhale 2 puffs into the lungs every 4 (four) hours as needed for wheezing or shortness of breath. 01/19/23  Yes Jobe Mulder, DO  aspirin  EC 81 MG tablet Take 81 mg by mouth daily. Swallow whole.   Yes [provider]  Avacincaptad Pegol (IZERVAY IZ) Inject as directed.   Yes [provider]  cephALEXin  (KEFLEX ) 500 MG capsule Take 1 capsule (500 mg total) by mouth 4 (four) times daily for 7 days. 06/03/23 06/10/23 Yes Alyssah Algeo  A, PA-C  co-enzyme Q-10 30 MG capsule Take 200 mg by mouth daily.   Yes [provider]  Faricimab -svoa (VABYSMO  IZ) Inject as directed.   Yes [provider]  fluticasone  (FLONASE ) 50 MCG/ACT nasal spray Place 2 sprays into both nostrils as needed for allergies or rhinitis. 12/14/18  Yes Jobe Mulder, DO  furosemide  (LASIX ) 40 MG tablet TAKE 1 TABLET BY MOUTH DAILY AS NEEDED 05/04/23  Yes Ursuy, Renee Lynn, PA-C  losartan  (COZAAR ) 100 MG tablet Take 1 tablet (100 mg total) by mouth daily.  02/03/23  Yes Debbie Fails, PA-C  metoprolol  succinate (TOPROL -XL) 25 MG 24 hr tablet Take 1 tablet (25 mg total) by mouth 2 (two) times daily. 02/03/23  Yes Debbie Fails, PA-C  potassium chloride  (KLOR-CON ) 10 MEQ tablet TAKE 1 TABLET BY MOUTH DAILY AS NEEDED WITH LASIX  05/04/23  Yes Debbie Fails, PA-C  rosuvastatin  (CRESTOR ) 20 MG tablet Take 1 tablet (20 mg total) by mouth daily. 11/22/22  Yes Jobe Mulder, DO  SYMBICORT  160-4.5 MCG/ACT inhaler Inhale 1 puff into the lungs 2 (two) times daily. in the morning and at bedtime. 01/19/23  Yes Jobe Mulder, DO  umeclidinium bromide  (INCRUSE ELLIPTA ) 62.5 MCG/ACT AEPB Inhale 1 puff into the lungs daily. 01/19/23  Yes Wendling, [name], DO    Family History Family History  Problem Relation Age of Onset   Diabetes Mother    Lung cancer Mother 35   Diabetes Father    Heart disease Father    Colon cancer Father 34       rectal cancer    Social History Social History   Tobacco Use   Smoking status: Some Days    Current packs/day: 0.50    Average packs/day: 0.5 packs/day for 45.0 years (22.5 ttl pk-yrs)    Types: Cigarettes   Smokeless tobacco: Never  Vaping Use   Vaping status: Never Used  Substance Use Topics   Alcohol use: Yes    Alcohol/week: 1.0 - 2.0 standard drink of alcohol    Types: 1 - 2 Cans of beer per week    Comment: 2-3 drinks/ day   Drug use: No     Allergies   Lodine [etodolac]   Review of Systems Review of Systems  Skin:  Positive for wound.  All other systems reviewed and are negative.    Physical Exam Triage Vital Signs ED Triage Vitals  Encounter Vitals Group     BP 06/03/23 1430 (!) 172/88     Systolic BP Percentile --      Diastolic BP Percentile --      Pulse Rate 06/03/23 1430 73     Resp 06/03/23 1430 20     Temp 06/03/23 1430 98.8 F (37.1 C)     Temp src --      SpO2 06/03/23 1430 94 %     Weight --      Height --      Head Circumference --       Peak Flow --      Pain Score 06/03/23 1427 0     Pain Loc --      Pain Education --      Exclude from Growth Chart --    No data found.  Updated Vital Signs BP (!) 172/88   Pulse 73   Temp 98.8 F (37.1 C)   Resp 20   SpO2 94%   Visual Acuity Right Eye Distance:   Left  Eye Distance:   Bilateral Distance:    Right Eye Near:   Left Eye Near:    Bilateral Near:     Physical Exam Vitals and nursing note reviewed.  Constitutional:      General: He is not in acute distress.    Appearance: He is well-developed.  HENT:     Head: Normocephalic and atraumatic.  Eyes:     Conjunctiva/sclera: Conjunctivae normal.  Musculoskeletal:        General: No swelling.  Skin:    General: Skin is warm and dry.     Capillary Refill: Capillary refill takes less than 2 seconds.     Findings: Lesion present.          Comments: Approximately 12cm laceration to the lateral right leg at the anterior portion. Fat exposed, some bleeding present.  Neurological:     Mental Status: He is alert.  Psychiatric:        Mood and Affect: Mood normal.      UC Treatments / Results  Labs (all labs ordered are listed, but only abnormal results are displayed) Labs Reviewed - No data to display  EKG   Radiology No results found.  Procedures Laceration Repair  Date/Time: 06/03/2023 3:50 PM  Performed by: Ludy Messamore A, PA-C Authorized by: Kaleia Longhi A, PA-C   Consent:    Consent obtained:  Verbal   Consent given by:  Patient   Risks discussed:  Infection, pain and poor cosmetic result   Alternatives discussed:  No treatment and referral Universal protocol:    Patient identity confirmed:  Verbally with patient and arm band Anesthesia:    Anesthesia method:  Local infiltration   Local anesthetic:  Lidocaine  1% w/o epi and lidocaine  1% WITH epi Laceration details:    Location:  Leg   Leg location:  R lower leg   Length (cm):  12   Depth (mm):  3 Pre-procedure details:     Preparation:  Patient was prepped and draped in usual sterile fashion Exploration:    Hemostasis achieved with:  LET   Imaging outcome: foreign body not noted     Contaminated: no   Treatment:    Area cleansed with:  Saline   Amount of cleaning:  Extensive   Irrigation solution:  Sterile saline   Irrigation volume:  500   Irrigation method:  Pressure wash   Visualized foreign bodies/material removed: no   Skin repair:    Repair method:  Sutures   Suture size:  5-0   Suture material:  Prolene   Suture technique:  Simple interrupted and horizontal mattress   Number of sutures:  12 Approximation:    Approximation:  Close Repair type:    Repair type:  Intermediate Post-procedure details:    Dressing:  Sterile dressing   Procedure completion:  Tolerated  (including critical care time)  Medications Ordered in UC Medications  lidocaine  (PF) (XYLOCAINE ) 1 % injection 5 mL (5 mLs Intradermal Given by Other 06/03/23 1450)    Initial Impression / Assessment and Plan / UC Course  I have reviewed the triage vital signs and the nursing notes.  Pertinent labs & imaging results that were available during my care of the patient were reviewed by me and considered in my medical decision making (see chart for details).     Patient presents today for concerns of an abrasion.  Reports he bumped into a stone pond earlier today and noticed laceration.  Reports he is currently on aspirin   but denies any blood thinner use.  Attempted apply direct pressure to the area but still reports some bleeding.  Here for evaluation of the lesion.  Lesion was approximately 12 cm on the lateral right leg at the anterior portion.  There are some exposed fat layer underneath but no obvious muscle injury.  Minimal bleeding at this time.  Patient with intact sensation and movement of the lower extremity.  Will require suture repair.  Patient up-to-date on Tdap.  Suture repaired tolerated. Required additional lidocaine   with epinephrine  due to bleeding. Total of 12 sutures placed including 9 simple and 3 horizontal mattress sutures. Advised patient to have wound reassessed for possible suture removal at 7-10 days. Started on Keflex  for wound infection prevention given history of total knee replacement in the right leg and prior issues with poor wound healing from similar injury last year. Patient agreeable with current plan and verbalized understanding return precautions. Discharged home in stable condition. Final Clinical Impressions(s) / UC Diagnoses   Final diagnoses:  Laceration of right lower extremity, initial encounter     Discharge Instructions      You were seen today for concerns of a laceration to the right leg. This was cleaned and repaired here today for you. These sutures should remain in place for the next 7-10 days and be rechecked before removal. I have started you on Keflex  to prevent infection in this area. For any concerns of new or worsening symptoms or signs of infection, return for evaluation.     ED Prescriptions     Medication Sig Dispense Auth. Provider   cephALEXin  (KEFLEX ) 500 MG capsule Take 1 capsule (500 mg total) by mouth 4 (four) times daily for 7 days. 28 capsule Maddox Hlavaty A, PA-C      PDMP not reviewed this encounter.   Jamile Sivils A, PA-C 06/03/23 [phone]

## 2023-06-03 NOTE — Discharge Instructions (Signed)
 You were seen today for concerns of a laceration to the right leg. This was cleaned and repaired here today for you. These sutures should remain in place for the next 7-10 days and be rechecked before removal. I have started you on Keflex  to prevent infection in this area. For any concerns of new or worsening symptoms or signs of infection, return for evaluation.

## 2023-06-03 NOTE — ED Triage Notes (Signed)
 PT reports thjis morning her rubbed his Rt leg against a stone pot and he has a wound on Rt lateral leg. Pt reports bleeding to site.

## 2023-06-06 ENCOUNTER — Telehealth: Payer: Self-pay

## 2023-06-06 NOTE — Telephone Encounter (Signed)
 Copied from CRM [id number]. Topic: General - Other >> Jun 06, 2023  8:31 AM Mitchell Parrish wrote: Reason for CRM: Patient would like to have Dr. Gwenette Lennox or his nurse to give him a call regarding his leg lesion. Please call [phone]

## 2023-06-13 ENCOUNTER — Ambulatory Visit (INDEPENDENT_AMBULATORY_CARE_PROVIDER_SITE_OTHER): Admitting: Family Medicine

## 2023-06-13 ENCOUNTER — Encounter: Payer: Self-pay | Admitting: Family Medicine

## 2023-06-13 VITALS — BP 156/57 | HR 76 | Temp 98.0°F | Resp 16 | Ht 71.0 in | Wt 258.0 lb

## 2023-06-13 DIAGNOSIS — S81801A Unspecified open wound, right lower leg, initial encounter: Secondary | ICD-10-CM | POA: Diagnosis not present

## 2023-06-13 DIAGNOSIS — Z1211 Encounter for screening for malignant neoplasm of colon: Secondary | ICD-10-CM

## 2023-06-13 DIAGNOSIS — M79604 Pain in right leg: Secondary | ICD-10-CM

## 2023-06-13 MED ORDER — TRAMADOL HCL 50 MG PO TABS: 50.0000 mg | ORAL_TABLET | Freq: Three times a day (TID) | ORAL | 0 refills | Status: AC | PRN

## 2023-06-13 NOTE — Progress Notes (Signed)
 Chief Complaint  Patient presents with   Suture / Staple Removal    Suture Removal     Mitchell Parrish is a 68 y.o. male here for a skin complaint.  Duration: 10 days Location: RLE Pruritic? No Painful? Yes Drainage? No Trauma? Yes Other associated symptoms: oozing; no fevers, redness Therapies tried thus far: Keflex   Past Medical History:  Diagnosis Date   Acute diastolic CHF (congestive heart failure) (HCC) 05/29/2018   Arthritis    left hip   Atrial fibrillation (HCC)    Emphysema lung (HCC)    Hyperlipidemia    Hypertension    Spurious polycythemia 12/28/2022   Tubular adenoma of colon 11/2011    BP (!) 156/57 (BP Location: Left Arm, Patient Position: Sitting)   Pulse 76   Temp 98 F (36.7 C) (Oral)   Resp 16   Ht 5\' 11"  (1.803 m)   Wt 258 lb (117 kg)   SpO2 96%   BMI 35.98 kg/m  Gen: awake, alert, appearing stated age Lungs: No accessory muscle use Skin: See below. No drainage, excessive warmth, TTP, fluctuance, excoriation Psych: Age appropriate judgment and insight    Wound of right lower extremity, initial encounter  Pain of right lower extremity - Plan: traMADol  (ULTRAM ) 50 MG tablet  Screen for colon cancer - Plan: Ambulatory referral to Gastroenterology  12 simple sutures removed today.  TAO and dressing placed afterwards.  Tramadol  to use as needed for pain.  I do not think he needs a wound care referral right now.  No signs of infection. F/u prn. The patient voiced understanding and agreement to the plan.  [name] [place], DO 06/13/23 12:07 PM

## 2023-06-28 ENCOUNTER — Encounter (HOSPITAL_BASED_OUTPATIENT_CLINIC_OR_DEPARTMENT_OTHER): Attending: Internal Medicine | Admitting: Internal Medicine

## 2023-06-28 DIAGNOSIS — Z720 Tobacco use: Secondary | ICD-10-CM | POA: Insufficient documentation

## 2023-06-28 DIAGNOSIS — L97812 Non-pressure chronic ulcer of other part 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: Secondary | ICD-10-CM | POA: Diagnosis not present

## 2023-06-28 DIAGNOSIS — J449 Chronic obstructive pulmonary disease, unspecified: Secondary | ICD-10-CM | POA: Diagnosis not present

## 2023-06-28 DIAGNOSIS — I5032 Chronic diastolic (congestive) heart failure: Secondary | ICD-10-CM | POA: Insufficient documentation

## 2023-06-28 DIAGNOSIS — T798XXA Other early complications of trauma, initial encounter: Secondary | ICD-10-CM | POA: Diagnosis not present

## 2023-06-28 DIAGNOSIS — I87311 Chronic venous hypertension (idiopathic) with ulcer of right lower extremity: Secondary | ICD-10-CM | POA: Diagnosis not present

## 2023-06-28 DIAGNOSIS — X58XXXA Exposure to other specified factors, initial encounter: Secondary | ICD-10-CM | POA: Insufficient documentation

## 2023-07-05 ENCOUNTER — Inpatient Hospital Stay: Payer: PPO | Attending: Hematology & Oncology

## 2023-07-05 ENCOUNTER — Encounter: Payer: Self-pay | Admitting: Hematology & Oncology

## 2023-07-05 ENCOUNTER — Encounter (HOSPITAL_BASED_OUTPATIENT_CLINIC_OR_DEPARTMENT_OTHER): Admitting: Internal Medicine

## 2023-07-05 ENCOUNTER — Other Ambulatory Visit: Payer: Self-pay

## 2023-07-05 ENCOUNTER — Inpatient Hospital Stay: Payer: PPO | Admitting: Hematology & Oncology

## 2023-07-05 VITALS — BP 154/66 | HR 69 | Temp 98.4°F | Resp 19 | Ht 71.0 in | Wt 255.0 lb

## 2023-07-05 DIAGNOSIS — T798XXA Other early complications of trauma, initial encounter: Secondary | ICD-10-CM

## 2023-07-05 DIAGNOSIS — D751 Secondary polycythemia: Secondary | ICD-10-CM | POA: Diagnosis present

## 2023-07-05 DIAGNOSIS — W2209XA Striking against other stationary object, initial encounter: Secondary | ICD-10-CM | POA: Diagnosis not present

## 2023-07-05 DIAGNOSIS — E538 Deficiency of other specified B group vitamins: Secondary | ICD-10-CM

## 2023-07-05 DIAGNOSIS — Z7982 Long term (current) use of aspirin: Secondary | ICD-10-CM | POA: Insufficient documentation

## 2023-07-05 DIAGNOSIS — I87311 Chronic venous hypertension (idiopathic) with ulcer of right lower extremity: Secondary | ICD-10-CM

## 2023-07-05 DIAGNOSIS — Z79899 Other long term (current) drug therapy: Secondary | ICD-10-CM | POA: Insufficient documentation

## 2023-07-05 DIAGNOSIS — Z7951 Long term (current) use of inhaled steroids: Secondary | ICD-10-CM | POA: Diagnosis not present

## 2023-07-05 DIAGNOSIS — L97812 Non-pressure chronic ulcer of other part 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: Secondary | ICD-10-CM

## 2023-07-05 HISTORY — DX: Deficiency of other specified B group vitamins: E53.8

## 2023-07-05 LAB — IRON AND IRON BINDING CAPACITY (CC-WL,HP ONLY)
Iron: 91 ug/dL (ref 45–182)
Saturation Ratios: 22 % (ref 17.9–39.5)
TIBC: 409 ug/dL (ref 250–450)
UIBC: 318 ug/dL (ref 117–376)

## 2023-07-05 LAB — CBC WITH DIFFERENTIAL (CANCER CENTER ONLY)
Abs Immature Granulocytes: 0.11 10*3/uL — ABNORMAL HIGH (ref 0.00–0.07)
Basophils Absolute: 0.1 10*3/uL (ref 0.0–0.1)
Basophils Relative: 1 %
Eosinophils Absolute: 0.3 10*3/uL (ref 0.0–0.5)
Eosinophils Relative: 3 %
HCT: 45.5 % (ref 39.0–52.0)
Hemoglobin: 15 g/dL (ref 13.0–17.0)
Immature Granulocytes: 1 %
Lymphocytes Relative: 23 %
Lymphs Abs: 2.6 10*3/uL (ref 0.7–4.0)
MCH: 31.3 pg (ref 26.0–34.0)
MCHC: 33 g/dL (ref 30.0–36.0)
MCV: 94.8 fL (ref 80.0–100.0)
Monocytes Absolute: 0.8 10*3/uL (ref 0.1–1.0)
Monocytes Relative: 8 %
Neutro Abs: 7.1 10*3/uL (ref 1.7–7.7)
Neutrophils Relative %: 64 %
Platelet Count: 238 10*3/uL (ref 150–400)
RBC: 4.8 MIL/uL (ref 4.22–5.81)
RDW: 13.6 % (ref 11.5–15.5)
WBC Count: 11 10*3/uL — ABNORMAL HIGH (ref 4.0–10.5)
nRBC: 0 % (ref 0.0–0.2)

## 2023-07-05 LAB — CMP (CANCER CENTER ONLY)
ALT: 20 U/L (ref 0–44)
AST: 17 U/L (ref 15–41)
Albumin: 4.9 g/dL (ref 3.5–5.0)
Alkaline Phosphatase: 85 U/L (ref 38–126)
Anion gap: 8 (ref 5–15)
BUN: 12 mg/dL (ref 8–23)
CO2: 29 mmol/L (ref 22–32)
Calcium: 9.3 mg/dL (ref 8.9–10.3)
Chloride: 102 mmol/L (ref 98–111)
Creatinine: 0.96 mg/dL (ref 0.61–1.24)
GFR, Estimated: >60 mL/min (ref >60)
Glucose, Bld: 102 mg/dL — ABNORMAL HIGH (ref 70–99)
Potassium: 4.2 mmol/L (ref 3.5–5.1)
Sodium: 139 mmol/L (ref 135–145)
Total Bilirubin: 1.3 mg/dL — ABNORMAL HIGH (ref 0.0–1.2)
Total Protein: 7.2 g/dL (ref 6.5–8.1)

## 2023-07-05 LAB — RETICULOCYTES
Immature Retic Fract: 18.5 % — ABNORMAL HIGH (ref 2.3–15.9)
RBC.: 4.83 MIL/uL (ref 4.22–5.81)
Retic Count, Absolute: 109.2 10*3/uL (ref 19.0–186.0)
Retic Ct Pct: 2.3 % (ref 0.4–3.1)

## 2023-07-05 LAB — FERRITIN: Ferritin: 176 ng/mL (ref 24–336)

## 2023-07-05 LAB — VITAMIN B12: Vitamin B-12: 674 pg/mL (ref 180–914)

## 2023-07-05 NOTE — Progress Notes (Signed)
 Hematology and Oncology Follow Up Visit  Mitchell Parrish [id number] November 16, 1955 68 y.o. 07/05/2023   Principle Diagnosis:  Spurious polycythemia Vitamin B12 deficiency  Current Therapy:   Patient is donating blood to the [facility] EC ASA 81 mg p.o. daily Vitamin B12 5000 mcg orally daily     Interim History:  Mitchell Parrish is back for follow-up.  The big news with him is that he has a wound on the pretibial region of his left lower leg.  Apparently, back in the winter, he also had a wound in the exact same area.  This is when a branch fell on his leg.  He required numerous sutures.  Thankfully, the wound did not get infected.  Recently, he ran into a concrete vase.  He tore the skin off.  This is dressed.  [facility]  He is donating blood to the [employer].  His hemoglobin has been coming down nicely.  He has had no problems with bleeding.  There is no headache.  Has had no change in bowel or bladder habits.  He is taking over-the-counter vitamin B12 for his low vitamin B12 level.  Overall, I would have said that his performance status for now is ECOG 1.  Medications:  Current Outpatient Medications:    albuterol  (VENTOLIN  HFA) 108 (90 Base) MCG/ACT inhaler, Inhale 2 puffs into the lungs every 4 (four) hours as needed for wheezing or shortness of breath., Disp: 18 g, Rfl: 5   aspirin  EC 81 MG tablet, Take 81 mg by mouth daily. Swallow whole., Disp: , Rfl:    Avacincaptad Pegol (IZERVAY IZ), Inject as directed., Disp: , Rfl:    co-enzyme Q-10 30 MG capsule, Take 200 mg by mouth daily., Disp: , Rfl:    Faricimab -svoa (VABYSMO  IZ), Inject as directed., Disp: , Rfl:    fluticasone  (FLONASE ) 50 MCG/ACT nasal spray, Place 2 sprays into both nostrils as needed for allergies or rhinitis., Disp: 16 g, Rfl: 5   furosemide  (LASIX ) 40 MG tablet, TAKE 1 TABLET BY MOUTH DAILY AS NEEDED, Disp: 90 tablet, Rfl: 2   losartan  (COZAAR ) 100 MG tablet, Take 1 tablet (100 mg  total) by mouth daily., Disp: 90 tablet, Rfl: 3   metoprolol  succinate (TOPROL -XL) 25 MG 24 hr tablet, Take 1 tablet (25 mg total) by mouth 2 (two) times daily., Disp: 180 tablet, Rfl: 3   potassium chloride  (KLOR-CON ) 10 MEQ tablet, TAKE 1 TABLET BY MOUTH DAILY AS NEEDED WITH LASIX , Disp: 90 tablet, Rfl: 2   rosuvastatin  (CRESTOR ) 20 MG tablet, Take 1 tablet (20 mg total) by mouth daily., Disp: 90 tablet, Rfl: 3   SYMBICORT  160-4.5 MCG/ACT inhaler, Inhale 1 puff into the lungs 2 (two) times daily. in the morning and at bedtime., Disp: 10.2 g, Rfl: 5   umeclidinium bromide  (INCRUSE ELLIPTA ) 62.5 MCG/ACT AEPB, Inhale 1 puff into the lungs daily., Disp: 30 each, Rfl: 5  Allergies:  Allergies  Allergen Reactions   Lodine [Etodolac] Hives    Past Medical History, Surgical history, Social history, and Family History were reviewed and updated.  Review of Systems: Review of Systems  Constitutional: Negative.   HENT:  Negative.    Eyes: Negative.   Respiratory: Negative.    Cardiovascular: Negative.   Gastrointestinal: Negative.   Endocrine: Negative.   Genitourinary: Negative.    Musculoskeletal: Negative.   Skin: Negative.   Neurological: Negative.   Hematological: Negative.   Psychiatric/Behavioral: Negative.  Physical Exam:  height is 5\' 11"  (1.803 m) and weight is 255 lb (115.7 kg). His oral temperature is 98.4 F (36.9 C). His blood pressure is 154/66 (abnormal) and his pulse is 69. His respiration is 19 and oxygen saturation is 99%.   Wt Readings from Last 3 Encounters:  07/05/23 255 lb (115.7 kg)  06/13/23 258 lb (117 kg)  03/08/23 253 lb 1.3 oz (114.8 kg)    Physical Exam Vitals reviewed.  HENT:     Head: Normocephalic and atraumatic.  Eyes:     Pupils: Pupils are equal, round, and reactive to light.  Cardiovascular:     Rate and Rhythm: Normal rate and regular rhythm.     Heart sounds: Normal heart sounds.  Pulmonary:     Effort: Pulmonary effort is normal.      Breath sounds: Normal breath sounds.  Abdominal:     General: Bowel sounds are normal.     Palpations: Abdomen is soft.  Musculoskeletal:        General: No tenderness or deformity. Normal range of motion.     Cervical back: Normal range of motion.  Lymphadenopathy:     Cervical: No cervical adenopathy.  Skin:    General: Skin is warm and dry.     Findings: No erythema or rash.  Neurological:     Mental Status: He is alert and oriented to person, place, and time.  Psychiatric:        Behavior: Behavior normal.        Thought Content: Thought content normal.        Judgment: Judgment normal.      Lab Results  Component Value Date   WBC 11.0 (H) 07/05/2023   HGB 15.0 07/05/2023   HCT 45.5 07/05/2023   MCV 94.8 07/05/2023   PLT 238 07/05/2023     Chemistry      Component Value Date/Time   NA 139 07/05/2023 1206   NA 140 03/18/2019 0857   K 4.2 07/05/2023 1206   CL 102 07/05/2023 1206   CO2 29 07/05/2023 1206   BUN 12 07/05/2023 1206   BUN 10 03/18/2019 0857   CREATININE 0.96 07/05/2023 1206   CREATININE 0.97 10/28/2022 1518      Component Value Date/Time   CALCIUM  9.3 07/05/2023 1206   ALKPHOS 85 07/05/2023 1206   AST 17 07/05/2023 1206   ALT 20 07/05/2023 1206   BILITOT 1.3 (H) 07/05/2023 1206      Impression and Plan: Mitchell Parrish is a very nice 68 year old white male.  He has polycythemia.  Again this is spurious or secondary polycythemia.  He does not have a JAK2 mutation.  He is responding well to his donations.  Again he can donate every 4 months for my opinion.  I would like to see him back in the Fall.  By then, he should have healed up his wound on the left lower leg.  We are checking his vitamin B12 level.   Ivor Mars, MD 5/28/20251:37 PM

## 2023-07-06 ENCOUNTER — Other Ambulatory Visit: Payer: Self-pay | Admitting: Family Medicine

## 2023-07-06 DIAGNOSIS — J42 Unspecified chronic bronchitis: Secondary | ICD-10-CM

## 2023-07-12 ENCOUNTER — Encounter (HOSPITAL_BASED_OUTPATIENT_CLINIC_OR_DEPARTMENT_OTHER): Attending: Internal Medicine | Admitting: Internal Medicine

## 2023-07-12 DIAGNOSIS — J449 Chronic obstructive pulmonary disease, unspecified: Secondary | ICD-10-CM | POA: Insufficient documentation

## 2023-07-12 DIAGNOSIS — I5032 Chronic diastolic (congestive) heart failure: Secondary | ICD-10-CM | POA: Diagnosis not present

## 2023-07-12 DIAGNOSIS — X58XXXA Exposure to other specified factors, initial encounter: Secondary | ICD-10-CM | POA: Diagnosis not present

## 2023-07-12 DIAGNOSIS — Z720 Tobacco use: Secondary | ICD-10-CM | POA: Diagnosis not present

## 2023-07-12 DIAGNOSIS — L97812 Non-pressure chronic ulcer of other part 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: Secondary | ICD-10-CM | POA: Diagnosis not present

## 2023-07-12 DIAGNOSIS — T8131XA Disruption of external operation (surgical) wound, not elsewhere classified, initial encounter: Secondary | ICD-10-CM | POA: Diagnosis not present

## 2023-07-12 DIAGNOSIS — I87311 Chronic venous hypertension (idiopathic) with ulcer of right lower extremity: Secondary | ICD-10-CM | POA: Diagnosis not present

## 2023-07-12 DIAGNOSIS — T798XXA Other early complications of trauma, initial encounter: Secondary | ICD-10-CM | POA: Insufficient documentation

## 2023-07-19 ENCOUNTER — Encounter (HOSPITAL_BASED_OUTPATIENT_CLINIC_OR_DEPARTMENT_OTHER): Admitting: Internal Medicine

## 2023-07-19 DIAGNOSIS — I5032 Chronic diastolic (congestive) heart failure: Secondary | ICD-10-CM

## 2023-07-19 DIAGNOSIS — T798XXA Other early complications of trauma, initial encounter: Secondary | ICD-10-CM

## 2023-07-19 DIAGNOSIS — I87311 Chronic venous hypertension (idiopathic) with ulcer of right lower extremity: Secondary | ICD-10-CM | POA: Diagnosis not present

## 2023-07-19 DIAGNOSIS — L97812 Non-pressure chronic ulcer of other part 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: Secondary | ICD-10-CM | POA: Diagnosis not present

## 2023-07-26 ENCOUNTER — Encounter (HOSPITAL_BASED_OUTPATIENT_CLINIC_OR_DEPARTMENT_OTHER): Admitting: Internal Medicine

## 2023-07-26 DIAGNOSIS — I87311 Chronic venous hypertension (idiopathic) with ulcer of right lower extremity: Secondary | ICD-10-CM

## 2023-07-26 DIAGNOSIS — I5032 Chronic diastolic (congestive) heart failure: Secondary | ICD-10-CM

## 2023-07-26 DIAGNOSIS — T798XXA Other early complications of trauma, initial encounter: Secondary | ICD-10-CM

## 2023-07-26 DIAGNOSIS — L97812 Non-pressure chronic ulcer of other part 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: Secondary | ICD-10-CM | POA: Diagnosis not present

## 2023-08-02 ENCOUNTER — Ambulatory Visit

## 2023-08-02 ENCOUNTER — Encounter (HOSPITAL_BASED_OUTPATIENT_CLINIC_OR_DEPARTMENT_OTHER): Admitting: Internal Medicine

## 2023-08-02 VITALS — BP 154/66 | Ht 71.0 in | Wt 249.0 lb

## 2023-08-02 DIAGNOSIS — I5032 Chronic diastolic (congestive) heart failure: Secondary | ICD-10-CM | POA: Diagnosis not present

## 2023-08-02 DIAGNOSIS — L97812 Non-pressure chronic ulcer of other part 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: Secondary | ICD-10-CM

## 2023-08-02 DIAGNOSIS — Z0000 Encounter for general adult medical examination without abnormal findings: Secondary | ICD-10-CM | POA: Diagnosis not present

## 2023-08-02 DIAGNOSIS — T798XXA Other early complications of trauma, initial encounter: Secondary | ICD-10-CM

## 2023-08-02 DIAGNOSIS — I87311 Chronic venous hypertension (idiopathic) with ulcer of right lower extremity: Secondary | ICD-10-CM | POA: Diagnosis not present

## 2023-08-02 NOTE — Patient Instructions (Signed)
 Mr. Winterton , Thank you for taking time out of your busy schedule to complete your Annual Wellness Visit with me. I enjoyed our conversation and look forward to speaking with you again next year. I, as well as your care team,  appreciate your ongoing commitment to your health goals. Please review the following plan we discussed and let me know if I can assist you in the future. Your Game plan/ To Do List    Referrals: If you haven't heard from the office you've been referred to, please reach out to them at the phone provided.  none Follow up Visits: Next Medicare AWV with our clinical staff: 06/30/(2026   Have you seen your provider in the last 6 months (3 months if uncontrolled diabetes)? Yes Next Office Visit with your provider: n/a  Clinician Recommendations:  Aim for 30 minutes of exercise or brisk walking, 6-8 glasses of water , and 5 servings of fruits and vegetables each day.       This is a list of the screening recommended for you and due dates:  Health Maintenance  Topic Date Due   Screening for Lung Cancer  12/29/2020   Colon Cancer Screening  11/13/2021   COVID-19 Vaccine (5 - 2024-25 season) 10/09/2022   Flu Shot  09/08/2023   Medicare Annual Wellness Visit  08/01/2024   DTaP/Tdap/Td vaccine (2 - Td or Tdap) 12/13/2028   Pneumococcal Vaccine for age over 57  Completed   Hepatitis C Screening  Completed   Zoster (Shingles) Vaccine  Completed   Hepatitis B Vaccine  Aged Out   HPV Vaccine  Aged Out   Meningitis B Vaccine  Aged Out    Advanced directives: (Copy Requested) Please bring a copy of your health care power of attorney and living will to the office to be added to your chart at your convenience. You can mail to [facility] [address] [place], [place] [address] or email to [email or web address] Advance Care Planning is important because it:  [x]  Makes sure you receive the medical care that is consistent with your values, goals, and  preferences  [x]  It provides guidance to your family and loved ones and reduces their decisional burden about whether or not they are making the right decisions based on your wishes.  Follow the link provided in your after visit summary or read over the paperwork we have mailed to you to help you started getting your Advance Directives in place. If you need assistance in completing these, please reach out to us  so that we can help you!  See attachments for Preventive Care and Fall Prevention Tips.

## 2023-08-02 NOTE — Progress Notes (Signed)
 Because this visit was a virtual/telehealth visit,  certain criteria was not obtained, such a blood pressure, CBG if applicable, and timed get up and go. Any medications not marked as taking were not mentioned during the medication reconciliation part of the visit. Any vitals not documented were not able to be obtained due to this being a telehealth visit or patient was unable to self-report a recent blood pressure reading due to a lack of equipment at home via telehealth. Vitals that have been documented are verbally provided by the patient.   This visit was performed by a medical professional under my direct supervision. I was immediately available for consultation/collaboration. I have reviewed and agree with the Annual Wellness Visit documentation.  Subjective:   Mitchell Parrish is a 68 y.o. who presents for a Medicare Wellness preventive visit.  As a reminder, Annual Wellness Visits don't include a physical exam, and some assessments may be limited, especially if this visit is performed virtually. We may recommend an in-person follow-up visit with your provider if needed.  Visit Complete: Virtual I connected with  Mitchell Parrish on 08/02/23 by a audio enabled telemedicine application and verified that I am speaking with the correct person using two identifiers.  Patient Location: Home  Provider Location: Home Office  I discussed the limitations of evaluation and management by telemedicine. The patient expressed understanding and agreed to proceed.  Vital Signs: Because this visit was a virtual/telehealth visit, some criteria may be missing or patient reported. Any vitals not documented were not able to be obtained and vitals that have been documented are patient reported.  VideoDeclined- This patient declined Librarian, academic. Therefore the visit was completed with audio only.  Persons Participating in Visit: Patient.  AWV Questionnaire: No: Patient  Medicare AWV questionnaire was not completed prior to this visit.  Cardiac Risk Factors include: advanced age (>14men, >43 women);male gender;obesity (BMI >30kg/m2);smoking/ tobacco exposure;hypertension     Objective:    Today's Vitals   08/02/23 1300  BP: (!) 154/66  Weight: 249 lb (112.9 kg)  Height: 5' 11 (1.803 m)   Body mass index is 34.73 kg/m.     08/02/2023    1:03 PM 07/05/2023   12:53 PM 03/08/2023   12:22 PM 12/28/2022    2:08 PM 07/27/2022    3:38 PM 05/16/2022   12:45 PM 05/03/2022    5:50 PM  Advanced Directives  Does Patient Have a Medical Advance Directive? No No No No No No No  Would patient like information on creating a medical advance directive? No - Patient declined No - Patient declined No - Patient declined No - Patient declined No - Patient declined No - Patient declined No - Patient declined    Current Medications (verified) Outpatient Encounter Medications as of 08/02/2023  Medication Sig   albuterol  (VENTOLIN  HFA) 108 (90 Base) MCG/ACT inhaler Inhale 2 puffs into the lungs every 4 (four) hours as needed for wheezing or shortness of breath.   aspirin  EC 81 MG tablet Take 81 mg by mouth daily. Swallow whole.   Avacincaptad Pegol (IZERVAY IZ) Inject as directed.   co-enzyme Q-10 30 MG capsule Take 200 mg by mouth daily.   Faricimab -svoa (VABYSMO  IZ) Inject as directed.   fluticasone  (FLONASE ) 50 MCG/ACT nasal spray Place 2 sprays into both nostrils as needed for allergies or rhinitis.   furosemide  (LASIX ) 40 MG tablet TAKE 1 TABLET BY MOUTH DAILY AS NEEDED   INCRUSE ELLIPTA  62.5 MCG/ACT AEPB INHALE  1 PUFF INTO THE LUNGS DAILY   losartan  (COZAAR ) 100 MG tablet Take 1 tablet (100 mg total) by mouth daily.   metoprolol  succinate (TOPROL -XL) 25 MG 24 hr tablet Take 1 tablet (25 mg total) by mouth 2 (two) times daily.   potassium chloride  (KLOR-CON ) 10 MEQ tablet TAKE 1 TABLET BY MOUTH DAILY AS NEEDED WITH LASIX    rosuvastatin  (CRESTOR ) 20 MG tablet Take 1  tablet (20 mg total) by mouth daily.   SYMBICORT  160-4.5 MCG/ACT inhaler Inhale 1 puff into the lungs 2 (two) times daily.   No facility-administered encounter medications on file as of 08/02/2023.    Allergies (verified) Lodine [etodolac]   History: Past Medical History:  Diagnosis Date   Acute diastolic CHF (congestive heart failure) (HCC) 05/29/2018   Arthritis    left hip   Atrial fibrillation (HCC)    Emphysema lung (HCC)    Hyperlipidemia    Hypertension    Spurious polycythemia 12/28/2022   Tubular adenoma of colon 11/2011   Vitamin B12 deficiency (non anemic) 07/05/2023   Past Surgical History:  Procedure Laterality Date   ATRIAL FIBRILLATION ABLATION N/A 04/03/2019   Procedure: ATRIAL FIBRILLATION ABLATION;  Surgeon: Inocencio Soyla Lunger, MD;  Location: [facility];  Service: Cardiovascular;  Laterality: N/A;   BACK SURGERY     CARDIOVERSION N/A 06/25/2018   Procedure: CARDIOVERSION (CATH LAB);  Surgeon: Waddell Danelle ORN, MD;  Location: [facility] INVAS[facility] CV LAB;  Service: Cardiovascular;  Laterality: N/A;   HERNIA REPAIR     SPINE SURGERY     cervical fusion c6, c7 (1994), and c3,4,5,6 (2004)   TOTAL KNEE ARTHROPLASTY Right 01/28/2019   Procedure: TOTAL KNEE ARTHROPLASTY;  Surgeon: Melodi Lerner, MD;  Location: [facility];  Service: Orthopedics;  Laterality: Right;   WISDOM TOOTH EXTRACTION     Family History  Problem Relation Age of Onset   Diabetes Mother    Lung cancer Mother 18   Diabetes Father    Heart disease Father    Colon cancer Father 39       rectal cancer   Social History   Socioeconomic History   Marital status: Married    Spouse name: Not on file   Number of children: 4   Years of education: Not on file   Highest education level: 12th grade  Occupational History   Occupation: Environmental education officer: [employer]  Tobacco Use   Smoking status: Some Days    Current packs/day: 0.50    Average packs/day: 0.5 packs/day for 45.0 years (22.5 ttl  pk-yrs)    Types: Cigarettes   Smokeless tobacco: Never  Vaping Use   Vaping status: Never Used  Substance and Sexual Activity   Alcohol use: Yes    Alcohol/week: 1.0 - 2.0 standard drink of alcohol    Types: 1 - 2 Cans of beer per week    Comment: 2-3 drinks/ day   Drug use: No   Sexual activity: Yes  Other Topics Concern   Not on file  Social History Narrative   Not on file   Social Drivers of Health   Financial Resource Strain: Low Risk  (07/27/2022)   Overall Financial Resource Strain (CARDIA)    Difficulty of Paying Living Expenses: Not hard at all  Food Insecurity: No Food Insecurity (08/02/2023)   Hunger Vital Sign    Worried About Running Out of Food in the Last Year: Never true    Ran Out of Food in the Last  Year: Never true  Transportation Needs: No Transportation Needs (08/02/2023)   PRAPARE - Administrator, Civil Service (Medical): No    Lack of Transportation (Non-Medical): No  Physical Activity: Insufficiently Active (08/02/2023)   Exercise Vital Sign    Days of Exercise per Week: 5 days    Minutes of Exercise per Session: 20 min  Stress: No Stress Concern Present (08/02/2023)   [employer] of Occupational Health - Occupational Stress Questionnaire    Feeling of Stress: Not at all  Social Connections: Unknown (08/02/2023)   Social Connection and Isolation Panel    Frequency of Communication with Friends and Family: Patient declined    Frequency of Social Gatherings with Friends and Family: Patient declined    Attends Religious Services: Patient declined    Database administrator or Organizations: Patient declined    Attends Engineer, structural: Not on file    Marital Status: Married    Tobacco Counseling Ready to quit: Not Answered Counseling given: Not Answered    Clinical Intake:  Pre-visit preparation completed: Yes  Pain : No/denies pain     BMI - recorded: 34.73 Nutritional Status: BMI > 30  Obese Nutritional  Risks: None Diabetes: No  Lab Results  Component Value Date   HGBA1C 5.9 03/24/2016     How often do you need to have someone help you when you read instructions, pamphlets, or other written materials from your doctor or pharmacy?: 1 - Never  Interpreter Needed?: No  Information entered by :: Lyle Anderson LATHER   Activities of Daily Living     08/02/2023    1:03 PM  In your present state of health, do you have any difficulty performing the following activities:  Hearing? 0  Vision? 0  Difficulty concentrating or making decisions? 0  Walking or climbing stairs? 0  Dressing or bathing? 0  Doing errands, shopping? 0  Preparing Food and eating ? N  Using the Toilet? N  In the past six months, have you accidently leaked urine? N  Do you have problems with loss of bowel control? N  Managing your Medications? N  Managing your Finances? N  Housekeeping or managing your Housekeeping? N    Patient Care Team: Frann Mabel Mt, DO as PCP - General (Family Medicine) Inocencio Soyla Lunger, MD as PCP - Electrophysiology (Cardiology)  I have updated your Care Teams any recent Medical Services you may have received from other providers in the past year.     Assessment:   This is a routine wellness examination for Mitchell Parrish.  Hearing/Vision screen Hearing Screening - Comments:: Patient declined difficulties  Vision Screening - Comments:: Patient does not have any difficulties   Goals Addressed             This Visit's Progress    Patient Stated       Patient would like to retire       Depression Screen     08/02/2023    1:04 PM 12/28/2022    2:22 PM 10/28/2022    2:49 PM 07/27/2022    3:43 PM 05/20/2022    8:45 AM 06/23/2020    2:33 PM 12/25/2019    2:35 PM  PHQ 2/9 Scores  PHQ - 2 Score 0 0 0 0 0 0 0  PHQ- 9 Score 0  0        Fall Risk     08/02/2023    1:03 PM 10/28/2022    2:49 PM 07/27/2022  3:48 PM 06/23/2020    2:33 PM  Fall Risk   Falls in the  past year? 0 0 0 0  Number falls in past yr: 0 0 0 0  Injury with Fall? 0 0 0 0  Risk for fall due to : No Fall Risks No Fall Risks No Fall Risks   Follow up Falls evaluation completed Falls evaluation completed Falls evaluation completed Falls evaluation completed      Data saved with a previous flowsheet row definition    MEDICARE RISK AT HOME:  Medicare Risk at Home Any stairs in or around the home?: Yes If so, are there any without handrails?: No Home free of loose throw rugs in walkways, pet beds, electrical cords, etc?: Yes Adequate lighting in your home to reduce risk of falls?: Yes Life alert?: No Use of a cane, walker or w/c?: No Grab bars in the bathroom?: Yes Shower chair or bench in shower?: Yes Elevated toilet seat or a handicapped toilet?: Yes  TIMED UP AND GO:  Was the test performed?  No  Cognitive Function: 6CIT completed        08/02/2023    1:02 PM 07/27/2022    3:49 PM  6CIT Screen  What Year? 0 points 0 points  What month? 0 points 0 points  What time? 0 points 0 points  Count back from 20 0 points 0 points  Months in reverse 0 points 0 points  Repeat phrase 0 points 0 points  Total Score 0 points 0 points    Immunizations Immunization History  Administered Date(s) Administered   Influenza,inj,Quad PF,6+ Mos 01/13/2016, 11/21/2016, 12/14/2018, 12/25/2019, 01/12/2021   Influenza,inj,quad, With Preservative 12/10/2018   Influenza-Unspecified 12/21/2017   PFIZER(Purple Top)SARS-COV-2 Vaccination 04/22/2019, 05/14/2019, 11/23/2019   PNEUMOCOCCAL CONJUGATE-20 10/28/2022   Pfizer Covid-19 Vaccine Bivalent Booster 80yrs & up 01/12/2021   Pneumococcal Polysaccharide-23 03/24/2016   Tdap 12/14/2018   Tetanus 01/13/2008   Zoster Recombinant(Shingrix) 12/14/2018, 03/06/2019    Screening Tests Health Maintenance  Topic Date Due   Lung Cancer Screening  12/29/2020   Colonoscopy  11/13/2021   COVID-19 Vaccine (5 - 2024-25 season) 10/09/2022    INFLUENZA VACCINE  09/08/2023   Medicare Annual Wellness (AWV)  08/01/2024   DTaP/Tdap/Td (2 - Td or Tdap) 12/13/2028   Pneumococcal Vaccine: 50+ Years  Completed   Hepatitis C Screening  Completed   Zoster Vaccines- Shingrix  Completed   Hepatitis B Vaccines  Aged Out   HPV VACCINES  Aged Out   Meningococcal B Vaccine  Aged Out    Health Maintenance  Health Maintenance Due  Topic Date Due   Lung Cancer Screening  12/29/2020   Colonoscopy  11/13/2021   COVID-19 Vaccine (5 - 2024-25 season) 10/09/2022   Health Maintenance Items Addressed: Patient declined health maintenance at this time   Additional Screening:  Vision Screening: Recommended annual ophthalmology exams for early detection of glaucoma and other disorders of the eye. Would you like a referral to an eye doctor? No    Dental Screening: Recommended annual dental exams for proper oral hygiene  Community Resource Referral / Chronic Care Management: CRR required this visit?  No   CCM required this visit?  No   Plan:    I have personally reviewed and noted the following in the patient's chart:   Medical and social history Use of alcohol, tobacco or illicit drugs  Current medications and supplements including opioid prescriptions. Patient is not currently taking opioid prescriptions. Functional ability and status  Nutritional status Physical activity Advanced directives List of other physicians Hospitalizations, surgeries, and ER visits in previous 12 months Vitals Screenings to include cognitive, depression, and falls Referrals and appointments  In addition, I have reviewed and discussed with patient certain preventive protocols, quality metrics, and best practice recommendations. A written personalized care plan for preventive services as well as general preventive health recommendations were provided to patient.   Lyle MARLA Right, [place]   08/02/2023   After Visit Summary: (MyChart) Due to this being a  telephonic visit, the after visit summary with patients personalized plan was offered to patient via MyChart   Notes: Nothing significant to report at this time.

## 2023-08-08 ENCOUNTER — Encounter (HOSPITAL_BASED_OUTPATIENT_CLINIC_OR_DEPARTMENT_OTHER): Attending: Internal Medicine | Admitting: Internal Medicine

## 2023-08-08 DIAGNOSIS — W228XXA Striking against or struck by other objects, initial encounter: Secondary | ICD-10-CM | POA: Insufficient documentation

## 2023-08-08 DIAGNOSIS — T798XXA Other early complications of trauma, initial encounter: Secondary | ICD-10-CM | POA: Diagnosis not present

## 2023-08-08 DIAGNOSIS — J449 Chronic obstructive pulmonary disease, unspecified: Secondary | ICD-10-CM | POA: Diagnosis not present

## 2023-08-08 DIAGNOSIS — I87311 Chronic venous hypertension (idiopathic) with ulcer of right lower extremity: Secondary | ICD-10-CM | POA: Diagnosis not present

## 2023-08-08 DIAGNOSIS — L97812 Non-pressure chronic ulcer of other part 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: Secondary | ICD-10-CM | POA: Diagnosis not present

## 2023-08-08 DIAGNOSIS — I5032 Chronic diastolic (congestive) heart failure: Secondary | ICD-10-CM | POA: Insufficient documentation

## 2023-08-08 DIAGNOSIS — F17210 Nicotine dependence, cigarettes, uncomplicated: Secondary | ICD-10-CM | POA: Insufficient documentation

## 2023-08-09 DIAGNOSIS — H2513 Age-related nuclear cataract, bilateral: Secondary | ICD-10-CM | POA: Diagnosis not present

## 2023-08-09 DIAGNOSIS — H43811 Vitreous degeneration, right eye: Secondary | ICD-10-CM | POA: Diagnosis not present

## 2023-08-09 DIAGNOSIS — H353133 Nonexudative age-related macular degeneration, bilateral, advanced atrophic without subfoveal involvement: Secondary | ICD-10-CM | POA: Diagnosis not present

## 2023-08-09 DIAGNOSIS — H353211 Exudative age-related macular degeneration, right eye, with active choroidal neovascularization: Secondary | ICD-10-CM | POA: Diagnosis not present

## 2023-08-09 DIAGNOSIS — H35373 Puckering of macula, bilateral: Secondary | ICD-10-CM | POA: Diagnosis not present

## 2023-08-16 ENCOUNTER — Encounter (HOSPITAL_BASED_OUTPATIENT_CLINIC_OR_DEPARTMENT_OTHER): Admitting: Internal Medicine

## 2023-08-16 DIAGNOSIS — I87311 Chronic venous hypertension (idiopathic) with ulcer of right lower extremity: Secondary | ICD-10-CM | POA: Diagnosis not present

## 2023-08-16 DIAGNOSIS — L97812 Non-pressure chronic ulcer of other part 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: Secondary | ICD-10-CM

## 2023-08-16 DIAGNOSIS — T798XXA Other early complications of trauma, initial encounter: Secondary | ICD-10-CM

## 2023-08-23 ENCOUNTER — Encounter (HOSPITAL_BASED_OUTPATIENT_CLINIC_OR_DEPARTMENT_OTHER): Admitting: Internal Medicine

## 2023-08-23 DIAGNOSIS — T798XXA Other early complications of trauma, initial encounter: Secondary | ICD-10-CM | POA: Diagnosis not present

## 2023-08-23 DIAGNOSIS — I87311 Chronic venous hypertension (idiopathic) with ulcer of right lower extremity: Secondary | ICD-10-CM

## 2023-08-23 DIAGNOSIS — L97812 Non-pressure chronic ulcer of other part 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: Secondary | ICD-10-CM

## 2023-09-06 ENCOUNTER — Ambulatory Visit (AMBULATORY_SURGERY_CENTER)

## 2023-09-06 ENCOUNTER — Encounter: Payer: Self-pay | Admitting: Internal Medicine

## 2023-09-06 VITALS — Ht 71.0 in | Wt 250.0 lb

## 2023-09-06 DIAGNOSIS — Z860100 Personal history of colon polyps, unspecified: Secondary | ICD-10-CM

## 2023-09-06 MED ORDER — NA SULFATE-K SULFATE-MG SULF 17.5-3.13-1.6 GM/177ML PO SOLN: 1.0000 | Freq: Once | ORAL | 0 refills | Status: AC

## 2023-09-06 NOTE — Progress Notes (Signed)
 No egg or soy allergy known to patient  No issues known to pt with past sedation with any surgeries or procedures Patient denies ever being told they had issues or difficulty with intubation  No FH of Malignant Hyperthermia Pt is not on diet pills Pt is not on  home 02  Pt is not on blood thinners  Pt denies issues with constipation  Has A fib and takes metoprolol , No Blood thinner.  No flutter Have any cardiac testing pending--no Pt can ambulate independently Pt denies use of chewing tobacco Discussed diabetic I weight loss medication holds Discussed NSAID holds Checked BMI Pt instructed to use Singlecare.com or GoodRx for a price reduction on prep  Patient's chart reviewed by Norleen Schillings CNRA prior to previsit and patient appropriate for the [facility].  Pre visit completed and red dot placed by patient's name on their procedure day (on provider's schedule).

## 2023-09-20 ENCOUNTER — Ambulatory Visit: Admitting: Internal Medicine

## 2023-09-20 ENCOUNTER — Encounter: Payer: Self-pay | Admitting: Internal Medicine

## 2023-09-20 VITALS — BP 117/67 | HR 68 | Temp 97.5°F | Resp 20 | Ht 71.0 in | Wt 250.0 lb

## 2023-09-20 DIAGNOSIS — G4730 Sleep apnea, unspecified: Secondary | ICD-10-CM | POA: Diagnosis not present

## 2023-09-20 DIAGNOSIS — Z1211 Encounter for screening for malignant neoplasm of colon: Secondary | ICD-10-CM | POA: Diagnosis not present

## 2023-09-20 DIAGNOSIS — K648 Other hemorrhoids: Secondary | ICD-10-CM

## 2023-09-20 DIAGNOSIS — E785 Hyperlipidemia, unspecified: Secondary | ICD-10-CM | POA: Diagnosis not present

## 2023-09-20 DIAGNOSIS — Z860100 Personal history of colon polyps, unspecified: Secondary | ICD-10-CM

## 2023-09-20 DIAGNOSIS — D123 Benign neoplasm of transverse colon: Secondary | ICD-10-CM | POA: Diagnosis not present

## 2023-09-20 DIAGNOSIS — Z860101 Personal history of adenomatous and serrated colon polyps: Secondary | ICD-10-CM | POA: Diagnosis not present

## 2023-09-20 DIAGNOSIS — D122 Benign neoplasm of ascending colon: Secondary | ICD-10-CM

## 2023-09-20 DIAGNOSIS — I10 Essential (primary) hypertension: Secondary | ICD-10-CM | POA: Diagnosis not present

## 2023-09-20 DIAGNOSIS — Z800 Family history of malignant neoplasm of digestive organs: Secondary | ICD-10-CM | POA: Diagnosis not present

## 2023-09-20 MED ORDER — SODIUM CHLORIDE 0.9 % IV SOLN: 500.0000 mL | Freq: Once | INTRAVENOUS | Status: DC

## 2023-09-20 NOTE — Progress Notes (Signed)
 GASTROENTEROLOGY PROCEDURE H&P NOTE   Primary Care Physician: Frann Mabel Mt, DO    Reason for Procedure:   History of colon polyps  Plan:    Colonoscopy  Patient is appropriate for endoscopic procedure(s) in the ambulatory ([facility]) setting.  The nature of the procedure, as well as the risks, benefits, and alternatives were carefully and thoroughly reviewed with the patient. Ample time for discussion and questions allowed. The patient understood, was satisfied, and agreed to proceed.     HPI: Mitchell Parrish is a 68 y.o. male who presents for colonoscopy for history of colon polyps. Denies blood in stools, changes in bowel habits, or unintentional weight loss. Father had rectal cancer at age 51  Colonoscopy 11/14/18: - The perianal and digital rectal examinations were normal. - Four sessile polyps were found in the transverse colon ( 1) , ascending colon ( 1) and cecum ( 2) . The polyps were 6 to 8 mm in size. These polyps were removed with a cold snare. Resection and retrieval were complete. - Internal hemorrhoids were found during retroflexion. The hemorrhoids were small and Grade I ( internal hemorrhoids that do not prolapse) . - The exam was otherwise without abnormality on direct and retroflexion views. Path: Surgical [P], colon, ascending, cecum, transverse, polyp (4) - TUBULAR ADENOMA, NEGATIVE FOR HIGH GRADE DYSPLASIA (X MULTIPLE FRAGMENTS).   Past Medical History:  Diagnosis Date   Acute diastolic CHF (congestive heart failure) (HCC) 05/29/2018   Allergy    Arthritis    left hip   Atrial fibrillation (HCC)    Emphysema lung (HCC)    Hyperlipidemia    Hypertension    Sleep apnea    Spurious polycythemia 12/28/2022   Tubular adenoma of colon 11/2011   Vitamin B12 deficiency (non anemic) 07/05/2023    Past Surgical History:  Procedure Laterality Date   ATRIAL FIBRILLATION ABLATION N/A 04/03/2019   Procedure: ATRIAL FIBRILLATION ABLATION;  Surgeon:  Inocencio Soyla Lunger, MD;  Location: [facility] INVASIVE CV LAB;  Service: Cardiovascular;  Laterality: N/A;   BACK SURGERY     CARDIOVERSION N/A 06/25/2018   Procedure: CARDIOVERSION (CATH LAB);  Surgeon: Waddell Danelle ORN, MD;  Location: [facility] INVASIVE CV LAB;  Service: Cardiovascular;  Laterality: N/A;   HERNIA REPAIR     SPINE SURGERY     cervical fusion c6, c7 (1994), and c3,4,5,6 (2004)   TOTAL KNEE ARTHROPLASTY Right 01/28/2019   Procedure: TOTAL KNEE ARTHROPLASTY;  Surgeon: Melodi Lerner, MD;  Location: [facility];  Service: Orthopedics;  Laterality: Right;   WISDOM TOOTH EXTRACTION      Prior to Admission medications   Medication Sig Start Date End Date Taking? Authorizing Provider  albuterol  (VENTOLIN  HFA) 108 (90 Base) MCG/ACT inhaler Inhale 2 puffs into the lungs every 4 (four) hours as needed for wheezing or shortness of breath. 07/06/23  Yes Wendling, Mabel Mt, DO  aspirin  EC 81 MG tablet Take 81 mg by mouth daily. Swallow whole.   Yes [provider]  co-enzyme Q-10 30 MG capsule Take 200 mg by mouth daily.   Yes [provider]  furosemide  (LASIX ) 40 MG tablet TAKE 1 TABLET BY MOUTH DAILY AS NEEDED 05/04/23  Yes Ursuy, Renee Lynn, PA-C  INCRUSE ELLIPTA  62.5 MCG/ACT AEPB INHALE 1 PUFF INTO THE LUNGS DAILY 07/07/23  Yes Frann Mabel Mt, DO  losartan  (COZAAR ) 100 MG tablet Take 1 tablet (100 mg total) by mouth daily. 02/03/23  Yes Leverne Charlies Helling, PA-C  metoprolol  succinate (TOPROL -XL) 25  MG 24 hr tablet Take 1 tablet (25 mg total) by mouth 2 (two) times daily. 02/03/23  Yes Leverne Charlies Helling, PA-C  potassium chloride  (KLOR-CON ) 10 MEQ tablet TAKE 1 TABLET BY MOUTH DAILY AS NEEDED WITH LASIX  05/04/23  Yes Leverne Charlies Helling, PA-C  rosuvastatin  (CRESTOR ) 20 MG tablet Take 1 tablet (20 mg total) by mouth daily. 11/22/22  Yes Frann Mabel Mt, DO  SYMBICORT  160-4.5 MCG/ACT inhaler Inhale 1 puff into the lungs 2 (two) times daily. 07/07/23  Yes Wendling, Mabel Mt, DO  Avacincaptad Pegol (IZERVAY IZ) Inject as directed.    [provider]  Faricimab -svoa (VABYSMO  IZ) Inject as directed.    [provider]  fluticasone  (FLONASE ) 50 MCG/ACT nasal spray Place 2 sprays into both nostrils as needed for allergies or rhinitis. 12/14/18   Frann Mabel Mt, DO    Current Outpatient Medications  Medication Sig Dispense Refill   albuterol  (VENTOLIN  HFA) 108 (90 Base) MCG/ACT inhaler Inhale 2 puffs into the lungs every 4 (four) hours as needed for wheezing or shortness of breath. 18 g 5   aspirin  EC 81 MG tablet Take 81 mg by mouth daily. Swallow whole.     co-enzyme Q-10 30 MG capsule Take 200 mg by mouth daily.     furosemide  (LASIX ) 40 MG tablet TAKE 1 TABLET BY MOUTH DAILY AS NEEDED 90 tablet 2   INCRUSE ELLIPTA  62.5 MCG/ACT AEPB INHALE 1 PUFF INTO THE LUNGS DAILY 30 each 5   losartan  (COZAAR ) 100 MG tablet Take 1 tablet (100 mg total) by mouth daily. 90 tablet 3   metoprolol  succinate (TOPROL -XL) 25 MG 24 hr tablet Take 1 tablet (25 mg total) by mouth 2 (two) times daily. 180 tablet 3   potassium chloride  (KLOR-CON ) 10 MEQ tablet TAKE 1 TABLET BY MOUTH DAILY AS NEEDED WITH LASIX  90 tablet 2   rosuvastatin  (CRESTOR ) 20 MG tablet Take 1 tablet (20 mg total) by mouth daily. 90 tablet 3   SYMBICORT  160-4.5 MCG/ACT inhaler Inhale 1 puff into the lungs 2 (two) times daily. 10.2 g 5   Avacincaptad Pegol (IZERVAY IZ) Inject as directed.     Faricimab -svoa (VABYSMO  IZ) Inject as directed.     fluticasone  (FLONASE ) 50 MCG/ACT nasal spray Place 2 sprays into both nostrils as needed for allergies or rhinitis. 16 g 5   Current Facility-Administered Medications  Medication Dose Route Frequency Provider Last Rate Last Admin   0.9 %  sodium chloride  infusion  500 mL Intravenous Once Romulo Okray C, MD        Allergies as of 09/20/2023 - Review Complete 09/20/2023  Allergen Reaction Noted   Lodine [etodolac] Hives 01/22/2011    Family  History  Problem Relation Age of Onset   Diabetes Mother    Lung cancer Mother 42   Rectal cancer Father    Colon polyps Father    Diabetes Father    Heart disease Father    Colon cancer Father 76       rectal cancer   Esophageal cancer Neg Hx    Stomach cancer Neg Hx     Social History   Socioeconomic History   Marital status: Married    Spouse name: Not on file   Number of children: 4   Years of education: Not on file   Highest education level: 12th grade  Occupational History   Occupation: Warehouse    Employer: [employer]  Tobacco Use   Smoking status: Some Days  Current packs/day: 0.50    Average packs/day: 0.5 packs/day for 45.0 years (22.5 ttl pk-yrs)    Types: Cigarettes   Smokeless tobacco: Never  Vaping Use   Vaping status: Never Used  Substance and Sexual Activity   Alcohol use: Yes    Alcohol/week: 1.0 - 2.0 standard drink of alcohol    Types: 1 - 2 Cans of beer per week    Comment: 2-3 drinks/ day   Drug use: No   Sexual activity: Yes  Other Topics Concern   Not on file  Social History Narrative   Not on file   Social Drivers of Health   Financial Resource Strain: Low Risk  (07/27/2022)   Overall Financial Resource Strain (CARDIA)    Difficulty of Paying Living Expenses: Not hard at all  Food Insecurity: No Food Insecurity (08/02/2023)   Hunger Vital Sign    Worried About Running Out of Food in the Last Year: Never true    Ran Out of Food in the Last Year: Never true  Transportation Needs: No Transportation Needs (08/02/2023)   PRAPARE - Administrator, Civil Service (Medical): No    Lack of Transportation (Non-Medical): No  Physical Activity: Insufficiently Active (08/02/2023)   Exercise Vital Sign    Days of Exercise per Week: 5 days    Minutes of Exercise per Session: 20 min  Stress: No Stress Concern Present (08/02/2023)   [employer] of Occupational Health - Occupational Stress Questionnaire    Feeling of Stress: Not at  all  Social Connections: Unknown (08/02/2023)   Social Connection and Isolation Panel    Frequency of Communication with Friends and Family: Patient declined    Frequency of Social Gatherings with Friends and Family: Patient declined    Attends Religious Services: Patient declined    Database administrator or Organizations: Patient declined    Attends Banker Meetings: Not on file    Marital Status: Married  Intimate Partner Violence: Not At Risk (08/02/2023)   Humiliation, Afraid, Rape, and Kick questionnaire    Fear of Current or Ex-Partner: No    Emotionally Abused: No    Physically Abused: No    Sexually Abused: No    Physical Exam: Vital signs in last 24 hours: BP (!) 156/80   Pulse 76   Temp (!) 97.5 F (36.4 C)   Ht 5' 11 (1.803 m)   Wt 250 lb (113.4 kg)   SpO2 94%   BMI 34.87 kg/m  GEN: NAD EYE: Sclerae anicteric ENT: MMM CV: Non-tachycardic Pulm: No increased work of breathing GI: Soft, NT/ND NEURO:  Alert & Oriented   Estefana Kidney, MD [facility]  09/20/2023 10:05 AM

## 2023-09-20 NOTE — Progress Notes (Signed)
 Pt sedate, gd SR's, VSS, report to RN

## 2023-09-20 NOTE — Op Note (Addendum)
 [facility] Patient Name: Mitchell Parrish Procedure Date: 09/20/2023 10:13 AM MRN: [id number] Endoscopist: Rosario Estefana Kidney , , [id number] Age: 68 Referring MD:  Date of Birth: Jan 12, 1956 Gender: Male Account #: [id number] Procedure:                Colonoscopy Indications:              Screening patient at increased risk: Family history                            of 1st-degree relative with colorectal cancer at                            age 70 years (or older), High risk colon cancer                            surveillance: Personal history of colonic polyps Medicines:                Monitored Anesthesia Care Procedure:                Pre-Anesthesia Assessment:                           - Prior to the procedure, a History and Physical                            was performed, and patient medications and                            allergies were reviewed. The patient's tolerance of                            previous anesthesia was also reviewed. The risks                            and benefits of the procedure and the sedation                            options and risks were discussed with the patient.                            All questions were answered, and informed consent                            was obtained. Prior Anticoagulants: The patient has                            taken no anticoagulant or antiplatelet agents. ASA                            Grade Assessment: II - A patient with mild systemic                            disease. After reviewing the risks and benefits,  the patient was deemed in satisfactory condition to                            undergo the procedure.                           After obtaining informed consent, the colonoscope                            was passed under direct vision. Throughout the                            procedure, the patient's blood pressure, pulse, and                            oxygen  saturations were monitored continuously. The                            Olympus CF-HQ190L ([id number]) Colonoscope was                            introduced through the anus and advanced to the the                            terminal ileum. The colonoscopy was performed                            without difficulty. The patient tolerated the                            procedure well. The quality of the bowel                            preparation was excellent. The terminal ileum,                            ileocecal valve, appendiceal orifice, and rectum                            were photographed. Scope In: 10:25:14 AM Scope Out: 10:42:41 AM Scope Withdrawal Time: 0 hours 15 minutes 1 second  Total Procedure Duration: 0 hours 17 minutes 27 seconds  Findings:                 The terminal ileum appeared normal.                           Four sessile polyps were found in the transverse                            colon and cecum. The polyps were 3 to 6 mm in size.                            These polyps were removed with a cold snare.  Resection and retrieval were complete.                           Non-bleeding internal hemorrhoids were found during                            retroflexion. Complications:            No immediate complications. Estimated Blood Loss:     Estimated blood loss was minimal. Impression:               - The examined portion of the ileum was normal.                           - Four 3 to 6 mm polyps in the transverse colon and                            in the cecum, removed with a cold snare. Resected                            and retrieved.                           - Non-bleeding internal hemorrhoids. Recommendation:           - Discharge patient to home (with escort).                           - Await pathology results.                           - The findings and recommendations were discussed                            with the  patient. Dr Estefana Federico Rosario Estefana Federico,  09/20/2023 10:53:55 AM

## 2023-09-20 NOTE — Progress Notes (Signed)
 Called to room to assist during endoscopic procedure.  Patient ID and intended procedure confirmed with present staff. Received instructions for my participation in the procedure from the performing physician.

## 2023-09-20 NOTE — Progress Notes (Signed)
 Pt's states no medical or surgical changes since previsit or office visit.

## 2023-09-20 NOTE — Patient Instructions (Signed)

## 2023-09-21 ENCOUNTER — Telehealth: Payer: Self-pay

## 2023-09-21 NOTE — Telephone Encounter (Signed)
 Pt at work, spouse [place] answered call. Follow up Call-     09/20/2023    9:40 AM  Call back number  Post procedure Call Back phone  # [phone]  Permission to leave phone message Yes     Patient questions:  Do you have a fever, pain , or abdominal swelling? No. Pain Score  0 *  Have you tolerated food without any problems? Yes  Have you been able to return to your normal activities? Yes  Do you have any questions about your discharge instructions: Diet   No. Medications  No. Follow up visit  No.  Do you have questions or concerns about your Care? No.  Actions: * If pain score is 4 or above: No action needed, pain <4.

## 2023-09-22 ENCOUNTER — Ambulatory Visit: Payer: Self-pay | Admitting: Internal Medicine

## 2023-09-22 LAB — SURGICAL PATHOLOGY

## 2023-10-04 DIAGNOSIS — H353211 Exudative age-related macular degeneration, right eye, with active choroidal neovascularization: Secondary | ICD-10-CM | POA: Diagnosis not present

## 2023-10-04 DIAGNOSIS — H35373 Puckering of macula, bilateral: Secondary | ICD-10-CM | POA: Diagnosis not present

## 2023-10-04 DIAGNOSIS — H353113 Nonexudative age-related macular degeneration, right eye, advanced atrophic without subfoveal involvement: Secondary | ICD-10-CM | POA: Diagnosis not present

## 2023-10-04 DIAGNOSIS — H2513 Age-related nuclear cataract, bilateral: Secondary | ICD-10-CM | POA: Diagnosis not present

## 2023-10-04 DIAGNOSIS — H353133 Nonexudative age-related macular degeneration, bilateral, advanced atrophic without subfoveal involvement: Secondary | ICD-10-CM | POA: Diagnosis not present

## 2023-10-04 DIAGNOSIS — H43811 Vitreous degeneration, right eye: Secondary | ICD-10-CM | POA: Diagnosis not present

## 2023-10-20 DIAGNOSIS — H353211 Exudative age-related macular degeneration, right eye, with active choroidal neovascularization: Secondary | ICD-10-CM | POA: Diagnosis not present

## 2023-11-01 NOTE — Progress Notes (Signed)
 Mitchell Parrish                                          MRN: [id number]   11/01/2023   The [facility] Quality Team Specialist reviewed this patient medical record for the purposes of chart review for care gap closure. The following were reviewed: chart review for care gap closure-controlling blood pressure. Last CBP out of range. Need qualifying BP under 140/90 for gap closure.    [facility] Quality Team

## 2023-11-06 ENCOUNTER — Encounter: Payer: Self-pay | Admitting: Hematology & Oncology

## 2023-11-06 ENCOUNTER — Other Ambulatory Visit: Payer: Self-pay

## 2023-11-06 ENCOUNTER — Inpatient Hospital Stay: Attending: Hematology & Oncology

## 2023-11-06 ENCOUNTER — Inpatient Hospital Stay: Admitting: Hematology & Oncology

## 2023-11-06 VITALS — BP 159/69 | HR 72 | Temp 97.9°F | Resp 18 | Ht 71.0 in | Wt 255.0 lb

## 2023-11-06 DIAGNOSIS — Z7982 Long term (current) use of aspirin: Secondary | ICD-10-CM | POA: Diagnosis not present

## 2023-11-06 DIAGNOSIS — E538 Deficiency of other specified B group vitamins: Secondary | ICD-10-CM | POA: Diagnosis not present

## 2023-11-06 DIAGNOSIS — D751 Secondary polycythemia: Secondary | ICD-10-CM | POA: Diagnosis not present

## 2023-11-06 LAB — CBC WITH DIFFERENTIAL (CANCER CENTER ONLY)
Abs Immature Granulocytes: 0.03 K/uL (ref 0.00–0.07)
Basophils Absolute: 0 K/uL (ref 0.0–0.1)
Basophils Relative: 1 %
Eosinophils Absolute: 0.2 K/uL (ref 0.0–0.5)
Eosinophils Relative: 3 %
HCT: 44.5 % (ref 39.0–52.0)
Hemoglobin: 14.9 g/dL (ref 13.0–17.0)
Immature Granulocytes: 0 %
Lymphocytes Relative: 24 %
Lymphs Abs: 2.1 K/uL (ref 0.7–4.0)
MCH: 31.2 pg (ref 26.0–34.0)
MCHC: 33.5 g/dL (ref 30.0–36.0)
MCV: 93.1 fL (ref 80.0–100.0)
Monocytes Absolute: 0.6 K/uL (ref 0.1–1.0)
Monocytes Relative: 6 %
Neutro Abs: 5.8 K/uL (ref 1.7–7.7)
Neutrophils Relative %: 66 %
Platelet Count: 225 K/uL (ref 150–400)
RBC: 4.78 MIL/uL (ref 4.22–5.81)
RDW: 14.1 % (ref 11.5–15.5)
WBC Count: 8.7 K/uL (ref 4.0–10.5)
nRBC: 0 % (ref 0.0–0.2)

## 2023-11-06 LAB — CMP (CANCER CENTER ONLY)
ALT: 25 U/L (ref 0–44)
AST: 23 U/L (ref 15–41)
Albumin: 4.7 g/dL (ref 3.5–5.0)
Alkaline Phosphatase: 94 U/L (ref 38–126)
Anion gap: 13 (ref 5–15)
BUN: 8 mg/dL (ref 8–23)
CO2: 26 mmol/L (ref 22–32)
Calcium: 9.6 mg/dL (ref 8.9–10.3)
Chloride: 103 mmol/L (ref 98–111)
Creatinine: 0.87 mg/dL (ref 0.61–1.24)
GFR, Estimated: >60 mL/min (ref >60)
Glucose, Bld: 100 mg/dL — ABNORMAL HIGH (ref 70–99)
Potassium: 4.1 mmol/L (ref 3.5–5.1)
Sodium: 141 mmol/L (ref 135–145)
Total Bilirubin: 1.1 mg/dL (ref 0.0–1.2)
Total Protein: 7.9 g/dL (ref 6.5–8.1)

## 2023-11-06 LAB — IRON AND IRON BINDING CAPACITY (CC-WL,HP ONLY)
Iron: 68 ug/dL (ref 45–182)
Saturation Ratios: 15 % — ABNORMAL LOW (ref 17.9–39.5)
TIBC: 445 ug/dL (ref 250–450)
UIBC: 377 ug/dL

## 2023-11-06 LAB — FERRITIN: Ferritin: 43 ng/mL (ref 24–336)

## 2023-11-06 LAB — VITAMIN B12: Vitamin B-12: 289 pg/mL (ref 180–914)

## 2023-11-06 NOTE — Progress Notes (Signed)
 Hematology and Oncology Follow Up Visit  Mitchell Parrish [id number] 07/28/1955 68 y.o. 11/06/2023   Principle Diagnosis:  Spurious polycythemia Vitamin B12 deficiency  Current Therapy:   Patient is donating blood to the [facility] EC ASA 81 mg p.o. daily Vitamin B12 5000 mcg orally daily     Interim History:  Mitchell Parrish is back for follow-up.  He is doing okay.  We last saw him back in May.  He had a good summer.  He has been going to the [employer] every 3 months to donate blood.  This has been incredibly helpful for he and the community.  His left leg looks a lot better.  He had a wound on this area more last saw him.  He has had no problems with vitamin B12.  When we last saw him, his vitamin B-12 level was 674 pg/dL.  His last iron studies that were done back in May showed a ferritin of 176 with an iron saturation of 22%.  He has had no fever.  He has had no exposure to COVID.  He has had no change in bowel or bladder habits.  He has had no rashes.  He has had no cough or shortness of breath.  Mitchell Parrish is going to retire in a couple of weeks.  He is looking forward to this.  Over the holidays, Mitchell Parrish will come in.  Overall, I will have to set his performance status of prior ECOG 1.   Medications:  Current Outpatient Medications:    albuterol  (VENTOLIN  HFA) 108 (90 Base) MCG/ACT inhaler, Inhale 2 puffs into the lungs every 4 (four) hours as needed for wheezing or shortness of breath., Disp: 18 g, Rfl: 5   aspirin  EC 81 MG tablet, Take 81 mg by mouth daily. Swallow whole., Disp: , Rfl:    Avacincaptad Pegol (IZERVAY IZ), Inject as directed., Disp: , Rfl:    co-enzyme Q-10 30 MG capsule, Take 200 mg by mouth daily., Disp: , Rfl:    Faricimab -svoa (VABYSMO  IZ), Inject as directed., Disp: , Rfl:    fluticasone  (FLONASE ) 50 MCG/ACT nasal spray, Place 2 sprays into both nostrils as needed for allergies or rhinitis., Disp: 16 g, Rfl: 5   furosemide  (LASIX ) 40 MG tablet, TAKE 1 TABLET  BY MOUTH DAILY AS NEEDED, Disp: 90 tablet, Rfl: 2   INCRUSE ELLIPTA  62.5 MCG/ACT AEPB, INHALE 1 PUFF INTO THE LUNGS DAILY, Disp: 30 each, Rfl: 5   losartan  (COZAAR ) 100 MG tablet, Take 1 tablet (100 mg total) by mouth daily., Disp: 90 tablet, Rfl: 3   metoprolol  succinate (TOPROL -XL) 25 MG 24 hr tablet, Take 1 tablet (25 mg total) by mouth 2 (two) times daily., Disp: 180 tablet, Rfl: 3   potassium chloride  (KLOR-CON ) 10 MEQ tablet, TAKE 1 TABLET BY MOUTH DAILY AS NEEDED WITH LASIX , Disp: 90 tablet, Rfl: 2   rosuvastatin  (CRESTOR ) 20 MG tablet, Take 1 tablet (20 mg total) by mouth daily., Disp: 90 tablet, Rfl: 3   SYMBICORT  160-4.5 MCG/ACT inhaler, Inhale 1 puff into the lungs 2 (two) times daily., Disp: 10.2 g, Rfl: 5  Allergies:  Allergies  Allergen Reactions   Lodine [Etodolac] Hives    Past Medical History, Surgical history, Social history, and Mitchell Parrish History were reviewed and updated.  Review of Systems: Review of Systems  Constitutional: Negative.   HENT:  Negative.    Eyes: Negative.   Respiratory: Negative.    Cardiovascular: Negative.   Gastrointestinal: Negative.   Endocrine: Negative.  Genitourinary: Negative.    Musculoskeletal: Negative.   Skin: Negative.   Neurological: Negative.   Hematological: Negative.   Psychiatric/Behavioral: Negative.      Physical Exam:  height is 5' 11 (1.803 m) and weight is 255 lb (115.7 kg). His oral temperature is 97.9 F (36.6 C). His blood pressure is 159/69 (abnormal) and his pulse is 72. His respiration is 18 and oxygen saturation is 97%.   Wt Readings from Last 3 Encounters:  11/06/23 255 lb (115.7 kg)  09/20/23 250 lb (113.4 kg)  09/06/23 250 lb (113.4 kg)    Physical Exam Vitals reviewed.  HENT:     Head: Normocephalic and atraumatic.  Eyes:     Pupils: Pupils are equal, round, and reactive to light.  Cardiovascular:     Rate and Rhythm: Normal rate and regular rhythm.     Heart sounds: Normal heart sounds.   Pulmonary:     Effort: Pulmonary effort is normal.     Breath sounds: Normal breath sounds.  Abdominal:     General: Bowel sounds are normal.     Palpations: Abdomen is soft.  Musculoskeletal:        General: No tenderness or deformity. Normal range of motion.     Cervical back: Normal range of motion.  Lymphadenopathy:     Cervical: No cervical adenopathy.  Skin:    General: Skin is warm and dry.     Findings: No erythema or rash.  Neurological:     Mental Status: He is alert and oriented to person, place, and time.  Psychiatric:        Behavior: Behavior normal.        Thought Content: Thought content normal.        Judgment: Judgment normal.      Lab Results  Component Value Date   WBC 8.7 11/06/2023   HGB 14.9 11/06/2023   HCT 44.5 11/06/2023   MCV 93.1 11/06/2023   PLT 225 11/06/2023     Chemistry      Component Value Date/Time   NA 139 07/05/2023 1206   NA 140 03/18/2019 0857   K 4.2 07/05/2023 1206   CL 102 07/05/2023 1206   CO2 29 07/05/2023 1206   BUN 12 07/05/2023 1206   BUN 10 03/18/2019 0857   CREATININE 0.96 07/05/2023 1206   CREATININE 0.97 10/28/2022 1518      Component Value Date/Time   CALCIUM  9.3 07/05/2023 1206   ALKPHOS 85 07/05/2023 1206   AST 17 07/05/2023 1206   ALT 20 07/05/2023 1206   BILITOT 1.3 (H) 07/05/2023 1206      Impression and Plan: Mitchell Parrish is a very nice 68 year old white male.  He has polycythemia.  Again this is spurious or secondary polycythemia.  He does not have a JAK2 mutation.  He is responding well to his donations.  Again he can donate every 3 months for my opinion.  I think we can now get him through the Holiday season.  I think this would certainly be helpful for him.  I know that he will enjoy his retirement.    Maude JONELLE Crease, MD 9/29/202512:36 PM

## 2023-12-01 DIAGNOSIS — H2513 Age-related nuclear cataract, bilateral: Secondary | ICD-10-CM | POA: Diagnosis not present

## 2023-12-01 DIAGNOSIS — H353133 Nonexudative age-related macular degeneration, bilateral, advanced atrophic without subfoveal involvement: Secondary | ICD-10-CM | POA: Diagnosis not present

## 2023-12-01 DIAGNOSIS — H353211 Exudative age-related macular degeneration, right eye, with active choroidal neovascularization: Secondary | ICD-10-CM | POA: Diagnosis not present

## 2023-12-01 DIAGNOSIS — H35373 Puckering of macula, bilateral: Secondary | ICD-10-CM | POA: Diagnosis not present

## 2023-12-01 DIAGNOSIS — H43811 Vitreous degeneration, right eye: Secondary | ICD-10-CM | POA: Diagnosis not present

## 2023-12-14 ENCOUNTER — Other Ambulatory Visit: Payer: Self-pay | Admitting: Family Medicine

## 2023-12-14 DIAGNOSIS — J42 Unspecified chronic bronchitis: Secondary | ICD-10-CM

## 2023-12-18 ENCOUNTER — Encounter: Payer: Self-pay | Admitting: Family Medicine

## 2023-12-18 ENCOUNTER — Ambulatory Visit: Admitting: Family Medicine

## 2023-12-18 VITALS — BP 134/74 | HR 89 | Temp 98.0°F | Resp 16 | Ht 71.0 in | Wt 263.2 lb

## 2023-12-18 DIAGNOSIS — Z23 Encounter for immunization: Secondary | ICD-10-CM

## 2023-12-18 DIAGNOSIS — Z0000 Encounter for general adult medical examination without abnormal findings: Secondary | ICD-10-CM

## 2023-12-18 DIAGNOSIS — J42 Unspecified chronic bronchitis: Secondary | ICD-10-CM | POA: Diagnosis not present

## 2023-12-18 DIAGNOSIS — Z122 Encounter for screening for malignant neoplasm of respiratory organs: Secondary | ICD-10-CM

## 2023-12-18 DIAGNOSIS — Z125 Encounter for screening for malignant neoplasm of prostate: Secondary | ICD-10-CM | POA: Diagnosis not present

## 2023-12-18 DIAGNOSIS — E782 Mixed hyperlipidemia: Secondary | ICD-10-CM | POA: Diagnosis not present

## 2023-12-18 MED ORDER — TRAMADOL HCL 50 MG PO TABS: 50.0000 mg | ORAL_TABLET | Freq: Three times a day (TID) | ORAL | 0 refills | Status: AC | PRN

## 2023-12-18 NOTE — Progress Notes (Signed)
 Chief Complaint  Patient presents with   Annual Exam    CPE    Well Male Mitchell Parrish is here for a complete physical.   His last physical was >1 year ago.  Current diet: in general, a healthy diet.   Current exercise: walking Weight trend: stable Fatigue out of ordinary? No. Seat belt? Yes.   Advanced directive? No  Health maintenance Shingrix- Yes Colonoscopy- Yes Tetanus- Yes Hep C- Yes Lung cancer screening- Due Pneumonia vaccine- Yes AAA screening- Yes  COPD History of COPD on Symbicort  160-4.5 mcg twice daily, Incruse 1 puff daily, albuterol  as needed.  Breathing is okay overall.  He is still smoking.  Does have some coughing.  No wheezing or recent hospitalizations.  Hyperlipidemia Patient presents for mixed hyperlipidemia follow up. Currently being treated with Crestor  20 mg daily and compliance with treatment thus far has been good. He denies myalgias. Diet/exercise as above. The patient is not known to have coexisting coronary artery disease.  Past Medical History:  Diagnosis Date   Acute diastolic CHF (congestive heart failure) (HCC) 05/29/2018   Allergy    Arthritis    left hip   Atrial fibrillation (HCC)    Emphysema lung (HCC)    Hyperlipidemia    Hypertension    Sleep apnea    Spurious polycythemia 12/28/2022   Tubular adenoma of colon 11/2011   Vitamin B12 deficiency (non anemic) 07/05/2023     Past Surgical History:  Procedure Laterality Date   ATRIAL FIBRILLATION ABLATION N/A 04/03/2019   Procedure: ATRIAL FIBRILLATION ABLATION;  Surgeon: Inocencio Soyla Lunger, MD;  Location: [facility] INVASIVE CV LAB;  Service: Cardiovascular;  Laterality: N/A;   BACK SURGERY     CARDIOVERSION N/A 06/25/2018   Procedure: CARDIOVERSION (CATH LAB);  Surgeon: Waddell Danelle ORN, MD;  Location: [facility] INVASIVE CV LAB;  Service: Cardiovascular;  Laterality: N/A;   HERNIA REPAIR     SPINE SURGERY     cervical fusion c6, c7 (1994), and c3,4,5,6 (2004)   TOTAL KNEE  ARTHROPLASTY Right 01/28/2019   Procedure: TOTAL KNEE ARTHROPLASTY;  Surgeon: Melodi Lerner, MD;  Location: [facility] OR[facility];  Service: Orthopedics;  Laterality: Right;   WISDOM TOOTH EXTRACTION      Medications  Current Outpatient Medications on File Prior to Visit  Medication Sig Dispense Refill   albuterol  (VENTOLIN  HFA) 108 (90 Base) MCG/ACT inhaler INHALE 2 PUFFS INTO THE LUNGS EVERY 4 HOURS AS NEEDED FOR WHEEZING OR SHORTNESS OF BREATH 8.5 g 5   aspirin  EC 81 MG tablet Take 81 mg by mouth daily. Swallow whole.     Avacincaptad Pegol (IZERVAY IZ) Inject as directed.     co-enzyme Q-10 30 MG capsule Take 200 mg by mouth daily.     Faricimab -svoa (VABYSMO  IZ) Inject as directed.     fluticasone  (FLONASE ) 50 MCG/ACT nasal spray Place 2 sprays into both nostrils as needed for allergies or rhinitis. 16 g 5   furosemide  (LASIX ) 40 MG tablet TAKE 1 TABLET BY MOUTH DAILY AS NEEDED 90 tablet 2   INCRUSE ELLIPTA  62.5 MCG/ACT AEPB INHALE 1 PUFF INTO THE LUNGS DAILY 30 each 5   losartan  (COZAAR ) 100 MG tablet Take 1 tablet (100 mg total) by mouth daily. 90 tablet 3   metoprolol  succinate (TOPROL -XL) 25 MG 24 hr tablet Take 1 tablet (25 mg total) by mouth 2 (two) times daily. 180 tablet 3   potassium chloride  (KLOR-CON ) 10 MEQ tablet TAKE 1 TABLET BY MOUTH DAILY AS NEEDED WITH LASIX   90 tablet 2   rosuvastatin  (CRESTOR ) 20 MG tablet Take 1 tablet (20 mg total) by mouth daily. 90 tablet 3   SYMBICORT  160-4.5 MCG/ACT inhaler INHALE 1 PUFF INTO THE LUNGS IN THE MORNING AND AT BEDTIME 10.2 g 5    Allergies Allergies  Allergen Reactions   Lodine [Etodolac] Hives    Family History Family History  Problem Relation Age of Onset   Diabetes Mother    Lung cancer Mother 65   Rectal cancer Father    Colon polyps Father    Diabetes Father    Heart disease Father    Colon cancer Father 7       rectal cancer   Esophageal cancer Neg Hx    Stomach cancer Neg Hx     Review of Systems: Constitutional:  no  fevers Eye:  no recent significant change in vision Ears:  No changes in hearing Nose/Mouth/Throat:  no complaints of nasal congestion, no sore throat Cardiovascular: no chest pain Respiratory:  No shortness of breath Gastrointestinal:  No change in bowel habits GU:  No frequency Integumentary:  no abnormal skin lesions reported Neurologic:  no headaches Endocrine:  denies unexplained weight changes  Exam BP 134/74 (BP Location: Left Arm, Cuff Size: Large)   Pulse 89   Temp 98 F (36.7 C) (Oral)   Resp 16   Ht 5' 11 (1.803 m)   Wt 263 lb 3.2 oz (119.4 kg)   SpO2 98%   BMI 36.71 kg/m  General:  well developed, well nourished, in no apparent distress Skin:  no significant moles, warts, or growths Head:  no masses, lesions, or tenderness Eyes:  pupils equal and round, sclera anicteric without injection Ears:  canals without lesions, TMs shiny without retraction, no obvious effusion, no erythema Nose:  nares patent, mucosa normal Throat/Pharynx:  lips and gingiva without lesion; tongue and uvula midline; non-inflamed pharynx; no exudates or postnasal drainage Lungs:  clear to auscultation, breath sounds equal bilaterally, no respiratory distress Cardio:  regular rate and rhythm, no LE edema or bruits Rectal: Deferred GI: BS+, S, NT, ND, no masses or organomegaly Musculoskeletal:  symmetrical muscle groups noted without atrophy or deformity Neuro:  gait normal; deep tendon reflexes normal and symmetric Psych: well oriented with normal range of affect and appropriate judgment/insight  Assessment and Plan  Well adult exam  Chronic bronchitis, unspecified chronic bronchitis type (HCC)  Screening for lung cancer - Plan: CT CHEST LUNG CANCER SCREENING LOW DOSE WO CONTRAST  Need for influenza vaccination - Plan: Flu vaccine HIGH DOSE PF(Fluzone Trivalent)  Mixed hyperlipidemia - Plan: Lipid panel  Screening for prostate cancer - Plan: PSA, Medicare ( [place] only)    Well 68 y.o. male. Counseled on diet and exercise. Advanced directive form provided today.  Flu shot today. Other orders as above. Screen for lung cancer as above. COPD: Chronic, stable.  Continue Symbicort  2 puffs daily, Incruse 1 puff daily, albuterol  as needed. Mixed hyperlipidemia: Chronic, stable.  Continue Crestor  20 mg daily. Follow up in 1 yr.  The patient voiced understanding and agreement to the plan.  Mabel [place], DO 12/18/23 4:20 PM

## 2023-12-18 NOTE — Patient Instructions (Addendum)
 Give us  2-3 business days to get the results of your labs back.   Keep the diet clean and stay active.  Aim to do some physical exertion for 150 minutes per week. This is typically divided into 5 days per week, 30 minutes per day. The activity should be enough to get your heart rate up. Anything is better than nothing if you have time constraints.  Someone will reach out to schedule your lung scan.   Let us  know if you need anything.

## 2023-12-19 ENCOUNTER — Ambulatory Visit: Payer: Self-pay | Admitting: Family Medicine

## 2023-12-19 LAB — LIPID PANEL
Cholesterol: 153 mg/dL (ref 0–200)
HDL: 52.9 mg/dL (ref >39.00)
LDL Cholesterol: 71 mg/dL (ref 0–99)
NonHDL: 100.2
Total CHOL/HDL Ratio: 3
Triglycerides: 146 mg/dL (ref 0.0–149.0)
VLDL: 29.2 mg/dL (ref 0.0–40.0)

## 2023-12-19 LAB — PSA, MEDICARE: PSA: 0.5 ng/mL (ref 0.10–4.00)

## 2023-12-27 ENCOUNTER — Ambulatory Visit (HOSPITAL_BASED_OUTPATIENT_CLINIC_OR_DEPARTMENT_OTHER)
Admission: RE | Admit: 2023-12-27 | Discharge: 2023-12-27 | Disposition: A | Discharge status: Home / Self Care | Source: Ambulatory Visit | Attending: Family Medicine | Admitting: Family Medicine

## 2023-12-27 DIAGNOSIS — J439 Emphysema, unspecified: Secondary | ICD-10-CM | POA: Insufficient documentation

## 2023-12-27 DIAGNOSIS — I700 Atherosclerosis of aorta: Secondary | ICD-10-CM | POA: Diagnosis not present

## 2023-12-27 DIAGNOSIS — Z122 Encounter for screening for malignant neoplasm of respiratory organs: Secondary | ICD-10-CM | POA: Insufficient documentation

## 2023-12-27 DIAGNOSIS — D3501 Benign neoplasm of right adrenal gland: Secondary | ICD-10-CM | POA: Diagnosis not present

## 2023-12-27 DIAGNOSIS — F17210 Nicotine dependence, cigarettes, uncomplicated: Secondary | ICD-10-CM | POA: Insufficient documentation

## 2023-12-27 DIAGNOSIS — K8020 Calculus of gallbladder without cholecystitis without obstruction: Secondary | ICD-10-CM | POA: Insufficient documentation

## 2024-01-12 DIAGNOSIS — H43811 Vitreous degeneration, right eye: Secondary | ICD-10-CM | POA: Diagnosis not present

## 2024-01-12 DIAGNOSIS — H353133 Nonexudative age-related macular degeneration, bilateral, advanced atrophic without subfoveal involvement: Secondary | ICD-10-CM | POA: Diagnosis not present

## 2024-01-12 DIAGNOSIS — H35373 Puckering of macula, bilateral: Secondary | ICD-10-CM | POA: Diagnosis not present

## 2024-01-12 DIAGNOSIS — H353211 Exudative age-related macular degeneration, right eye, with active choroidal neovascularization: Secondary | ICD-10-CM | POA: Diagnosis not present

## 2024-01-12 DIAGNOSIS — H2513 Age-related nuclear cataract, bilateral: Secondary | ICD-10-CM | POA: Diagnosis not present

## 2024-01-27 ENCOUNTER — Other Ambulatory Visit: Payer: Self-pay | Admitting: Physician Assistant

## 2024-01-27 ENCOUNTER — Other Ambulatory Visit: Payer: Self-pay | Admitting: Family Medicine

## 2024-02-16 ENCOUNTER — Inpatient Hospital Stay: Attending: Hematology & Oncology

## 2024-02-16 DIAGNOSIS — Z79899 Other long term (current) drug therapy: Secondary | ICD-10-CM | POA: Insufficient documentation

## 2024-02-16 DIAGNOSIS — Z7982 Long term (current) use of aspirin: Secondary | ICD-10-CM | POA: Insufficient documentation

## 2024-02-16 DIAGNOSIS — F17200 Nicotine dependence, unspecified, uncomplicated: Secondary | ICD-10-CM | POA: Insufficient documentation

## 2024-02-16 DIAGNOSIS — D751 Secondary polycythemia: Secondary | ICD-10-CM | POA: Insufficient documentation

## 2024-02-16 DIAGNOSIS — E538 Deficiency of other specified B group vitamins: Secondary | ICD-10-CM | POA: Insufficient documentation

## 2024-02-16 DIAGNOSIS — Z7951 Long term (current) use of inhaled steroids: Secondary | ICD-10-CM | POA: Insufficient documentation

## 2024-02-16 LAB — CMP (CANCER CENTER ONLY)
ALT: 20 U/L (ref 0–44)
AST: 17 U/L (ref 15–41)
Albumin: 4.4 g/dL (ref 3.5–5.0)
Alkaline Phosphatase: 74 U/L (ref 38–126)
Anion gap: 11 (ref 5–15)
BUN: 15 mg/dL (ref 8–23)
CO2: 24 mmol/L (ref 22–32)
Calcium: 9.2 mg/dL (ref 8.9–10.3)
Chloride: 105 mmol/L (ref 98–111)
Creatinine: 0.85 mg/dL (ref 0.61–1.24)
GFR, Estimated: >60 mL/min
Glucose, Bld: 97 mg/dL (ref 70–99)
Potassium: 4.3 mmol/L (ref 3.5–5.1)
Sodium: 140 mmol/L (ref 135–145)
Total Bilirubin: 0.9 mg/dL (ref 0.0–1.2)
Total Protein: 7 g/dL (ref 6.5–8.1)

## 2024-02-16 LAB — CBC WITH DIFFERENTIAL (CANCER CENTER ONLY)
Abs Immature Granulocytes: 0.03 K/uL (ref 0.00–0.07)
Basophils Absolute: 0.1 K/uL (ref 0.0–0.1)
Basophils Relative: 1 %
Eosinophils Absolute: 0.3 K/uL (ref 0.0–0.5)
Eosinophils Relative: 3 %
HCT: 47.1 % (ref 39.0–52.0)
Hemoglobin: 15.4 g/dL (ref 13.0–17.0)
Immature Granulocytes: 0 %
Lymphocytes Relative: 23 %
Lymphs Abs: 1.9 K/uL (ref 0.7–4.0)
MCH: 30.5 pg (ref 26.0–34.0)
MCHC: 32.7 g/dL (ref 30.0–36.0)
MCV: 93.3 fL (ref 80.0–100.0)
Monocytes Absolute: 0.7 K/uL (ref 0.1–1.0)
Monocytes Relative: 9 %
Neutro Abs: 5.3 K/uL (ref 1.7–7.7)
Neutrophils Relative %: 64 %
Platelet Count: 197 K/uL (ref 150–400)
RBC: 5.05 MIL/uL (ref 4.22–5.81)
RDW: 13.6 % (ref 11.5–15.5)
WBC Count: 8.3 K/uL (ref 4.0–10.5)
nRBC: 0 % (ref 0.0–0.2)

## 2024-02-16 LAB — IRON AND IRON BINDING CAPACITY (CC-WL,HP ONLY)
Iron: 78 ug/dL (ref 45–182)
Saturation Ratios: 20 % (ref 17.9–39.5)
TIBC: 396 ug/dL (ref 250–450)
UIBC: 319 ug/dL

## 2024-02-16 LAB — FERRITIN: Ferritin: 69 ng/mL (ref 24–336)

## 2024-02-16 LAB — VITAMIN B12: Vitamin B-12: 356 pg/mL (ref 180–914)

## 2024-02-16 LAB — LACTATE DEHYDROGENASE: LDH: 172 U/L (ref 105–235)

## 2024-02-19 ENCOUNTER — Inpatient Hospital Stay

## 2024-02-19 ENCOUNTER — Other Ambulatory Visit: Payer: Self-pay

## 2024-02-19 ENCOUNTER — Inpatient Hospital Stay: Admitting: Hematology & Oncology

## 2024-02-19 ENCOUNTER — Encounter: Payer: Self-pay | Admitting: Hematology & Oncology

## 2024-02-19 VITALS — BP 168/82 | HR 72 | Temp 98.1°F | Resp 19 | Ht 71.0 in | Wt 261.0 lb

## 2024-02-19 DIAGNOSIS — D751 Secondary polycythemia: Secondary | ICD-10-CM

## 2024-02-19 DIAGNOSIS — E538 Deficiency of other specified B group vitamins: Secondary | ICD-10-CM

## 2024-02-19 NOTE — Progress Notes (Signed)
 " Hematology and Oncology Follow Up Visit  Mitchell Parrish [id number] Jan 04, 1956 69 y.o. 02/19/2024   Principle Diagnosis:  Spurious polycythemia Vitamin B12 deficiency  Current Therapy:   Patient is donating blood to [facility] ASA 81 mg p.o. daily Vitamin B12 5000 mcg orally daily     Interim History:  Mitchell Parrish is back for follow-up.  We last saw him back in September.  Since then, he has been quite busy.  He had a wonderful holiday season.  All of his family came in from various parts of the country.  They had a wonderful time for Thanksgiving.  He is still smoking.  He probably smokes about half pack a day.  He is trying to cut back.  He donates blood.  He is really doing a good job with donating blood.  His blood type is O+.  He hopefully will be able to donate this week or next week.  When we last saw him, his vitamin B12 level was 356 mcg/dL.  He has had no problems with nausea or vomiting.  He has had no cough or shortness of breath.  He has avoided Influenza and COVID.  His iron studies that were done 3 days ago showed a ferritin of 69 with iron saturation of 20%.  Overall, I would say that his performance status is probably ECOG 0.   Medications:  Current Outpatient Medications:    albuterol  (VENTOLIN  HFA) 108 (90 Base) MCG/ACT inhaler, INHALE 2 PUFFS INTO THE LUNGS EVERY 4 HOURS AS NEEDED FOR WHEEZING OR SHORTNESS OF BREATH, Disp: 8.5 g, Rfl: 5   aspirin  EC 81 MG tablet, Take 81 mg by mouth daily. Swallow whole., Disp: , Rfl:    Avacincaptad Pegol (IZERVAY IZ), Inject as directed., Disp: , Rfl:    co-enzyme Q-10 30 MG capsule, Take 200 mg by mouth daily., Disp: , Rfl:    Faricimab -svoa (VABYSMO  IZ), Inject as directed., Disp: , Rfl:    fluticasone  (FLONASE ) 50 MCG/ACT nasal spray, Place 2 sprays into both nostrils as needed for allergies or rhinitis., Disp: 16 g, Rfl: 5   furosemide  (LASIX ) 40 MG tablet, Take 1 tablet (40 mg total) by mouth daily as needed.  PLEASE MAKE AN APPOINTMENT IN ORDER TO RECEIVE ADDITIONAL REFILLS, FIRST ATTEMPT., Disp: 30 tablet, Rfl: 0   INCRUSE ELLIPTA  62.5 MCG/ACT AEPB, INHALE 1 PUFF INTO THE LUNGS DAILY, Disp: 30 each, Rfl: 5   losartan  (COZAAR ) 100 MG tablet, Take 1 tablet (100 mg total) by mouth daily. PLEASE MAKE AN APPOINTMENT IN ORDER TO RECEIVE ADDITIONAL REFILLS, FIRST ATTEMPT., Disp: 30 tablet, Rfl: 0   metoprolol  succinate (TOPROL -XL) 25 MG 24 hr tablet, Take 1 tablet (25 mg total) by mouth 2 (two) times daily. PLEASE MAKE AN APPOINTMENT IN ORDER TO RECEIVE ADDITIONAL REFILLS, FIRST ATTEMPT., Disp: 60 tablet, Rfl: 0   potassium chloride  (KLOR-CON ) 10 MEQ tablet, TAKE 1 TABLET BY MOUTH DAILY AS NEEDED WITH LASIX  PLEASE MAKE AN APPOINTMENT IN ORDER TO RECEIVE ADDITIONAL REFILLS, FIRST ATTEMPT., Disp: 3 tablet, Rfl: 0   rosuvastatin  (CRESTOR ) 20 MG tablet, TAKE 1 TABLET BY MOUTH DAILY, Disp: 90 tablet, Rfl: 3   SYMBICORT  160-4.5 MCG/ACT inhaler, INHALE 1 PUFF INTO THE LUNGS IN THE MORNING AND AT BEDTIME, Disp: 10.2 g, Rfl: 5   traMADol  (ULTRAM ) 50 MG tablet, Take 1 tablet (50 mg total) by mouth every 8 (eight) hours as needed for moderate pain (pain score 4-6)., Disp: 30 tablet, Rfl: 0  Allergies:  Allergies  Allergen Reactions   Lodine [Etodolac] Hives    Past Medical History, Surgical history, Social history, and Family History were reviewed and updated.  Review of Systems: Review of Systems  Constitutional: Negative.   HENT:  Negative.    Eyes: Negative.   Respiratory: Negative.    Cardiovascular: Negative.   Gastrointestinal: Negative.   Endocrine: Negative.   Genitourinary: Negative.    Musculoskeletal: Negative.   Skin: Negative.   Neurological: Negative.   Hematological: Negative.   Psychiatric/Behavioral: Negative.      Physical Exam:  height is 5' 11 (1.803 m) and weight is 261 lb (118.4 kg). His oral temperature is 98.1 F (36.7 C). His blood pressure is 168/82 (abnormal) and his pulse  is 72. His respiration is 19 and oxygen saturation is 96%.   Wt Readings from Last 3 Encounters:  02/19/24 261 lb (118.4 kg)  12/18/23 263 lb 3.2 oz (119.4 kg)  11/06/23 255 lb (115.7 kg)    Physical Exam Vitals reviewed.  HENT:     Head: Normocephalic and atraumatic.  Eyes:     Pupils: Pupils are equal, round, and reactive to light.  Cardiovascular:     Rate and Rhythm: Normal rate and regular rhythm.     Heart sounds: Normal heart sounds.  Pulmonary:     Effort: Pulmonary effort is normal.     Breath sounds: Normal breath sounds.  Abdominal:     General: Bowel sounds are normal.     Palpations: Abdomen is soft.  Musculoskeletal:        General: No tenderness or deformity. Normal range of motion.     Cervical back: Normal range of motion.  Lymphadenopathy:     Cervical: No cervical adenopathy.  Skin:    General: Skin is warm and dry.     Findings: No erythema or rash.  Neurological:     Mental Status: He is alert and oriented to person, place, and time.  Psychiatric:        Behavior: Behavior normal.        Thought Content: Thought content normal.        Judgment: Judgment normal.      Lab Results  Component Value Date   WBC 8.3 02/16/2024   HGB 15.4 02/16/2024   HCT 47.1 02/16/2024   MCV 93.3 02/16/2024   PLT 197 02/16/2024     Chemistry      Component Value Date/Time   NA 140 02/16/2024 1311   NA 140 03/18/2019 0857   K 4.3 02/16/2024 1311   CL 105 02/16/2024 1311   CO2 24 02/16/2024 1311   BUN 15 02/16/2024 1311   BUN 10 03/18/2019 0857   CREATININE 0.85 02/16/2024 1311   CREATININE 0.97 10/28/2022 1518      Component Value Date/Time   CALCIUM  9.2 02/16/2024 1311   ALKPHOS 74 02/16/2024 1311   AST 17 02/16/2024 1311   ALT 20 02/16/2024 1311   BILITOT 0.9 02/16/2024 1311      Impression and Plan: Mitchell Parrish is a very nice 69 year old white male.  He has polycythemia.  Again this is spurious or secondary polycythemia.  He does not have a  JAK2 mutation.  He is responding well to his donations.  Again he can donate every 3 months for my opinion.  I think we try to move his appointments out little bit longer.  Will have him come back in 4 months now.  Hopefully, he will continue to donate blood.  This really helps out the community and also helps himself out.      Maude JONELLE Crease, MD 1/12/202612:21 PM "

## 2024-06-17 ENCOUNTER — Inpatient Hospital Stay

## 2024-06-17 ENCOUNTER — Inpatient Hospital Stay: Admitting: Family

## 2024-08-06 ENCOUNTER — Ambulatory Visit
